# Patient Record
Sex: Female | Born: 1990
Health system: Southern US, Community
[De-identification: ages and names within clinical notes are randomized; demographics above are authoritative.]

## PROBLEM LIST (undated history)

## (undated) DIAGNOSIS — G8929 Other chronic pain: Secondary | ICD-10-CM

## (undated) DIAGNOSIS — S8010XA Contusion of unspecified lower leg, initial encounter: Secondary | ICD-10-CM

## (undated) DIAGNOSIS — K219 Gastro-esophageal reflux disease without esophagitis: Secondary | ICD-10-CM

## (undated) DIAGNOSIS — N926 Irregular menstruation, unspecified: Secondary | ICD-10-CM

## (undated) DIAGNOSIS — F319 Bipolar disorder, unspecified: Secondary | ICD-10-CM

## (undated) DIAGNOSIS — J45909 Unspecified asthma, uncomplicated: Secondary | ICD-10-CM

## (undated) DIAGNOSIS — Z3169 Encounter for other general counseling and advice on procreation: Secondary | ICD-10-CM

## (undated) DIAGNOSIS — A749 Chlamydial infection, unspecified: Secondary | ICD-10-CM

## (undated) DIAGNOSIS — F411 Generalized anxiety disorder: Secondary | ICD-10-CM

## (undated) DIAGNOSIS — Z309 Encounter for contraceptive management, unspecified: Secondary | ICD-10-CM

## (undated) DIAGNOSIS — G43909 Migraine, unspecified, not intractable, without status migrainosus: Secondary | ICD-10-CM

## (undated) DIAGNOSIS — T7840XA Allergy, unspecified, initial encounter: Secondary | ICD-10-CM

## (undated) DIAGNOSIS — Z973 Presence of spectacles and contact lenses: Secondary | ICD-10-CM

## (undated) DIAGNOSIS — Z8742 Personal history of other diseases of the female genital tract: Secondary | ICD-10-CM

## (undated) DIAGNOSIS — N879 Dysplasia of cervix uteri, unspecified: Secondary | ICD-10-CM

## (undated) DIAGNOSIS — S93402A Sprain of unspecified ligament of left ankle, initial encounter: Secondary | ICD-10-CM

## (undated) DIAGNOSIS — L739 Follicular disorder, unspecified: Secondary | ICD-10-CM

## (undated) DIAGNOSIS — E559 Vitamin D deficiency, unspecified: Secondary | ICD-10-CM

## (undated) DIAGNOSIS — E282 Polycystic ovarian syndrome: Secondary | ICD-10-CM

## (undated) DIAGNOSIS — R109 Unspecified abdominal pain: Secondary | ICD-10-CM

## (undated) DIAGNOSIS — M542 Cervicalgia: Secondary | ICD-10-CM

## (undated) DIAGNOSIS — M545 Low back pain: Secondary | ICD-10-CM

## (undated) DIAGNOSIS — B019 Varicella without complication: Secondary | ICD-10-CM

## (undated) DIAGNOSIS — L309 Dermatitis, unspecified: Secondary | ICD-10-CM

## (undated) DIAGNOSIS — H01139 Eczematous dermatitis of unspecified eye, unspecified eyelid: Secondary | ICD-10-CM

## (undated) DIAGNOSIS — G44221 Chronic tension-type headache, intractable: Secondary | ICD-10-CM

## (undated) DIAGNOSIS — G43009 Migraine without aura, not intractable, without status migrainosus: Secondary | ICD-10-CM

## (undated) DIAGNOSIS — L709 Acne, unspecified: Secondary | ICD-10-CM

## (undated) DIAGNOSIS — O24419 Gestational diabetes mellitus in pregnancy, unspecified control: Secondary | ICD-10-CM

## (undated) DIAGNOSIS — E785 Hyperlipidemia, unspecified: Secondary | ICD-10-CM

## (undated) DIAGNOSIS — J4520 Mild intermittent asthma, uncomplicated: Secondary | ICD-10-CM

## (undated) DIAGNOSIS — N83201 Unspecified ovarian cyst, right side: Secondary | ICD-10-CM

## (undated) DIAGNOSIS — M549 Dorsalgia, unspecified: Secondary | ICD-10-CM

## (undated) DIAGNOSIS — M25561 Pain in right knee: Secondary | ICD-10-CM

## (undated) DIAGNOSIS — K581 Irritable bowel syndrome with constipation: Secondary | ICD-10-CM

## (undated) DIAGNOSIS — Z8619 Personal history of other infectious and parasitic diseases: Secondary | ICD-10-CM

## (undated) DIAGNOSIS — Z8632 Personal history of gestational diabetes: Secondary | ICD-10-CM

## (undated) DIAGNOSIS — G4733 Obstructive sleep apnea (adult) (pediatric): Secondary | ICD-10-CM

## (undated) HISTORY — DX: Cervicalgia: M54.2

## (undated) HISTORY — DX: Encounter for contraceptive management, unspecified: Z30.9

## (undated) HISTORY — DX: Low back pain: M54.5

## (undated) HISTORY — DX: Follicular disorder, unspecified: L73.9

## (undated) HISTORY — DX: Acne, unspecified: L70.9

## (undated) HISTORY — DX: Varicella without complication: B01.9

## (undated) HISTORY — DX: Unspecified abdominal pain: R10.9

## (undated) HISTORY — DX: Sprain of unspecified ligament of left ankle, initial encounter: S93.402A

## (undated) HISTORY — DX: Dysplasia of cervix uteri, unspecified: N87.9

## (undated) HISTORY — DX: Bipolar disorder, unspecified: F31.9

## (undated) HISTORY — DX: Dermatitis, unspecified: L30.9

## (undated) HISTORY — DX: Hyperlipidemia, unspecified: E78.5

## (undated) HISTORY — PX: NO PAST SURGERIES: SHX2092

## (undated) HISTORY — DX: Irregular menstruation, unspecified: N92.6

## (undated) HISTORY — DX: Dorsalgia, unspecified: M54.9

## (undated) HISTORY — DX: Encounter for other general counseling and advice on procreation: Z31.69

## (undated) HISTORY — DX: Eczematous dermatitis of unspecified eye, unspecified eyelid: H01.139

## (undated) HISTORY — DX: Allergy, unspecified, initial encounter: T78.40XA

## (undated) HISTORY — PX: KNEE ARTHROSCOPY: SUR90

## (undated) HISTORY — DX: Pain in right knee: M25.561

## (undated) HISTORY — DX: Contusion of unspecified lower leg, initial encounter: S80.10XA

## (undated) HISTORY — DX: Vitamin D deficiency, unspecified: E55.9

---
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
## 2009-03-19 LAB — HM PAP SMEAR

## 2009-12-20 ENCOUNTER — Ambulatory Visit: Payer: Self-pay | Admitting: Emergency Medicine

## 2009-12-20 LAB — CONVERTED CEMR LAB
Beta hcg, urine, semiquantitative: NEGATIVE
Glucose, Urine, Semiquant: NEGATIVE
Ketones, urine, test strip: NEGATIVE
Protein, U semiquant: NEGATIVE
Specific Gravity, Urine: 1.025
Urobilinogen, UA: 0.2

## 2009-12-21 ENCOUNTER — Encounter: Payer: Self-pay | Admitting: Emergency Medicine

## 2010-04-18 NOTE — Assessment & Plan Note (Signed)
Summary: POSSIBLE UTI?   Vital Signs:  Patient Profile:   20 Years Old Female CC:      abdominal cramps, dysuria x 2 days LMP:     11/16/2009 Height:     65 inches Weight:      154 pounds O2 Sat:      99 % O2 treatment:    Room Air Temp:     98.4 degrees F oral Pulse rate:   74 / minute Resp:     14 per minute BP sitting:   106 / 70  (left arm) Cuff size:   regular  Vitals Entered By: Lajean Saver RN (December 20, 2009 10:11 AM)  Menstrual History: LMP (date): 11/16/2009                  Updated Prior Medication List: LAMICTAL 150 MG TABS (LAMOTRIGINE) once daily DEPO-PROVERA 150 MG/ML SUSP (MEDROXYPROGESTERONE ACETATE)   Current Allergies: No known allergies History of Present Illness History from: patient Chief Complaint: abdominal cramps, dysuria x 2 days History of Present Illness: Patient complains of UTI symptoms for 2 days.  She describes the pain as burning during urination.  She has not used any OTC meds. + history of kidney stones. + dysuria + frequency No urgency No hematuria No vaginal discharge No fever/chills + lower abdomenal pain No back pain No fatigue   REVIEW OF SYSTEMS Constitutional Symptoms      Denies fever, chills, night sweats, weight loss, weight gain, and fatigue.  Eyes       Denies change in vision, eye pain, eye discharge, glasses, contact lenses, and eye surgery. Ear/Nose/Throat/Mouth       Denies hearing loss/aids, change in hearing, ear pain, ear discharge, dizziness, frequent runny nose, frequent nose bleeds, sinus problems, sore throat, hoarseness, and tooth pain or bleeding.  Respiratory       Denies dry cough, productive cough, wheezing, shortness of breath, asthma, bronchitis, and emphysema/COPD.  Cardiovascular       Denies murmurs, chest pain, and tires easily with exhertion.    Gastrointestinal       Complains of stomach pain.      Denies nausea/vomiting, diarrhea, constipation, blood in bowel movements, and  indigestion.      Comments: cramping Genitourniary       Complains of painful urination.      Denies blood or discharge from vagina, kidney stones, and loss of urinary control. Neurological       Denies paralysis, seizures, and fainting/blackouts. Musculoskeletal       Denies muscle pain, joint pain, joint stiffness, decreased range of motion, redness, swelling, muscle weakness, and gout.  Skin       Denies bruising, unusual mles/lumps or sores, and hair/skin or nail changes.  Psych       Denies mood changes, temper/anger issues, anxiety/stress, speech problems, depression, and sleep problems.  Past History:  Past Medical History: Unremarkable  Past Surgical History: Denies surgical history  Family History: None  Social History: Current Smoker 1/2 PPD Alcohol use-no Drug use-no Smoking Status:  current Drug Use:  no Physical Exam General appearance: well developed, well nourished, no acute distress Heart: regular rate and  rhythm, no murmur Back: no CVAT Skin: no obvious rashes or lesions MSE: oriented to time, place, and person Assessment New Problems: URINARY TRACT INFECTION (ICD-599.0)   Patient Education: Patient and/or caregiver instructed in the following: rest, fluids.  Plan New Medications/Changes: PHENAZOPYRIDINE HCL 200 MG TABS (PHENAZOPYRIDINE HCL) 1 tab three  times a day for 2 days as needed for burning  #6 x 0, 12/20/2009, Hoyt Koch MD BACTRIM DS 800-160 MG TABS (SULFAMETHOXAZOLE-TRIMETHOPRIM) 1 tab by mouth two times a day for 5 days  #10 x 0, 12/20/2009, Hoyt Koch MD  New Orders: New Patient Level III [phone] UA Dipstick w/o Micro (automated)  [81003] T-Culture, Urine [[id number]] Urine Pregnancy Test  [81025] Planning Comments:   Wipe front to back Follow-up with your primary care physician if not improving or if getting worse   The patient and/or caregiver has been counseled thoroughly with regard to medications prescribed  including dosage, schedule, interactions, rationale for use, and possible side effects and they verbalize understanding.  Diagnoses and expected course of recovery discussed and will return if not improved as expected or if the condition worsens. Patient and/or caregiver verbalized understanding.  Prescriptions: PHENAZOPYRIDINE HCL 200 MG TABS (PHENAZOPYRIDINE HCL) 1 tab three times a day for 2 days as needed for burning  #6 x 0   Entered and Authorized by:   Hoyt Koch MD   Signed by:   Hoyt Koch MD on 12/20/2009   Method used:   Print then Give to Patient   RxID:   [id number] BACTRIM DS 800-160 MG TABS (SULFAMETHOXAZOLE-TRIMETHOPRIM) 1 tab by mouth two times a day for 5 days  #10 x 0   Entered and Authorized by:   Hoyt Koch MD   Signed by:   Hoyt Koch MD on 12/20/2009   Method used:   Print then Give to Patient   RxID:   [id number]   Orders Added: 1)  New Patient Level III [42595] 2)  UA Dipstick w/o Micro (automated)  [81003] 3)  T-Culture, Urine [[id number]] 4)  Urine Pregnancy Test  [81025]  Laboratory Results   Urine Tests  Date/Time Received: December 20, 2009 10:25 AM  Date/Time Reported: December 20, 2009 10:25 AM   Routine Urinalysis   Color: yellow Appearance: Cloudy Glucose: negative   (Normal Range: Negative) Bilirubin: negative   (Normal Range: Negative) Ketone: negative   (Normal Range: Negative) Spec. Gravity: 1.025   (Normal Range: 1.003-1.035) Blood: negative   (Normal Range: Negative) pH: 6.5   (Normal Range: 5.0-8.0) Protein: negative   (Normal Range: Negative) Urobilinogen: 0.2   (Normal Range: 0-1) Nitrite: negative   (Normal Range: Negative) Leukocyte Esterace: trace   (Normal Range: Negative)    Urine HCG: negative

## 2010-06-30 ENCOUNTER — Encounter: Payer: Self-pay | Admitting: Family Medicine

## 2010-07-05 ENCOUNTER — Ambulatory Visit (INDEPENDENT_AMBULATORY_CARE_PROVIDER_SITE_OTHER): Payer: BC Managed Care – PPO | Admitting: Family Medicine

## 2010-07-05 ENCOUNTER — Encounter: Payer: Self-pay | Admitting: Family Medicine

## 2010-07-05 DIAGNOSIS — Z23 Encounter for immunization: Secondary | ICD-10-CM

## 2010-07-05 DIAGNOSIS — F319 Bipolar disorder, unspecified: Secondary | ICD-10-CM

## 2010-07-05 DIAGNOSIS — F17200 Nicotine dependence, unspecified, uncomplicated: Secondary | ICD-10-CM

## 2010-07-05 DIAGNOSIS — Z720 Tobacco use: Secondary | ICD-10-CM

## 2010-07-05 MED ORDER — BUPROPION HCL ER (SR) 150 MG PO TB12: 150.0000 mg | ORAL_TABLET | Freq: Two times a day (BID) | ORAL | Status: DC

## 2010-07-05 MED ORDER — LAMOTRIGINE 150 MG PO TABS: 150.0000 mg | ORAL_TABLET | Freq: Every day | ORAL | Status: DC

## 2010-07-05 NOTE — Assessment & Plan Note (Signed)
Discussed will fill her meds until her appt in August with Dr. Christell Constant.

## 2010-07-05 NOTE — Assessment & Plan Note (Addendum)
Discussed options of Chantix and wellbutrin and nicotine replacment. I would like to start with welbutrin as the Chantix could affect her mood since has dx of Bipolar.  Also discusserd weaning down on her cisgs the first 2 weeks and then setting her quit date.  F/u in 2 months.

## 2010-07-05 NOTE — Progress Notes (Signed)
  Subjective:    Patient ID: Jillian Reyes, female    DOB: Jan 08, 1991, 19 y.o.   MRN: [id number]  HPI Here to estab care. Dx of Bipolar since age 49. Well controlled on Lamictal.  Doing well here. Moved here in August from [place].  She has an apt sched with psych in August but needs her med until then. Her dose was recently inc from 100 to 150.  She would like to quit smoking and discussed potential mes to help her. She is not really interested in ntcotine replacment.      Review of Systems     Objective:   Physical Exam  Constitutional: She is oriented to person, place, and time. She appears well-developed and well-nourished.  HENT:  Head: Normocephalic and atraumatic.  Eyes: Conjunctivae and EOM are normal. Pupils are equal, round, and reactive to light.  Cardiovascular: Normal rate, regular rhythm and normal heart sounds.   Pulmonary/Chest: Effort normal and breath sounds normal.  Neurological: She is alert and oriented to person, place, and time.          Assessment & Plan:

## 2010-09-05 ENCOUNTER — Ambulatory Visit (HOSPITAL_COMMUNITY): Payer: Self-pay | Admitting: Physician Assistant

## 2010-09-06 ENCOUNTER — Ambulatory Visit: Payer: BC Managed Care – PPO | Admitting: Family Medicine

## 2010-09-12 ENCOUNTER — Ambulatory Visit: Payer: BC Managed Care – PPO

## 2010-09-18 ENCOUNTER — Ambulatory Visit: Payer: BC Managed Care – PPO

## 2010-09-18 ENCOUNTER — Ambulatory Visit (INDEPENDENT_AMBULATORY_CARE_PROVIDER_SITE_OTHER): Payer: BC Managed Care – PPO | Admitting: Family Medicine

## 2010-09-18 DIAGNOSIS — Z309 Encounter for contraceptive management, unspecified: Secondary | ICD-10-CM

## 2010-09-18 MED ORDER — MEDROXYPROGESTERONE ACETATE 150 MG/ML IM SUSP
150.0000 mg | INTRAMUSCULAR | Status: DC
  Administered 2010-09-18: 150 mg via INTRAMUSCULAR

## 2010-09-18 NOTE — Progress Notes (Signed)
  Subjective:    Patient ID: Jillian Reyes, female    DOB: [birth year], 20 y.o.   MRN: [id number]  HPI  Here for Depo -Provera.   Review of Systems     Objective:   Physical Exam        Assessment & Plan:

## 2010-09-19 ENCOUNTER — Ambulatory Visit (INDEPENDENT_AMBULATORY_CARE_PROVIDER_SITE_OTHER): Payer: BC Managed Care – PPO | Admitting: Physician Assistant

## 2010-09-19 DIAGNOSIS — F1994 Other psychoactive substance use, unspecified with psychoactive substance-induced mood disorder: Secondary | ICD-10-CM

## 2010-09-27 ENCOUNTER — Ambulatory Visit (HOSPITAL_COMMUNITY): Payer: BC Managed Care – PPO | Admitting: Psychology

## 2010-10-10 ENCOUNTER — Encounter (INDEPENDENT_AMBULATORY_CARE_PROVIDER_SITE_OTHER): Payer: BC Managed Care – PPO | Admitting: Physician Assistant

## 2010-10-10 ENCOUNTER — Encounter (HOSPITAL_COMMUNITY): Payer: BC Managed Care – PPO | Admitting: Physician Assistant

## 2010-10-10 DIAGNOSIS — F339 Major depressive disorder, recurrent, unspecified: Secondary | ICD-10-CM

## 2010-11-14 ENCOUNTER — Ambulatory Visit (HOSPITAL_COMMUNITY): Payer: Self-pay | Admitting: Psychiatry

## 2010-11-15 ENCOUNTER — Encounter (HOSPITAL_COMMUNITY): Payer: BC Managed Care – PPO | Admitting: Physician Assistant

## 2010-12-08 ENCOUNTER — Ambulatory Visit: Payer: BC Managed Care – PPO

## 2010-12-22 ENCOUNTER — Ambulatory Visit (INDEPENDENT_AMBULATORY_CARE_PROVIDER_SITE_OTHER): Payer: BC Managed Care – PPO | Admitting: Family Medicine

## 2010-12-22 ENCOUNTER — Encounter: Payer: Self-pay | Admitting: Family Medicine

## 2010-12-22 ENCOUNTER — Telehealth: Payer: Self-pay | Admitting: Family Medicine

## 2010-12-22 VITALS — BP 110/74 | HR 91 | Wt 150.0 lb

## 2010-12-22 DIAGNOSIS — Z3009 Encounter for other general counseling and advice on contraception: Secondary | ICD-10-CM

## 2010-12-22 DIAGNOSIS — Z23 Encounter for immunization: Secondary | ICD-10-CM

## 2010-12-22 DIAGNOSIS — F319 Bipolar disorder, unspecified: Secondary | ICD-10-CM

## 2010-12-22 MED ORDER — ALPRAZOLAM 2 MG PO TABS: 2.0000 mg | ORAL_TABLET | Freq: Every evening | ORAL | Status: DC | PRN

## 2010-12-22 MED ORDER — DESOGESTREL-ETHINYL ESTRADIOL 0.15-0.02/0.01 MG (21/5) PO TABS: 1.0000 | ORAL_TABLET | Freq: Every day | ORAL | Status: DC

## 2010-12-22 MED ORDER — LAMOTRIGINE 150 MG PO TABS: 150.0000 mg | ORAL_TABLET | Freq: Every day | ORAL | Status: DC

## 2010-12-22 NOTE — Progress Notes (Signed)
  Subjective:    Patient ID: Jillian Reyes, female    DOB: 08-11-1990, 20 y.o.   MRN: [id number]  HPI  Her to get refill on her meds. Ran out 4 days go. Went to see [employer] but says got billed fo now show when she actually went and then when had to cancel her appt they wouldn't refill her meds  She has been on the same meds for a period of time and feels they are working well.Seh want RF on meds.   She has been off depo for the last month and notices her energy is a lot better. She will liek to switch to birth control pills and see if feels better on these.    Review of Systems     Objective:   Physical Exam  Constitutional: She is oriented to person, place, and time. She appears well-developed and well-nourished.  HENT:  Head: Normocephalic and atraumatic.  Cardiovascular: Normal rate, regular rhythm and normal heart sounds.   Pulmonary/Chest: Effort normal and breath sounds normal.  Neurological: She is alert and oriented to person, place, and time.  Skin: Skin is warm and dry.  Psychiatric: She has a normal mood and affect. Her behavior is normal.          Assessment & Plan:  Bipolar D/O - will make new pysch referral. Refilled her meds.   Birth control - will change to a pill. Will start with Kariva.  Call if having any problems.     Flu vaccine given.

## 2010-12-22 NOTE — Telephone Encounter (Signed)
Pharmacist called and said they were having problems receiving the scripts that were sent over electronically this am by the provider.   Plan:  Called verbal order for the kariva, xanex, and the lamictal. Jillian Newcomer, LPN [facility]

## 2011-04-05 ENCOUNTER — Encounter: Payer: Self-pay | Admitting: Family Medicine

## 2011-04-05 ENCOUNTER — Ambulatory Visit (INDEPENDENT_AMBULATORY_CARE_PROVIDER_SITE_OTHER): Payer: BC Managed Care – PPO | Admitting: Family Medicine

## 2011-04-05 ENCOUNTER — Encounter: Payer: Self-pay | Admitting: *Deleted

## 2011-04-05 VITALS — BP 109/65 | HR 98 | Temp 98.8°F | Wt 149.0 lb

## 2011-04-05 DIAGNOSIS — G4700 Insomnia, unspecified: Secondary | ICD-10-CM

## 2011-04-05 DIAGNOSIS — F411 Generalized anxiety disorder: Secondary | ICD-10-CM

## 2011-04-05 DIAGNOSIS — F419 Anxiety disorder, unspecified: Secondary | ICD-10-CM

## 2011-04-05 MED ORDER — LAMOTRIGINE 200 MG PO TABS: 200.0000 mg | ORAL_TABLET | Freq: Every day | ORAL | Status: DC

## 2011-04-05 MED ORDER — HYDROXYZINE PAMOATE 100 MG PO CAPS: 100.0000 mg | ORAL_CAPSULE | Freq: Every day | ORAL | Status: AC

## 2011-04-05 NOTE — Telephone Encounter (Deleted)
Called pt and pentaz/nalox 50.0.5 mg in no longer on her formulary so called pt per Dr. Linford Arnold to see if pt wanted to try Hydrocodone/ ibuprofen and pt is ok with that. Karen Kays

## 2011-04-05 NOTE — Telephone Encounter (Signed)
In error (wrong chart)

## 2011-04-05 NOTE — Progress Notes (Signed)
  Subjective:    Patient ID: Jillian Reyes, female    DOB: Sep 05, 1990, 21 y.o.   MRN: [id number]  HPI Cant eat or sleep. Hasn't sleep well at night. On lamictal for mood. Using the xanax at bedtime and still not helping. REcently lost her job. She has been really stress and has been having panic attacks. She has been taking her lamictal regularly. She is getting fearful to leave her house.     Review of Systems     Objective:   Physical Exam  Constitutional: She is oriented to person, place, and time. She appears well-developed and well-nourished.  HENT:  Head: Normocephalic and atraumatic.  Cardiovascular: Normal rate, regular rhythm and normal heart sounds.   Pulmonary/Chest: Effort normal and breath sounds normal.  Neurological: She is alert and oriented to person, place, and time.  Skin: Skin is warm and dry.  Psychiatric: She has a normal mood and affect. Her behavior is normal.          Assessment & Plan:  Anxiety w/ Dx of Bipolar D/O - her GAD-7 scores 27. She rates it is very difficult. discussed working on getting counseling which I think would be really helpful for her. Patient agreed. Will put in referral. Also increase her Lamictal to 200 mg a day. Followup in 2 weeks. I also added Vistaril at that time that she can take with her Xanax to see who helps with her sleep.  Insomnia -  Followup insomnia as well in 2 weeks.  I also added Vistaril at that time that she can take with her Xanax to see who helps with her sleep.

## 2011-04-17 ENCOUNTER — Telehealth: Payer: Self-pay | Admitting: *Deleted

## 2011-04-17 NOTE — Telephone Encounter (Signed)
You referred pt downstairs for counseling but she has been there before and was not happy with the counseling she received. Pt request a different counseling facility for the referral. Pt was wanting one ASAP so told pt in the meantime while waiting for the new referral if she wanted she could call [facility] in [facility]- gave pt the number.

## 2011-04-19 ENCOUNTER — Ambulatory Visit: Payer: BC Managed Care – PPO | Admitting: Family Medicine

## 2011-04-20 ENCOUNTER — Encounter: Payer: Self-pay | Admitting: Family Medicine

## 2011-04-20 ENCOUNTER — Ambulatory Visit (INDEPENDENT_AMBULATORY_CARE_PROVIDER_SITE_OTHER): Payer: BC Managed Care – PPO | Admitting: Family Medicine

## 2011-04-20 VITALS — BP 102/71 | HR 82 | Temp 98.6°F | Wt 150.0 lb

## 2011-04-20 DIAGNOSIS — F319 Bipolar disorder, unspecified: Secondary | ICD-10-CM

## 2011-04-20 MED ORDER — ALPRAZOLAM 2 MG PO TABS: 2.0000 mg | ORAL_TABLET | Freq: Every evening | ORAL | Status: DC | PRN

## 2011-04-20 NOTE — Progress Notes (Signed)
  Subjective:    Patient ID: Jillian Reyes, female    DOB: 03-17-1991, 21 y.o.   MRN: [id number]  HPI Bipolar disorder-On the lamictal 200mg  and feels it is helping.  Tried to take half a xanax in AM and felt worse so just taking it at night. Vistaril at night has helped. Found a counselor and went once so far on Monday.   She said she was wrongfully fired from her job and has filed a [employer]. She has a meeting with the lawyers in about 2 weeks. She does have some paperwork that she brought in for me to complete for disability, through work.  She says she is also getting over a UTI.  Review of Systems     Objective:   Physical Exam  Constitutional: She is oriented to person, place, and time. She appears well-developed and well-nourished.  HENT:  Head: Normocephalic and atraumatic.  Cardiovascular: Normal rate, regular rhythm and normal heart sounds.   Pulmonary/Chest: Effort normal and breath sounds normal.  Neurological: She is alert and oriented to person, place, and time.  Skin: Skin is warm and dry.  Psychiatric: She has a normal mood and affect. Her behavior is normal.          Assessment & Plan:  GAD- 7 score of form 27 down to 16.  Saw a counselor on Monday.  That is great. I encouraged her to keep going counseling.  Discussed options including increasing her Lamictal again and that she would like to keep it where it is. She just fell she has a lot of stressful things going on right now but she feels like she is actually handling them a little bit better. She can also continue the Xanax at bedtime since it does help her rest and sleep. Followup in one month.   I will be happy to complete the forms but will likely be next week.

## 2011-04-23 ENCOUNTER — Ambulatory Visit: Payer: BC Managed Care – PPO | Admitting: Family Medicine

## 2011-05-01 ENCOUNTER — Ambulatory Visit (INDEPENDENT_AMBULATORY_CARE_PROVIDER_SITE_OTHER): Payer: BC Managed Care – PPO | Admitting: Family Medicine

## 2011-05-01 ENCOUNTER — Encounter: Payer: Self-pay | Admitting: Family Medicine

## 2011-05-01 DIAGNOSIS — F411 Generalized anxiety disorder: Secondary | ICD-10-CM

## 2011-05-01 DIAGNOSIS — F430 Acute stress reaction: Secondary | ICD-10-CM

## 2011-05-01 NOTE — Progress Notes (Signed)
  Subjective:    Patient ID: Jillian Reyes, female    DOB: [birth year], 21 y.o.   MRN: [id number]  HPI Anxiety - Has been really stressful.  Still having problems sleeping. She has started counseling and it has helped. Has a mediation scheduled for later this month.   Feels her meds are working well. Still needs a supporting letter for her documentation. She has good family support.   She is still very fearful of the angry coworker. She is actually dating that coworkers son.  This has made it very complicated. She was supposedly fired for sharing a starting date for another employee. She did not share any additional confidential information.   Review of Systems     Objective:   Physical Exam  Constitutional: She appears well-developed and well-nourished.  Psychiatric: She has a normal mood and affect. Her behavior is normal. Judgment and thought content normal.          Assessment & Plan:  Anxiety - GAD- 7 score of 19.  She is not well controlled but feels this is completely from outside stressors. I think getting counseling is very helpful for her. I am glad she is going regularly. Also hopefully after his mediation she will have some closure and this will help. as well. For now we will continue her current medication regimen. She feels like her baseline bipolar is very well controlled. Will PROVIDE a supporting letter for her. F/U in 6 weeks.

## 2011-05-09 ENCOUNTER — Encounter: Payer: Self-pay | Admitting: Family Medicine

## 2011-05-18 ENCOUNTER — Ambulatory Visit: Payer: BC Managed Care – PPO | Admitting: Family Medicine

## 2011-07-06 ENCOUNTER — Ambulatory Visit (INDEPENDENT_AMBULATORY_CARE_PROVIDER_SITE_OTHER): Payer: Federal, State, Local not specified - PPO | Admitting: Family Medicine

## 2011-07-06 ENCOUNTER — Ambulatory Visit: Payer: BC Managed Care – PPO | Admitting: Family Medicine

## 2011-07-06 ENCOUNTER — Encounter: Payer: Self-pay | Admitting: Family Medicine

## 2011-07-06 VITALS — BP 110/74 | HR 88 | Temp 98.1°F | Ht 64.5 in | Wt 158.0 lb

## 2011-07-06 DIAGNOSIS — M545 Low back pain, unspecified: Secondary | ICD-10-CM

## 2011-07-06 DIAGNOSIS — F430 Acute stress reaction: Secondary | ICD-10-CM

## 2011-07-06 DIAGNOSIS — T7840XA Allergy, unspecified, initial encounter: Secondary | ICD-10-CM

## 2011-07-06 DIAGNOSIS — F411 Generalized anxiety disorder: Secondary | ICD-10-CM

## 2011-07-06 DIAGNOSIS — F319 Bipolar disorder, unspecified: Secondary | ICD-10-CM

## 2011-07-06 DIAGNOSIS — N879 Dysplasia of cervix uteri, unspecified: Secondary | ICD-10-CM

## 2011-07-06 DIAGNOSIS — Z309 Encounter for contraceptive management, unspecified: Secondary | ICD-10-CM

## 2011-07-06 DIAGNOSIS — M549 Dorsalgia, unspecified: Secondary | ICD-10-CM

## 2011-07-06 DIAGNOSIS — R87619 Unspecified abnormal cytological findings in specimens from cervix uteri: Secondary | ICD-10-CM | POA: Insufficient documentation

## 2011-07-06 DIAGNOSIS — F419 Anxiety disorder, unspecified: Secondary | ICD-10-CM

## 2011-07-06 DIAGNOSIS — G4700 Insomnia, unspecified: Secondary | ICD-10-CM

## 2011-07-06 DIAGNOSIS — Z720 Tobacco use: Secondary | ICD-10-CM

## 2011-07-06 DIAGNOSIS — F17200 Nicotine dependence, unspecified, uncomplicated: Secondary | ICD-10-CM

## 2011-07-06 DIAGNOSIS — Z8632 Personal history of gestational diabetes: Secondary | ICD-10-CM | POA: Insufficient documentation

## 2011-07-06 DIAGNOSIS — R4589 Other symptoms and signs involving emotional state: Secondary | ICD-10-CM

## 2011-07-06 DIAGNOSIS — R300 Dysuria: Secondary | ICD-10-CM

## 2011-07-06 LAB — POCT URINALYSIS DIPSTICK
Ketones, UA: NEGATIVE
Leukocytes, UA: NEGATIVE
Protein, UA: NEGATIVE
Spec Grav, UA: >=1.03
pH, UA: 5.5

## 2011-07-06 MED ORDER — DESOGESTREL-ETHINYL ESTRADIOL 0.15-0.02/0.01 MG (21/5) PO TABS: 1.0000 | ORAL_TABLET | Freq: Every day | ORAL | Status: DC

## 2011-07-06 MED ORDER — CYCLOBENZAPRINE HCL 10 MG PO TABS: 10.0000 mg | ORAL_TABLET | Freq: Three times a day (TID) | ORAL | Status: AC | PRN

## 2011-07-06 MED ORDER — NAPROXEN 375 MG PO TABS: 375.0000 mg | ORAL_TABLET | Freq: Two times a day (BID) | ORAL | Status: DC

## 2011-07-06 MED ORDER — CETIRIZINE HCL 10 MG PO TABS: 10.0000 mg | ORAL_TABLET | Freq: Two times a day (BID) | ORAL | Status: DC | PRN

## 2011-07-06 MED ORDER — LAMOTRIGINE 150 MG PO TABS: 150.0000 mg | ORAL_TABLET | Freq: Every day | ORAL | Status: DC

## 2011-07-06 MED ORDER — ALPRAZOLAM 2 MG PO TABS: 2.0000 mg | ORAL_TABLET | Freq: Every evening | ORAL | Status: DC | PRN

## 2011-07-06 NOTE — Patient Instructions (Signed)
Preventive Care for Adults, Female A healthy lifestyle and preventive care can promote health and wellness. Preventive health guidelines for women include the following key practices.  A routine yearly physical is a good way to check with your caregiver about your health and preventive screening. It is a chance to share any concerns and updates on your health, and to receive a thorough exam.   Visit your dentist for a routine exam and preventive care every 6 months. Brush your teeth twice a day and floss once a day. Good oral hygiene prevents tooth decay and gum disease.   The frequency of eye exams is based on your age, health, family medical history, use of contact lenses, and other factors. Follow your caregiver's recommendations for frequency of eye exams.   Eat a healthy diet. Foods like vegetables, fruits, whole grains, low-fat dairy products, and lean protein foods contain the nutrients you need without too many calories. Decrease your intake of foods high in solid fats, added sugars, and salt. Eat the right amount of calories for you.Get information about a proper diet from your caregiver, if necessary.   Regular physical exercise is one of the most important things you can do for your health. Most adults should get at least 150 minutes of moderate-intensity exercise (any activity that increases your heart rate and causes you to sweat) each week. In addition, most adults need muscle-strengthening exercises on 2 or more days a week.   Maintain a healthy weight. The body mass index (BMI) is a screening tool to identify possible weight problems. It provides an estimate of body fat based on height and weight. Your caregiver can help determine your BMI, and can help you achieve or maintain a healthy weight.For adults 20 years and older:   A BMI below 18.5 is considered underweight.   A BMI of 18.5 to 24.9 is normal.   A BMI of 25 to 29.9 is considered overweight.   A BMI of 30 and above is  considered obese.   Maintain normal blood lipids and cholesterol levels by exercising and minimizing your intake of saturated fat. Eat a balanced diet with plenty of fruit and vegetables. Blood tests for lipids and cholesterol should begin at age 20 and be repeated every 5 years. If your lipid or cholesterol levels are high, you are over 50, or you are at high risk for heart disease, you may need your cholesterol levels checked more frequently.Ongoing high lipid and cholesterol levels should be treated with medicines if diet and exercise are not effective.   If you smoke, find out from your caregiver how to quit. If you do not use tobacco, do not start.   If you are pregnant, do not drink alcohol. If you are breastfeeding, be very cautious about drinking alcohol. If you are not pregnant and choose to drink alcohol, do not exceed 1 drink per day. One drink is considered to be 12 ounces (355 mL) of beer, 5 ounces (148 mL) of wine, or 1.5 ounces (44 mL) of liquor.   Avoid use of street drugs. Do not share needles with anyone. Ask for help if you need support or instructions about stopping the use of drugs.   High blood pressure causes heart disease and increases the risk of stroke. Your blood pressure should be checked at least every 1 to 2 years. Ongoing high blood pressure should be treated with medicines if weight loss and exercise are not effective.   If you are 55 to 22   years old, ask your caregiver if you should take aspirin to prevent strokes.   Diabetes screening involves taking a blood sample to check your fasting blood sugar level. This should be done once every 3 years, after age 45, if you are within normal weight and without risk factors for diabetes. Testing should be considered at a younger age or be carried out more frequently if you are overweight and have at least 1 risk factor for diabetes.   Breast cancer screening is essential preventive care for women. You should practice "breast  self-awareness." This means understanding the normal appearance and feel of your breasts and may include breast self-examination. Any changes detected, no matter how small, should be reported to a caregiver. Women in their 20s and 30s should have a clinical breast exam (CBE) by a caregiver as part of a regular health exam every 1 to 3 years. After age 40, women should have a CBE every year. Starting at age 40, women should consider having a mammography (breast X-ray test) every year. Women who have a family history of breast cancer should talk to their caregiver about genetic screening. Women at a high risk of breast cancer should talk to their caregivers about having magnetic resonance imaging (MRI) and a mammography every year.   The Pap test is a screening test for cervical cancer. A Pap test can show cell changes on the cervix that might become cervical cancer if left untreated. A Pap test is a procedure in which cells are obtained and examined from the lower end of the uterus (cervix).   Women should have a Pap test starting at age 21.   Between ages 21 and 29, Pap tests should be repeated every 2 years.   Beginning at age 30, you should have a Pap test every 3 years as long as the past 3 Pap tests have been normal.   Some women have medical problems that increase the chance of getting cervical cancer. Talk to your caregiver about these problems. It is especially important to talk to your caregiver if a new problem develops soon after your last Pap test. In these cases, your caregiver may recommend more frequent screening and Pap tests.   The above recommendations are the same for women who have or have not gotten the vaccine for human papillomavirus (HPV).   If you had a hysterectomy for a problem that was not cancer or a condition that could lead to cancer, then you no longer need Pap tests. Even if you no longer need a Pap test, a regular exam is a good idea to make sure no other problems are  starting.   If you are between ages 65 and 70, and you have had normal Pap tests going back 10 years, you no longer need Pap tests. Even if you no longer need a Pap test, a regular exam is a good idea to make sure no other problems are starting.   If you have had past treatment for cervical cancer or a condition that could lead to cancer, you need Pap tests and screening for cancer for at least 20 years after your treatment.   If Pap tests have been discontinued, risk factors (such as a new sexual partner) need to be reassessed to determine if screening should be resumed.   The HPV test is an additional test that may be used for cervical cancer screening. The HPV test looks for the virus that can cause the cell changes on the cervix.   The cells collected during the Pap test can be tested for HPV. The HPV test could be used to screen women aged 30 years and older, and should be used in women of any age who have unclear Pap test results. After the age of 30, women should have HPV testing at the same frequency as a Pap test.   Colorectal cancer can be detected and often prevented. Most routine colorectal cancer screening begins at the age of 50 and continues through age 75. However, your caregiver may recommend screening at an earlier age if you have risk factors for colon cancer. On a yearly basis, your caregiver may provide home test kits to check for hidden blood in the stool. Use of a small camera at the end of a tube, to directly examine the colon (sigmoidoscopy or colonoscopy), can detect the earliest forms of colorectal cancer. Talk to your caregiver about this at age 50, when routine screening begins. Direct examination of the colon should be repeated every 5 to 10 years through age 75, unless early forms of pre-cancerous polyps or small growths are found.   Hepatitis C blood testing is recommended for all people born from 1945 through 1965 and any individual with known risks for hepatitis C.    Practice safe sex. Use condoms and avoid high-risk sexual practices to reduce the spread of sexually transmitted infections (STIs). STIs include gonorrhea, chlamydia, syphilis, trichomonas, herpes, HPV, and human immunodeficiency virus (HIV). Herpes, HIV, and HPV are viral illnesses that have no cure. They can result in disability, cancer, and death. Sexually active women aged 25 and younger should be checked for chlamydia. Older women with new or multiple partners should also be tested for chlamydia. Testing for other STIs is recommended if you are sexually active and at increased risk.   Osteoporosis is a disease in which the bones lose minerals and strength with aging. This can result in serious bone fractures. The risk of osteoporosis can be identified using a bone density scan. Women ages 65 and over and women at risk for fractures or osteoporosis should discuss screening with their caregivers. Ask your caregiver whether you should take a calcium supplement or vitamin D to reduce the rate of osteoporosis.   Menopause can be associated with physical symptoms and risks. Hormone replacement therapy is available to decrease symptoms and risks. You should talk to your caregiver about whether hormone replacement therapy is right for you.   Use sunscreen with sun protection factor (SPF) of 30 or more. Apply sunscreen liberally and repeatedly throughout the day. You should seek shade when your shadow is shorter than you. Protect yourself by wearing long sleeves, pants, a wide-brimmed hat, and sunglasses year round, whenever you are outdoors.   Once a month, do a whole body skin exam, using a mirror to look at the skin on your back. Notify your caregiver of new moles, moles that have irregular borders, moles that are larger than a pencil eraser, or moles that have changed in shape or color.   Stay current with required immunizations.   Influenza. You need a dose every fall (or winter). The composition of  the flu vaccine changes each year, so being vaccinated once is not enough.   Pneumococcal polysaccharide. You need 1 to 2 doses if you smoke cigarettes or if you have certain chronic medical conditions. You need 1 dose at age 65 (or older) if you have never been vaccinated.   Tetanus, diphtheria, pertussis (Tdap, Td). Get 1 dose of   Tdap vaccine if you are younger than age 65, are over 65 and have contact with an infant, are a healthcare worker, are pregnant, or simply want to be protected from whooping cough. After that, you need a Td booster dose every 10 years. Consult your caregiver if you have not had at least 3 tetanus and diphtheria-containing shots sometime in your life or have a deep or dirty wound.   HPV. You need this vaccine if you are a woman age 26 or younger. The vaccine is given in 3 doses over 6 months.   Measles, mumps, rubella (MMR). You need at least 1 dose of MMR if you were born in 1957 or later. You may also need a second dose.   Meningococcal. If you are age 19 to 21 and a first-year college student living in a residence hall, or have one of several medical conditions, you need to get vaccinated against meningococcal disease. You may also need additional booster doses.   Zoster (shingles). If you are age 60 or older, you should get this vaccine.   Varicella (chickenpox). If you have never had chickenpox or you were vaccinated but received only 1 dose, talk to your caregiver to find out if you need this vaccine.   Hepatitis A. You need this vaccine if you have a specific risk factor for hepatitis A virus infection or you simply wish to be protected from this disease. The vaccine is usually given as 2 doses, 6 to 18 months apart.   Hepatitis B. You need this vaccine if you have a specific risk factor for hepatitis B virus infection or you simply wish to be protected from this disease. The vaccine is given in 3 doses, usually over 6 months.  Preventive Services /  Frequency Ages 19 to 39  Blood pressure check.** / Every 1 to 2 years.   Lipid and cholesterol check.** / Every 5 years beginning at age 20.   Clinical breast exam.** / Every 3 years for women in their 20s and 30s.   Pap test.** / Every 2 years from ages 21 through 29. Every 3 years starting at age 30 through age 65 or 70 with a history of 3 consecutive normal Pap tests.   HPV screening.** / Every 3 years from ages 30 through ages 65 to 70 with a history of 3 consecutive normal Pap tests.   Hepatitis C blood test.** / For any individual with known risks for hepatitis C.   Skin self-exam. / Monthly.   Influenza immunization.** / Every year.   Pneumococcal polysaccharide immunization.** / 1 to 2 doses if you smoke cigarettes or if you have certain chronic medical conditions.   Tetanus, diphtheria, pertussis (Tdap, Td) immunization. / A one-time dose of Tdap vaccine. After that, you need a Td booster dose every 10 years.   HPV immunization. / 3 doses over 6 months, if you are 26 and younger.   Measles, mumps, rubella (MMR) immunization. / You need at least 1 dose of MMR if you were born in 1957 or later. You may also need a second dose.   Meningococcal immunization. / 1 dose if you are age 19 to 21 and a first-year college student living in a residence hall, or have one of several medical conditions, you need to get vaccinated against meningococcal disease. You may also need additional booster doses.   Varicella immunization.** / Consult your caregiver.   Hepatitis A immunization.** / Consult your caregiver. 2 doses, 6 to 18 months   apart.   Hepatitis B immunization.** / Consult your caregiver. 3 doses usually over 6 months.  Ages 40 to 64  Blood pressure check.** / Every 1 to 2 years.   Lipid and cholesterol check.** / Every 5 years beginning at age 20.   Clinical breast exam.** / Every year after age 40.   Mammogram.** / Every year beginning at age 40 and continuing for as  long as you are in good health. Consult with your caregiver.   Pap test.** / Every 3 years starting at age 30 through age 65 or 70 with a history of 3 consecutive normal Pap tests.   HPV screening.** / Every 3 years from ages 30 through ages 65 to 70 with a history of 3 consecutive normal Pap tests.   Fecal occult blood test (FOBT) of stool. / Every year beginning at age 50 and continuing until age 75. You may not need to do this test if you get a colonoscopy every 10 years.   Flexible sigmoidoscopy or colonoscopy.** / Every 5 years for a flexible sigmoidoscopy or every 10 years for a colonoscopy beginning at age 50 and continuing until age 75.   Hepatitis C blood test.** / For all people born from 1945 through 1965 and any individual with known risks for hepatitis C.   Skin self-exam. / Monthly.   Influenza immunization.** / Every year.   Pneumococcal polysaccharide immunization.** / 1 to 2 doses if you smoke cigarettes or if you have certain chronic medical conditions.   Tetanus, diphtheria, pertussis (Tdap, Td) immunization.** / A one-time dose of Tdap vaccine. After that, you need a Td booster dose every 10 years.   Measles, mumps, rubella (MMR) immunization. / You need at least 1 dose of MMR if you were born in 1957 or later. You may also need a second dose.   Varicella immunization.** / Consult your caregiver.   Meningococcal immunization.** / Consult your caregiver.   Hepatitis A immunization.** / Consult your caregiver. 2 doses, 6 to 18 months apart.   Hepatitis B immunization.** / Consult your caregiver. 3 doses, usually over 6 months.  Ages 65 and over  Blood pressure check.** / Every 1 to 2 years.   Lipid and cholesterol check.** / Every 5 years beginning at age 20.   Clinical breast exam.** / Every year after age 40.   Mammogram.** / Every year beginning at age 40 and continuing for as long as you are in good health. Consult with your caregiver.   Pap test.** /  Every 3 years starting at age 30 through age 65 or 70 with a 3 consecutive normal Pap tests. Testing can be stopped between 65 and 70 with 3 consecutive normal Pap tests and no abnormal Pap or HPV tests in the past 10 years.   HPV screening.** / Every 3 years from ages 30 through ages 65 or 70 with a history of 3 consecutive normal Pap tests. Testing can be stopped between 65 and 70 with 3 consecutive normal Pap tests and no abnormal Pap or HPV tests in the past 10 years.   Fecal occult blood test (FOBT) of stool. / Every year beginning at age 50 and continuing until age 75. You may not need to do this test if you get a colonoscopy every 10 years.   Flexible sigmoidoscopy or colonoscopy.** / Every 5 years for a flexible sigmoidoscopy or every 10 years for a colonoscopy beginning at age 50 and continuing until age 75.   Hepatitis   C blood test.** / For all people born from 1945 through 1965 and any individual with known risks for hepatitis C.   Osteoporosis screening.** / A one-time screening for women ages 65 and over and women at risk for fractures or osteoporosis.   Skin self-exam. / Monthly.   Influenza immunization.** / Every year.   Pneumococcal polysaccharide immunization.** / 1 dose at age 65 (or older) if you have never been vaccinated.   Tetanus, diphtheria, pertussis (Tdap, Td) immunization. / A one-time dose of Tdap vaccine if you are over 65 and have contact with an infant, are a healthcare worker, or simply want to be protected from whooping cough. After that, you need a Td booster dose every 10 years.   Varicella immunization.** / Consult your caregiver.   Meningococcal immunization.** / Consult your caregiver.   Hepatitis A immunization.** / Consult your caregiver. 2 doses, 6 to 18 months apart.   Hepatitis B immunization.** / Check with your caregiver. 3 doses, usually over 6 months.  ** Family history and personal history of risk and conditions may change your caregiver's  recommendations. Document Released: 05/01/2001 Document Revised: 02/22/2011 Document Reviewed: 07/31/2010 [employer] Patient Information 2012 [employer]. 

## 2011-07-06 NOTE — Assessment & Plan Note (Signed)
No trouble since pregnancy, will obtain a fasting set of labs to further evaluate prior to next appt

## 2011-07-06 NOTE — Assessment & Plan Note (Addendum)
Reports last pap normal but a number of years ago she had an abnormal pap for which she was sent for colpo but no procedure was ever performed, paps normalized but unfortunately she has not had a pap in nearly 2 years now, agrees to return for pap in 1 month, will request old records. Given info on Gardisil encouraged to start series at next visit

## 2011-07-06 NOTE — Assessment & Plan Note (Signed)
Bad for past 2 days, has had trouble intermittently for past 5 years. Denies any recent trauma, is given Naproxen 375 mg po bid prn and Flexeril prn. Stay active and apply moist heat report if no relief for consideration of steroids

## 2011-07-06 NOTE — Assessment & Plan Note (Signed)
Flared recently encouraged Zyrtec once to twice daily

## 2011-07-06 NOTE — Assessment & Plan Note (Signed)
Encouraged complete cessation. 

## 2011-07-06 NOTE — Assessment & Plan Note (Signed)
Reports this has improved over the last month now that she is no longer at the job that was causing her to feel stressed. Was using Hydroxyzine for sleep and not longer needs it.

## 2011-07-06 NOTE — Assessment & Plan Note (Signed)
Had her Lamictal increased to 200mg  during a stressful period but would like to have it dropped back to 150 mg

## 2011-07-06 NOTE — Progress Notes (Signed)
Patient ID: Jillian Reyes, female   DOB: 12-27-1990, 21 y.o.   MRN: [id number] Jillian Reyes [id number] May 31, 1990 07/06/2011      Progress Note New Patient  Subjective  Chief Complaint  Chief Complaint  Patient presents with  . Establish Care    new patient    HPI  Patient is a 21 year old Caucasian female who is in today for new patient appointment. Her complaint is low-back pain. She's been having trouble off and on with her low back for years. Reports having had an epidural 5 years ago and has had intermittent low back since. Has not had any trouble recently until this week. She is in mild low-back pain earlier in the week and then yesterday began to have difficulty standing up. The pain is primarily in the center of the back but does radiate to the left posterior hip slightly more than the right posterior hip. No radicular symptoms. No numbness tingling weakness of the legs. No bladder or bowel incontinence. No other recent illness, fevers, chills, chest pain, palpitations, GI or GU complaints. She does have seasonal allergies and uses Zyrtec as needed. She previously has been on Depo-Provera like to switch to an oral contraceptive at this time. Was receiving her Pap smears from [facility] I believe she has lost about 2 years ago. Hasn't history of some abnormal Paps for which they did a colostomy but never had to do any leak or cry out. She denies any discharge or concerns at this time. Aleve she has not had cortisone shots thus far  Past Medical History  Diagnosis Date  . Bipolar 1 disorder   . Chicken pox as a child  . Diabetes mellitus     gestational   . Back pain, lumbosacral   . Allergic state   . Abnormal cells of cervix     No past surgical history on file.  Family History  Problem Relation Age of Onset  . Heart disease    . Diabetes Father 57    type 2  . Thyroid disease Father   . Hyperlipidemia      great grandparents  . Migraines Mother   . Kidney disease  Mother     History   Social History  . Marital Status: Single    Spouse Name: N/A    Number of Children: 1  . Years of Education: N/A   Occupational History  . HR specialist     [employer]   Social History Main Topics  . Smoking status: Current Everyday Smoker -- 1.0 packs/day    Types: Cigarettes  . Smokeless tobacco: Never Used   Comment: wants chantix  . Alcohol Use: No  . Drug Use: No  . Sexually Active: Yes -- Female partner(s)    Birth Control/ Protection: Injection     depo shot   Other Topics Concern  . Not on file   Social History Narrative   Has a son. Living with her mother, stepfather, and brother and sister.  2 caffeinated drinks daily.On Depo for birth control.     Current Outpatient Prescriptions on File Prior to Visit  Medication Sig Dispense Refill  . cetirizine (ZYRTEC) 10 MG tablet Take 1 tablet (10 mg total) by mouth 2 (two) times daily as needed for allergies or rhinitis.      Marland Kitchen DISCONTD: medroxyPROGESTERone (DEPO-PROVERA) 150 MG/ML injection Inject 150 mg into the muscle every 3 (three) months.         Current Facility-Administered Medications on  File Prior to Visit  Medication Dose Route Frequency Provider Last Rate Last Dose  . DISCONTD: medroxyPROGESTERone (DEPO-PROVERA) injection 150 mg  150 mg Intramuscular Q90 days Agapito Games, MD   150 mg at 09/18/10 1640    No Known Allergies  Review of Systems  Review of Systems  Constitutional: Negative for fever, chills and malaise/fatigue.  HENT: Negative for hearing loss, nosebleeds, congestion and neck pain.   Eyes: Negative for discharge.  Respiratory: Negative for cough, sputum production, shortness of breath and wheezing.   Cardiovascular: Negative for chest pain, palpitations and leg swelling.  Gastrointestinal: Negative for heartburn, nausea, vomiting, abdominal pain, diarrhea, constipation and blood in stool.  Genitourinary: Positive for dysuria and urgency. Negative for frequency and  hematuria.  Musculoskeletal: Positive for back pain. Negative for myalgias, joint pain and falls.  Skin: Negative for rash.  Neurological: Negative for dizziness, tremors, sensory change, focal weakness, loss of consciousness, weakness and headaches.  Endo/Heme/Allergies: Negative for polydipsia. Does not bruise/bleed easily.  Psychiatric/Behavioral: Negative for depression and suicidal ideas. The patient is not nervous/anxious and does not have insomnia.     Objective  BP 110/74  Pulse 113  Temp(Src) 98.1 F (36.7 C) (Temporal)  Ht 5' 4.5" (1.638 m)  Wt 158 lb (71.668 kg)  BMI 26.70 kg/m2  SpO2 97%  Physical Exam  Physical Exam  Constitutional: She is oriented to person, place, and time and well-developed, well-nourished, and in no distress. No distress.  HENT:  Head: Normocephalic and atraumatic.  Right Ear: External ear normal.  Left Ear: External ear normal.  Nose: Nose normal.  Mouth/Throat: Oropharynx is clear and moist. No oropharyngeal exudate.  Eyes: Conjunctivae are normal. Pupils are equal, round, and reactive to light. Right eye exhibits no discharge. Left eye exhibits no discharge. No scleral icterus.  Neck: Normal range of motion. Neck supple. No thyromegaly present.  Cardiovascular: Normal rate, regular rhythm, normal heart sounds and intact distal pulses.   No murmur heard. Pulmonary/Chest: Effort normal and breath sounds normal. No respiratory distress. She has no wheezes. She has no rales.  Abdominal: Soft. Bowel sounds are normal. She exhibits no distension and no mass. There is no tenderness.  Musculoskeletal: Normal range of motion. She exhibits no edema and no tenderness.       Right shoulder: She exhibits tenderness.       Mild pain with palp  Lymphadenopathy:    She has no cervical adenopathy.  Neurological: She is alert and oriented to person, place, and time. She has normal reflexes. No cranial nerve deficit. Coordination normal.  Skin: Skin is warm  and dry. No rash noted. She is not diaphoretic.  Psychiatric: Mood, memory and affect normal.       Assessment & Plan  Anxiety as acute reaction to exceptional stress Reports this has improved over the last month now that she is no longer at the job that was causing her to feel stressed. Was using Hydroxyzine for sleep and not longer needs it.  Bipolar 1 disorder Had her Lamictal increased to 200mg  during a stressful period but would like to have it dropped back to 150 mg   Abnormal cells of cervix Reports last pap normal but a number of years ago she had an abnormal pap for which she was sent for colpo but no procedure was ever performed, paps normalized but unfortunately she has not had a pap in nearly 2 years now, agrees to return for pap in 1 month, will request old  records. Given info on Gardisil encouraged to start series at next visit  Allergic state Flared recently encouraged Zyrtec once to twice daily  Back pain, lumbosacral Bad for past 2 days, has had trouble intermittently for past 5 years. Denies any recent trauma, is given Naproxen 375 mg po bid prn and Flexeril prn. Stay active and apply moist heat report if no relief for consideration of steroids  Diabetes mellitus No trouble since pregnancy, will obtain a fasting set of labs to further evaluate prior to next appt  Tobacco abuse Encouraged complete cessation

## 2011-08-01 ENCOUNTER — Other Ambulatory Visit: Payer: Self-pay

## 2011-08-01 DIAGNOSIS — F319 Bipolar disorder, unspecified: Secondary | ICD-10-CM

## 2011-08-01 MED ORDER — LAMOTRIGINE 150 MG PO TABS: 150.0000 mg | ORAL_TABLET | Freq: Every day | ORAL | Status: DC

## 2011-08-01 NOTE — Telephone Encounter (Signed)
Patient requesting 90 day supply.

## 2011-08-02 ENCOUNTER — Other Ambulatory Visit: Payer: Self-pay

## 2011-08-02 DIAGNOSIS — M549 Dorsalgia, unspecified: Secondary | ICD-10-CM

## 2011-08-02 MED ORDER — DESOGESTREL-ETHINYL ESTRADIOL 0.15-0.02/0.01 MG (21/5) PO TABS: 1.0000 | ORAL_TABLET | Freq: Every day | ORAL | Status: DC

## 2011-08-02 MED ORDER — NAPROXEN 375 MG PO TABS: 375.0000 mg | ORAL_TABLET | Freq: Two times a day (BID) | ORAL | Status: DC

## 2011-08-03 ENCOUNTER — Ambulatory Visit: Payer: Federal, State, Local not specified - PPO | Admitting: Family Medicine

## 2011-10-10 ENCOUNTER — Encounter (HOSPITAL_COMMUNITY): Payer: Self-pay | Admitting: Psychiatry

## 2012-01-03 ENCOUNTER — Ambulatory Visit: Payer: Federal, State, Local not specified - PPO | Admitting: Family Medicine

## 2012-01-04 ENCOUNTER — Ambulatory Visit (INDEPENDENT_AMBULATORY_CARE_PROVIDER_SITE_OTHER): Payer: Federal, State, Local not specified - PPO | Admitting: Family Medicine

## 2012-01-04 ENCOUNTER — Encounter: Payer: Self-pay | Admitting: Family Medicine

## 2012-01-04 ENCOUNTER — Telehealth: Payer: Self-pay | Admitting: Family Medicine

## 2012-01-04 VITALS — BP 99/68 | HR 85 | Temp 98.1°F | Ht 64.5 in | Wt 164.8 lb

## 2012-01-04 DIAGNOSIS — F17200 Nicotine dependence, unspecified, uncomplicated: Secondary | ICD-10-CM

## 2012-01-04 DIAGNOSIS — L709 Acne, unspecified: Secondary | ICD-10-CM

## 2012-01-04 DIAGNOSIS — R300 Dysuria: Secondary | ICD-10-CM

## 2012-01-04 DIAGNOSIS — F419 Anxiety disorder, unspecified: Secondary | ICD-10-CM

## 2012-01-04 DIAGNOSIS — L259 Unspecified contact dermatitis, unspecified cause: Secondary | ICD-10-CM

## 2012-01-04 DIAGNOSIS — F411 Generalized anxiety disorder: Secondary | ICD-10-CM

## 2012-01-04 DIAGNOSIS — Z309 Encounter for contraceptive management, unspecified: Secondary | ICD-10-CM

## 2012-01-04 DIAGNOSIS — L708 Other acne: Secondary | ICD-10-CM

## 2012-01-04 DIAGNOSIS — Z23 Encounter for immunization: Secondary | ICD-10-CM

## 2012-01-04 DIAGNOSIS — L309 Dermatitis, unspecified: Secondary | ICD-10-CM

## 2012-01-04 DIAGNOSIS — T7840XA Allergy, unspecified, initial encounter: Secondary | ICD-10-CM

## 2012-01-04 DIAGNOSIS — Z720 Tobacco use: Secondary | ICD-10-CM

## 2012-01-04 MED ORDER — ALPRAZOLAM 2 MG PO TABS: 2.0000 mg | ORAL_TABLET | Freq: Every evening | ORAL | Status: DC | PRN

## 2012-01-04 MED ORDER — ONDANSETRON HCL 4 MG PO TABS: 4.0000 mg | ORAL_TABLET | Freq: Three times a day (TID) | ORAL | Status: DC | PRN

## 2012-01-04 MED ORDER — HPV QUADRIVALENT VACCINE IM SUSP: 0.5000 mL | Freq: Once | INTRAMUSCULAR | Status: DC

## 2012-01-04 MED ORDER — MEDROXYPROGESTERONE ACETATE 150 MG/ML IM SUSP
150.0000 mg | Freq: Once | INTRAMUSCULAR | Status: AC
  Administered 2012-01-04: 150 mg via INTRAMUSCULAR

## 2012-01-04 MED ORDER — CLOBETASOL PROPIONATE 0.05 % EX CREA: TOPICAL_CREAM | Freq: Every day | CUTANEOUS | Status: DC | PRN

## 2012-01-04 MED ORDER — CETIRIZINE HCL 10 MG PO TABS: 10.0000 mg | ORAL_TABLET | Freq: Every day | ORAL | Status: DC | PRN

## 2012-01-04 MED ORDER — CLINDAMYCIN PHOS-BENZOYL PEROX 1-5 % EX GEL: Freq: Two times a day (BID) | CUTANEOUS | Status: DC

## 2012-01-04 MED ORDER — MINOCYCLINE HCL 100 MG PO CAPS: 100.0000 mg | ORAL_CAPSULE | Freq: Two times a day (BID) | ORAL | Status: DC

## 2012-01-04 MED ORDER — PROBIOTIC PRODUCT PO CHEW: CHEWABLE_TABLET | ORAL | Status: DC

## 2012-01-04 NOTE — Telephone Encounter (Signed)
Patient has a "terrible headache" and is nauseous. She wants to know if this is a normal reaction to the injections she got today.

## 2012-01-04 NOTE — Telephone Encounter (Signed)
RX sent

## 2012-01-04 NOTE — Telephone Encounter (Signed)
Unlikely due to the shots, but if she gets worse she can certainly go get looked at. Try taking some Tylenol for the headache and we can call in some Ondansetron 4 mg tab po prn nausea, q 8 hr , disp #20

## 2012-01-04 NOTE — Telephone Encounter (Signed)
Left a detailed message and I will call in the Ondansetron incase pt wants to pick this up

## 2012-01-04 NOTE — Patient Instructions (Addendum)
Acne  Acne is a skin problem that causes pimples. Acne occurs when the pores in your skin get blocked. Your pores may become red, sore, and swollen (inflamed), or infected with a common skin bacterium (Propionibacterium acnes). Acne is a common skin problem. Up to 80% of people get acne at some time. Acne is especially common from the ages of 12 to 24. Acne usually goes away over time with proper treatment.  CAUSES   Your pores each contain an oil gland. The oil glands make an oily substance called sebum. Acne happens when these glands get plugged with sebum, dead skin cells, and dirt. The P. acnes bacteria that are normally found in the oil glands then multiply, causing inflammation. Acne is commonly triggered by changes in your hormones. These hormonal changes can cause the oil glands to get bigger and to make more sebum. Factors that can make acne worse include:  · Hormone changes during adolescence.  · Hormone changes during women's menstrual cycles.  · Hormone changes during pregnancy.  · Oil-based cosmetics and hair products.  · Harshly scrubbing the skin.  · Strong soaps.  · Stress.  · Hormone problems due to certain diseases.  · Long or oily hair rubbing against the skin.  · Certain medicines.  · Pressure from headbands, backpacks, or shoulder pads.  · Exposure to certain oils and chemicals.  SYMPTOMS   Acne often occurs on the face, neck, chest, and upper back. Symptoms include:  · Small, red bumps (pimples or papules).  · Whiteheads (closed comedones).  · Blackheads (open comedones).  · Small, pus-filled pimples (pustules).  · Big, red pimples or pustules that feel tender.  More severe acne can cause:  · An infected area that contains a collection of pus (abscess).  · Hard, painful, fluid-filled sacs (cysts).  · Scars.  DIAGNOSIS   Your caregiver can usually tell what the problem is by doing a physical exam.  TREATMENT   There are many good treatments for acne. Some are available over-the-counter and some  are available with a prescription. The treatment that is best for you depends on the type of acne you have and how severe it is. It may take 2 months of treatment before your acne gets better. Common treatments include:  · Creams and lotions that prevent oil glands from clogging.  · Creams and lotions that treat or prevent infections and inflammation.  · Antibiotics applied to the skin or taken as a pill.  · Pills that decrease sebum production.  · Birth control pills.  · Light or laser treatments.  · Minor surgery.  · Injections of medicine into the affected areas.  · Chemicals that cause peeling of the skin.  HOME CARE INSTRUCTIONS   Good skin care is the most important part of treatment.  · Wash your skin gently at least twice a day and after exercise. Always wash your skin before bed.  · Use mild soap.  · After each wash, apply a water-based skin moisturizer.  · Keep your hair clean and off of your face. Shampoo your hair daily.  · Only take medicines as directed by your caregiver.  · Use a sunscreen or sunblock with SPF 30 or greater. This is especially important when you are using acne medicines.  · Choose cosmetics that are noncomedogenic. This means they do not plug the oil glands.  · Avoid leaning your chin or forehead on your hands.  · Avoid wearing tight headbands or hats.  ·   Avoid picking or squeezing your pimples. This can make your acne worse and cause scarring.  SEEK MEDICAL CARE IF:   · Your acne is not better after 8 weeks.  · Your acne gets worse.  · You have a large area of skin that is red or tender.  Document Released: 03/02/2000 Document Revised: 05/28/2011 Document Reviewed: 12/22/2010  Exit[employer]® Patient Information ©2013 [employer].

## 2012-01-06 ENCOUNTER — Encounter: Payer: Self-pay | Admitting: Family Medicine

## 2012-01-06 DIAGNOSIS — L709 Acne, unspecified: Secondary | ICD-10-CM

## 2012-01-06 DIAGNOSIS — Z309 Encounter for contraceptive management, unspecified: Secondary | ICD-10-CM | POA: Insufficient documentation

## 2012-01-06 DIAGNOSIS — L309 Dermatitis, unspecified: Secondary | ICD-10-CM

## 2012-01-06 HISTORY — DX: Dermatitis, unspecified: L30.9

## 2012-01-06 HISTORY — DX: Acne, unspecified: L70.9

## 2012-01-06 NOTE — Assessment & Plan Note (Signed)
Pregnancy test negative today. Patient agrees to retry DepoProvera today. Has been having trouble with irregular menses.

## 2012-01-06 NOTE — Progress Notes (Signed)
Patient ID: Jillian Reyes, female   DOB: November 18, 1990, 21 y.o.   MRN: [id number] Jillian Reyes [id number] [birth year] 01/06/2012      Progress Note-Follow Up  Subjective  Chief Complaint  Chief Complaint  Patient presents with  . Eczema  . Vaginal Bleeding    spotting between menses    HPI  Patient is a 21 year old Caucasian female who is in today for multiple concerns. One is her eczema has flared up in several spots. She has used clobetasol in the past with good results. She also was last 2 months maculopapular. She is frustrated regarding irregular menses and heavy painful periods. No significant discharge, back pain, belly pain, fevers or chills noted. No other recent illness or GI complaints noted. No chest pain, palpitations, shortness of breath.  Past Medical History  Diagnosis Date  . Bipolar 1 disorder   . Chicken pox as a child  . Diabetes mellitus     gestational   . Back pain, lumbosacral   . Allergic state   . Abnormal cells of cervix   . Contraceptive management 01/06/2012  . Eczema 01/06/2012  . Acne 01/06/2012    History reviewed. No pertinent past surgical history.  Family History  Problem Relation Age of Onset  . Heart disease    . Diabetes Father 73    type 2  . Thyroid disease Father   . Hyperlipidemia      great grandparents  . Migraines Mother   . Kidney disease Mother     History   Social History  . Marital Status: Single    Spouse Name: N/A    Number of Children: 1  . Years of Education: N/A   Occupational History  . HR specialist     [employer]   Social History Main Topics  . Smoking status: Current Every Day Smoker -- 1.0 packs/day    Types: Cigarettes  . Smokeless tobacco: Never Used   Comment: wants chantix  . Alcohol Use: No  . Drug Use: No  . Sexually Active: Yes -- Female partner(s)    Birth Control/ Protection: Injection     depo shot   Other Topics Concern  . Not on file   Social History Narrative   Has a son. Living with  her mother, stepfather, and brother and sister.  2 caffeinated drinks daily.On Depo for birth control.     Current Outpatient Prescriptions on File Prior to Visit  Medication Sig Dispense Refill  . lamoTRIgine (LAMICTAL) 150 MG tablet Take 1 tablet (150 mg total) by mouth daily.  180 tablet  1  . cetirizine (ZYRTEC) 10 MG tablet Take 1 tablet (10 mg total) by mouth daily as needed for allergies or rhinitis.  30 tablet  2  . DISCONTD: medroxyPROGESTERone (DEPO-PROVERA) 150 MG/ML injection Inject 150 mg into the muscle every 3 (three) months.         No current facility-administered medications on file prior to visit.    No Known Allergies  Review of Systems  Review of Systems  Constitutional: Negative for fever and malaise/fatigue.  HENT: Negative for congestion.   Eyes: Negative for discharge.  Respiratory: Negative for shortness of breath.   Cardiovascular: Negative for chest pain, palpitations and leg swelling.  Gastrointestinal: Negative for nausea, abdominal pain and diarrhea.  Genitourinary: Negative for dysuria.  Musculoskeletal: Negative for falls.  Skin: Negative for rash.       Facial acne  Neurological: Negative for loss of consciousness and headaches.  Endo/Heme/Allergies:  Negative for polydipsia.  Psychiatric/Behavioral: Negative for depression and suicidal ideas. The patient is not nervous/anxious and does not have insomnia.     Objective  BP 99/68  Pulse 85  Temp 98.1 F (36.7 C) (Temporal)  Ht 5' 4.5" (1.638 m)  Wt 164 lb 12.8 oz (74.753 kg)  BMI 27.85 kg/m2  SpO2 96%  LMP 01/04/2012  Physical Exam  Physical Exam  Constitutional: She is oriented to person, place, and time and well-developed, well-nourished, and in no distress. No distress.  HENT:  Head: Normocephalic and atraumatic.  Eyes: Conjunctivae normal are normal.  Neck: Neck supple. No thyromegaly present.  Cardiovascular: Normal rate, regular rhythm and normal heart sounds.   No murmur  heard. Pulmonary/Chest: Effort normal and breath sounds normal. No respiratory distress. She has no wheezes.  Abdominal: She exhibits no distension and no mass.  Musculoskeletal: She exhibits no edema.  Lymphadenopathy:    She has no cervical adenopathy.  Neurological: She is alert and oriented to person, place, and time.  Skin: Skin is warm and dry. No rash noted. She is not diaphoretic.  Psychiatric: Memory, affect and judgment normal.     Assessment & Plan  Contraceptive management Pregnancy test negative today. Patient agrees to retry DepoProvera today. Has been having trouble with irregular menses.  Tobacco abuse Unfortunately continues to smoke, is contemplating trying to quit, is offered encouragement, consider a nicotine replacement product.  Allergic state Given Flu shot today, encouraged to take Zyrtec daily  Eczema Clobetasol cream to use sparingly prn  Acne Benzaclin bid, Minocycline bid   Gardisil #1 given today

## 2012-01-06 NOTE — Assessment & Plan Note (Signed)
Unfortunately continues to smoke, is contemplating trying to quit, is offered encouragement, consider a nicotine replacement product.

## 2012-01-06 NOTE — Assessment & Plan Note (Addendum)
Benzaclin bid, Minocycline bid

## 2012-01-06 NOTE — Assessment & Plan Note (Signed)
Clobetasol cream to use sparingly prn

## 2012-01-06 NOTE — Assessment & Plan Note (Signed)
Given Flu shot today, encouraged to take Zyrtec daily

## 2012-01-08 ENCOUNTER — Ambulatory Visit (INDEPENDENT_AMBULATORY_CARE_PROVIDER_SITE_OTHER): Payer: Federal, State, Local not specified - PPO | Admitting: Family Medicine

## 2012-01-08 ENCOUNTER — Encounter: Payer: Self-pay | Admitting: Family Medicine

## 2012-01-08 VITALS — BP 116/75 | HR 93 | Temp 97.5°F | Ht 64.5 in | Wt 166.0 lb

## 2012-01-08 DIAGNOSIS — M549 Dorsalgia, unspecified: Secondary | ICD-10-CM

## 2012-01-08 LAB — POCT URINALYSIS DIPSTICK
Blood, UA: NEGATIVE
Glucose, UA: NEGATIVE
Protein, UA: NEGATIVE
Spec Grav, UA: >=1.03
pH, UA: 5.5

## 2012-01-08 MED ORDER — CYCLOBENZAPRINE HCL 10 MG PO TABS: 10.0000 mg | ORAL_TABLET | Freq: Three times a day (TID) | ORAL | Status: DC | PRN

## 2012-01-08 MED ORDER — NAPROXEN SODIUM 220 MG PO CAPS: 440.0000 mg | ORAL_CAPSULE | Freq: Two times a day (BID) | ORAL | Status: DC | PRN

## 2012-01-08 NOTE — Assessment & Plan Note (Signed)
Intermittent for roughly 5 years. Worse in past 24 hours. Patient feels the pain first started after an epidural for her C section. Given samples of Naproxen 220 2 tabs po bid prn with food and Cyclobenzaprine 10 mg prn. Referred to ortho for further evaluation

## 2012-01-08 NOTE — Patient Instructions (Addendum)
Back Pain, Adult Low back pain is very common. About 1 in 5 people have back pain.The cause of low back pain is rarely dangerous. The pain often gets better over time.About half of people with a sudden onset of back pain feel better in just 2 weeks. About 8 in 10 people feel better by 6 weeks.  CAUSES Some common causes of back pain include:  Strain of the muscles or ligaments supporting the spine.  Wear and tear (degeneration) of the spinal discs.  Arthritis.  Direct injury to the back. DIAGNOSIS Most of the time, the direct cause of low back pain is not known.However, back pain can be treated effectively even when the exact cause of the pain is unknown.Answering your caregiver's questions about your overall health and symptoms is one of the most accurate ways to make sure the cause of your pain is not dangerous. If your caregiver needs more information, he or she may order lab work or imaging tests (X-rays or MRIs).However, even if imaging tests show changes in your back, this usually does not require surgery. HOME CARE INSTRUCTIONS For many people, back pain returns.Since low back pain is rarely dangerous, it is often a condition that people can learn to manageon their own.   Remain active. It is stressful on the back to sit or stand in one place. Do not sit, drive, or stand in one place for more than 30 minutes at a time. Take short walks on level surfaces as soon as pain allows.Try to increase the length of time you walk each day.  Do not stay in bed.Resting more than 1 or 2 days can delay your recovery.  Do not avoid exercise or work.Your body is made to move.It is not dangerous to be active, even though your back may hurt.Your back will likely heal faster if you return to being active before your pain is gone.  Pay attention to your body when you bend and lift. Many people have less discomfortwhen lifting if they bend their knees, keep the load close to their bodies,and  avoid twisting. Often, the most comfortable positions are those that put less stress on your recovering back.  Find a comfortable position to sleep. Use a firm mattress and lie on your side with your knees slightly bent. If you lie on your back, put a pillow under your knees.  Only take over-the-counter or prescription medicines as directed by your caregiver. Over-the-counter medicines to reduce pain and inflammation are often the most helpful.Your caregiver may prescribe muscle relaxant drugs.These medicines help dull your pain so you can more quickly return to your normal activities and healthy exercise.  Put ice on the injured area.  Put ice in a plastic bag.  Place a towel between your skin and the bag.  Leave the ice on for 15 to 20 minutes, 3 to 4 times a day for the first 2 to 3 days. After that, ice and heat may be alternated to reduce pain and spasms.  Ask your caregiver about trying back exercises and gentle massage. This may be of some benefit.  Avoid feeling anxious or stressed.Stress increases muscle tension and can worsen back pain.It is important to recognize when you are anxious or stressed and learn ways to manage it.Exercise is a great option. SEEK MEDICAL CARE IF:  You have pain that is not relieved with rest or medicine.  You have pain that does not improve in 1 week.  You have new symptoms.  You are generally   not feeling well. SEEK IMMEDIATE MEDICAL CARE IF:   You have pain that radiates from your back into your legs.  You develop new bowel or bladder control problems.  You have unusual weakness or numbness in your arms or legs.  You develop nausea or vomiting.  You develop abdominal pain.  You feel faint. Document Released: 03/05/2005 Document Revised: 09/04/2011 Document Reviewed: 07/24/2010 Exit[employer] Patient Information 2013 [employer].  

## 2012-01-08 NOTE — Progress Notes (Signed)
Patient ID: Jillian Reyes, female   DOB: [birth year], 21 y.o.   MRN: [id number] Jillian Reyes [id number] [birth year] 01/08/2012      Progress Note-Follow Up  Subjective  Chief Complaint  Chief Complaint  Patient presents with  . Back Pain    began last night    HPI  Patient is a 21 year old Caucasian female who is in today for back pain. He says this episode started last night. No acute injury. No radicular symptoms. She's had trouble intermittently with back pain roughly 5 years relates it back to her C-section years ago. It is sharp and she has trouble finding a comfortable position. No chest pain, shortness of breath, palpitations, GI or GU complaints.  Past Medical History  Diagnosis Date  . Bipolar 1 disorder   . Chicken pox as a child  . Diabetes mellitus     gestational   . Back pain, lumbosacral   . Allergic state   . Abnormal cells of cervix   . Contraceptive management 01/06/2012  . Eczema 01/06/2012  . Acne 01/06/2012  . Mid back pain     History reviewed. No pertinent past surgical history.  Family History  Problem Relation Age of Onset  . Heart disease    . Diabetes Father 54    type 2  . Thyroid disease Father   . Hyperlipidemia      great grandparents  . Migraines Mother   . Kidney disease Mother     History   Social History  . Marital Status: Single    Spouse Name: N/A    Number of Children: 1  . Years of Education: N/A   Occupational History  . HR specialist     [employer]   Social History Main Topics  . Smoking status: Current Every Day Smoker -- 1.0 packs/day    Types: Cigarettes  . Smokeless tobacco: Never Used   Comment: wants chantix  . Alcohol Use: No  . Drug Use: No  . Sexually Active: Yes -- Female partner(s)    Birth Control/ Protection: Injection     depo shot   Other Topics Concern  . Not on file   Social History Narrative   Has a son. Living with her mother, stepfather, and brother and sister.  2 caffeinated drinks daily.On  Depo for birth control.     Current Outpatient Prescriptions on File Prior to Visit  Medication Sig Dispense Refill  . alprazolam (XANAX) 2 MG tablet Take 1 tablet (2 mg total) by mouth at bedtime as needed for sleep or anxiety.  30 tablet  3  . cetirizine (ZYRTEC) 10 MG tablet Take 1 tablet (10 mg total) by mouth daily as needed for allergies or rhinitis.  30 tablet  2  . clindamycin-benzoyl peroxide (BENZACLIN) gel Apply topically 2 (two) times daily.  25 g  0  . clobetasol cream (TEMOVATE) 0.05 % Apply topically daily as needed. eczema  30 g  1  . lamoTRIgine (LAMICTAL) 150 MG tablet Take 1 tablet (150 mg total) by mouth daily.  180 tablet  1  . minocycline (MINOCIN,DYNACIN) 100 MG capsule Take 1 capsule (100 mg total) by mouth 2 (two) times daily.  40 capsule  0  . ondansetron (ZOFRAN) 4 MG tablet Take 1 tablet (4 mg total) by mouth every 8 (eight) hours as needed.  20 tablet  0  . Probiotic Product (MISC INTESTINAL FLORA REGULAT) CHEW Digestive Health probiotic gummies by Schiff    0  . DISCONTD:  naproxen (NAPROSYN) 375 MG tablet Take 1 tablet (375 mg total) by mouth 2 (two) times daily with a meal.  360 tablet  0  . DISCONTD: medroxyPROGESTERone (DEPO-PROVERA) 150 MG/ML injection Inject 150 mg into the muscle every 3 (three) months.          No Known Allergies  Review of Systems  Review of Systems  Constitutional: Negative for fever and malaise/fatigue.  HENT: Negative for congestion.   Eyes: Negative for discharge.  Respiratory: Negative for shortness of breath.   Cardiovascular: Negative for chest pain, palpitations and leg swelling.  Gastrointestinal: Negative for nausea, abdominal pain and diarrhea.  Genitourinary: Negative for dysuria.  Musculoskeletal: Positive for back pain. Negative for falls.  Skin: Negative for rash.  Neurological: Negative for loss of consciousness and headaches.  Endo/Heme/Allergies: Negative for polydipsia.  Psychiatric/Behavioral: Negative for  depression and suicidal ideas. The patient is not nervous/anxious and does not have insomnia.     Objective  BP 116/75  Pulse 93  Temp 97.5 F (36.4 C) (Temporal)  Ht 5' 4.5" (1.638 m)  Wt 166 lb (75.297 kg)  BMI 28.05 kg/m2  SpO2 97%  LMP 01/04/2012  Physical Exam  Physical Exam  Constitutional: She is oriented to person, place, and time and well-developed, well-nourished, and in no distress. No distress.  HENT:  Head: Normocephalic and atraumatic.  Eyes: Conjunctivae normal are normal.  Neck: Neck supple. No thyromegaly present.  Cardiovascular: Normal rate, regular rhythm and normal heart sounds.   No murmur heard. Pulmonary/Chest: Effort normal and breath sounds normal. She has no wheezes.  Abdominal: She exhibits no distension and no mass.  Musculoskeletal: She exhibits no edema.  Lymphadenopathy:    She has no cervical adenopathy.  Neurological: She is alert and oriented to person, place, and time.  Skin: Skin is warm and dry. No rash noted. She is not diaphoretic.  Psychiatric: Memory, affect and judgment normal.      Assessment & Plan  Mid back pain Intermittent for roughly 5 years. Worse in past 24 hours. Patient feels the pain first started after an epidural for her C section. Given samples of Naproxen 220 2 tabs po bid prn with food and Cyclobenzaprine 10 mg prn. Referred to ortho for further evaluation

## 2012-01-31 ENCOUNTER — Encounter: Payer: Self-pay | Admitting: Family Medicine

## 2012-01-31 ENCOUNTER — Ambulatory Visit: Payer: Federal, State, Local not specified - PPO

## 2012-02-04 ENCOUNTER — Ambulatory Visit: Payer: Federal, State, Local not specified - PPO

## 2012-02-04 DIAGNOSIS — Z23 Encounter for immunization: Secondary | ICD-10-CM

## 2012-02-04 NOTE — Progress Notes (Signed)
  Subjective:    Patient ID: Jillian Reyes, female    DOB: [birth year], 21 y.o.   MRN: [id number]  HPI    Review of Systems     Objective:   Physical Exam        Assessment & Plan:  Patient came in today for 2nd HPV injection. Pt tolerated injection well

## 2012-02-13 ENCOUNTER — Telehealth: Payer: Self-pay | Admitting: Family Medicine

## 2012-02-13 NOTE — Telephone Encounter (Signed)
In reference to Orthopaedic referral entered on 01/08/12, after multiple attempts and message left for patient by [facility], patient has not responded.  Also, I have left messages for patient, and mailed her a letter, but as of today, no response and no appointment scheduled.

## 2012-03-22 ENCOUNTER — Other Ambulatory Visit: Payer: Self-pay | Admitting: Family Medicine

## 2012-04-07 ENCOUNTER — Ambulatory Visit: Payer: Federal, State, Local not specified - PPO | Admitting: Family Medicine

## 2012-04-12 ENCOUNTER — Other Ambulatory Visit: Payer: Self-pay | Admitting: Family Medicine

## 2012-05-08 ENCOUNTER — Other Ambulatory Visit: Payer: Self-pay | Admitting: Family Medicine

## 2012-05-08 ENCOUNTER — Telehealth: Payer: Self-pay | Admitting: *Deleted

## 2012-05-08 NOTE — Telephone Encounter (Signed)
Patient called in regarding this refill, stating that she is out of med and will be leaving this evening for two weeks and is hoping that this refill will be sent sometime this afternoon[facility] [place]

## 2012-05-08 NOTE — Telephone Encounter (Signed)
Rx faxed to [facility] [phone].

## 2012-05-08 NOTE — Telephone Encounter (Signed)
Please fax to [facility] and let patient know

## 2012-05-15 ENCOUNTER — Encounter: Payer: Self-pay | Admitting: Family Medicine

## 2012-05-15 ENCOUNTER — Ambulatory Visit (INDEPENDENT_AMBULATORY_CARE_PROVIDER_SITE_OTHER): Payer: Federal, State, Local not specified - PPO | Admitting: Family Medicine

## 2012-05-15 VITALS — BP 110/76 | HR 76 | Temp 98.4°F | Ht 64.5 in | Wt 172.0 lb

## 2012-05-15 DIAGNOSIS — Z3049 Encounter for surveillance of other contraceptives: Secondary | ICD-10-CM

## 2012-05-15 DIAGNOSIS — R4589 Other symptoms and signs involving emotional state: Secondary | ICD-10-CM

## 2012-05-15 DIAGNOSIS — Z309 Encounter for contraceptive management, unspecified: Secondary | ICD-10-CM

## 2012-05-15 DIAGNOSIS — F411 Generalized anxiety disorder: Secondary | ICD-10-CM

## 2012-05-15 LAB — POCT URINE PREGNANCY: Preg Test, Ur: NEGATIVE

## 2012-05-15 MED ORDER — FLUOXETINE HCL 20 MG PO TABS: 20.0000 mg | ORAL_TABLET | Freq: Every day | ORAL | Status: DC

## 2012-05-15 MED ORDER — ALPRAZOLAM 2 MG PO TABS: ORAL_TABLET | ORAL | Status: DC

## 2012-05-15 NOTE — Progress Notes (Signed)
OFFICE NOTE  05/15/2012  CC:  Chief Complaint  Patient presents with  . discuss meds    increase xanax quantity, discuss birth control     HPI: Patient is a 22 y.o. Caucasian female who is here for discussion of possible medication change. She initially complains of some wt fluctuations over the last couple of months, wonders if it is from the depo-provera. She has been on this med for years.  No menses in years.  She does smoke cigarettes.   She lives with her husband, is monogamous.  She then begins to explain that maybe the wt fluctuation has more to do with stress. Patient reports significantly worsened acute stress over the last month to 6 wks.  Says it stems from poor relationships with some of her husband's family members, particularly his mom.  She says the tension is such that she wakes up in the morning with her stomach in knots, takes and hour to get up the courage to get out of bed and begin her day.   Sometimes has panic attacks.  Has increased her cigarette smoking to 2.5 packs per day.  Mood is slightly depressed.  Appetite up and down, energy level down.  Sleep is poor.  No HI or SI. She feels like lamictal has helped her bipolar disorder but thinks maybe she is a bit tolerant to this med and wonders if the dose needs to be increased.  ROS: no tremor, no heat intolerance, no n/v/d, no rash, no HA's or dizziness  Pertinent PMH:  Past Medical History  Diagnosis Date  . Bipolar 1 disorder   . Chicken pox as a child  . Diabetes mellitus     gestational   . Back pain, lumbosacral   . Allergic state   . Abnormal cells of cervix   . Contraceptive management 01/06/2012  . Eczema 01/06/2012  . Acne 01/06/2012  . Mid back pain     MEDS:  Outpatient Prescriptions Prior to Visit  Medication Sig Dispense Refill  . cetirizine (ZYRTEC) 10 MG tablet Take 1 tablet (10 mg total) by mouth daily as needed for allergies or rhinitis.  30 tablet  2  . clindamycin-benzoyl peroxide  (BENZACLIN) gel APPLY TOPICALLY 2 (TWO) TIMES DAILY.  50 g  0  . clobetasol cream (TEMOVATE) 0.05 % Apply topically daily as needed. eczema  30 g  1  . cyclobenzaprine (FLEXERIL) 10 MG tablet Take 1 tablet (10 mg total) by mouth 3 (three) times daily as needed for muscle spasms.  30 tablet  0  . lamoTRIgine (LAMICTAL) 150 MG tablet TAKE 1 TABLET (150 MG TOTAL) BY MOUTH DAILY.  180 tablet  1  . minocycline (MINOCIN,DYNACIN) 100 MG capsule Take 1 capsule (100 mg total) by mouth 2 (two) times daily.  40 capsule  0  . Naproxen Sodium 220 MG CAPS Take 2 capsules (440 mg total) by mouth 2 (two) times daily as needed. X 3 days then as needed with food  30 each  0  . Probiotic Product (MISC INTESTINAL FLORA REGULAT) CHEW Digestive Health probiotic gummies by Schiff    0  . alprazolam (XANAX) 2 MG tablet TAKE 1 TABLET BY MOUTH AT BEDTIME  30 tablet  3  . ondansetron (ZOFRAN) 4 MG tablet Take 1 tablet (4 mg total) by mouth every 8 (eight) hours as needed.  20 tablet  0   No facility-administered medications prior to visit.    PE: Blood pressure 110/76, pulse 76, temperature 98.4 F (  36.9 C), temperature source Temporal, height 5' 4.5" (1.638 m), weight 172 lb (78.019 kg). Gen: Alert, well appearing.  Patient is oriented to person, place, time, and situation. AFFECT: pleasant, lucid thought and speech. No further exam today.  LAB: UPT neg today  IMPRESSION AND PLAN:  Anxiety as acute reaction to exceptional stress Increase xanax 2mg  to bid dosing instead of qd. Start fluoxetine 20mg  qd.  Therapeutic expectations and side effect profile of medication discussed today.  Patient's questions answered.   Contraceptive management Advised against combined OCPs or progestin only pill for her. I recommended she stick with depo-provera.  She is overdue for her injection, so she has to start over.  UPT today was neg.  She'll return in 1 wk for UPT and if neg then she'll get her depo-provera at that  time.    An After Visit Summary was printed and given to the patient.   FOLLOW UP: 24mo

## 2012-05-15 NOTE — Assessment & Plan Note (Signed)
Increase xanax 2mg  to bid dosing instead of qd. Start fluoxetine 20mg  qd.  Therapeutic expectations and side effect profile of medication discussed today.  Patient's questions answered.

## 2012-05-15 NOTE — Assessment & Plan Note (Signed)
Advised against combined OCPs or progestin only pill for her. I recommended she stick with depo-provera.  She is overdue for her injection, so she has to start over.  UPT today was neg.  She'll return in 1 wk for UPT and if neg then she'll get her depo-provera at that time.

## 2012-07-02 ENCOUNTER — Ambulatory Visit (INDEPENDENT_AMBULATORY_CARE_PROVIDER_SITE_OTHER): Payer: Federal, State, Local not specified - PPO | Admitting: Family Medicine

## 2012-07-02 ENCOUNTER — Encounter: Payer: Self-pay | Admitting: Family Medicine

## 2012-07-02 VITALS — BP 111/77 | HR 85 | Temp 97.0°F | Ht 64.5 in | Wt 176.4 lb

## 2012-07-02 DIAGNOSIS — F411 Generalized anxiety disorder: Secondary | ICD-10-CM

## 2012-07-02 DIAGNOSIS — R4589 Other symptoms and signs involving emotional state: Secondary | ICD-10-CM

## 2012-07-02 DIAGNOSIS — M549 Dorsalgia, unspecified: Secondary | ICD-10-CM

## 2012-07-02 MED ORDER — ALPRAZOLAM 2 MG PO TABS: ORAL_TABLET | ORAL | Status: DC

## 2012-07-02 NOTE — Patient Instructions (Addendum)

## 2012-07-06 ENCOUNTER — Encounter: Payer: Self-pay | Admitting: Family Medicine

## 2012-07-06 NOTE — Assessment & Plan Note (Signed)
Frequent back pain, unable to work at this time due to her pain

## 2012-07-06 NOTE — Progress Notes (Signed)
Patient ID: Janae Sauce, female   DOB: 08-Apr-1990, 22 y.o.   MRN: [id number] SERRIA SLOMA [id number] 04-21-1990 07/06/2012      Progress Note-Follow Up  Subjective  Chief Complaint  Chief Complaint  Patient presents with  . Follow-up    notes for legal purpose    HPI  Patient is a 22 yo [place] female in today for follow fu on her anxiety and stress. She continues to struggle with irritabilty, difficulty concentrating and anxiety. She was fired from her job at the [employer] secondary to the demands of her boyfriend's mother. She continues to struggle with chronic back pain as well. No recent illness, fevers, chills, cp, palp, sob, gi or gu c/o.  Past Medical History  Diagnosis Date  . Bipolar 1 disorder   . Chicken pox as a child  . Diabetes mellitus     gestational   . Back pain, lumbosacral   . Allergic state   . Abnormal cells of cervix   . Contraceptive management 01/06/2012  . Eczema 01/06/2012  . Acne 01/06/2012  . Mid back pain     History reviewed. No pertinent past surgical history.  Family History  Problem Relation Age of Onset  . Heart disease    . Diabetes Father 34    type 2  . Thyroid disease Father   . Hyperlipidemia      great grandparents  . Migraines Mother   . Kidney disease Mother     History   Social History  . Marital Status: Single    Spouse Name: N/A    Number of Children: 1  . Years of Education: N/A   Occupational History  . HR specialist     [employer]   Social History Main Topics  . Smoking status: Current Every Day Smoker -- 1.00 packs/day    Types: Cigarettes  . Smokeless tobacco: Never Used     Comment: wants chantix  . Alcohol Use: No  . Drug Use: No  . Sexually Active: Yes -- Female partner(s)    Birth Control/ Protection: Injection     Comment: depo shot   Other Topics Concern  . Not on file   Social History Narrative   Has a son. Living with her mother, stepfather, and brother and sister.     2 caffeinated drinks  daily.   On Depo for birth control.     Current Outpatient Prescriptions on File Prior to Visit  Medication Sig Dispense Refill  . cetirizine (ZYRTEC) 10 MG tablet Take 1 tablet (10 mg total) by mouth daily as needed for allergies or rhinitis.  30 tablet  2  . clindamycin-benzoyl peroxide (BENZACLIN) gel APPLY TOPICALLY 2 (TWO) TIMES DAILY.  50 g  0  . clobetasol cream (TEMOVATE) 0.05 % Apply topically daily as needed. eczema  30 g  1  . cyclobenzaprine (FLEXERIL) 10 MG tablet Take 1 tablet (10 mg total) by mouth 3 (three) times daily as needed for muscle spasms.  30 tablet  0  . FLUoxetine (PROZAC) 20 MG tablet Take 1 tablet (20 mg total) by mouth daily.  30 tablet  1  . lamoTRIgine (LAMICTAL) 150 MG tablet TAKE 1 TABLET (150 MG TOTAL) BY MOUTH DAILY.  180 tablet  1  . minocycline (MINOCIN,DYNACIN) 100 MG capsule Take 1 capsule (100 mg total) by mouth 2 (two) times daily.  40 capsule  0  . Naproxen Sodium 220 MG CAPS Take 2 capsules (440 mg total) by mouth 2 (  two) times daily as needed. X 3 days then as needed with food  30 each  0  . Probiotic Product (MISC INTESTINAL FLORA REGULAT) CHEW Digestive Health probiotic gummies by Schiff    0  . [DISCONTINUED] medroxyPROGESTERone (DEPO-PROVERA) 150 MG/ML injection Inject 150 mg into the muscle every 3 (three) months.        . [DISCONTINUED] naproxen (NAPROSYN) 375 MG tablet Take 1 tablet (375 mg total) by mouth 2 (two) times daily with a meal.  360 tablet  0   No current facility-administered medications on file prior to visit.    No Known Allergies  Review of Systems  Review of Systems  Constitutional: Negative for fever and malaise/fatigue.  HENT: Negative for congestion.   Eyes: Negative for discharge.  Respiratory: Negative for shortness of breath.   Cardiovascular: Negative for chest pain, palpitations and leg swelling.  Gastrointestinal: Negative for nausea, abdominal pain and diarrhea.  Genitourinary: Negative for dysuria.   Musculoskeletal: Negative for falls.  Skin: Negative for rash.  Neurological: Negative for loss of consciousness and headaches.  Endo/Heme/Allergies: Negative for polydipsia.  Psychiatric/Behavioral: Positive for depression. Negative for suicidal ideas. The patient is nervous/anxious. The patient does not have insomnia.     Objective  BP 111/77  Pulse 85  Temp(Src) 97 F (36.1 C) (Temporal)  Ht 5' 4.5" (1.638 m)  Wt 176 lb 6.4 oz (80.015 kg)  BMI 29.82 kg/m2  SpO2 95%  Physical Exam  Physical Exam  Constitutional: She is oriented to person, place, and time and well-developed, well-nourished, and in no distress. No distress.  HENT:  Head: Normocephalic and atraumatic.  Eyes: Conjunctivae are normal.  Neck: Neck supple. No thyromegaly present.  Cardiovascular: Normal rate, regular rhythm and normal heart sounds.   No murmur heard. Pulmonary/Chest: Effort normal and breath sounds normal. She has no wheezes.  Abdominal: She exhibits no distension and no mass.  Musculoskeletal: She exhibits no edema.  Lymphadenopathy:    She has no cervical adenopathy.  Neurological: She is alert and oriented to person, place, and time.  Skin: Skin is warm and dry. No rash noted. She is not diaphoretic.  Psychiatric: Memory, affect and judgment normal.      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

## 2012-07-06 NOTE — Assessment & Plan Note (Addendum)
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 Fired on 04/03/11

## 2012-07-07 ENCOUNTER — Telehealth: Payer: Self-pay

## 2012-07-07 NOTE — Telephone Encounter (Signed)
Left a message for patient to return my call. We need to know if pt needs a note wrote and office visit notes or just office visit notes?

## 2012-07-08 ENCOUNTER — Encounter: Payer: Self-pay | Admitting: Family Medicine

## 2012-07-08 NOTE — Telephone Encounter (Signed)
Patient informed that the paperwork is ready to be picked up in [place].

## 2012-07-08 NOTE — Telephone Encounter (Signed)
Letter done, I am not sure the best way for her to get her notes but certainly she can have whatever she would like

## 2012-07-08 NOTE — Telephone Encounter (Signed)
Patient returned call stating all she needs is Dr. Abner Greenspan to type a letter explaining the extent of the anxiety and stress she has endured over the last year and a half caused by her losing her job, mother-in-law, etc.  She states she needs office notes and this letter.

## 2012-07-08 NOTE — Telephone Encounter (Signed)
Please advise 

## 2012-07-10 ENCOUNTER — Telehealth: Payer: Self-pay | Admitting: Family Medicine

## 2012-07-10 NOTE — Telephone Encounter (Signed)
Please check with patient about what her concerns

## 2012-07-10 NOTE — Telephone Encounter (Signed)
Patient left message on nurse voicemail stating that some parts of the paperwork that Dr. Abner Greenspan filled out is inaccurate. She would like to if she needs to make an appointment to discuss this?

## 2012-07-10 NOTE — Telephone Encounter (Signed)
Please advise 

## 2012-07-11 ENCOUNTER — Ambulatory Visit: Payer: Federal, State, Local not specified - PPO

## 2012-07-11 NOTE — Telephone Encounter (Signed)
I called pt and she states she doesn't have the papers in front of her and that I woke her up so she can't think. Pt stated she would call the office later.

## 2012-07-14 NOTE — Telephone Encounter (Signed)
Pt returned my call on vm stating she needs a couple of sentences taken out.  I tried to call patient back but unable to reach pt. I left a message to return my call

## 2012-07-16 NOTE — Telephone Encounter (Signed)
Letter retyped with changes and pt informed.

## 2012-07-16 NOTE — Telephone Encounter (Signed)
Agree with changes to letter

## 2012-07-16 NOTE — Telephone Encounter (Signed)
Pt would like the sentence out of her letter stating she lost her job after a disagreement with a coworker and family acquaintance. Please advise?

## 2012-08-07 ENCOUNTER — Other Ambulatory Visit: Payer: Self-pay | Admitting: Family Medicine

## 2012-08-07 ENCOUNTER — Telehealth: Payer: Self-pay | Admitting: Family Medicine

## 2012-08-07 DIAGNOSIS — F411 Generalized anxiety disorder: Secondary | ICD-10-CM

## 2012-08-07 MED ORDER — ALPRAZOLAM 2 MG PO TABS: ORAL_TABLET | ORAL | Status: DC

## 2012-08-07 NOTE — Telephone Encounter (Signed)
Refill request for alprazolam Last filled by MD on -05/09/12 by Dr. Milinda Cave #[id number] [phone] Last seen-07/02/12 F/U-08/28/12 Please advise refill?

## 2012-08-07 NOTE — Telephone Encounter (Signed)
Patient called in stating that she is out alprazolam and would like a refill sometime today. She says that she is leaving tomorrow morning for vacation. [facility]

## 2012-08-07 NOTE — Telephone Encounter (Signed)
Refill done.  

## 2012-08-28 ENCOUNTER — Ambulatory Visit (INDEPENDENT_AMBULATORY_CARE_PROVIDER_SITE_OTHER): Payer: Federal, State, Local not specified - PPO | Admitting: Family Medicine

## 2012-08-28 ENCOUNTER — Encounter: Payer: Self-pay | Admitting: Family Medicine

## 2012-08-28 ENCOUNTER — Ambulatory Visit: Payer: Federal, State, Local not specified - PPO | Admitting: Family Medicine

## 2012-08-28 VITALS — BP 112/70 | HR 97 | Temp 98.3°F | Ht 64.5 in | Wt 176.1 lb

## 2012-08-28 DIAGNOSIS — F341 Dysthymic disorder: Secondary | ICD-10-CM

## 2012-08-28 DIAGNOSIS — F419 Anxiety disorder, unspecified: Secondary | ICD-10-CM

## 2012-08-28 DIAGNOSIS — F32A Depression, unspecified: Secondary | ICD-10-CM

## 2012-08-28 DIAGNOSIS — Z720 Tobacco use: Secondary | ICD-10-CM

## 2012-08-28 DIAGNOSIS — R4589 Other symptoms and signs involving emotional state: Secondary | ICD-10-CM

## 2012-08-28 DIAGNOSIS — Z309 Encounter for contraceptive management, unspecified: Secondary | ICD-10-CM

## 2012-08-28 DIAGNOSIS — F411 Generalized anxiety disorder: Secondary | ICD-10-CM

## 2012-08-28 DIAGNOSIS — F17200 Nicotine dependence, unspecified, uncomplicated: Secondary | ICD-10-CM

## 2012-08-28 MED ORDER — MEDROXYPROGESTERONE ACETATE 150 MG/ML IM SUSP: 150.0000 mg | INTRAMUSCULAR | Status: DC

## 2012-08-28 MED ORDER — MEDROXYPROGESTERONE ACETATE 150 MG/ML IM SUSP: 150.0000 mg | Freq: Once | INTRAMUSCULAR | Status: DC

## 2012-08-28 MED ORDER — FLUOXETINE HCL 40 MG PO CAPS: 40.0000 mg | ORAL_CAPSULE | Freq: Every day | ORAL | Status: DC

## 2012-08-28 MED ORDER — HYDROXYZINE PAMOATE 25 MG PO CAPS: 25.0000 mg | ORAL_CAPSULE | Freq: Three times a day (TID) | ORAL | Status: DC | PRN

## 2012-08-28 NOTE — Assessment & Plan Note (Signed)
Depo provera

## 2012-08-28 NOTE — Patient Instructions (Addendum)

## 2012-08-30 NOTE — Assessment & Plan Note (Signed)
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 Increase Prozac for now and continue Alprazolam prn.

## 2012-08-30 NOTE — Progress Notes (Signed)
Patient ID: Jillian Reyes, female   DOB: Apr 09, 1990, 22 y.o.   MRN: [id number] BONNETTA ALLBEE [id number] Dec 22, 1990 08/30/2012      Progress Note-Follow Up  Subjective  Chief Complaint  Chief Complaint  Patient presents with  . Follow-up    2 month    HPI  Patient is a 22 year old Caucasian female in today for followup. She continues to struggle with 5 of stress secondary to her struggles trying to keep her job at the [employer]. She feels anxious and irritable frequently. Has trouble concentrating. She has episodes of having trouble sleeping, palpitations and irritability. No recent illness, chest pain or shortness of breath. No GI or GU complaints  Past Medical History  Diagnosis Date  . Bipolar 1 disorder   . Chicken pox as a child  . Diabetes mellitus     gestational   . Back pain, lumbosacral   . Allergic state   . Abnormal cells of cervix   . Contraceptive management 01/06/2012  . Eczema 01/06/2012  . Acne 01/06/2012  . Mid back pain     History reviewed. No pertinent past surgical history.  Family History  Problem Relation Age of Onset  . Heart disease    . Diabetes Father 67    type 2  . Thyroid disease Father   . Hyperlipidemia      great grandparents  . Migraines Mother   . Kidney disease Mother     History   Social History  . Marital Status: Single    Spouse Name: N/A    Number of Children: 1  . Years of Education: N/A   Occupational History  . HR specialist     [employer]   Social History Main Topics  . Smoking status: Current Every Day Smoker -- 1.00 packs/day    Types: Cigarettes  . Smokeless tobacco: Never Used     Comment: wants chantix  . Alcohol Use: No  . Drug Use: No  . Sexually Active: Yes -- Female partner(s)    Birth Control/ Protection: Injection     Comment: depo shot   Other Topics Concern  . Not on file   Social History Narrative   Has a son. Living with her mother, stepfather, and brother and sister.     2 caffeinated drinks  daily.   On Depo for birth control.     Current Outpatient Prescriptions on File Prior to Visit  Medication Sig Dispense Refill  . alprazolam (XANAX) 2 MG tablet 1 tab po bid prn anxiety  40 tablet  1  . cetirizine (ZYRTEC) 10 MG tablet Take 1 tablet (10 mg total) by mouth daily as needed for allergies or rhinitis.  30 tablet  2  . clindamycin-benzoyl peroxide (BENZACLIN) gel APPLY TOPICALLY 2 (TWO) TIMES DAILY.  50 g  0  . clobetasol cream (TEMOVATE) 0.05 % Apply topically daily as needed. eczema  30 g  1  . cyclobenzaprine (FLEXERIL) 10 MG tablet Take 1 tablet (10 mg total) by mouth 3 (three) times daily as needed for muscle spasms.  30 tablet  0  . lamoTRIgine (LAMICTAL) 150 MG tablet TAKE 1 TABLET (150 MG TOTAL) BY MOUTH DAILY.  180 tablet  1  . minocycline (MINOCIN,DYNACIN) 100 MG capsule Take 1 capsule (100 mg total) by mouth 2 (two) times daily.  40 capsule  0  . Naproxen Sodium 220 MG CAPS Take 2 capsules (440 mg total) by mouth 2 (two) times daily as needed. X 3 days  then as needed with food  30 each  0  . Probiotic Product (MISC INTESTINAL FLORA REGULAT) CHEW Digestive Health probiotic gummies by Schiff    0  . [DISCONTINUED] naproxen (NAPROSYN) 375 MG tablet Take 1 tablet (375 mg total) by mouth 2 (two) times daily with a meal.  360 tablet  0   No current facility-administered medications on file prior to visit.    No Known Allergies  Review of Systems  Review of Systems  Constitutional: Negative for fever and malaise/fatigue.  HENT: Negative for congestion.   Eyes: Negative for discharge.  Respiratory: Negative for shortness of breath.   Cardiovascular: Negative for chest pain, palpitations and leg swelling.  Gastrointestinal: Negative for nausea, abdominal pain and diarrhea.  Genitourinary: Negative for dysuria.  Musculoskeletal: Negative for falls.  Skin: Negative for rash.  Neurological: Negative for loss of consciousness and headaches.  Endo/Heme/Allergies: Negative  for polydipsia.  Psychiatric/Behavioral: Positive for depression. Negative for suicidal ideas. The patient is nervous/anxious. The patient does not have insomnia.     Objective  BP 112/70  Pulse 97  Temp(Src) 98.3 F (36.8 C) (Oral)  Ht 5' 4.5" (1.638 m)  Wt 176 lb 1.3 oz (79.869 kg)  BMI 29.77 kg/m2  SpO2 96%  Physical Exam  Physical Exam  Constitutional: She is oriented to person, place, and time and well-developed, well-nourished, and in no distress. No distress.  HENT:  Head: Normocephalic and atraumatic.  Eyes: Conjunctivae are normal.  Neck: Neck supple. No thyromegaly present.  Cardiovascular: Normal rate, regular rhythm and normal heart sounds.   No murmur heard. Pulmonary/Chest: Effort normal and breath sounds normal. She has no wheezes.  Abdominal: She exhibits no distension and no mass.  Musculoskeletal: She exhibits no edema.  Lymphadenopathy:    She has no cervical adenopathy.  Neurological: She is alert and oriented to person, place, and time.  Skin: Skin is warm and dry. No rash noted. She is not diaphoretic.  Psychiatric: Memory, affect and judgment normal.      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 Increase Prozac for now and continue Alprazolam prn.   Tobacco abuse Continues to smoke small amount

## 2012-08-30 NOTE — Assessment & Plan Note (Signed)
Continues to smoke small amount 

## 2012-10-07 ENCOUNTER — Other Ambulatory Visit: Payer: Self-pay | Admitting: Family Medicine

## 2012-10-07 NOTE — Telephone Encounter (Signed)
RX faxed

## 2012-10-07 NOTE — Telephone Encounter (Signed)
Please advise med refill? Last RX was done on 08-07-12 quantity 40 with 1 refill  If ok fax to [phone]

## 2012-10-08 ENCOUNTER — Ambulatory Visit: Payer: Federal, State, Local not specified - PPO | Admitting: Family Medicine

## 2012-10-15 ENCOUNTER — Ambulatory Visit (INDEPENDENT_AMBULATORY_CARE_PROVIDER_SITE_OTHER): Payer: Federal, State, Local not specified - PPO | Admitting: Family Medicine

## 2012-10-15 ENCOUNTER — Encounter: Payer: Self-pay | Admitting: Family Medicine

## 2012-10-15 VITALS — BP 104/62 | HR 82 | Temp 98.2°F | Ht 64.5 in | Wt 171.0 lb

## 2012-10-15 DIAGNOSIS — S8011XA Contusion of right lower leg, initial encounter: Secondary | ICD-10-CM

## 2012-10-15 DIAGNOSIS — F319 Bipolar disorder, unspecified: Secondary | ICD-10-CM

## 2012-10-15 DIAGNOSIS — Z720 Tobacco use: Secondary | ICD-10-CM

## 2012-10-15 DIAGNOSIS — S8010XA Contusion of unspecified lower leg, initial encounter: Secondary | ICD-10-CM

## 2012-10-15 DIAGNOSIS — F17200 Nicotine dependence, unspecified, uncomplicated: Secondary | ICD-10-CM

## 2012-10-15 NOTE — Assessment & Plan Note (Signed)
Doing better on increased Fluoxetine, taking Alprazolam mostly for sleep. Uses it infrequently during the day.

## 2012-10-15 NOTE — Patient Instructions (Addendum)
Allergic Rhinitis Allergic rhinitis is when the mucous membranes in the nose respond to allergens. Allergens are particles in the air that cause your body to have an allergic reaction. This causes you to release allergic antibodies. Through a chain of events, these eventually cause you to release histamine into the blood stream (hence the use of antihistamines). Although meant to be protective to the body, it is this release that causes your discomfort, such as frequent sneezing, congestion and an itchy runny nose.  CAUSES  The pollen allergens may come from grasses, trees, and weeds. This is seasonal allergic rhinitis, or "hay fever." Other allergens cause year-round allergic rhinitis (perennial allergic rhinitis) such as house dust mite allergen, pet dander and mold spores.  SYMPTOMS   Nasal stuffiness (congestion).  Runny, itchy nose with sneezing and tearing of the eyes.  There is often an itching of the mouth, eyes and ears. It cannot be cured, but it can be controlled with medications. DIAGNOSIS  If you are unable to determine the offending allergen, skin or blood testing may find it. TREATMENT   Avoid the allergen.  Medications and allergy shots (immunotherapy) can help.  Hay fever may often be treated with antihistamines in pill or nasal spray forms. Antihistamines block the effects of histamine. There are over-the-counter medicines that may help with nasal congestion and swelling around the eyes. Check with your caregiver before taking or giving this medicine. If the treatment above does not work, there are many new medications your caregiver can prescribe. Stronger medications may be used if initial measures are ineffective. Desensitizing injections can be used if medications and avoidance fails. Desensitization is when a patient is given ongoing shots until the body becomes less sensitive to the allergen. Make sure you follow up with your caregiver if problems continue. SEEK MEDICAL  CARE IF:   You develop fever (more than 100.5 F (38.1 C).  You develop a cough that does not stop easily (persistent).  You have shortness of breath.  You start wheezing.  Symptoms interfere with normal daily activities. Document Released: 11/28/2000 Document Revised: 05/28/2011 Document Reviewed: 06/09/2008 [employer] Patient Information 2014 [employer].  

## 2012-10-15 NOTE — Assessment & Plan Note (Signed)
Fell at work in mid July and continues to have bruising and swelling at ankle and anterior tibial plateau, slowly improving, encouraged

## 2012-10-16 NOTE — Assessment & Plan Note (Signed)
Encouraged smoking cessation once again, patient reports she has made some attempts to cut down

## 2012-10-16 NOTE — Progress Notes (Signed)
Patient ID: Jillian Reyes, female   DOB: May 15, 1990, 22 y.o.   MRN: [id number] Jillian Reyes [id number] [birth year] 10/16/2012      Progress Note-Follow Up  Subjective  Chief Complaint  Chief Complaint  Patient presents with  . Follow-up    6 week    HPI  The patient is a 22 year old Caucasian female who is in today for followup she feels better since increasing the Prozac. More calm and less anxious. No suicidal ideation. Otherwise she reports she's generally doing well. No recent illness. No shortness of breath, palpitations, GI or GU concerns noted today.  Past Medical History  Diagnosis Date  . Bipolar 1 disorder   . Chicken pox as a child  . Diabetes mellitus     gestational   . Back pain, lumbosacral   . Allergic state   . Abnormal cells of cervix   . Contraceptive management 01/06/2012  . Eczema 01/06/2012  . Acne 01/06/2012  . Mid back pain   . Contusion of leg 10/15/2012    right    History reviewed. No pertinent past surgical history.  Family History  Problem Relation Age of Onset  . Heart disease    . Diabetes Father 76    type 2  . Thyroid disease Father   . Hyperlipidemia      great grandparents  . Migraines Mother   . Kidney disease Mother     History   Social History  . Marital Status: Single    Spouse Name: N/A    Number of Children: 1  . Years of Education: N/A   Occupational History  . HR specialist     [employer]   Social History Main Topics  . Smoking status: Current Every Day Smoker -- 1.00 packs/day    Types: Cigarettes  . Smokeless tobacco: Never Used     Comment: wants chantix  . Alcohol Use: No  . Drug Use: No  . Sexually Active: Yes -- Female partner(s)    Birth Control/ Protection: Injection     Comment: depo shot   Other Topics Concern  . Not on file   Social History Narrative   Has a son. Living with her mother, stepfather, and brother and sister.     2 caffeinated drinks daily.   On Depo for birth control.     Current  Outpatient Prescriptions on File Prior to Visit  Medication Sig Dispense Refill  . alprazolam (XANAX) 2 MG tablet TAKE 1 TABLET BY MOUTH TWICE A DAY AS NEEDED FOR ANXIETY  40 tablet  1  . cetirizine (ZYRTEC) 10 MG tablet Take 1 tablet (10 mg total) by mouth daily as needed for allergies or rhinitis.  30 tablet  2  . cyclobenzaprine (FLEXERIL) 10 MG tablet Take 1 tablet (10 mg total) by mouth 3 (three) times daily as needed for muscle spasms.  30 tablet  0  . FLUoxetine (PROZAC) 40 MG capsule Take 1 capsule (40 mg total) by mouth daily.  90 capsule  3  . hydrOXYzine (VISTARIL) 25 MG capsule Take 1 capsule (25 mg total) by mouth 3 (three) times daily as needed for anxiety.  60 capsule  1  . lamoTRIgine (LAMICTAL) 150 MG tablet TAKE 1 TABLET (150 MG TOTAL) BY MOUTH DAILY.  180 tablet  1  . medroxyPROGESTERone (DEPO-PROVERA) 150 MG/ML injection Inject 1 mL (150 mg total) into the muscle every 3 (three) months.  1 mL  3  . Naproxen Sodium 220 MG CAPS Take  2 capsules (440 mg total) by mouth 2 (two) times daily as needed. X 3 days then as needed with food  30 each  0  . Probiotic Product (MISC INTESTINAL FLORA REGULAT) CHEW Digestive Health probiotic gummies by Schiff    0  . clindamycin-benzoyl peroxide (BENZACLIN) gel APPLY TOPICALLY 2 (TWO) TIMES DAILY.  50 g  0  . clobetasol cream (TEMOVATE) 0.05 % Apply topically daily as needed. eczema  30 g  1  . minocycline (MINOCIN,DYNACIN) 100 MG capsule Take 1 capsule (100 mg total) by mouth 2 (two) times daily.  40 capsule  0  . [DISCONTINUED] naproxen (NAPROSYN) 375 MG tablet Take 1 tablet (375 mg total) by mouth 2 (two) times daily with a meal.  360 tablet  0   No current facility-administered medications on file prior to visit.    No Known Allergies  Review of Systems  Review of Systems  Constitutional: Negative for fever and malaise/fatigue.  HENT: Negative for congestion.   Eyes: Negative for discharge.  Respiratory: Negative for shortness of  breath.   Cardiovascular: Negative for chest pain, palpitations and leg swelling.  Gastrointestinal: Negative for nausea, abdominal pain and diarrhea.  Genitourinary: Negative for dysuria.  Musculoskeletal: Negative for falls.  Skin: Negative for rash.  Neurological: Negative for loss of consciousness and headaches.  Endo/Heme/Allergies: Negative for polydipsia.  Psychiatric/Behavioral: Negative for depression and suicidal ideas. The patient is not nervous/anxious and does not have insomnia.     Objective  BP 104/62  Pulse 82  Temp(Src) 98.2 F (36.8 C) (Oral)  Ht 5' 4.5" (1.638 m)  Wt 171 lb 0.6 oz (77.583 kg)  BMI 28.92 kg/m2  SpO2 97%  Physical Exam  Physical Exam  Constitutional: She is oriented to person, place, and time and well-developed, well-nourished, and in no distress. No distress.  HENT:  Head: Normocephalic and atraumatic.  Eyes: Conjunctivae are normal.  Neck: Neck supple. No thyromegaly present.  Cardiovascular: Normal rate, regular rhythm and normal heart sounds.   No murmur heard. Pulmonary/Chest: Effort normal and breath sounds normal. She has no wheezes.  Abdominal: She exhibits no distension and no mass.  Musculoskeletal: She exhibits no edema.  Lymphadenopathy:    She has no cervical adenopathy.  Neurological: She is alert and oriented to person, place, and time.  Skin: Skin is warm and dry. No rash noted. She is not diaphoretic.  Psychiatric: Memory, affect and judgment normal.      Assessment & Plan  Bipolar 1 disorder Doing better on increased Fluoxetine, taking Alprazolam mostly for sleep. Uses it infrequently during the day.  Contusion of leg Fell at work in mid July and continues to have bruising and swelling at ankle and anterior tibial plateau, slowly improving, encouraged   Tobacco abuse Encouraged smoking cessation once again, patient reports she has made some attempts to cut down

## 2012-11-10 ENCOUNTER — Encounter: Payer: Self-pay | Admitting: Family

## 2012-11-10 ENCOUNTER — Ambulatory Visit (INDEPENDENT_AMBULATORY_CARE_PROVIDER_SITE_OTHER): Payer: Federal, State, Local not specified - PPO | Admitting: Family

## 2012-11-10 ENCOUNTER — Ambulatory Visit: Payer: Federal, State, Local not specified - PPO | Admitting: Family

## 2012-11-10 VITALS — BP 110/70 | HR 88 | Temp 98.6°F | Resp 16 | Wt 170.0 lb

## 2012-11-10 DIAGNOSIS — J029 Acute pharyngitis, unspecified: Secondary | ICD-10-CM

## 2012-11-10 LAB — POCT RAPID STREP A (OFFICE): Rapid Strep A Screen: NEGATIVE

## 2012-11-10 MED ORDER — AMOXICILLIN 500 MG PO CAPS: 1000.0000 mg | ORAL_CAPSULE | Freq: Three times a day (TID) | ORAL | Status: DC

## 2012-11-10 NOTE — Progress Notes (Signed)
Subjective:    Patient ID: Jillian Reyes, female    DOB: October 26, 1990, 22 y.o.   MRN: [id number]  HPI  Jillian Reyes is a 22 yr old female who presents today with chief complaint of sore throat.  Reports that symptoms started on Saturday.  Worsened this AM. + chills no fever.  Family members diagnosed with strep.    Review of Systems See HPI  Past Medical History  Diagnosis Date  . Bipolar 1 disorder   . Chicken pox as a child  . Diabetes mellitus     gestational   . Back pain, lumbosacral   . Allergic state   . Abnormal cells of cervix   . Contraceptive management 01/06/2012  . Eczema 01/06/2012  . Acne 01/06/2012  . Mid back pain   . Contusion of leg 10/15/2012    right    History   Social History  . Marital Status: Single    Spouse Name: N/A    Number of Children: 1  . Years of Education: N/A   Occupational History  . HR specialist     [employer]   Social History Main Topics  . Smoking status: Current Every Day Smoker -- 1.00 packs/day    Types: Cigarettes  . Smokeless tobacco: Never Used     Comment: wants chantix  . Alcohol Use: No  . Drug Use: No  . Sexual Activity: Yes    Partners: Male    Birth Control/ Protection: Injection     Comment: depo shot   Other Topics Concern  . Not on file   Social History Narrative   Has a son. Living with her mother, stepfather, and brother and sister.     2 caffeinated drinks daily.   On Depo for birth control.     No past surgical history on file.  Family History  Problem Relation Age of Onset  . Heart disease    . Diabetes Father 44    type 2  . Thyroid disease Father   . Hyperlipidemia      great grandparents  . Migraines Mother   . Kidney disease Mother     No Known Allergies  Current Outpatient Prescriptions on File Prior to Visit  Medication Sig Dispense Refill  . alprazolam (XANAX) 2 MG tablet TAKE 1 TABLET BY MOUTH TWICE A DAY AS NEEDED FOR ANXIETY  40 tablet  1  . cetirizine (ZYRTEC) 10 MG tablet Take  1 tablet (10 mg total) by mouth daily as needed for allergies or rhinitis.  30 tablet  2  . clindamycin-benzoyl peroxide (BENZACLIN) gel APPLY TOPICALLY 2 (TWO) TIMES DAILY.  50 g  0  . clobetasol cream (TEMOVATE) 0.05 % Apply topically daily as needed. eczema  30 g  1  . cyclobenzaprine (FLEXERIL) 10 MG tablet Take 1 tablet (10 mg total) by mouth 3 (three) times daily as needed for muscle spasms.  30 tablet  0  . FLUoxetine (PROZAC) 40 MG capsule Take 1 capsule (40 mg total) by mouth daily.  90 capsule  3  . hydrOXYzine (VISTARIL) 25 MG capsule Take 1 capsule (25 mg total) by mouth 3 (three) times daily as needed for anxiety.  60 capsule  1  . lamoTRIgine (LAMICTAL) 150 MG tablet TAKE 1 TABLET (150 MG TOTAL) BY MOUTH DAILY.  180 tablet  1  . medroxyPROGESTERone (DEPO-PROVERA) 150 MG/ML injection Inject 1 mL (150 mg total) into the muscle every 3 (three) months.  1 mL  3  .  Probiotic Product (MISC INTESTINAL FLORA REGULAT) CHEW Digestive Health probiotic gummies by [employer]iff    0  . [DISCONTINUED] naproxen (NAPROSYN) 375 MG tablet Take 1 tablet (375 mg total) by mouth 2 (two) times daily with a meal.  360 tablet  0   No current facility-administered medications on file prior to visit.    BP 110/70  Pulse 88  Temp(Src) 98.6 F (37 C) (Oral)  Resp 16  Wt 170 lb (77.111 kg)  BMI 28.74 kg/m2  SpO2 97%       Objective:   Physical Exam  Constitutional: She is oriented to person, place, and time. She appears well-developed and well-nourished. No distress.  HENT:  Head: Normocephalic and atraumatic.  Right Ear: Tympanic membrane and ear canal normal.  Left Ear: Tympanic membrane and ear canal normal.  Mouth/Throat: Posterior oropharyngeal erythema present. No oropharyngeal exudate or posterior oropharyngeal edema.  Voice was hoarse  Cardiovascular: Normal rate and regular rhythm.   No murmur heard. Pulmonary/Chest: Effort normal and breath sounds normal. No respiratory distress. She has no  wheezes. She has no rales. She exhibits no tenderness.  Lymphadenopathy:    She has cervical adenopathy.  Neurological: She is alert and oriented to person, place, and time.  Psychiatric: She has a normal mood and affect. Her behavior is normal. Judgment and thought content normal.          Assessment & Plan:

## 2012-11-10 NOTE — Assessment & Plan Note (Addendum)
Rapid strep negative.  Will rx with amoxicillin empirically while we wait on strep probe. Recommended supportive measures as outlined in AVS.

## 2012-11-10 NOTE — Patient Instructions (Addendum)
Start amoxicillin. We will contact you with your results. You may use ibuprofen 400mg  every 6 hours as needed for pain/comfort.  You may also use cepacol lozenges as needed.   Call if symptoms worsen, or if not improved in 2-3 days.

## 2012-12-22 ENCOUNTER — Other Ambulatory Visit: Payer: Self-pay | Admitting: Family Medicine

## 2012-12-22 MED ORDER — ALPRAZOLAM 2 MG PO TABS: 2.0000 mg | ORAL_TABLET | Freq: Two times a day (BID) | ORAL | Status: DC | PRN

## 2012-12-22 NOTE — Telephone Encounter (Signed)
Will fax RX in the morning

## 2012-12-22 NOTE — Telephone Encounter (Signed)
Pt left a message stating that she needed a medication refilled (couldn't catch the name).  Left a detailed message for patient to return my call

## 2012-12-22 NOTE — Addendum Note (Signed)
Addended by: Court Joy on: 12/22/2012 05:06 PM   Modules accepted: Orders

## 2012-12-22 NOTE — Telephone Encounter (Signed)
Please advise refill? Last RX was done on 10-07-12 quantity 40 with 1 refill  If ok fax to [phone]

## 2013-02-17 ENCOUNTER — Ambulatory Visit (INDEPENDENT_AMBULATORY_CARE_PROVIDER_SITE_OTHER): Payer: Federal, State, Local not specified - PPO | Admitting: Family Medicine

## 2013-02-17 ENCOUNTER — Encounter: Payer: Self-pay | Admitting: Family Medicine

## 2013-02-17 VITALS — BP 100/70 | HR 98 | Temp 98.0°F | Ht 64.5 in | Wt 183.0 lb

## 2013-02-17 DIAGNOSIS — L708 Other acne: Secondary | ICD-10-CM

## 2013-02-17 DIAGNOSIS — Z309 Encounter for contraceptive management, unspecified: Secondary | ICD-10-CM

## 2013-02-17 DIAGNOSIS — Z0000 Encounter for general adult medical examination without abnormal findings: Secondary | ICD-10-CM

## 2013-02-17 DIAGNOSIS — L259 Unspecified contact dermatitis, unspecified cause: Secondary | ICD-10-CM

## 2013-02-17 DIAGNOSIS — Z720 Tobacco use: Secondary | ICD-10-CM

## 2013-02-17 DIAGNOSIS — Z5189 Encounter for other specified aftercare: Secondary | ICD-10-CM

## 2013-02-17 DIAGNOSIS — R05 Cough: Secondary | ICD-10-CM

## 2013-02-17 DIAGNOSIS — T7840XD Allergy, unspecified, subsequent encounter: Secondary | ICD-10-CM

## 2013-02-17 DIAGNOSIS — F319 Bipolar disorder, unspecified: Secondary | ICD-10-CM

## 2013-02-17 DIAGNOSIS — L709 Acne, unspecified: Secondary | ICD-10-CM

## 2013-02-17 DIAGNOSIS — E785 Hyperlipidemia, unspecified: Secondary | ICD-10-CM

## 2013-02-17 DIAGNOSIS — F17200 Nicotine dependence, unspecified, uncomplicated: Secondary | ICD-10-CM

## 2013-02-17 DIAGNOSIS — R059 Cough, unspecified: Secondary | ICD-10-CM

## 2013-02-17 DIAGNOSIS — L309 Dermatitis, unspecified: Secondary | ICD-10-CM

## 2013-02-17 DIAGNOSIS — G4700 Insomnia, unspecified: Secondary | ICD-10-CM

## 2013-02-17 LAB — TSH: TSH: 0.975 u[IU]/mL (ref 0.350–4.500)

## 2013-02-17 LAB — CBC
Platelets: 247 10*3/uL (ref 150–400)
RBC: 4.99 MIL/uL (ref 3.87–5.11)
WBC: 7.6 10*3/uL (ref 4.0–10.5)

## 2013-02-17 LAB — RENAL FUNCTION PANEL
CO2: 26 mEq/L (ref 19–32)
Chloride: 107 mEq/L (ref 96–112)
Potassium: 4.2 mEq/L (ref 3.5–5.3)
Sodium: 140 mEq/L (ref 135–145)

## 2013-02-17 LAB — LIPID PANEL
LDL Cholesterol: 68 mg/dL (ref 0–99)
VLDL: 30 mg/dL (ref 0–40)

## 2013-02-17 LAB — HEPATIC FUNCTION PANEL
AST: 17 U/L (ref 0–37)
Albumin: 4.3 g/dL (ref 3.5–5.2)
Alkaline Phosphatase: 76 U/L (ref 39–117)
Indirect Bilirubin: 0.4 mg/dL (ref 0.0–0.9)
Total Protein: 6.8 g/dL (ref 6.0–8.3)

## 2013-02-17 MED ORDER — AZITHROMYCIN 250 MG PO TABS: ORAL_TABLET | ORAL | Status: DC

## 2013-02-17 MED ORDER — DESOGESTREL-ETHINYL ESTRADIOL 0.15-0.02/0.01 MG (21/5) PO TABS: 1.0000 | ORAL_TABLET | Freq: Every day | ORAL | Status: DC

## 2013-02-17 MED ORDER — CLINDAMYCIN PHOS-BENZOYL PEROX 1-5 % EX GEL: Freq: Two times a day (BID) | CUTANEOUS | Status: DC

## 2013-02-17 MED ORDER — ALPRAZOLAM 2 MG PO TABS: 2.0000 mg | ORAL_TABLET | Freq: Two times a day (BID) | ORAL | Status: DC | PRN

## 2013-02-17 MED ORDER — CLOBETASOL PROPIONATE 0.05 % EX CREA: TOPICAL_CREAM | Freq: Every day | CUTANEOUS | Status: DC | PRN

## 2013-02-17 NOTE — Progress Notes (Signed)
Pre visit review using our clinic review tool, if applicable. No additional management support is needed unless otherwise documented below in the visit note. 

## 2013-02-17 NOTE — Progress Notes (Signed)
Patient ID: Jillian Reyes, female   DOB: 1990-04-24, 22 y.o.   MRN: [id number] Jillian Reyes [id number] 20-Aug-1990 02/17/2013      Progress Note-Follow Up  Subjective  Chief Complaint  Chief Complaint  Patient presents with  . Follow-up    HPI  Patient is a 22 year old female in today for followup. Generally doing well. Unfortunately continues to smoke a half pack per day. Has a cough with morning generally dry avoid using productive of colored sputum in the morning. No fevers or chills. No significant congestion or allergies. Bipolar is well controlled and she is tolerating current meds. No chest pain, palpitations or shortness of breath. No GI or GU concerns.  Past Medical History  Diagnosis Date  . Bipolar 1 disorder   . Chicken pox as a child  . Diabetes mellitus     gestational   . Back pain, lumbosacral   . Allergic state   . Abnormal cells of cervix   . Contraceptive management 01/06/2012  . Eczema 01/06/2012  . Acne 01/06/2012  . Mid back pain   . Contusion of leg 10/15/2012    right    History reviewed. No pertinent past surgical history.  Family History  Problem Relation Age of Onset  . Heart disease    . Diabetes Father 71    type 2  . Thyroid disease Father   . Hyperlipidemia      great grandparents  . Migraines Mother   . Kidney disease Mother     History   Social History  . Marital Status: Single    Spouse Name: N/A    Number of Children: 1  . Years of Education: N/A   Occupational History  . HR specialist     [employer]   Social History Main Topics  . Smoking status: Current Every Day Smoker -- 1.00 packs/day    Types: Cigarettes  . Smokeless tobacco: Never Used     Comment: wants chantix  . Alcohol Use: No  . Drug Use: No  . Sexual Activity: Yes    Partners: Male    Birth Control/ Protection: Injection     Comment: depo shot   Other Topics Concern  . Not on file   Social History Narrative   Has a son. Living with her mother, stepfather,  and brother and sister.     2 caffeinated drinks daily.   On Depo for birth control.     Current Outpatient Prescriptions on File Prior to Visit  Medication Sig Dispense Refill  . hydrOXYzine (VISTARIL) 25 MG capsule Take 1 capsule (25 mg total) by mouth 3 (three) times daily as needed for anxiety.  60 capsule  1  . lamoTRIgine (LAMICTAL) 150 MG tablet TAKE 1 TABLET (150 MG TOTAL) BY MOUTH DAILY.  180 tablet  1  . Probiotic Product (MISC INTESTINAL FLORA REGULAT) CHEW Digestive Health probiotic gummies by Schiff    0  . [DISCONTINUED] naproxen (NAPROSYN) 375 MG tablet Take 1 tablet (375 mg total) by mouth 2 (two) times daily with a meal.  360 tablet  0   No current facility-administered medications on file prior to visit.    No Known Allergies  Review of Systems  Review of Systems  Constitutional: Positive for malaise/fatigue. Negative for fever.  HENT: Positive for congestion.   Eyes: Negative for discharge.  Respiratory: Positive for cough. Negative for shortness of breath.   Cardiovascular: Negative for chest pain, palpitations and leg swelling.  Gastrointestinal: Negative for nausea,  abdominal pain and diarrhea.  Genitourinary: Negative for dysuria.  Musculoskeletal: Negative for falls.  Skin: Negative for rash.  Neurological: Negative for loss of consciousness and headaches.  Endo/Heme/Allergies: Negative for polydipsia.  Psychiatric/Behavioral: Negative for depression and suicidal ideas. The patient has insomnia. The patient is not nervous/anxious.     Objective  BP 100/70  Pulse 98  Temp(Src) 98 F (36.7 C) (Oral)  Ht 5' 4.5" (1.638 m)  Wt 183 lb (83.008 kg)  BMI 30.94 kg/m2  SpO2 95%  Physical Exam  Physical Exam  Constitutional: She is oriented to person, place, and time and well-developed, well-nourished, and in no distress. No distress.  HENT:  Head: Normocephalic and atraumatic.  Eyes: Conjunctivae are normal.  Neck: Neck supple. No thyromegaly present.   Cardiovascular: Normal rate, regular rhythm and normal heart sounds.   No murmur heard. Pulmonary/Chest: Effort normal and breath sounds normal. She has no wheezes.  Abdominal: She exhibits no distension and no mass.  Musculoskeletal: She exhibits no edema.  Lymphadenopathy:    She has no cervical adenopathy.  Neurological: She is alert and oriented to person, place, and time.  Skin: Skin is warm and dry. No rash noted. She is not diaphoretic.  Psychiatric: Memory, affect and judgment normal.      Assessment & Plan  Tobacco abuse Still smoking 1/2 ppd, encouraged complete cessation, discussed strategies for greater than 3 minutes.   Dyslipidemia Mildly depressed hdl, needs increased exercise and avoid trans fats  Allergic state No recent flares.  Bipolar 1 disorder Working full time and doing fairly well on current meds.

## 2013-02-17 NOTE — Patient Instructions (Signed)
Probiotic daily and Mucinex prn  Nicotine Addiction Nicotine can act as both a stimulant (excites/activates) and a sedative (calms/quiets). Immediately after exposure to nicotine, there is a "kick" caused in part by the drug's stimulation of the adrenal glands and resulting discharge of adrenaline (epinephrine). The rush of adrenaline stimulates the body and causes a sudden release of sugar. This means that smokers are always slightly hyperglycemic. Hyperglycemic means that the blood sugar is high, just like in diabetics. Nicotine also decreases the amount of insulin which helps control sugar levels in the body. There is an increase in blood pressure, breathing, and the rate of heart beats.  In addition, nicotine indirectly causes a release of dopamine in the brain that controls pleasure and motivation. A similar reaction is seen with other drugs of abuse, such as cocaine and heroin. This dopamine release is thought to cause the pleasurable sensations when smoking. In some different cases, nicotine can also create a calming effect, depending on sensitivity of the smoker's nervous system and the dose of nicotine taken. WHAT HAPPENS WHEN NICOTINE IS TAKEN FOR LONG PERIODS OF TIME?  Long-term use of nicotine results in addiction. It is difficult to stop.  Repeated use of nicotine creates tolerance. Higher doses of nicotine are needed to get the "kick." When nicotine use is stopped, withdrawal may last a month or more. Withdrawal may begin within a few hours after the last cigarette. Symptoms peak within the first few days and may lessen within a few weeks. For some people, however, symptoms may last for months or longer. Withdrawal symptoms include:   Irritability.  Craving.  Learning and attention deficits.  Sleep disturbances.  Increased appetite. Craving for tobacco may last for 6 months or longer. Many behaviors done while using nicotine can also play a part in the severity of withdrawal  symptoms. For some people, the feel, smell, and sight of a cigarette and the ritual of obtaining, handling, lighting, and smoking the cigarette are closely linked with the pleasure of smoking. When stopped, they also miss the related behaviors which make the withdrawal or craving worse. While nicotine gum and patches may lessen the drug aspects of withdrawal, cravings often persist. WHAT ARE THE MEDICAL CONSEQUENCES OF NICOTINE USE?  Nicotine addiction accounts for one-third of all cancers. The top cancer caused by tobacco is lung cancer. Lung cancer is the number one cancer killer of both men and women.  Smoking is also associated with cancers of the:  Mouth.  Pharynx.  Larynx.  Esophagus.  Stomach.  Pancreas.  Cervix.  Kidney.  Ureter.  Bladder.  Smoking also causes lung diseases such as lasting (chronic) bronchitis and emphysema.  It worsens asthma in adults and children.  Smoking increases the risk of heart disease, including:  Stroke.  Heart attack.  Vascular disease.  Aneurysm.  Passive or secondary smoke can also increase medical risks including:  Asthma in children.  Sudden Infant Death Syndrome (SIDS).  Additionally, dropped cigarettes are the leading cause of residential fire fatalities.  Nicotine poisoning has been reported from accidental ingestion of tobacco products by children and pets. Death usually results in a few minutes from respiratory failure (when a person stops breathing) caused by paralysis. TREATMENT   Medication. Nicotine replacement medicines such as nicotine gum and the patch are used to stop smoking. These medicines gradually lower the dosage of nicotine in the body. These medicines do not contain the carbon monoxide and other toxins found in tobacco smoke.  Hypnotherapy.  Relaxation therapy.  Nicotine Anonymous (a 12-step support program). Find times and locations in your local yellow pages. Document Released: 11/09/2003  Document Revised: 05/28/2011 Document Reviewed: 04/02/2007 [facility] Patient Information 2014 [place], [place].

## 2013-02-18 ENCOUNTER — Encounter: Payer: Self-pay | Admitting: Family Medicine

## 2013-02-18 DIAGNOSIS — E785 Hyperlipidemia, unspecified: Secondary | ICD-10-CM | POA: Insufficient documentation

## 2013-02-18 HISTORY — DX: Hyperlipidemia, unspecified: E78.5

## 2013-02-18 NOTE — Assessment & Plan Note (Signed)
Working full time and doing fairly well on current meds.

## 2013-02-18 NOTE — Assessment & Plan Note (Signed)
Mildly depressed hdl, needs increased exercise and avoid trans fats

## 2013-02-18 NOTE — Assessment & Plan Note (Signed)
Still smoking 1/2 ppd, encouraged complete cessation, discussed strategies for greater than 3 minutes.

## 2013-02-18 NOTE — Assessment & Plan Note (Signed)
No recent flares 

## 2013-03-06 ENCOUNTER — Ambulatory Visit (INDEPENDENT_AMBULATORY_CARE_PROVIDER_SITE_OTHER): Payer: Federal, State, Local not specified - PPO | Admitting: Physician Assistant

## 2013-03-06 ENCOUNTER — Encounter: Payer: Self-pay | Admitting: Physician Assistant

## 2013-03-06 VITALS — BP 110/74 | HR 82 | Temp 98.2°F | Resp 16 | Ht 64.5 in | Wt 184.5 lb

## 2013-03-06 DIAGNOSIS — M67911 Unspecified disorder of synovium and tendon, right shoulder: Secondary | ICD-10-CM

## 2013-03-06 DIAGNOSIS — M6790 Unspecified disorder of synovium and tendon, unspecified site: Secondary | ICD-10-CM

## 2013-03-06 MED ORDER — METHOCARBAMOL 500 MG PO TABS: 500.0000 mg | ORAL_TABLET | Freq: Three times a day (TID) | ORAL | Status: DC

## 2013-03-06 MED ORDER — TRAMADOL HCL 50 MG PO TABS: 50.0000 mg | ORAL_TABLET | Freq: Three times a day (TID) | ORAL | Status: DC | PRN

## 2013-03-06 NOTE — Patient Instructions (Signed)
Please take medications as prescribed.  Avoid heavy lifting or overhead motion of right arm.  Topical Aspercreme. If symptoms not improving, will need to obtain imaging and possible referral to orthopedics.

## 2013-03-06 NOTE — Progress Notes (Signed)
Pre visit review using our clinic review tool, if applicable. No additional management support is needed unless otherwise documented below in the visit note/SLS  

## 2013-03-06 NOTE — Progress Notes (Signed)
Patient ID: Jillian Reyes, female   DOB: 02-25-1991, 22 y.o.   MRN: [id number]  Patient presents to clinic today complaining of right shoulder pain. Patient states she has had intermittent and infrequent right shoulder pain over the past 3 months. Has had more frequent pain over the past 2 weeks, that is more severe in nature. 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 Patient is right handed. Patient denies numbness or tingling of right upper extremity. Denies neck pain. Denies trauma. Does endorse heavy lifting. Patient has tried over-the-counter medications with minimal relief of symptoms.   Past Medical History  Diagnosis Date  . Bipolar 1 disorder   . Chicken pox as a child  . Diabetes mellitus     gestational   . Back pain, lumbosacral   . Allergic state   . Abnormal cells of cervix   . Contraceptive management 01/06/2012  . Eczema 01/06/2012  . Acne 01/06/2012  . Mid back pain   . Contusion of leg 10/15/2012    right  . Dyslipidemia 02/18/2013    Current Outpatient Prescriptions on File Prior to Visit  Medication Sig Dispense Refill  . alprazolam (XANAX) 2 MG tablet Take 1 tablet (2 mg total) by mouth 2 (two) times daily as needed for sleep or anxiety.  40 tablet  3  . clindamycin-benzoyl peroxide (BENZACLIN) gel Apply topically 2 (two) times daily.  50 g  5  . clobetasol cream (TEMOVATE) 0.05 % Apply topically daily as needed. eczema  60 g  1  . desogestrel-ethinyl estradiol (KARIVA) 0.15-0.02/0.01 MG (21/5) tablet Take 1 tablet by mouth daily.  3 Package  1  . hydrOXYzine (VISTARIL) 25 MG capsule Take 1 capsule (25 mg total) by mouth 3 (three) times daily as needed for anxiety.  60 capsule  1  . lamoTRIgine (LAMICTAL) 150 MG tablet TAKE 1 TABLET (150 MG TOTAL) BY MOUTH DAILY.  180 tablet  1  . Probiotic Product (MISC INTESTINAL FLORA REGULAT) CHEW Digestive Health probiotic gummies by Schiff    0  . azithromycin (ZITHROMAX) 250  MG tablet 2 TABS PO once then 1 tab po daily x 4 days  6 tablet  0  . [DISCONTINUED] naproxen (NAPROSYN) 375 MG tablet Take 1 tablet (375 mg total) by mouth 2 (two) times daily with a meal.  360 tablet  0   No current facility-administered medications on file prior to visit.    No Known Allergies  Family History  Problem Relation Age of Onset  . Heart disease    . Diabetes Father 28    type 2  . Thyroid disease Father   . Hyperlipidemia      great grandparents  . Migraines Mother   . Kidney disease Mother     History   Social History  . Marital Status: Single    Spouse Name: N/A    Number of Children: 1  . Years of Education: N/A   Occupational History  . HR specialist     [employer]   Social History Main Topics  . Smoking status: Current Every Day Smoker -- 1.00 packs/day    Types: Cigarettes  . Smokeless tobacco: Never Used     Comment: wants chantix  . Alcohol Use: No  . Drug Use: No  . Sexual Activity: Yes    Partners: Male    Birth Control/ Protection: Injection     Comment: depo shot   Other Topics Concern  .  None   Social History Narrative   Has a son. Living with her mother, stepfather, and brother and sister.     2 caffeinated drinks daily.   On Depo for birth control.     Review of Systems - see history of present illness. All other review of systems are negative.   Filed Vitals:   03/06/13 1310  BP: 110/74  Pulse: 82  Temp: 98.2 F (36.8 C)  Resp: 16    Physical Exam  Vitals reviewed. Constitutional: She is oriented to person, place, and time and well-developed, well-nourished, and in no distress.  HENT:  Head: Normocephalic and atraumatic.  Eyes: Conjunctivae are normal.  Neck: Neck supple.  Cardiovascular: Normal rate, regular rhythm, normal heart sounds and intact distal pulses.   Pulmonary/Chest: Effort normal and breath sounds normal. No respiratory distress. She has no wheezes. She has no rales. She exhibits no tenderness.   Musculoskeletal:       Right shoulder: She exhibits tenderness, pain and spasm. She exhibits no bony tenderness, no swelling, normal pulse and normal strength.       Right elbow: Normal.      Right wrist: Normal.       Cervical back: Normal.       Thoracic back: Normal.       Lumbar back: Normal.  Pain with resisted external and internal rotation of right shoulder. Range of motion elicits pain. No decreased strength noted on exam. Pain with extension of right shoulder. No pain on palpation of bony structures or bicipital groove. Pain and tenderness with muscle spasm noted on palpation of right trapezius muscle.  Neurological: She is alert and oriented to person, place, and time.  Skin: Skin is warm and dry. No rash noted.  Psychiatric: Affect normal.     Recent Results (from the past 2160 hour(s))  CBC     Status: Abnormal   Collection Time    02/17/13  9:30 AM      Result Value Range   WBC 7.6  4.0 - 10.5 K/uL   RBC 4.99  3.87 - 5.11 MIL/uL   Hemoglobin 15.4 (*) 12.0 - 15.0 g/dL   HCT 16.1  09.6 - 04.5 %   MCV 87.2  78.0 - 100.0 fL   MCH 30.9  26.0 - 34.0 pg   MCHC 35.4  30.0 - 36.0 g/dL   RDW 40.9  81.1 - 91.4 %   Platelets 247  150 - 400 K/uL  RENAL FUNCTION PANEL     Status: None   Collection Time    02/17/13  9:30 AM      Result Value Range   Sodium 140  135 - 145 mEq/L   Potassium 4.2  3.5 - 5.3 mEq/L   Chloride 107  96 - 112 mEq/L   CO2 26  19 - 32 mEq/L   Glucose, Bld 86  70 - 99 mg/dL   BUN 11  6 - 23 mg/dL   Creat 7.82  9.56 - 2.13 mg/dL   Albumin 4.3  3.5 - 5.2 g/dL   Calcium 9.5  8.4 - 08.6 mg/dL   Phosphorus 3.7  2.3 - 4.6 mg/dL  TSH     Status: None   Collection Time    02/17/13  9:30 AM      Result Value Range   TSH 0.975  0.350 - 4.500 uIU/mL  HEPATIC FUNCTION PANEL     Status: None   Collection Time    02/17/13  9:30  AM      Result Value Range   Total Bilirubin 0.5  0.3 - 1.2 mg/dL   Bilirubin, Direct 0.1  0.0 - 0.3 mg/dL   Indirect Bilirubin  0.4  0.0 - 0.9 mg/dL   Alkaline Phosphatase 76  39 - 117 U/L   AST 17  0 - 37 U/L   ALT 20  0 - 35 U/L   Total Protein 6.8  6.0 - 8.3 g/dL   Albumin 4.3  3.5 - 5.2 g/dL  LIPID PANEL     Status: Abnormal   Collection Time    02/17/13  9:30 AM      Result Value Range   Cholesterol 134  0 - 200 mg/dL   Comment: ATP III Classification:           < 200        mg/dL        Desirable          200 - 239     mg/dL        Borderline High          >= 240        mg/dL        High         Triglycerides 149  <150 mg/dL   HDL 36 (*) >40 mg/dL   Total CHOL/HDL Ratio 3.7     VLDL 30  0 - 40 mg/dL   LDL Cholesterol 68  0 - 99 mg/dL   Comment:       Total Cholesterol/HDL Ratio:CHD Risk                            Coronary Heart Disease Risk Table                                            Men       Women              1/2 Average Risk              3.4        3.3                  Average Risk              5.0        4.4               2X Average Risk              9.6        7.1               3X Average Risk             23.4       11.0     Use the calculated Patient Ratio above and the CHD Risk table      to determine the patient's CHD Risk.     ATP III Classification (LDL):           < 100        mg/dL         Optimal          100 - 129     mg/dL         Near or Above  Optimal          130 - 159     mg/dL         Borderline High          160 - 189     mg/dL         High           > 190        mg/dL         Very High          Assessment/Plan: Tendinopathy of rotator cuff Discussed need for imaging. Patient declines at present. Wishes to try prescription medication first. Rx Levoxine. Rx tramadol. Rest. Avoid overhead use of right shoulder and heavy lifting. Topical Aspercreme. Avoid lying on the affected side. Followup in one week. If symptoms not improving, will need to obtain imaging.

## 2013-03-08 DIAGNOSIS — M67919 Unspecified disorder of synovium and tendon, unspecified shoulder: Secondary | ICD-10-CM | POA: Insufficient documentation

## 2013-03-08 NOTE — Assessment & Plan Note (Signed)
Discussed need for imaging. Patient declines at present. Wishes to try prescription medication first. Rx Levoxine. Rx tramadol. Rest. Avoid overhead use of right shoulder and heavy lifting. Topical Aspercreme. Avoid lying on the affected side. Followup in one week. If symptoms not improving, will need to obtain imaging.

## 2013-03-19 DIAGNOSIS — Z8619 Personal history of other infectious and parasitic diseases: Secondary | ICD-10-CM

## 2013-03-19 HISTORY — DX: Personal history of other infectious and parasitic diseases: Z86.19

## 2013-06-02 ENCOUNTER — Encounter (HOSPITAL_BASED_OUTPATIENT_CLINIC_OR_DEPARTMENT_OTHER): Payer: Self-pay | Admitting: Emergency Medicine

## 2013-06-02 ENCOUNTER — Emergency Department (HOSPITAL_BASED_OUTPATIENT_CLINIC_OR_DEPARTMENT_OTHER): Payer: Federal, State, Local not specified - PPO

## 2013-06-02 ENCOUNTER — Telehealth: Payer: Self-pay | Admitting: Family Medicine

## 2013-06-02 ENCOUNTER — Emergency Department (HOSPITAL_BASED_OUTPATIENT_CLINIC_OR_DEPARTMENT_OTHER)
Admission: EM | Admit: 2013-06-02 | Discharge: 2013-06-02 | Disposition: A | Discharge status: Home / Self Care | Payer: Federal, State, Local not specified - PPO | Attending: Emergency Medicine | Admitting: Emergency Medicine

## 2013-06-02 DIAGNOSIS — F17200 Nicotine dependence, unspecified, uncomplicated: Secondary | ICD-10-CM | POA: Insufficient documentation

## 2013-06-02 DIAGNOSIS — Z79899 Other long term (current) drug therapy: Secondary | ICD-10-CM | POA: Insufficient documentation

## 2013-06-02 DIAGNOSIS — Z8639 Personal history of other endocrine, nutritional and metabolic disease: Secondary | ICD-10-CM | POA: Insufficient documentation

## 2013-06-02 DIAGNOSIS — Z8632 Personal history of gestational diabetes: Secondary | ICD-10-CM | POA: Insufficient documentation

## 2013-06-02 DIAGNOSIS — W108XXA Fall (on) (from) other stairs and steps, initial encounter: Secondary | ICD-10-CM | POA: Insufficient documentation

## 2013-06-02 DIAGNOSIS — S52133A Displaced fracture of neck of unspecified radius, initial encounter for closed fracture: Secondary | ICD-10-CM | POA: Insufficient documentation

## 2013-06-02 DIAGNOSIS — S52122A Displaced fracture of head of left radius, initial encounter for closed fracture: Secondary | ICD-10-CM

## 2013-06-02 DIAGNOSIS — Z87828 Personal history of other (healed) physical injury and trauma: Secondary | ICD-10-CM | POA: Insufficient documentation

## 2013-06-02 DIAGNOSIS — F319 Bipolar disorder, unspecified: Secondary | ICD-10-CM | POA: Insufficient documentation

## 2013-06-02 DIAGNOSIS — Z792 Long term (current) use of antibiotics: Secondary | ICD-10-CM | POA: Insufficient documentation

## 2013-06-02 DIAGNOSIS — Z862 Personal history of diseases of the blood and blood-forming organs and certain disorders involving the immune mechanism: Secondary | ICD-10-CM | POA: Insufficient documentation

## 2013-06-02 DIAGNOSIS — Y92009 Unspecified place in unspecified non-institutional (private) residence as the place of occurrence of the external cause: Secondary | ICD-10-CM | POA: Insufficient documentation

## 2013-06-02 DIAGNOSIS — Y939 Activity, unspecified: Secondary | ICD-10-CM | POA: Insufficient documentation

## 2013-06-02 DIAGNOSIS — Z8619 Personal history of other infectious and parasitic diseases: Secondary | ICD-10-CM | POA: Insufficient documentation

## 2013-06-02 MED ORDER — IBUPROFEN 800 MG PO TABS: 800.0000 mg | ORAL_TABLET | Freq: Three times a day (TID) | ORAL | Status: DC | PRN

## 2013-06-02 MED ORDER — IBUPROFEN 800 MG PO TABS
800.0000 mg | ORAL_TABLET | Freq: Once | ORAL | Status: AC
  Administered 2013-06-02: 800 mg via ORAL
  Filled 2013-06-02: qty 1

## 2013-06-02 NOTE — Telephone Encounter (Signed)
Relevant patient education mailed to patient.  

## 2013-06-02 NOTE — Telephone Encounter (Signed)
Larey SeatFell and went to the emergency room.  They said she has a broken radial head in her elbow.  They gave her a list of Orthopedic surgeons.  She wants to know who Dr b would recommend

## 2013-06-02 NOTE — Telephone Encounter (Signed)
Please advise 

## 2013-06-02 NOTE — ED Notes (Signed)
Fell down steps at home   Pain left elbow

## 2013-06-02 NOTE — ED Provider Notes (Signed)
CSN: [id number]     Arrival date & time 06/02/13  0029 History   First MD Initiated Contact with Patient 06/02/13 0155     Chief Complaint  Patient presents with  . Elbow Injury     (Consider location/radiation/quality/duration/timing/severity/associated sxs/prior Treatment) HPI This is a 23 year old female who slipped and fell down 4 stairs at home yesterday evening. She injured her left elbow. She is having pain in her left elbow radiating to her left forearm. The pain is moderate to severe and worse with supination or pronation. There is some paresthesias in the ulnar nerve distribution distally there is no functional defect. She denies other injury.  Past Medical History  Diagnosis Date  . Bipolar 1 disorder   . Chicken pox as a child  . Diabetes mellitus     gestational   . Back pain, lumbosacral   . Allergic state   . Abnormal cells of cervix   . Contraceptive management 01/06/2012  . Eczema 01/06/2012  . Acne 01/06/2012  . Mid back pain   . Contusion of leg 10/15/2012    right  . Dyslipidemia 02/18/2013   History reviewed. No pertinent past surgical history. Family History  Problem Relation Age of Onset  . Heart disease    . Diabetes Father 4    type 2  . Thyroid disease Father   . Hyperlipidemia      great grandparents  . Migraines Mother   . Kidney disease Mother    History  Substance Use Topics  . Smoking status: Current Every Day Smoker -- 1.00 packs/day    Types: Cigarettes  . Smokeless tobacco: Never Used     Comment: wants chantix  . Alcohol Use: No   OB History   Grav Para Term Preterm Abortions TAB SAB Ect Mult Living                 Review of Systems  All other systems reviewed and are negative.      Allergies  Review of patient's allergies indicates no known allergies.  Home Medications   Current Outpatient Rx  Name  Route  Sig  Dispense  Refill  . alprazolam (XANAX) 2 MG tablet   Oral   Take 1 tablet (2 mg total) by mouth 2  (two) times daily as needed for sleep or anxiety.   40 tablet   3   . clindamycin-benzoyl peroxide (BENZACLIN) gel   Topical   Apply topically 2 (two) times daily.   50 g   5   . clobetasol cream (TEMOVATE) 0.05 %   Topical   Apply topically daily as needed. eczema   60 g   1   . desogestrel-ethinyl estradiol (KARIVA) 0.15-0.02/0.01 MG (21/5) tablet   Oral   Take 1 tablet by mouth daily.   3 Package   1   . hydrOXYzine (VISTARIL) 25 MG capsule   Oral   Take 1 capsule (25 mg total) by mouth 3 (three) times daily as needed for anxiety.   60 capsule   1   . lamoTRIgine (LAMICTAL) 150 MG tablet      TAKE 1 TABLET (150 MG TOTAL) BY MOUTH DAILY.   180 tablet   1    BP 113/69  Pulse 92  Temp(Src) 98.5 F (36.9 C) (Oral)  Resp 18  Ht 5\' 5"  (1.651 m)  Wt 169 lb (76.658 kg)  BMI 28.12 kg/m2  SpO2 99%  LMP 05/19/2013  Physical Exam General: Well-developed, well-nourished female in  no acute distress; appearance consistent with age of record HENT: normocephalic; atraumatic Eyes: pupils equal, round and reactive to light; extraocular muscles intact Neck: supple; nontender Heart: regular rate and rhythm Lungs: clear to auscultation bilaterally Chest: Nontender Abdomen: soft; nondistended; nontender Back: No spinal tenderness Extremities: No deformity; pulses normal; tenderness, swelling and decreased range of motion of left elbow with focal tenderness over the radial head; left forearm distally neurovascularly intact except for altered sensation of the left fourth and fifth fingers in the ulnar distribution; intact function of the intrinsic muscles of the left hand Neurologic: Awake, alert and oriented; motor function intact in all extremities and symmetric; no facial droop Skin: Warm and dry Psychiatric: Normal mood and affect     ED Course  Procedures (including critical care time)   MDM   Nursing notes and vitals signs, including pulse oximetry,  reviewed.  Summary of this visit's results, reviewed by myself:  Labs:  No results found for this or any previous visit (from the past 24 hour(s)).  Imaging Studies: Dg Elbow Complete Left  06/02/2013   CLINICAL DATA:  Status post fall.  Left elbow pain.  EXAM: LEFT ELBOW - COMPLETE 3+ VIEW  COMPARISON:  None.  FINDINGS: There is a minimally impacted fracture of the radial neck eccentric on the lateral side. No other acute bony or joint abnormality is identified.  IMPRESSION: Minimally impacted radial neck fracture.   Electronically Signed   By: Drusilla Kannerhomas  Dalessio M.D.   On: 06/02/2013 01:35        Hanley SeamenJohn L Terrye Dombrosky, MD 06/02/13 [phone]

## 2013-06-02 NOTE — Telephone Encounter (Signed)
So there are lots of good orthos in area [facility]. If she has specific names she wants me to speak to I can.

## 2013-06-02 NOTE — ED Notes (Signed)
Fell down steps  C/o pain to left elbow  Denies loc or any other inj

## 2013-06-03 NOTE — Telephone Encounter (Signed)
Left detailed message on voicemail and to call and let us know how she would like to proceed.

## 2013-06-09 NOTE — Telephone Encounter (Signed)
Closing note until pt calls back. I would assume that pt has seen ortho by now

## 2013-07-02 ENCOUNTER — Ambulatory Visit (INDEPENDENT_AMBULATORY_CARE_PROVIDER_SITE_OTHER): Payer: Federal, State, Local not specified - PPO | Admitting: Physician Assistant

## 2013-07-02 ENCOUNTER — Encounter: Payer: Self-pay | Admitting: Physician Assistant

## 2013-07-02 VITALS — BP 104/68 | HR 96 | Temp 98.4°F | Resp 16 | Ht 65.0 in | Wt 168.0 lb

## 2013-07-02 DIAGNOSIS — G4700 Insomnia, unspecified: Secondary | ICD-10-CM | POA: Insufficient documentation

## 2013-07-02 DIAGNOSIS — F319 Bipolar disorder, unspecified: Secondary | ICD-10-CM

## 2013-07-02 DIAGNOSIS — S5290XA Unspecified fracture of unspecified forearm, initial encounter for closed fracture: Secondary | ICD-10-CM

## 2013-07-02 MED ORDER — LAMOTRIGINE 150 MG PO TABS: ORAL_TABLET | ORAL | Status: DC

## 2013-07-02 MED ORDER — OXYCODONE-ACETAMINOPHEN 5-325 MG PO TABS: 1.0000 | ORAL_TABLET | Freq: Three times a day (TID) | ORAL | Status: DC | PRN

## 2013-07-02 MED ORDER — ALPRAZOLAM 2 MG PO TABS: 2.0000 mg | ORAL_TABLET | Freq: Two times a day (BID) | ORAL | Status: DC | PRN

## 2013-07-02 NOTE — Progress Notes (Signed)
Patient presents to clinic today for medication refills.  Patient currently on Lamictal and Xanax for Bipolar I disorder.  Has been on medication regimen for a long time.  Denies mania.  Denies suicidal thought or ideation.  Patient also requesting refill of pain medication given by a specialist.  Patient recently broke her left radius.  Has follow-up with her orthopedist in the next few weeks.  Past Medical History  Diagnosis Date  . Bipolar 1 disorder   . Chicken pox as a child  . Diabetes mellitus     gestational   . Back pain, lumbosacral   . Allergic state   . Abnormal cells of cervix   . Contraceptive management 01/06/2012  . Eczema 01/06/2012  . Acne 01/06/2012  . Mid back pain   . Contusion of leg 10/15/2012    right  . Dyslipidemia 02/18/2013    Current Outpatient Prescriptions on File Prior to Visit  Medication Sig Dispense Refill  . clindamycin-benzoyl peroxide (BENZACLIN) gel Apply topically 2 (two) times daily.  50 g  5  . clobetasol cream (TEMOVATE) 0.05 % Apply topically daily as needed. eczema  60 g  1  . desogestrel-ethinyl estradiol (KARIVA) 0.15-0.02/0.01 MG (21/5) tablet Take 1 tablet by mouth daily.  3 Package  1  . hydrOXYzine (VISTARIL) 25 MG capsule Take 1 capsule (25 mg total) by mouth 3 (three) times daily as needed for anxiety.  60 capsule  1  . ibuprofen (ADVIL,MOTRIN) 800 MG tablet Take 1 tablet (800 mg total) by mouth every 8 (eight) hours as needed (for pain).  30 tablet  0  . [DISCONTINUED] naproxen (NAPROSYN) 375 MG tablet Take 1 tablet (375 mg total) by mouth 2 (two) times daily with a meal.  360 tablet  0   No current facility-administered medications on file prior to visit.    No Known Allergies  Family History  Problem Relation Age of Onset  . Heart disease    . Diabetes Father 6535    type 2  . Thyroid disease Father   . Hyperlipidemia      great grandparents  . Migraines Mother   . Kidney disease Mother     History   Social History   . Marital Status: Single    Spouse Name: N/A    Number of Children: 1  . Years of Education: N/A   Occupational History  . HR specialist     [employer]   Social History Main Topics  . Smoking status: Current Every Day Smoker -- 1.00 packs/day    Types: Cigarettes  . Smokeless tobacco: Never Used     Comment: wants chantix  . Alcohol Use: No  . Drug Use: No  . Sexual Activity: Yes    Partners: Male    Birth Control/ Protection: Injection     Comment: depo shot   Other Topics Concern  . None   Social History Narrative   Has a son. Living with her mother, stepfather, and brother and sister.     2 caffeinated drinks daily.   On Depo for birth control.    Review of Systems - See HPI.  All other ROS are negative.  BP 104/68  Pulse 96  Temp(Src) 98.4 F (36.9 C) (Oral)  Resp 16  Ht 5\' 5"  (1.651 m)  Wt 168 lb (76.204 kg)  BMI 27.96 kg/m2  SpO2 98%  LMP 06/28/2013  Physical Exam  Vitals reviewed. Constitutional: She is well-developed, well-nourished, and in no distress.  HENT:  Head: Normocephalic and atraumatic.  Right Ear: External ear normal.  Left Ear: External ear normal.  Nose: Nose normal.  Mouth/Throat: Oropharynx is clear and moist. No oropharyngeal exudate.  Eyes: Conjunctivae are normal. Pupils are equal, round, and reactive to light.  Cardiovascular: Normal rate, regular rhythm, normal heart sounds and intact distal pulses.   Pulmonary/Chest: Effort normal and breath sounds normal. No respiratory distress. She has no wheezes. She has no rales. She exhibits no tenderness.  Skin: Skin is warm and dry. No rash noted.  Psychiatric: Affect normal.   Assessment/Plan: Radial fracture Will refill pain medication until patient sees her specialist.  No further refills will be given.  Bipolar disorder, unspecified Refill Lamictal.  Further refills to come from PCP.  Insomnia Refill Xanax - 30-day supply.  Further refills to come from PCP.

## 2013-07-02 NOTE — Progress Notes (Signed)
Pre visit review using our clinic review tool, if applicable. No additional management support is needed unless otherwise documented below in the visit note/SLS  

## 2013-07-02 NOTE — Patient Instructions (Signed)
I have refilled medications.  Further refills will need to come from your PCP, Dr. Abner GreenspanBlyth.

## 2013-07-02 NOTE — Assessment & Plan Note (Signed)
Refill Lamictal.  Further refills to come from PCP.

## 2013-07-02 NOTE — Assessment & Plan Note (Addendum)
Will refill pain medication until patient sees her specialist.  No further refills will be given.

## 2013-07-02 NOTE — Assessment & Plan Note (Signed)
Refill Xanax - 30-day supply.  Further refills to come from PCP.

## 2013-07-03 ENCOUNTER — Telehealth: Payer: Self-pay | Admitting: Family Medicine

## 2013-07-03 NOTE — Telephone Encounter (Signed)
Relevant patient education assigned to patient using Emmi. ° °

## 2013-07-27 ENCOUNTER — Other Ambulatory Visit (HOSPITAL_COMMUNITY)
Admission: RE | Admit: 2013-07-27 | Discharge: 2013-07-27 | Disposition: A | Discharge status: Home / Self Care | Payer: Federal, State, Local not specified - PPO | Source: Ambulatory Visit | Attending: Family Medicine | Admitting: Family Medicine

## 2013-07-27 ENCOUNTER — Ambulatory Visit (INDEPENDENT_AMBULATORY_CARE_PROVIDER_SITE_OTHER): Payer: Federal, State, Local not specified - PPO | Admitting: Family Medicine

## 2013-07-27 ENCOUNTER — Encounter: Payer: Self-pay | Admitting: Family Medicine

## 2013-07-27 VITALS — BP 110/72 | HR 78 | Temp 98.1°F | Ht 65.0 in | Wt 169.0 lb

## 2013-07-27 DIAGNOSIS — F319 Bipolar disorder, unspecified: Secondary | ICD-10-CM

## 2013-07-27 DIAGNOSIS — N760 Acute vaginitis: Secondary | ICD-10-CM | POA: Insufficient documentation

## 2013-07-27 DIAGNOSIS — Z3169 Encounter for other general counseling and advice on procreation: Secondary | ICD-10-CM

## 2013-07-27 DIAGNOSIS — F17200 Nicotine dependence, unspecified, uncomplicated: Secondary | ICD-10-CM

## 2013-07-27 DIAGNOSIS — G4700 Insomnia, unspecified: Secondary | ICD-10-CM

## 2013-07-27 DIAGNOSIS — Z720 Tobacco use: Secondary | ICD-10-CM

## 2013-07-27 DIAGNOSIS — S5290XA Unspecified fracture of unspecified forearm, initial encounter for closed fracture: Secondary | ICD-10-CM

## 2013-07-27 DIAGNOSIS — Z309 Encounter for contraceptive management, unspecified: Secondary | ICD-10-CM

## 2013-07-27 MED ORDER — LAMOTRIGINE 150 MG PO TABS: ORAL_TABLET | ORAL | Status: DC

## 2013-07-27 MED ORDER — METRONIDAZOLE 500 MG PO TABS: 500.0000 mg | ORAL_TABLET | Freq: Two times a day (BID) | ORAL | Status: DC

## 2013-07-27 MED ORDER — ALPRAZOLAM 2 MG PO TABS: 2.0000 mg | ORAL_TABLET | Freq: Two times a day (BID) | ORAL | Status: DC | PRN

## 2013-07-27 NOTE — Progress Notes (Signed)
Pre visit review using our clinic review tool, if applicable. No additional management support is needed unless otherwise documented below in the visit note. 

## 2013-07-27 NOTE — Assessment & Plan Note (Signed)
Slowly improving, encouraged ice and topical treatments. F/u with ortho if pain persists

## 2013-07-27 NOTE — Progress Notes (Signed)
Patient ID: Jillian Reyes, female   DOB: 09/11/1990, 23 y.o.   MRN: [id number] Jillian SauceDebra A Reyes [id number] 09/05/1990 07/27/2013      Progress Note-Follow Up  Subjective  Chief Complaint  Chief Complaint  Patient presents with  . Follow-up    on medication    HPI  Patient is a 23 year old female in today for routine medical care. Patient is in today complaining of some increased vaginal discharge which is mildly fish smelling. No pain or sores noted. No back or abdominal pain. No new partners, no GI or GU c/o. No CP/palp/SOB. Left elbow continues to hurt daily s/p her fracture, slowly improving. Continues to smoke but is down to 4 cig daily  Past Medical History  Diagnosis Date  . Bipolar 1 disorder   . Chicken pox as a child  . Diabetes mellitus     gestational   . Back pain, lumbosacral   . Allergic state   . Abnormal cells of cervix   . Contraceptive management 01/06/2012  . Eczema 01/06/2012  . Acne 01/06/2012  . Mid back pain   . Contusion of leg 10/15/2012    right  . Dyslipidemia 02/18/2013  . Encounter for preconception consultation 07/27/2013    History reviewed. No pertinent past surgical history.  Family History  Problem Relation Age of Onset  . Heart disease    . Diabetes Father 6135    type 2  . Thyroid disease Father   . Hyperlipidemia      great grandparents  . Migraines Mother   . Kidney disease Mother     History   Social History  . Marital Status: Single    Spouse Name: N/A    Number of Children: 1  . Years of Education: N/A   Occupational History  . HR specialist     [employer]   Social History Main Topics  . Smoking status: Current Every Day Smoker -- 1.00 packs/day    Types: Cigarettes  . Smokeless tobacco: Never Used     Comment: wants chantix  . Alcohol Use: No  . Drug Use: No  . Sexual Activity: Yes    Partners: Male    Birth Control/ Protection: Injection     Comment: depo shot   Other Topics Concern  . Not on file   Social History  Narrative   Has a son. Living with her mother, stepfather, and brother and sister.     2 caffeinated drinks daily.   On Depo for birth control.     Current Outpatient Prescriptions on File Prior to Visit  Medication Sig Dispense Refill  . clindamycin-benzoyl peroxide (BENZACLIN) gel Apply topically 2 (two) times daily.  50 g  5  . clobetasol cream (TEMOVATE) 0.05 % Apply topically daily as needed. eczema  60 g  1  . [DISCONTINUED] naproxen (NAPROSYN) 375 MG tablet Take 1 tablet (375 mg total) by mouth 2 (two) times daily with a meal.  360 tablet  0   No current facility-administered medications on file prior to visit.    No Known Allergies  Review of Systems  Review of Systems  Constitutional: Negative for fever and malaise/fatigue.  HENT: Negative for congestion.   Eyes: Negative for discharge.  Respiratory: Negative for shortness of breath.   Cardiovascular: Negative for chest pain, palpitations and leg swelling.  Gastrointestinal: Negative for nausea, abdominal pain and diarrhea.  Genitourinary: Negative for dysuria.  Musculoskeletal: Negative for falls.  Skin: Negative for rash.  Neurological:  Negative for loss of consciousness and headaches.  Endo/Heme/Allergies: Negative for polydipsia.  Psychiatric/Behavioral: Negative for depression and suicidal ideas. The patient is not nervous/anxious and does not have insomnia.     Objective  BP 110/72  Pulse 78  Temp(Src) 98.1 F (36.7 C) (Oral)  Ht 5\' 5"  (1.651 m)  Wt 169 lb (76.658 kg)  BMI 28.12 kg/m2  SpO2 97%  LMP 06/28/2013  Physical Exam  Physical Exam  Constitutional: She is oriented to person, place, and time and well-developed, well-nourished, and in no distress. No distress.  HENT:  Head: Normocephalic and atraumatic.  Eyes: Conjunctivae are normal.  Neck: Neck supple. No thyromegaly present.  Cardiovascular: Normal rate, regular rhythm and normal heart sounds.   No murmur heard. Pulmonary/Chest: Effort  normal and breath sounds normal. She has no wheezes.  Abdominal: She exhibits no distension and no mass.  Musculoskeletal: She exhibits no edema.  Lymphadenopathy:    She has no cervical adenopathy.  Neurological: She is alert and oriented to person, place, and time.  Skin: Skin is warm and dry. No rash noted. She is not diaphoretic.  Psychiatric: Memory, affect and judgment normal.    Lab Results  Component Value Date   TSH 0.975 02/17/2013   Lab Results  Component Value Date   WBC 7.6 02/17/2013   HGB 15.4* 02/17/2013   HCT 43.5 02/17/2013   MCV 87.2 02/17/2013   PLT 247 02/17/2013   Lab Results  Component Value Date   CREATININE 0.82 02/17/2013   BUN 11 02/17/2013   NA 140 02/17/2013   K 4.2 02/17/2013   CL 107 02/17/2013   CO2 26 02/17/2013   Lab Results  Component Value Date   ALT 20 02/17/2013   AST 17 02/17/2013   ALKPHOS 76 02/17/2013   BILITOT 0.5 02/17/2013   Lab Results  Component Value Date   CHOL 134 02/17/2013   Lab Results  Component Value Date   HDL 36* 02/17/2013   Lab Results  Component Value Date   LDLCALC 68 02/17/2013   Lab Results  Component Value Date   TRIG 149 02/17/2013   Lab Results  Component Value Date   CHOLHDL 3.7 02/17/2013     Assessment & Plan  Vaginitis and vulvovaginitis Check cytology, given rx for Flagyl to take if worsens or testing is positiveand restart probiotics  Tobacco abuse Down to 4 cigs. Encouraged complete cessation. Discussed need to quit as relates to risk of numerous cancers, cardiac and pulmonary disease as well as neurologic complications. Counseled for greater than 3 minutes  Contraceptive management Has stopped all hormones and will use barrier methods for now.  Encounter for preconception consultation Is not attempting pregnancy yet but does agree to find a psychiatrist to help wean her off a little. Understands she cannot take I. With pregnancy. Does also encouraged to have a preconception consultation with  OB/GYN as well.  Radial fracture Slowly improving, encouraged ice and topical treatments. F/u with ortho if pain persists

## 2013-07-27 NOTE — Assessment & Plan Note (Signed)
Is not attempting pregnancy yet but does agree to find a psychiatrist to help wean her off a little. Understands she cannot take I. With pregnancy. Does also encouraged to have a preconception consultation with OB/GYN as well.

## 2013-07-27 NOTE — Patient Instructions (Signed)
Vaginitis Vaginitis is an inflammation of the vagina. It is most often caused by a change in the normal balance of the bacteria and yeast that live in the vagina. This change in balance causes an overgrowth of certain bacteria or yeast, which causes the inflammation. There are different types of vaginitis, but the most common types are:  Bacterial vaginosis.  Yeast infection (candidiasis).  Trichomoniasis vaginitis. This is a sexually transmitted infection (STI).  Viral vaginitis.  Atropic vaginitis.  Allergic vaginitis. CAUSES  The cause depends on the type of vaginitis. Vaginitis can be caused by:  Bacteria (bacterial vaginosis).  Yeast (yeast infection).  A parasite (trichomoniasis vaginitis)  A virus (viral vaginitis).  Low hormone levels (atrophic vaginitis). Low hormone levels can occur during pregnancy, breastfeeding, or after menopause.  Irritants, such as bubble baths, scented tampons, and feminine sprays (allergic vaginitis). Other factors can change the normal balance of the yeast and bacteria that live in the vagina. These include:  Antibiotic medicines.  Poor hygiene.  Diaphragms, vaginal sponges, spermicides, birth control pills, and intrauterine devices (IUD).  Sexual intercourse.  Infection.  Uncontrolled diabetes.  A weakened immune system. SYMPTOMS  Symptoms can vary depending on the cause of the vaginitis. Common symptoms include:  Abnormal vaginal discharge.  The discharge is white, gray, or yellow with bacterial vaginosis.  The discharge is thick, white, and cheesy with a yeast infection.  The discharge is frothy and yellow or greenish with trichomoniasis.  A bad vaginal odor.  The odor is fishy with bacterial vaginosis.  Vaginal itching, pain, or swelling.  Painful intercourse.  Pain or burning when urinating. Sometimes, there are no symptoms. TREATMENT  Treatment will vary depending on the type of infection.   Bacterial  vaginosis and trichomoniasis are often treated with antibiotic creams or pills.  Yeast infections are often treated with antifungal medicines, such as vaginal creams or suppositories.  Viral vaginitis has no cure, but symptoms can be treated with medicines that relieve discomfort. Your sexual partner should be treated as well.  Atrophic vaginitis may be treated with an estrogen cream, pill, suppository, or vaginal ring. If vaginal dryness occurs, lubricants and moisturizing creams may help. You may be told to avoid scented soaps, sprays, or douches.  Allergic vaginitis treatment involves quitting the use of the product that is causing the problem. Vaginal creams can be used to treat the symptoms. HOME CARE INSTRUCTIONS   Take all medicines as directed by your caregiver.  Keep your genital area clean and dry. Avoid soap and only rinse the area with water.  Avoid douching. It can remove the healthy bacteria in the vagina.  Do not use tampons or have sexual intercourse until your vaginitis has been treated. Use sanitary pads while you have vaginitis.  Wipe from front to back. This avoids the spread of bacteria from the rectum to the vagina.  Let air reach your genital area.  Wear cotton underwear to decrease moisture buildup.  Avoid wearing underwear while you sleep until your vaginitis is gone.  Avoid tight pants and underwear or nylons without a cotton panel.  Take off wet clothing (especially bathing suits) as soon as possible.  Use mild, non-scented products. Avoid using irritants, such as:  Scented feminine sprays.  Fabric softeners.  Scented detergents.  Scented tampons.  Scented soaps or bubble baths.  Practice safe sex and use condoms. Condoms may prevent the spread of trichomoniasis and viral vaginitis. SEEK MEDICAL CARE IF:   You have abdominal pain.  You   have a fever or persistent symptoms for more than 2 3 days.  You have a fever and your symptoms suddenly  get worse. Document Released: 12/31/2006 Document Revised: 11/28/2011 Document Reviewed: 08/16/2011 [employer] Patient Information 2014 [employer].  

## 2013-07-27 NOTE — Assessment & Plan Note (Signed)
Down to 4 cigs. Encouraged complete cessation. Discussed need to quit as relates to risk of numerous cancers, cardiac and pulmonary disease as well as neurologic complications. Counseled for greater than 3 minutes

## 2013-07-27 NOTE — Assessment & Plan Note (Signed)
Has stopped all hormones and will use barrier methods for now.

## 2013-07-27 NOTE — Assessment & Plan Note (Signed)
Check cytology, given rx for Flagyl to take if worsens or testing is positiveand restart probiotics

## 2013-08-22 ENCOUNTER — Other Ambulatory Visit: Payer: Self-pay | Admitting: Family Medicine

## 2013-08-24 NOTE — Telephone Encounter (Signed)
rx faxed

## 2013-08-24 NOTE — Telephone Encounter (Signed)
Please advise refill? Last RX was done on 07-27-13 quantity 40 with 0 refills  If ok fax to [phone]

## 2013-09-22 ENCOUNTER — Other Ambulatory Visit: Payer: Self-pay | Admitting: Family Medicine

## 2013-09-24 ENCOUNTER — Ambulatory Visit (INDEPENDENT_AMBULATORY_CARE_PROVIDER_SITE_OTHER): Payer: Federal, State, Local not specified - PPO | Admitting: Family Medicine

## 2013-09-24 ENCOUNTER — Other Ambulatory Visit (HOSPITAL_COMMUNITY)
Admission: RE | Admit: 2013-09-24 | Discharge: 2013-09-24 | Disposition: A | Discharge status: Home / Self Care | Payer: Federal, State, Local not specified - PPO | Source: Ambulatory Visit | Attending: Family Medicine | Admitting: Family Medicine

## 2013-09-24 ENCOUNTER — Encounter: Payer: Self-pay | Admitting: Family Medicine

## 2013-09-24 VITALS — BP 100/62 | HR 88 | Temp 98.2°F | Ht 64.5 in | Wt 161.0 lb

## 2013-09-24 DIAGNOSIS — Z113 Encounter for screening for infections with a predominantly sexual mode of transmission: Secondary | ICD-10-CM | POA: Insufficient documentation

## 2013-09-24 DIAGNOSIS — N760 Acute vaginitis: Secondary | ICD-10-CM

## 2013-09-24 DIAGNOSIS — L708 Other acne: Secondary | ICD-10-CM

## 2013-09-24 DIAGNOSIS — N879 Dysplasia of cervix uteri, unspecified: Secondary | ICD-10-CM

## 2013-09-24 DIAGNOSIS — N912 Amenorrhea, unspecified: Secondary | ICD-10-CM

## 2013-09-24 DIAGNOSIS — Z01419 Encounter for gynecological examination (general) (routine) without abnormal findings: Secondary | ICD-10-CM | POA: Insufficient documentation

## 2013-09-24 DIAGNOSIS — L700 Acne vulgaris: Secondary | ICD-10-CM

## 2013-09-24 DIAGNOSIS — F411 Generalized anxiety disorder: Secondary | ICD-10-CM

## 2013-09-24 DIAGNOSIS — R87619 Unspecified abnormal cytological findings in specimens from cervix uteri: Secondary | ICD-10-CM

## 2013-09-24 DIAGNOSIS — F430 Acute stress reaction: Secondary | ICD-10-CM

## 2013-09-24 DIAGNOSIS — R4589 Other symptoms and signs involving emotional state: Secondary | ICD-10-CM

## 2013-09-24 DIAGNOSIS — Z124 Encounter for screening for malignant neoplasm of cervix: Secondary | ICD-10-CM

## 2013-09-24 LAB — CBC
HCT: 44.3 % (ref 36.0–46.0)
Hemoglobin: 15.5 g/dL — ABNORMAL HIGH (ref 12.0–15.0)
MCH: 31.3 pg (ref 26.0–34.0)
MCHC: 35 g/dL (ref 30.0–36.0)
MCV: 89.5 fL (ref 78.0–100.0)
Platelets: 235 10*3/uL (ref 150–400)
RBC: 4.95 MIL/uL (ref 3.87–5.11)
RDW: 13.3 % (ref 11.5–15.5)
WBC: 8.7 10*3/uL (ref 4.0–10.5)

## 2013-09-24 NOTE — Progress Notes (Signed)
Pre visit review using our clinic review tool, if applicable. No additional management support is needed unless otherwise documented below in the visit note. 

## 2013-09-24 NOTE — Patient Instructions (Signed)

## 2013-09-25 ENCOUNTER — Encounter: Payer: Self-pay | Admitting: Family Medicine

## 2013-09-25 LAB — RPR

## 2013-09-25 LAB — CYTOLOGY - PAP

## 2013-09-25 LAB — HIV ANTIBODY (ROUTINE TESTING W REFLEX): HIV 1&2 Ab, 4th Generation: NONREACTIVE

## 2013-09-25 LAB — PREGNANCY, URINE: PREG TEST UR: NEGATIVE

## 2013-09-25 NOTE — Assessment & Plan Note (Signed)
Doing well with recent increased stressors

## 2013-09-25 NOTE — Assessment & Plan Note (Signed)
No recent exacerbations.  

## 2013-09-25 NOTE — Assessment & Plan Note (Signed)
Just discovered her partner 4 years was cheating on her without protection. She is having some increased vaginal discharge but she notes seems like her bacterial overgrowth from the past. It is quite and mildly malodorous. Will proceed with STD testing. Based on exam would wait to treat til labs available

## 2013-09-25 NOTE — Assessment & Plan Note (Signed)
H/o but no pap for several years. Will proceed with pap today. Vaginal discharge noted

## 2013-09-25 NOTE — Progress Notes (Signed)
Patient ID: Jillian Reyes, female   DOB: January 20, 1991, 23 y.o.   MRN: [id number] Jillian Reyes [id number] [birth year] 09/25/2013      Progress Note-Follow Up  Subjective  Chief Complaint  Chief Complaint  Patient presents with  . std testing    HPI  Patient is a 22ear old female in today for evaluation of vaginitis. She has recently become with her significant other for years has been cheating on her for protection. She has been sexually active with him since then. She's not had a cycle in about 6 weeks but that is not unusual for her and she has done a pregnancy test which was negative. She does have increased vaginal discharge but says it is similar to when she had bacterial vaginosis in the past. No fevers or chills. No abdominal pain or urinary symptoms. No back pain. Her acne is well controlled and she offers no acute new complaints. He is tolerating the increased stress of the situation adequately. Denies CP/palp/SOB/HA/congestion/fevers/GI or GU c/o. Taking meds as prescribed  Past Medical History  Diagnosis Date  . Bipolar 1 disorder   . Chicken pox as a child  . Diabetes mellitus     gestational   . Back pain, lumbosacral   . Allergic state   . Abnormal cells of cervix   . Contraceptive management 01/06/2012  . Eczema 01/06/2012  . Acne 01/06/2012  . Mid back pain   . Contusion of leg 10/15/2012    right  . Dyslipidemia 02/18/2013  . Encounter for preconception consultation 07/27/2013    History reviewed. No pertinent past surgical history.  Family History  Problem Relation Age of Onset  . Heart disease    . Diabetes Father 79    type 2  . Thyroid disease Father   . Hyperlipidemia      great grandparents  . Migraines Mother   . Kidney disease Mother     History   Social History  . Marital Status: Single    Spouse Name: N/A    Number of Children: 1  . Years of Education: N/A   Occupational History  . HR specialist     [employer]   Social History Main Topics  .  Smoking status: Current Every Day Smoker -- 1.00 packs/day    Types: Cigarettes  . Smokeless tobacco: Never Used     Comment: wants chantix  . Alcohol Use: No  . Drug Use: No  . Sexual Activity: Yes    Partners: Male    Birth Control/ Protection: Injection     Comment: depo shot   Other Topics Concern  . Not on file   Social History Narrative   Has a son. Living with her mother, stepfather, and brother and sister.     2 caffeinated drinks daily.   On Depo for birth control.     Current Outpatient Prescriptions on File Prior to Visit  Medication Sig Dispense Refill  . alprazolam (XANAX) 2 MG tablet TAKE 1 TABLET TWICE A DAY AS NEEDED FOR ANXIETY AND SLEEP  40 tablet  0  . clindamycin-benzoyl peroxide (BENZACLIN) gel Apply topically 2 (two) times daily.  50 g  5  . clobetasol cream (TEMOVATE) 0.05 % Apply topically daily as needed. eczema  60 g  1  . lamoTRIgine (LAMICTAL) 150 MG tablet TAKE 1 TABLET (150 MG TOTAL) BY MOUTH DAILY.  30 tablet  1  . [DISCONTINUED] naproxen (NAPROSYN) 375 MG tablet Take 1 tablet (375 mg total) by  mouth 2 (two) times daily with a meal.  360 tablet  0   No current facility-administered medications on file prior to visit.    No Known Allergies  Review of Systems  Review of Systems  Constitutional: Negative for fever and malaise/fatigue.  HENT: Negative for congestion.   Eyes: Negative for discharge.  Respiratory: Negative for shortness of breath.   Cardiovascular: Negative for chest pain, palpitations and leg swelling.  Gastrointestinal: Negative for nausea, abdominal pain and diarrhea.  Genitourinary: Negative for dysuria.  Musculoskeletal: Negative for falls and joint pain.  Skin: Negative for rash.  Neurological: Negative for loss of consciousness and headaches.  Endo/Heme/Allergies: Negative for polydipsia.  Psychiatric/Behavioral: Negative for depression and suicidal ideas. The patient is not nervous/anxious and does not have insomnia.      Objective  BP 100/62  Pulse 88  Temp(Src) 98.2 F (36.8 C) (Oral)  Ht 5' 4.5" (1.638 m)  Wt 161 lb (73.029 kg)  BMI 27.22 kg/m2  SpO2 96%  LMP 07/25/2013  Physical Exam  Physical Exam  Constitutional: She is oriented to person, place, and time and well-developed, well-nourished, and in no distress. No distress.  HENT:  Head: Normocephalic and atraumatic.  Eyes: Conjunctivae are normal.  Neck: Neck supple. No thyromegaly present.  Cardiovascular: Normal rate, regular rhythm and normal heart sounds.   No murmur heard. Pulmonary/Chest: Effort normal and breath sounds normal. She has no wheezes.  Abdominal: She exhibits no distension and no mass.  Genitourinary: Uterus normal, cervix normal, right adnexa normal and left adnexa normal. Vaginal discharge found.  Whitish gray vaginal discharge  Musculoskeletal: She exhibits no edema.  Lymphadenopathy:    She has no cervical adenopathy.  Neurological: She is alert and oriented to person, place, and time.  Skin: Skin is warm and dry. No rash noted. She is not diaphoretic.  Psychiatric: Memory, affect and judgment normal.    Lab Results  Component Value Date   TSH 0.975 02/17/2013   Lab Results  Component Value Date   WBC 8.7 09/24/2013   HGB 15.5* 09/24/2013   HCT 44.3 09/24/2013   MCV 89.5 09/24/2013   PLT 235 09/24/2013   Lab Results  Component Value Date   CREATININE 0.82 02/17/2013   BUN 11 02/17/2013   NA 140 02/17/2013   K 4.2 02/17/2013   CL 107 02/17/2013   CO2 26 02/17/2013   Lab Results  Component Value Date   ALT 20 02/17/2013   AST 17 02/17/2013   ALKPHOS 76 02/17/2013   BILITOT 0.5 02/17/2013   Lab Results  Component Value Date   CHOL 134 02/17/2013   Lab Results  Component Value Date   HDL 36* 02/17/2013   Lab Results  Component Value Date   LDLCALC 68 02/17/2013   Lab Results  Component Value Date   TRIG 149 02/17/2013   Lab Results  Component Value Date   CHOLHDL 3.7 02/17/2013     Assessment &  Plan  Vaginitis and vulvovaginitis Just discovered her partner 4 years was cheating on her without protection. She is having some increased vaginal discharge but she notes seems like her bacterial overgrowth from the past. It is quite and mildly malodorous. Will proceed with STD testing. Based on exam would wait to treat til labs available  Abnormal cells of cervix H/o but no pap for several years. Will proceed with pap today. Vaginal discharge noted  Anxiety as acute reaction to exceptional stress Doing well with recent increased stressors  Acne  No recent exacerbations

## 2013-09-28 ENCOUNTER — Telehealth: Payer: Self-pay | Admitting: Physician Assistant

## 2013-09-28 DIAGNOSIS — N760 Acute vaginitis: Principal | ICD-10-CM

## 2013-09-28 DIAGNOSIS — B9689 Other specified bacterial agents as the cause of diseases classified elsewhere: Secondary | ICD-10-CM

## 2013-09-28 MED ORDER — METRONIDAZOLE 500 MG PO TABS: 500.0000 mg | ORAL_TABLET | Freq: Two times a day (BID) | ORAL | Status: DC

## 2013-09-28 NOTE — Telephone Encounter (Signed)
Left a detailed message with all this information and that we would give pt a call when Dr Abner GreenspanBlyth returns

## 2013-09-28 NOTE — Telephone Encounter (Signed)
I have never sent in Cleocin for once monthly usage.  That would not help prevent her from getting BV. It can be used in the treatment for acute BV instead of the Metronidazole (Flagyl), but both together are usually not needed.  I am unsure if Dr. Abner GreenspanBlyth might have intended for her to use cream if symptoms recurred, but typically that is used daily for 7 days.  I will defer this prescription to Dr. Abner GreenspanBlyth.  I recommend to prevent this from happening again that she take a daily probiotic, avoid use of douches, and to use a soft cloth with some distilled white vinegar on it to wipe the external genital structures to keep the vaginal pH where it needs to be.

## 2013-09-28 NOTE — Telephone Encounter (Signed)
Rx sent in for Flagyl 500 mg BID x 7 days.  Patient is to take as directed.  No alcohol while on medication and for 24 hours after taking last pill.  Call or return to clinic if symptoms are not improving.

## 2013-09-28 NOTE — Telephone Encounter (Signed)
Patient informed and states that Dr Abner GreenspanBlyth told her that the Cleomycin gel would be called in to use once a month? Please advise? I informed pt we would only call her back if this wasn't sent to pharmacy

## 2013-09-28 NOTE — Telephone Encounter (Signed)
Message copied by Marcelline MatesMARTIN, Audreena Sachdeva on Mon Sep 28, 2013 11:59 AM ------      Message from: Court JoyFREEMAN, CHRISTY L      Created: Mon Sep 28, 2013 11:53 AM       I left a message for pt to call back about the BV. According to last lab results MD sent in Flagyl  And if not any better to send in Cleocin vaginal gel. Pt never returned call then nor did we send in Cleocin. So I would say to send in the Flagyl because that is what she took in the past.             Notes Recorded by Bradd CanaryStacey A Blyth, MD on 07/31/2013 at 12:59 PM      Patient with BV if she is responding to Flagyl that should be enough, if she is not then Send in Cleocin vaginal gel pv qhs x 7 days ------

## 2013-09-28 NOTE — Telephone Encounter (Signed)
Sorry wrong spelling below Cleocin vaginal gel

## 2013-09-30 ENCOUNTER — Encounter: Payer: Self-pay | Admitting: Physician Assistant

## 2013-09-30 ENCOUNTER — Telehealth: Payer: Self-pay

## 2013-09-30 NOTE — Telephone Encounter (Signed)
Patient has diagnosis of Diabetes in system with comment that it was "gestational".  I changed the diagnosis to Hx of Gestational Diabetes.  This should remove her from the bundle process.

## 2013-09-30 NOTE — Telephone Encounter (Signed)
FYI  Diabetic bundle states pt needs A1C checked this was just for gestational diabetes- will send to md for her to look at when she gets home (but I don't believe this is something that needs to be done)

## 2013-11-17 ENCOUNTER — Encounter: Payer: Self-pay | Admitting: Physician Assistant

## 2013-11-17 ENCOUNTER — Ambulatory Visit (INDEPENDENT_AMBULATORY_CARE_PROVIDER_SITE_OTHER): Payer: Federal, State, Local not specified - PPO | Admitting: Physician Assistant

## 2013-11-17 VITALS — BP 105/70 | HR 102 | Temp 98.2°F | Wt 160.0 lb

## 2013-11-17 DIAGNOSIS — G43009 Migraine without aura, not intractable, without status migrainosus: Secondary | ICD-10-CM

## 2013-11-17 MED ORDER — KETOROLAC TROMETHAMINE 60 MG/2ML IM SOLN
60.0000 mg | Freq: Once | INTRAMUSCULAR | Status: AC
  Administered 2013-11-17: 60 mg via INTRAMUSCULAR

## 2013-11-17 MED ORDER — SUMATRIPTAN SUCCINATE 50 MG PO TABS: 50.0000 mg | ORAL_TABLET | Freq: Once | ORAL | Status: DC

## 2013-11-17 NOTE — Progress Notes (Signed)
Pre visit review using our clinic review tool, if applicable. No additional management support is needed unless otherwise documented below in the visit note. 

## 2013-11-17 NOTE — Progress Notes (Signed)
Patient presents to clinic today c/o intermittent headaches over the past 3 days associated with nausea, photophobia and phonophobia.  Endorses increased stressors recently. Denies change to diet.  Most recent headache has been present since this AM.  Is moderate-severe in nature.  Patient denies vision changes or AMS.  Has not had anything for pain this morning.  Past Medical History  Diagnosis Date  . Bipolar 1 disorder   . Chicken pox as a child  . Diabetes mellitus     gestational   . Back pain, lumbosacral   . Allergic state   . Abnormal cells of cervix   . Contraceptive management 01/06/2012  . Eczema 01/06/2012  . Acne 01/06/2012  . Mid back pain   . Contusion of leg 10/15/2012    right  . Dyslipidemia 02/18/2013  . Encounter for preconception consultation 07/27/2013    Current Outpatient Prescriptions on File Prior to Visit  Medication Sig Dispense Refill  . alprazolam (XANAX) 2 MG tablet TAKE 1 TABLET TWICE A DAY AS NEEDED FOR ANXIETY AND SLEEP  40 tablet  0  . clindamycin-benzoyl peroxide (BENZACLIN) gel Apply topically 2 (two) times daily.  50 g  5  . clobetasol cream (TEMOVATE) 0.05 % Apply topically daily as needed. eczema  60 g  1  . lamoTRIgine (LAMICTAL) 150 MG tablet TAKE 1 TABLET (150 MG TOTAL) BY MOUTH DAILY.  30 tablet  1  . [DISCONTINUED] naproxen (NAPROSYN) 375 MG tablet Take 1 tablet (375 mg total) by mouth 2 (two) times daily with a meal.  360 tablet  0   No current facility-administered medications on file prior to visit.    No Known Allergies  Family History  Problem Relation Age of Onset  . Heart disease    . Diabetes Father 75    type 2  . Thyroid disease Father   . Hyperlipidemia      great grandparents  . Migraines Mother   . Kidney disease Mother     History   Social History  . Marital Status: Single    Spouse Name: N/A    Number of Children: 1  . Years of Education: N/A   Occupational History  . HR specialist     [employer]   Social  History Main Topics  . Smoking status: Current Every Day Smoker -- 1.00 packs/day    Types: Cigarettes  . Smokeless tobacco: Never Used     Comment: wants chantix  . Alcohol Use: No  . Drug Use: No  . Sexual Activity: Yes    Partners: Male    Birth Control/ Protection: Injection     Comment: depo shot   Other Topics Concern  . None   Social History Narrative   Has a son. Living with her mother, stepfather, and brother and sister.     2 caffeinated drinks daily.   On Depo for birth control.     Review of Systems - See HPI.  All other ROS are negative.  BP 105/70  Pulse 102  Temp(Src) 98.2 F (36.8 C)  Wt 160 lb (72.576 kg)  SpO2 96%  Physical Exam  Vitals reviewed. Constitutional: She is oriented to person, place, and time and well-developed, well-nourished, and in no distress.  HENT:  Head: Normocephalic and atraumatic.  Right Ear: External ear normal.  Left Ear: External ear normal.  Nose: Nose normal.  Mouth/Throat: Oropharynx is clear and moist. No oropharyngeal exudate.  TM within normal limits bilaterally.  No TTP of sinuses  noted on exam.  Eyes: Conjunctivae are normal. Pupils are equal, round, and reactive to light.  Neck: Neck supple. No thyromegaly present.  Cardiovascular: Normal rate, regular rhythm, normal heart sounds and intact distal pulses.   Pulmonary/Chest: Effort normal and breath sounds normal. No respiratory distress. She has no wheezes. She has no rales. She exhibits no tenderness.  Lymphadenopathy:    She has no cervical adenopathy.  Neurological: She is alert and oriented to person, place, and time.  Skin: Skin is warm and dry. No rash noted.  Psychiatric: Affect normal.    Recent Results (from the past 2160 hour(s))  CYTOLOGY - PAP     Status: None   Collection Time    09/24/13 12:00 AM      Result Value Ref Range   CYTOLOGY - PAP PAP RESULT    CBC     Status: Abnormal   Collection Time    09/24/13  9:00 AM      Result Value Ref  Range   WBC 8.7  4.0 - 10.5 K/uL   RBC 4.95  3.87 - 5.11 MIL/uL   Hemoglobin 15.5 (*) 12.0 - 15.0 g/dL   HCT 86.5  78.4 - 69.6 %   MCV 89.5  78.0 - 100.0 fL   MCH 31.3  26.0 - 34.0 pg   MCHC 35.0  30.0 - 36.0 g/dL   RDW 29.5  28.4 - 13.2 %   Platelets 235  150 - 400 K/uL  HIV ANTIBODY (ROUTINE TESTING)     Status: None   Collection Time    09/24/13  9:00 AM      Result Value Ref Range   HIV 1&2 Ab, 4th Generation NONREACTIVE  NONREACTIVE   Comment:       A NONREACTIVE HIV Ag/Ab result does not exclude HIV infection since     the time frame for seroconversion is variable. If acute HIV infection     is suspected, a HIV-1 RNA Qualitative TMA test is recommended.           HIV-1/2 Antibody Diff         Not indicated.     HIV-1 RNA, Qual TMA           Not indicated.           PLEASE NOTE: This information has been disclosed to you from records     whose confidentiality may be protected by state law. If your state     requires such protection, then the state law prohibits you from making     any further disclosure of the information without the specific written     consent of the person to whom it pertains, or as otherwise permitted     by law. A general authorization for the release of medical or other     information is NOT sufficient for this purpose.           The performance of this assay has not been clinically validated in     patients less than 51 years old.  RPR     Status: None   Collection Time    09/24/13  9:00 AM      Result Value Ref Range   RPR NON REAC  NON REAC  PREGNANCY, URINE     Status: None   Collection Time    09/24/13  9:00 AM      Result Value Ref Range   Preg Test, Ur NEG  Comment: The sensitivity for this methodology is >24 mIU/mL.          Assessment/Plan: Migraine without aura and without status migrainosus, not intractable 60 mg IM Toradol given in office with improvement in symptoms.  Rx Imitrex with dosing instructions given to patient.   Increase fluid intake.  Do not skip meals.  Add a little caffeine to diet over the next couple of days.  Return precautions discussed with patient.

## 2013-11-17 NOTE — Assessment & Plan Note (Signed)
60 mg IM Toradol given in office with improvement in symptoms.  Rx Imitrex with dosing instructions given to patient.  Increase fluid intake.  Do not skip meals.  Add a little caffeine to diet over the next couple of days.  Return precautions discussed with patient.

## 2013-11-17 NOTE — Patient Instructions (Signed)
Please try to rest.  Have some caffeine today as that will help symptoms.  Avoid skipping meals.  Stay well hydrated.  The Toradol given should help the migraine. I have given you a prescription for Imitrex to take if migraine recurs.  You can take 1 tablet by mouth.  May take 1 more tablet 2 hours later if needed.  No more than 2 tablets in a day.  Migraine Headache A migraine headache is an intense, throbbing pain on one or both sides of your head. A migraine can last for 30 minutes to several hours. CAUSES  The exact cause of a migraine headache is not always known. However, a migraine may be caused when nerves in the brain become irritated and release chemicals that cause inflammation. This causes pain. Certain things may also trigger migraines, such as:  Alcohol.  Smoking.  Stress.  Menstruation.  Aged cheeses.  Foods or drinks that contain nitrates, glutamate, aspartame, or tyramine.  Lack of sleep.  Chocolate.  Caffeine.  Hunger.  Physical exertion.  Fatigue.  Medicines used to treat chest pain (nitroglycerine), birth control pills, estrogen, and some blood pressure medicines. SIGNS AND SYMPTOMS  Pain on one or both sides of your head.  Pulsating or throbbing pain.  Severe pain that prevents daily activities.  Pain that is aggravated by any physical activity.  Nausea, vomiting, or both.  Dizziness.  Pain with exposure to bright lights, loud noises, or activity.  General sensitivity to bright lights, loud noises, or smells. Before you get a migraine, you may get warning signs that a migraine is coming (aura). An aura may include:  Seeing flashing lights.  Seeing bright spots, halos, or zigzag lines.  Having tunnel vision or blurred vision.  Having feelings of numbness or tingling.  Having trouble talking.  Having muscle weakness. DIAGNOSIS  A migraine headache is often diagnosed based on:  Symptoms.  Physical exam.  A CT scan or MRI of your  head. These imaging tests cannot diagnose migraines, but they can help rule out other causes of headaches. TREATMENT Medicines may be given for pain and nausea. Medicines can also be given to help prevent recurrent migraines.  HOME CARE INSTRUCTIONS  Only take over-the-counter or prescription medicines for pain or discomfort as directed by your health care provider. The use of long-term narcotics is not recommended.  Lie down in a dark, quiet room when you have a migraine.  Keep a journal to find out what may trigger your migraine headaches. For example, write down:  What you eat and drink.  How much sleep you get.  Any change to your diet or medicines.  Limit alcohol consumption.  Quit smoking if you smoke.  Get 7-9 hours of sleep, or as recommended by your health care provider.  Limit stress.  Keep lights dim if bright lights bother you and make your migraines worse. SEEK IMMEDIATE MEDICAL CARE IF:   Your migraine becomes severe.  You have a fever.  You have a stiff neck.  You have vision loss.  You have muscular weakness or loss of muscle control.  You start losing your balance or have trouble walking.  You feel faint or pass out.  You have severe symptoms that are different from your first symptoms. MAKE SURE YOU:   Understand these instructions.  Will watch your condition.  Will get help right away if you are not doing well or get worse. Document Released: 03/05/2005 Document Revised: 07/20/2013 Document Reviewed: 11/10/2012 ExitCare Patient Information  2015 [employer]. This information is not intended to replace advice given to you by your health care provider. Make sure you discuss any questions you have with your health care provider.  

## 2013-11-24 ENCOUNTER — Other Ambulatory Visit: Payer: Self-pay | Admitting: Physician Assistant

## 2013-11-24 ENCOUNTER — Telehealth: Payer: Self-pay | Admitting: Family Medicine

## 2013-11-24 ENCOUNTER — Other Ambulatory Visit: Payer: Self-pay | Admitting: Family Medicine

## 2013-11-24 MED ORDER — ALPRAZOLAM 2 MG PO TABS: 2.0000 mg | ORAL_TABLET | Freq: Two times a day (BID) | ORAL | Status: DC | PRN

## 2013-11-24 NOTE — Telephone Encounter (Signed)
Caller name: Tanisa Relation to pt: self Call back number: [phone] Pharmacy: [facility]  Reason for call:   Patient is requesting a refill of alprazolam. Also, she states that she has bacterial vaginal again. She would like another rx for that as well.

## 2013-11-24 NOTE — Telephone Encounter (Signed)
Left a detailed message for patient to return my call. Need to know what symptoms pt is having  rx sent

## 2013-11-24 NOTE — Telephone Encounter (Signed)
OK to refill once just clarify symptoms, if no improvement needs to come in for evaluation. Remind probiotics daily

## 2013-11-24 NOTE — Telephone Encounter (Signed)
Discharge and same odor.

## 2013-11-24 NOTE — Telephone Encounter (Signed)
Jillian Reyes at 11/24/2013 1:02 PM     Status: Signed        Discharge and same odor.

## 2013-11-24 NOTE — Telephone Encounter (Signed)
OK to refill flagyl just need to know symptoms

## 2013-11-24 NOTE — Telephone Encounter (Signed)
Please advise 

## 2013-11-24 NOTE — Telephone Encounter (Signed)
Please advise Flagyl being refilled?  Last Alprazolam RX done on 09-22-13 quantity 40 with 0 refills. This is printed for md to sign and fax

## 2013-11-24 NOTE — Telephone Encounter (Signed)
Look at previous note.

## 2013-11-24 NOTE — Telephone Encounter (Signed)
RX sent see please other note

## 2013-11-30 ENCOUNTER — Ambulatory Visit (INDEPENDENT_AMBULATORY_CARE_PROVIDER_SITE_OTHER): Payer: Federal, State, Local not specified - PPO | Admitting: Medical

## 2013-11-30 ENCOUNTER — Encounter: Payer: Self-pay | Admitting: Medical

## 2013-11-30 VITALS — BP 110/73 | HR 105 | Temp 98.8°F | Ht 65.5 in | Wt 161.0 lb

## 2013-11-30 DIAGNOSIS — J309 Allergic rhinitis, unspecified: Secondary | ICD-10-CM

## 2013-11-30 DIAGNOSIS — R062 Wheezing: Secondary | ICD-10-CM | POA: Insufficient documentation

## 2013-11-30 DIAGNOSIS — J0100 Acute maxillary sinusitis, unspecified: Secondary | ICD-10-CM

## 2013-11-30 MED ORDER — FLUTICASONE PROPIONATE 50 MCG/ACT NA SUSP: 2.0000 | Freq: Every day | NASAL | Status: DC

## 2013-11-30 MED ORDER — ALBUTEROL SULFATE HFA 108 (90 BASE) MCG/ACT IN AERS: 2.0000 | INHALATION_SPRAY | Freq: Four times a day (QID) | RESPIRATORY_TRACT | Status: DC | PRN

## 2013-11-30 MED ORDER — AZITHROMYCIN 250 MG PO TABS: ORAL_TABLET | ORAL | Status: DC

## 2013-11-30 MED ORDER — BENZONATATE 100 MG PO CAPS: 100.0000 mg | ORAL_CAPSULE | Freq: Three times a day (TID) | ORAL | Status: DC | PRN

## 2013-11-30 NOTE — Progress Notes (Signed)
Subjective:    Patient ID: Jillian Reyes, female    DOB: [birth year], 23 y.o.   MRN: [id number]  HPI  Pt in states since Friday felt sick. She states sneezing and runny. Then took zyrtec but her symptoms did not get better. In fact states sinus pressure and chest congestion. Pt is coughing up some mucous. Pt does smoke. On average about 1/2 pack a day. History of some allergic rhinitis. Some st this am. Pt some chills last night. LMP- August 27th. 2 family member recently with uri type symptoms. Off and on wheeze. Feels like at time can't take full deep breath but no chest pain. No leg pain.    Past Medical History  Diagnosis Date  . Bipolar 1 disorder   . Chicken pox as a child  . Diabetes mellitus     gestational   . Back pain, lumbosacral   . Allergic state   . Abnormal cells of cervix   . Contraceptive management 01/06/2012  . Eczema 01/06/2012  . Acne 01/06/2012  . Mid back pain   . Contusion of leg 10/15/2012    right  . Dyslipidemia 02/18/2013  . Encounter for preconception consultation 07/27/2013    History   Social History  . Marital Status: Single    Spouse Name: N/A    Number of Children: 1  . Years of Education: N/A   Occupational History  . HR specialist     [employer]   Social History Main Topics  . Smoking status: Current Every Day Smoker -- 1.00 packs/day    Types: Cigarettes  . Smokeless tobacco: Never Used     Comment: wants chantix  . Alcohol Use: No  . Drug Use: No  . Sexual Activity: Yes    Partners: Male    Birth Control/ Protection: Injection     Comment: depo shot   Other Topics Concern  . Not on file   Social History Narrative   Has a son. Living with her mother, stepfather, and brother and sister.     2 caffeinated drinks daily.   On Depo for birth control.     No past surgical history on file.  Family History  Problem Relation Age of Onset  . Heart disease    . Diabetes Father 29    type 2  . Thyroid disease Father   .  Hyperlipidemia      great grandparents  . Migraines Mother   . Kidney disease Mother     No Known Allergies  Current Outpatient Prescriptions on File Prior to Visit  Medication Sig Dispense Refill  . alprazolam (XANAX) 2 MG tablet Take 1 tablet (2 mg total) by mouth 2 (two) times daily as needed for sleep or anxiety.  40 tablet  0  . clindamycin-benzoyl peroxide (BENZACLIN) gel Apply topically 2 (two) times daily.  50 g  5  . clobetasol cream (TEMOVATE) 0.05 % Apply topically daily as needed. eczema  60 g  1  . lamoTRIgine (LAMICTAL) 150 MG tablet TAKE 1 TABLET (150 MG TOTAL) BY MOUTH DAILY.  30 tablet  1  . metroNIDAZOLE (FLAGYL) 500 MG tablet TAKE 1 TABLET (500 MG TOTAL) BY MOUTH 2 (TWO) TIMES DAILY.  14 tablet  0  . SUMAtriptan (IMITREX) 50 MG tablet Take 1 tablet (50 mg total) by mouth once. May repeat in 2 hours if headache persists or recurs.  30 tablet  0  . [DISCONTINUED] naproxen (NAPROSYN) 375 MG tablet Take 1 tablet (375  mg total) by mouth 2 (two) times daily with a meal.  360 tablet  0   No current facility-administered medications on file prior to visit.    BP 110/73  Pulse 105  Temp(Src) 98.8 F (37.1 C) (Oral)  Ht 5' 5.5" (1.664 m)  Wt 161 lb (73.029 kg)  BMI 26.37 kg/m2  SpO2 96%  LMP 11/12/2013   Review of Systems  Constitutional: Negative for fever, chills and fatigue.  HENT: Positive for congestion, postnasal drip, rhinorrhea, sinus pressure, sneezing and sore throat. Negative for ear pain, nosebleeds, trouble swallowing and voice change.   Respiratory: Positive for cough, chest tightness and wheezing.        At times feel like can't take full deep breath.  Cardiovascular: Negative for chest pain and palpitations.  Gastrointestinal: Negative.   Musculoskeletal: Negative.   Hematological: Negative for adenopathy. Does not bruise/bleed easily.  Psychiatric/Behavioral: Negative.        Objective:   Physical Exam  General  Mental Status - Alert.  General Appearance - Well groomed. Not in acute distress.  Skin Rashes- No Rashes.  HEENT Head- Normal. Ear Auditory Canal - Left- Normal. Right - Normal.Tympanic Membrane- Left- Normal. Right- Normal. Eye Sclera/Conjunctiva- Left- Normal. Right- Normal. Nose & Sinuses Nasal Mucosa- Left-  Boggy + Congested. Right-  Boggy + Congested. Mild maxillary sinus pressure to palpation Mouth & Throat Lips: Upper Lip- Normal: no dryness, cracking, pallor, cyanosis, or vesicular eruption. Lower Lip-Normal: no dryness, cracking, pallor, cyanosis or vesicular eruption. Buccal Mucosa- Bilateral- No Aphthous ulcers. Oropharynx- No Discharge or Erythema. Tonsils: Characteristics- Bilateral- faint  Erythema + Congestion. Size/Enlargement- Bilateral- No enlargement. Discharge- bilateral-None.  Neck Neck- Supple. No Masses. No tracheal deviation   Chest and Lung Exam Auscultation: Breath Sounds:-clear, even and unlabored.  Cardiovascular Auscultation:Rythm- Regular, rate and rhythm Murmurs & Other Heart Sounds:Ausculatation of the heart reveal- No Murmurs.  Lymphatic Head & Neck General Head & Neck Lymphatics: Bilateral: Description- No Localized lymphadenopathy.        Assessment & Plan:

## 2013-11-30 NOTE — Assessment & Plan Note (Signed)
Recent after allergic rhinitis symptoms. Pt is also a smoker. Rx of albuterol.

## 2013-11-30 NOTE — Assessment & Plan Note (Signed)
Recent started with. Pt on zyrtec. Rx of fluticasone.

## 2013-11-30 NOTE — Assessment & Plan Note (Signed)
Symptoms occurred quickly after onset of allergies. Pt is smoker with possible bronchitis as well. Did prescribe azithromycin. Sent to her pharmacy.

## 2013-11-30 NOTE — Patient Instructions (Signed)
You appear to have allergic rhinitis complicated  by sinus infection, possible bronchitis and some wheezing all somewhat associated/impacted  with your smoking. Would strongly recommend quitting smoking. I did prescribe a nasal steroid, cough tablet, antibiotic and inhaler today. We sent those to your pharmacy. If your symptoms worsen, change or persist despite treatment then follow up in 7 days or as needed.

## 2013-12-01 ENCOUNTER — Ambulatory Visit: Payer: Federal, State, Local not specified - PPO | Admitting: Family Medicine

## 2013-12-01 ENCOUNTER — Telehealth: Payer: Self-pay | Admitting: Family Medicine

## 2013-12-01 DIAGNOSIS — Z0289 Encounter for other administrative examinations: Secondary | ICD-10-CM

## 2013-12-01 NOTE — Telephone Encounter (Signed)
Pt was seen yesterday.

## 2013-12-01 NOTE — Telephone Encounter (Signed)
Patient had to cancel todays appt. She states that she will call back to reschedule

## 2013-12-20 ENCOUNTER — Other Ambulatory Visit: Payer: Self-pay | Admitting: Family Medicine

## 2013-12-21 ENCOUNTER — Telehealth: Payer: Self-pay | Admitting: Family Medicine

## 2013-12-21 ENCOUNTER — Other Ambulatory Visit: Payer: Self-pay | Admitting: Family Medicine

## 2013-12-21 NOTE — Telephone Encounter (Signed)
[facility] Last filled - 09.08.15, #[id number] Last AEX - 05.11.15 [Acutes only after date] Next AEX - 3 Mths. [No Show 09.15.15, has F/U appt scheduled for 10.13.15] Please Advise on refills/SLS

## 2013-12-21 NOTE — Telephone Encounter (Signed)
Rx request faxed to pharmacy/SLS  

## 2013-12-21 NOTE — Telephone Encounter (Signed)
Rx request to pharmacy/SLS Requested drug refills are authorized, however, the patient needs further evaluation and/or laboratory testing before further refills are given. Ask her to make an appointment for this.  

## 2013-12-21 NOTE — Telephone Encounter (Signed)
Rx already faxed to pharmacy 10.05.15/SLS

## 2013-12-21 NOTE — Telephone Encounter (Signed)
Caller name: Jillian Reyes  Relation to pt: self  Call back number: [phone] [facility] [phone]   Reason for call: pt states phamacy is requesting prio-auth for alprazolam [name](XANAX) 2 MG tablet. Pt is completely out of medication

## 2013-12-21 NOTE — Telephone Encounter (Signed)
Patient called back stating that Dr. Abner GreenspanBlyth only sent in 15 pills and will not last until her appt. Offered appointment for next Tuesday with Dr. Abner GreenspanBlyth, patient declined stating that she could not get out of work

## 2013-12-22 MED ORDER — ALPRAZOLAM 2 MG PO TABS: 2.0000 mg | ORAL_TABLET | Freq: Two times a day (BID) | ORAL | Status: DC | PRN

## 2013-12-22 NOTE — Telephone Encounter (Signed)
Informed patient of this.  °

## 2013-12-22 NOTE — Telephone Encounter (Signed)
OK to disp exactly how many she needs to take max of 2 a day til appt on 10/22

## 2013-12-22 NOTE — Telephone Encounter (Signed)
Inform pt that we sent 21 tablets in.

## 2013-12-29 ENCOUNTER — Ambulatory Visit: Payer: Federal, State, Local not specified - PPO | Admitting: Family Medicine

## 2014-01-07 ENCOUNTER — Ambulatory Visit (INDEPENDENT_AMBULATORY_CARE_PROVIDER_SITE_OTHER): Payer: Federal, State, Local not specified - PPO | Admitting: Family Medicine

## 2014-01-07 ENCOUNTER — Encounter: Payer: Self-pay | Admitting: Family Medicine

## 2014-01-07 VITALS — BP 116/65 | HR 95 | Temp 99.0°F | Ht 65.0 in | Wt 159.6 lb

## 2014-01-07 DIAGNOSIS — M549 Dorsalgia, unspecified: Secondary | ICD-10-CM

## 2014-01-07 DIAGNOSIS — R109 Unspecified abdominal pain: Secondary | ICD-10-CM

## 2014-01-07 DIAGNOSIS — Z309 Encounter for contraceptive management, unspecified: Secondary | ICD-10-CM

## 2014-01-07 DIAGNOSIS — R300 Dysuria: Secondary | ICD-10-CM

## 2014-01-07 DIAGNOSIS — N912 Amenorrhea, unspecified: Secondary | ICD-10-CM

## 2014-01-07 LAB — URINALYSIS
Bilirubin Urine: NEGATIVE
Hgb urine dipstick: NEGATIVE
KETONES UR: NEGATIVE
Leukocytes, UA: NEGATIVE
Nitrite: NEGATIVE
Specific Gravity, Urine: 1.02 (ref 1.000–1.030)
Total Protein, Urine: NEGATIVE
URINE GLUCOSE: NEGATIVE
UROBILINOGEN UA: 1 (ref 0.0–1.0)
pH: 7.5 (ref 5.0–8.0)

## 2014-01-07 MED ORDER — CEFDINIR 300 MG PO CAPS: 300.0000 mg | ORAL_CAPSULE | Freq: Two times a day (BID) | ORAL | Status: AC

## 2014-01-07 MED ORDER — CYCLOBENZAPRINE HCL 10 MG PO TABS: 10.0000 mg | ORAL_TABLET | Freq: Every evening | ORAL | Status: DC | PRN

## 2014-01-07 MED ORDER — LAMOTRIGINE 150 MG PO TABS: 150.0000 mg | ORAL_TABLET | Freq: Every day | ORAL | Status: DC

## 2014-01-07 MED ORDER — MEDROXYPROGESTERONE ACETATE 150 MG/ML IM SUSP: 150.0000 mg | INTRAMUSCULAR | Status: DC

## 2014-01-07 MED ORDER — ALPRAZOLAM 2 MG PO TABS: 2.0000 mg | ORAL_TABLET | Freq: Every evening | ORAL | Status: DC | PRN

## 2014-01-07 NOTE — Progress Notes (Signed)
Pre visit review using our clinic review tool, if applicable. No additional management support is needed unless otherwise documented below in the visit note. 

## 2014-01-07 NOTE — Patient Instructions (Signed)
Salon Pas patches a couple times a day for pain  Call for appt for Depo shot when next cycle starts and we will do a urine pregnancy test and give you your shot   Urinary Tract Infection Urinary tract infections (UTIs) can develop anywhere along your urinary tract. Your urinary tract is your body's drainage system for removing wastes and extra water. Your urinary tract includes two kidneys, two ureters, a bladder, and a urethra. Your kidneys are a pair of bean-shaped organs. Each kidney is about the size of your fist. They are located below your ribs, one on each side of your spine. CAUSES Infections are caused by microbes, which are microscopic organisms, including fungi, viruses, and bacteria. These organisms are so small that they can only be seen through a microscope. Bacteria are the microbes that most commonly cause UTIs. SYMPTOMS  Symptoms of UTIs may vary by age and gender of the patient and by the location of the infection. Symptoms in young women typically include a frequent and intense urge to urinate and a painful, burning feeling in the bladder or urethra during urination. Older women and men are more likely to be tired, shaky, and weak and have muscle aches and abdominal pain. A fever may mean the infection is in your kidneys. Other symptoms of a kidney infection include pain in your back or sides below the ribs, nausea, and vomiting. DIAGNOSIS To diagnose a UTI, your caregiver will ask you about your symptoms. Your caregiver also will ask to provide a urine sample. The urine sample will be tested for bacteria and white blood cells. White blood cells are made by your body to help fight infection. TREATMENT  Typically, UTIs can be treated with medication. Because most UTIs are caused by a bacterial infection, they usually can be treated with the use of antibiotics. The choice of antibiotic and length of treatment depend on your symptoms and the type of bacteria causing your  infection. HOME CARE INSTRUCTIONS  If you were prescribed antibiotics, take them exactly as your caregiver instructs you. Finish the medication even if you feel better after you have only taken some of the medication.  Drink enough water and fluids to keep your urine clear or pale yellow.  Avoid caffeine, tea, and carbonated beverages. They tend to irritate your bladder.  Empty your bladder often. Avoid holding urine for long periods of time.  Empty your bladder before and after sexual intercourse.  After a bowel movement, women should cleanse from front to back. Use each tissue only once. SEEK MEDICAL CARE IF:   You have back pain.  You develop a fever.  Your symptoms do not begin to resolve within 3 days. SEEK IMMEDIATE MEDICAL CARE IF:   You have severe back pain or lower abdominal pain.  You develop chills.  You have nausea or vomiting.  You have continued burning or discomfort with urination. MAKE SURE YOU:   Understand these instructions.  Will watch your condition.  Will get help right away if you are not doing well or get worse. Document Released: 12/13/2004 Document Revised: 09/04/2011 Document Reviewed: 04/13/2011 [facility] Patient Information 2015 [employer], [place]. This information is not intended to replace advice given to you by your health care provider. Make sure you discuss any questions you have with your health care provider.

## 2014-01-08 LAB — RENAL FUNCTION PANEL
Albumin: 3.8 g/dL (ref 3.5–5.2)
BUN: 6 mg/dL (ref 6–23)
CHLORIDE: 107 meq/L (ref 96–112)
CO2: 26 mEq/L (ref 19–32)
Calcium: 9.7 mg/dL (ref 8.4–10.5)
Creatinine, Ser: 1 mg/dL (ref 0.4–1.2)
GFR: 72.26 mL/min (ref >60.00)
Glucose, Bld: 74 mg/dL (ref 70–99)
PHOSPHORUS: 2.8 mg/dL (ref 2.3–4.6)
Potassium: 4.8 mEq/L (ref 3.5–5.1)
Sodium: 143 mEq/L (ref 135–145)

## 2014-01-08 LAB — CBC
HCT: 47.8 % — ABNORMAL HIGH (ref 36.0–46.0)
HEMOGLOBIN: 15.8 g/dL — AB (ref 12.0–15.0)
MCHC: 33 g/dL (ref 30.0–36.0)
MCV: 94.6 fl (ref 78.0–100.0)
PLATELETS: 228 10*3/uL (ref 150.0–400.0)
RBC: 5.05 Mil/uL (ref 3.87–5.11)
RDW: 14 % (ref 11.5–15.5)
WBC: 9 10*3/uL (ref 4.0–10.5)

## 2014-01-08 LAB — URINE CULTURE
Colony Count: NO GROWTH
Organism ID, Bacteria: NO GROWTH

## 2014-01-08 LAB — HCG, SERUM, QUALITATIVE: PREG SERUM: NEGATIVE

## 2014-01-10 ENCOUNTER — Encounter: Payer: Self-pay | Admitting: Family Medicine

## 2014-01-10 DIAGNOSIS — R300 Dysuria: Secondary | ICD-10-CM | POA: Insufficient documentation

## 2014-01-10 NOTE — Assessment & Plan Note (Signed)
Left side, Encouraged moist heat and gentle stretching as tolerated. May try NSAIDs and prescription meds as directed and report if symptoms worsen or seek immediate care, try Salon Pas patches

## 2014-01-10 NOTE — Assessment & Plan Note (Signed)
Cycle due soon, will call when cycle begins to have pregnancy test and restart Depo Provera

## 2014-01-10 NOTE — Assessment & Plan Note (Signed)
Started on Cefdinir and probiotics, increase fluid intake

## 2014-01-10 NOTE — Progress Notes (Signed)
Patient ID: Jillian Reyes, female   DOB: 08/21/1990, 23 y.o.   MRN: [id number] Jillian SauceDebra A Reyes [id number] 09/13/1990 01/10/2014      Progress Note-Follow Up  Subjective  Chief Complaint  Chief Complaint  Patient presents with  . Follow-up    medication check    HPI  Patient is a 23 year old female in today for routine medical care. In today c/o dysuria. But no hematuria. Notes some left flank pain although with description it hurts worse with certain movements. She denies f/c/myalgias otherwise. Is noting a sense of hot flashes for last day. Pain was 8 of 10 yesterday and is 4 of 10 today. Is also requesting restart Depo Provera.   Past Medical History  Diagnosis Date  . Bipolar 1 disorder   . Chicken pox as a child  . Diabetes mellitus     gestational   . Back pain, lumbosacral   . Allergic state   . Abnormal cells of cervix   . Contraceptive management 01/06/2012  . Eczema 01/06/2012  . Acne 01/06/2012  . Mid back pain   . Contusion of leg 10/15/2012    right  . Dyslipidemia 02/18/2013  . Encounter for preconception consultation 07/27/2013    History reviewed. No pertinent past surgical history.  Family History  Problem Relation Age of Onset  . Heart disease    . Diabetes Father 1935    type 2  . Thyroid disease Father   . Hyperlipidemia      great grandparents  . Migraines Mother   . Kidney disease Mother     History   Social History  . Marital Status: Single    Spouse Name: N/A    Number of Children: 1  . Years of Education: N/A   Occupational History  . HR specialist     [employer]   Social History Main Topics  . Smoking status: Current Every Day Smoker -- 1.00 packs/day    Types: Cigarettes  . Smokeless tobacco: Never Used     Comment: wants chantix  . Alcohol Use: No  . Drug Use: No  . Sexual Activity: Yes    Partners: Male    Birth Control/ Protection: Injection     Comment: depo shot   Other Topics Concern  . Not on file   Social History  Narrative   Has a son. Living with her mother, stepfather, and brother and sister.     2 caffeinated drinks daily.   On Depo for birth control.     Current Outpatient Prescriptions on File Prior to Visit  Medication Sig Dispense Refill  . albuterol (PROVENTIL HFA;VENTOLIN HFA) 108 (90 BASE) MCG/ACT inhaler Inhale 2 puffs into the lungs every 6 (six) hours as needed for wheezing or shortness of breath.  1 Inhaler  0  . clobetasol cream (TEMOVATE) 0.05 % Apply topically daily as needed. eczema  60 g  1  . fluticasone (FLONASE) 50 MCG/ACT nasal spray Place 2 sprays into both nostrils daily.  16 g  1  . SUMAtriptan (IMITREX) 50 MG tablet Take 1 tablet (50 mg total) by mouth once. May repeat in 2 hours if headache persists or recurs.  30 tablet  0  . [DISCONTINUED] naproxen (NAPROSYN) 375 MG tablet Take 1 tablet (375 mg total) by mouth 2 (two) times daily with a meal.  360 tablet  0   No current facility-administered medications on file prior to visit.    No Known Allergies  Review of Systems  Review of Systems  Constitutional: Negative for fever and malaise/fatigue.  HENT: Negative for congestion.   Eyes: Negative for discharge.  Respiratory: Negative for shortness of breath.   Cardiovascular: Negative for chest pain, palpitations and leg swelling.  Gastrointestinal: Positive for abdominal pain. Negative for nausea and diarrhea.  Genitourinary: Positive for dysuria and flank pain. Negative for urgency, frequency and hematuria.  Musculoskeletal: Positive for back pain. Negative for falls.  Skin: Negative for rash.  Neurological: Negative for loss of consciousness and headaches.  Endo/Heme/Allergies: Negative for polydipsia.  Psychiatric/Behavioral: Negative for depression and suicidal ideas. The patient is not nervous/anxious and does not have insomnia.     Objective  BP 116/65  Pulse 95  Temp(Src) 99 F (37.2 C) (Oral)  Ht 5\' 5"  (1.651 m)  Wt 159 lb 9.6 oz (72.394 kg)  BMI  26.56 kg/m2  SpO2 97%  LMP 12/13/2013  Physical Exam  Physical Exam  Constitutional: She is oriented to person, place, and time and well-developed, well-nourished, and in no distress. No distress.  HENT:  Head: Normocephalic and atraumatic.  Eyes: Conjunctivae are normal.  Neck: Neck supple. No thyromegaly present.  Cardiovascular: Normal rate, regular rhythm and normal heart sounds.   No murmur heard. Pulmonary/Chest: Effort normal and breath sounds normal. She has no wheezes.  Abdominal: She exhibits no distension and no mass.  Musculoskeletal: She exhibits no edema.  Lymphadenopathy:    She has no cervical adenopathy.  Neurological: She is alert and oriented to person, place, and time.  Skin: Skin is warm and dry. No rash noted. She is not diaphoretic.  Psychiatric: Memory, affect and judgment normal.    Lab Results  Component Value Date   TSH 0.975 02/17/2013   Lab Results  Component Value Date   WBC 9.0 01/07/2014   HGB 15.8* 01/07/2014   HCT 47.8* 01/07/2014   MCV 94.6 01/07/2014   PLT 228.0 01/07/2014   Lab Results  Component Value Date   CREATININE 1.0 01/07/2014   BUN 6 01/07/2014   NA 143 01/07/2014   K 4.8 01/07/2014   CL 107 01/07/2014   CO2 26 01/07/2014   Lab Results  Component Value Date   ALT 20 02/17/2013   AST 17 02/17/2013   ALKPHOS 76 02/17/2013   BILITOT 0.5 02/17/2013   Lab Results  Component Value Date   CHOL 134 02/17/2013   Lab Results  Component Value Date   HDL 36* 02/17/2013   Lab Results  Component Value Date   LDLCALC 68 02/17/2013   Lab Results  Component Value Date   TRIG 149 02/17/2013   Lab Results  Component Value Date   CHOLHDL 3.7 02/17/2013     Assessment & Plan  Mid back pain Left side, Encouraged moist heat and gentle stretching as tolerated. May try NSAIDs and prescription meds as directed and report if symptoms worsen or seek immediate care, try Salon Pas patches   Contraceptive management Cycle due soon,  will call when cycle begins to have pregnancy test and restart Depo Provera  Dysuria Started on Cefdinir and probiotics, increase fluid intake

## 2014-02-02 ENCOUNTER — Ambulatory Visit (INDEPENDENT_AMBULATORY_CARE_PROVIDER_SITE_OTHER): Payer: Federal, State, Local not specified - PPO | Admitting: General Practice

## 2014-02-02 DIAGNOSIS — Z3009 Encounter for other general counseling and advice on contraception: Secondary | ICD-10-CM

## 2014-02-02 LAB — POCT URINE PREGNANCY: Preg Test, Ur: NEGATIVE

## 2014-02-02 MED ORDER — MEDROXYPROGESTERONE ACETATE 150 MG/ML IM SUSP
150.0000 mg | Freq: Once | INTRAMUSCULAR | Status: AC
  Administered 2014-02-02: 150 mg via INTRAMUSCULAR

## 2014-02-04 ENCOUNTER — Telehealth: Payer: Self-pay | Admitting: Family Medicine

## 2014-02-04 ENCOUNTER — Other Ambulatory Visit: Payer: Self-pay | Admitting: Family Medicine

## 2014-02-04 DIAGNOSIS — N760 Acute vaginitis: Secondary | ICD-10-CM

## 2014-02-04 MED ORDER — METRONIDAZOLE 500 MG PO TABS: ORAL_TABLET | ORAL | Status: DC

## 2014-02-04 NOTE — Telephone Encounter (Signed)
Pt is off work please call mobile # [phone]

## 2014-02-04 NOTE — Telephone Encounter (Signed)
I have sent in prescription for Flagyl please let her know

## 2014-02-04 NOTE — Telephone Encounter (Signed)
Pt is requesting an rx for a bacterial infection, wasn't sure of the name, pt states that dr. Abner Greenspanblyth is aware of the recurrent infection that she gets after her monthly cycle. Pt states dr Abner Greenspanblyth has called in the rx before for her, without her having to do an office visit.

## 2014-02-05 NOTE — Telephone Encounter (Signed)
Informed patient of this.  °

## 2014-02-15 ENCOUNTER — Other Ambulatory Visit: Payer: Self-pay | Admitting: Family Medicine

## 2014-02-23 ENCOUNTER — Other Ambulatory Visit: Payer: Self-pay | Admitting: Family Medicine

## 2014-03-02 ENCOUNTER — Other Ambulatory Visit: Payer: Self-pay | Admitting: Family Medicine

## 2014-03-02 NOTE — Telephone Encounter (Signed)
Requesting Clindamycin-benzoyl peroxide (Benzaclin) gel-Apply topically bid. Last refill:02/17/13 Last OV:01/07/14 Please advise.//AB/CMA

## 2014-03-19 HISTORY — PX: WISDOM TOOTH EXTRACTION: SHX21

## 2014-03-23 ENCOUNTER — Telehealth: Payer: Self-pay | Admitting: Family Medicine

## 2014-03-24 ENCOUNTER — Encounter: Payer: Self-pay | Admitting: Internal Medicine

## 2014-03-24 ENCOUNTER — Ambulatory Visit (INDEPENDENT_AMBULATORY_CARE_PROVIDER_SITE_OTHER): Payer: Federal, State, Local not specified - PPO | Admitting: Internal Medicine

## 2014-03-24 ENCOUNTER — Other Ambulatory Visit: Payer: Self-pay

## 2014-03-24 VITALS — BP 105/70 | HR 79 | Temp 97.6°F | Wt 164.1 lb

## 2014-03-24 DIAGNOSIS — N760 Acute vaginitis: Secondary | ICD-10-CM

## 2014-03-24 DIAGNOSIS — B9689 Other specified bacterial agents as the cause of diseases classified elsewhere: Secondary | ICD-10-CM

## 2014-03-24 DIAGNOSIS — N758 Other diseases of Bartholin's gland: Secondary | ICD-10-CM

## 2014-03-24 MED ORDER — CLINDAMYCIN PHOS-BENZOYL PEROX 1-5 % EX GEL: CUTANEOUS | Status: DC

## 2014-03-24 MED ORDER — DOXYCYCLINE HYCLATE 100 MG PO TABS: 100.0000 mg | ORAL_TABLET | Freq: Two times a day (BID) | ORAL | Status: DC

## 2014-03-24 MED ORDER — CLINDAMYCIN PHOSPHATE 2 % VA CREA: 1.0000 | TOPICAL_CREAM | Freq: Every day | VAGINAL | Status: DC

## 2014-03-24 NOTE — Telephone Encounter (Signed)
Pt requesting clindamycin (CLEOCIN) 2 % vaginal cream & doxycycline (VIBRA-TABS) 100 MG tablet  Please send to [facility] [address] [phone]

## 2014-03-24 NOTE — Patient Instructions (Signed)
Take doxycycline as prescribed Warm compress We are  referring you to gynecology, in the meantime call if you get worse  Use the vaginal cream daily for one week

## 2014-03-24 NOTE — Progress Notes (Signed)
Subjective:    Patient ID: Jillian Reyes, female    DOB: [birth year], 24 y.o.   MRN: [id number]  DOS:  03/24/2014 Type of visit - description : acute Interval history: Pain and swelling at the genital area for the last 2 days, no discharge, no fever chills. Thinks is an ingrown hair. Also history of recurrent bacterial infections, at this time is having some vaginal discharge but denies pelvic pain, dysuria, gross hematuria or dyspareunia     Past Medical History  Diagnosis Date  . Bipolar 1 disorder   . Chicken pox as a child  . Diabetes mellitus     gestational   . Back pain, lumbosacral   . Allergic state   . Abnormal cells of cervix   . Contraceptive management 01/06/2012  . Eczema 01/06/2012  . Acne 01/06/2012  . Mid back pain   . Contusion of leg 10/15/2012    right  . Dyslipidemia 02/18/2013  . Encounter for preconception consultation 07/27/2013    History reviewed. No pertinent past surgical history.  History   Social History  . Marital Status: Single    Spouse Name: N/A    Number of Children: 1  . Years of Education: N/A   Occupational History  . HR specialist     [employer]   Social History Main Topics  . Smoking status: Current Every Day Smoker -- 1.00 packs/day    Types: Cigarettes  . Smokeless tobacco: Never Used     Comment: wants chantix  . Alcohol Use: No  . Drug Use: No  . Sexual Activity:    Partners: Male    Birth Control/ Protection: Injection     Comment: depo shot   Other Topics Concern  . Not on file   Social History Narrative   Has a son. Living with her mother, stepfather, and brother and sister.     2 caffeinated drinks daily.   On Depo for birth control.         Medication List       This list is accurate as of: 03/24/14  8:28 PM.  Always use your most recent med list.               albuterol 108 (90 BASE) MCG/ACT inhaler  Commonly known as:  PROVENTIL HFA;VENTOLIN HFA  Inhale 2 puffs into the lungs every 6 (six) hours as  needed for wheezing or shortness of breath.     alprazolam 2 MG tablet  Commonly known as:  XANAX  Take 1 tablet (2 mg total) by mouth at bedtime as needed for sleep or anxiety.     clindamycin 2 % vaginal cream  Commonly known as:  CLEOCIN  Place 1 Applicatorful vaginally at bedtime.     clindamycin-benzoyl peroxide gel  Commonly known as:  BENZACLIN  APPLY TOPICALLY 2 (TWO) TIMES DAILY.     clobetasol cream 0.05 %  Commonly known as:  TEMOVATE  APPLY TOPICALLY DAILY AS NEEDED. ECZEMA     cyclobenzaprine 10 MG tablet  Commonly known as:  FLEXERIL  TAKE 1 TABLET (10 MG TOTAL) BY MOUTH AT BEDTIME AS NEEDED FOR MUSCLE SPASMS.     doxycycline 100 MG tablet  Commonly known as:  VIBRA-TABS  Take 1 tablet (100 mg total) by mouth 2 (two) times daily.     fluticasone 50 MCG/ACT nasal spray  Commonly known as:  FLONASE  Place 2 sprays into both nostrils daily.     lamoTRIgine 150 MG tablet  Commonly known as:  LAMICTAL  Take 1 tablet (150 mg total) by mouth daily.     medroxyPROGESTERone 150 MG/ML injection  Commonly known as:  DEPO-PROVERA  Inject 1 mL (150 mg total) into the muscle every 3 (three) months.     SUMAtriptan 50 MG tablet  Commonly known as:  IMITREX  Take 1 tablet (50 mg total) by mouth once. May repeat in 2 hours if headache persists or recurs.           Objective:   Physical Exam  Genitourinary:      BP 105/70 mmHg  Pulse 79  Temp(Src) 97.6 F (36.4 C) (Oral)  Wt 164 lb 2 oz (74.447 kg)  SpO2 97%  LMP 03/21/2014  General -- alert, well-developed, NAD.   Abdomen-- Not distended, good bowel sounds,soft, non-tender. No rebound or rigidity.   Extremities-- no pretibial edema bilaterally  GU vagina with minimal white discharge, cervix without discharge or lesions, bimanual exam with no mass or CMT Neurologic--  alert & oriented X3. Speech normal, gait appropriate for age, strength symmetric and appropriate for age.  Psych-- Cognition and judgment  appear intact. Cooperative with normal attention span and concentration. No anxious or depressed appearing.       Assessment & Plan:   Bartholin's cyst, Doxycycline, warm compress, refer to gynecology  Frequent vaginitis,  rxclindamycin, send out wep prep, G&C

## 2014-03-24 NOTE — Progress Notes (Signed)
Pre visit review using our clinic review tool, if applicable. No additional management support is needed unless otherwise documented below in the visit note. 

## 2014-03-24 NOTE — Telephone Encounter (Signed)
Medications [facility] NovelPaz [facility], I have resent them [facility] on [address].

## 2014-03-25 ENCOUNTER — Other Ambulatory Visit: Payer: Self-pay | Admitting: Family Medicine

## 2014-03-25 ENCOUNTER — Telehealth: Payer: Self-pay

## 2014-03-25 DIAGNOSIS — N760 Acute vaginitis: Secondary | ICD-10-CM

## 2014-03-25 NOTE — Telephone Encounter (Signed)
Spoke with Dr. Abner GreenspanBlyth, can run urine ancillary testing through [facility] that checks all below if so desired.

## 2014-03-25 NOTE — Telephone Encounter (Signed)
Note printed and ready for pick up

## 2014-03-25 NOTE — Telephone Encounter (Signed)
Spoke with Corrie DandyMary at [facility], informed both GNC and chlamydia swabs were no good, Pt will have to return for recollect, or Pt can return to give urine sample for chlamydia and not GNC. Please advise if you would like Pt to return for urine or reswab altogether.

## 2014-03-25 NOTE — Telephone Encounter (Signed)
Okay to run the urine test

## 2014-03-25 NOTE — Telephone Encounter (Signed)
Called patient and let her know note was ready for pick up.

## 2014-03-25 NOTE — Telephone Encounter (Signed)
Channing MuttersDebra Otero [phone]  Stanton KidneyDebra needs a note for work for yesterday and today, she is in to much to wear pants and go into work

## 2014-03-25 NOTE — Telephone Encounter (Signed)
I have placed an order for the test please arrange for her to come in and have this done. Thanks for seeing her

## 2014-03-26 ENCOUNTER — Telehealth: Payer: Self-pay

## 2014-03-26 NOTE — Telephone Encounter (Signed)
Tried calling Pt to inform her that her appt to GYN has been scheduled. Pts referral appt to GYN is scheduled for Monday, 03/29/2014 at 10:00 with Dr. Jennette KettleNeal at [facility], [address], [place]. Their number is [phone] if she needs to reschedule.

## 2014-03-26 NOTE — Telephone Encounter (Signed)
Spoke with Pt, informed her GNC and wet prep were inconclusive informed her that I spoke with Dr. Drue NovelPaz and Dr. Abner GreenspanBlyth and she could return for a urine test only. Pt verbalized understanding and stated she would return to office sometime today to give sample.

## 2014-04-15 ENCOUNTER — Telehealth: Payer: Self-pay | Admitting: Family Medicine

## 2014-04-15 NOTE — Telephone Encounter (Signed)
Last refill:01/07/14;#30,3. Please advise./AB/CMA

## 2014-04-15 NOTE — Telephone Encounter (Signed)
I am afreai I just cannot replace it til it is due. She will just have to make due until it is due. sorry

## 2014-04-15 NOTE — Telephone Encounter (Signed)
Caller name: Stanton KidneyDebra Relation to pt: self Call back number: [phone] Pharmacy:  Reason for call:   Patient states that she filled the alprazolam rx last night and then was helping her friend pack. Patient states that she lost the pills and wants to know what she should do.

## 2014-04-16 NOTE — Telephone Encounter (Signed)
Patient called back stating that her friend found the medication.

## 2014-05-10 ENCOUNTER — Encounter: Payer: Self-pay | Admitting: Physician Assistant

## 2014-05-10 ENCOUNTER — Ambulatory Visit (INDEPENDENT_AMBULATORY_CARE_PROVIDER_SITE_OTHER): Payer: Federal, State, Local not specified - PPO | Admitting: Physician Assistant

## 2014-05-10 VITALS — BP 112/68 | HR 99 | Temp 98.7°F | Resp 16 | Ht 65.0 in | Wt 174.2 lb

## 2014-05-10 DIAGNOSIS — M25522 Pain in left elbow: Secondary | ICD-10-CM

## 2014-05-10 MED ORDER — METHYLPREDNISOLONE (PAK) 4 MG PO TABS: ORAL_TABLET | ORAL | Status: DC

## 2014-05-10 MED ORDER — TRAMADOL HCL 50 MG PO TABS: 50.0000 mg | ORAL_TABLET | Freq: Two times a day (BID) | ORAL | Status: DC | PRN

## 2014-05-10 NOTE — Patient Instructions (Signed)
Please take medications as directed.   Avoid heavy lifting or overexertion.  Wear ACE wrap during day.  Apply ice if swelling recurs. If symptoms are not improving, we will refer you to Sports Medicine.

## 2014-05-10 NOTE — Assessment & Plan Note (Signed)
Rx Tramadol. Medrol dose pack for nerve irritation.  RICE therapy. Follow-up if no improvement.

## 2014-05-10 NOTE — Progress Notes (Signed)
Pre visit review using our clinic review tool, if applicable. No additional management support is needed unless otherwise documented below in the visit note/SLS  

## 2014-05-10 NOTE — Progress Notes (Signed)
Patient presents to clinic today c/o 2 weeks of intermittent left elbow pain with mild swelling.  Does heavy lifting at work.  Denies trauma or injury.  Has been taking ibuprofen with little relief in symptoms. Is having some tingling in her 4th and 5th fingers.  Past Medical History  Diagnosis Date  . Bipolar 1 disorder   . Chicken pox as a child  . Diabetes mellitus     gestational   . Back pain, lumbosacral   . Allergic state   . Abnormal cells of cervix   . Contraceptive management 01/06/2012  . Eczema 01/06/2012  . Acne 01/06/2012  . Mid back pain   . Contusion of leg 10/15/2012    right  . Dyslipidemia 02/18/2013  . Encounter for preconception consultation 07/27/2013    Current Outpatient Prescriptions on File Prior to Visit  Medication Sig Dispense Refill  . albuterol (PROVENTIL HFA;VENTOLIN HFA) 108 (90 BASE) MCG/ACT inhaler Inhale 2 puffs into the lungs every 6 (six) hours as needed for wheezing or shortness of breath. 1 Inhaler 0  . alprazolam (XANAX) 2 MG tablet Take 1 tablet (2 mg total) by mouth at bedtime as needed for sleep or anxiety. 30 tablet 3  . clindamycin-benzoyl peroxide (BENZACLIN) gel APPLY TOPICALLY 2 (TWO) TIMES DAILY. 50 g 0  . clobetasol cream (TEMOVATE) 0.05 % APPLY TOPICALLY DAILY AS NEEDED. ECZEMA 60 g 1  . cyclobenzaprine (FLEXERIL) 10 MG tablet TAKE 1 TABLET (10 MG TOTAL) BY MOUTH AT BEDTIME AS NEEDED FOR MUSCLE SPASMS. 30 tablet 0  . fluticasone (FLONASE) 50 MCG/ACT nasal spray Place 2 sprays into both nostrils daily. 16 g 1  . lamoTRIgine (LAMICTAL) 150 MG tablet Take 1 tablet (150 mg total) by mouth daily. 90 tablet 1  . SUMAtriptan (IMITREX) 50 MG tablet Take 1 tablet (50 mg total) by mouth once. May repeat in 2 hours if headache persists or recurs. 30 tablet 0  . [DISCONTINUED] naproxen (NAPROSYN) 375 MG tablet Take 1 tablet (375 mg total) by mouth 2 (two) times daily with a meal. 360 tablet 0   No current facility-administered medications on  file prior to visit.    No Known Allergies  Family History  Problem Relation Age of Onset  . Heart disease    . Diabetes Father 2835    type 2  . Thyroid disease Father   . Hyperlipidemia      great grandparents  . Migraines Mother   . Kidney disease Mother     History   Social History  . Marital Status: Single    Spouse Name: N/A  . Number of Children: 1  . Years of Education: N/A   Occupational History  . HR specialist     [employer]   Social History Main Topics  . Smoking status: Current Every Day Smoker -- 1.00 packs/day    Types: Cigarettes  . Smokeless tobacco: Never Used     Comment: wants chantix  . Alcohol Use: No  . Drug Use: No  . Sexual Activity:    Partners: Male    Birth Control/ Protection: Injection     Comment: depo shot   Other Topics Concern  . None   Social History Narrative   Has a son. Living with her mother, stepfather, and brother and sister.     2 caffeinated drinks daily.   On Depo for birth control.    Review of Systems - See HPI.  All other ROS are negative.  BP 112/68  mmHg  Pulse 99  Temp(Src) 98.7 F (37.1 C) (Oral)  Resp 16  Ht  (1.651 m)  Wt 174 lb 4 oz (79.039 kg)  BMI 29.00 kg/m2  SpO2 98%  Physical Exam  Constitutional: She is oriented to person, place, and time and well-developed, well-nourished, and in no distress.  HENT:  Head: Normocephalic and atraumatic.  Cardiovascular: Normal rate, regular rhythm, normal heart sounds and intact distal pulses.   Pulmonary/Chest: Effort normal and breath sounds normal. No respiratory distress. She has no wheezes. She has no rales.  Musculoskeletal:       Left elbow: She exhibits normal range of motion and no swelling. Tenderness found. Lateral epicondyle tenderness noted.       Left forearm: Normal.  Neurological: She is alert and oriented to person, place, and time.  Skin: Skin is warm and dry. No rash noted.  Psychiatric: Affect normal.  Vitals  reviewed.  Assessment/Plan: Left elbow pain Rx Tramadol. Medrol dose pack for nerve irritation.  RICE therapy. Follow-up if no improvement.

## 2014-05-12 ENCOUNTER — Emergency Department (HOSPITAL_BASED_OUTPATIENT_CLINIC_OR_DEPARTMENT_OTHER): Payer: Federal, State, Local not specified - PPO

## 2014-05-12 ENCOUNTER — Emergency Department (HOSPITAL_BASED_OUTPATIENT_CLINIC_OR_DEPARTMENT_OTHER)
Admission: EM | Admit: 2014-05-12 | Discharge: 2014-05-12 | Disposition: A | Discharge status: Home / Self Care | Payer: Federal, State, Local not specified - PPO | Attending: Emergency Medicine | Admitting: Emergency Medicine

## 2014-05-12 ENCOUNTER — Telehealth: Payer: Self-pay | Admitting: Family Medicine

## 2014-05-12 ENCOUNTER — Encounter (HOSPITAL_BASED_OUTPATIENT_CLINIC_OR_DEPARTMENT_OTHER): Payer: Self-pay | Admitting: *Deleted

## 2014-05-12 DIAGNOSIS — F319 Bipolar disorder, unspecified: Secondary | ICD-10-CM | POA: Diagnosis not present

## 2014-05-12 DIAGNOSIS — Z8632 Personal history of gestational diabetes: Secondary | ICD-10-CM | POA: Diagnosis not present

## 2014-05-12 DIAGNOSIS — Z7951 Long term (current) use of inhaled steroids: Secondary | ICD-10-CM | POA: Diagnosis not present

## 2014-05-12 DIAGNOSIS — Z872 Personal history of diseases of the skin and subcutaneous tissue: Secondary | ICD-10-CM | POA: Diagnosis not present

## 2014-05-12 DIAGNOSIS — Z720 Tobacco use: Secondary | ICD-10-CM | POA: Diagnosis not present

## 2014-05-12 DIAGNOSIS — K2970 Gastritis, unspecified, without bleeding: Secondary | ICD-10-CM | POA: Diagnosis not present

## 2014-05-12 DIAGNOSIS — R1084 Generalized abdominal pain: Secondary | ICD-10-CM | POA: Diagnosis present

## 2014-05-12 DIAGNOSIS — Z3202 Encounter for pregnancy test, result negative: Secondary | ICD-10-CM | POA: Diagnosis not present

## 2014-05-12 DIAGNOSIS — Z87828 Personal history of other (healed) physical injury and trauma: Secondary | ICD-10-CM | POA: Diagnosis not present

## 2014-05-12 DIAGNOSIS — Z8619 Personal history of other infectious and parasitic diseases: Secondary | ICD-10-CM | POA: Insufficient documentation

## 2014-05-12 DIAGNOSIS — Z79899 Other long term (current) drug therapy: Secondary | ICD-10-CM | POA: Insufficient documentation

## 2014-05-12 DIAGNOSIS — Z87448 Personal history of other diseases of urinary system: Secondary | ICD-10-CM | POA: Insufficient documentation

## 2014-05-12 DIAGNOSIS — R112 Nausea with vomiting, unspecified: Secondary | ICD-10-CM

## 2014-05-12 DIAGNOSIS — Z8639 Personal history of other endocrine, nutritional and metabolic disease: Secondary | ICD-10-CM | POA: Diagnosis not present

## 2014-05-12 LAB — URINALYSIS, ROUTINE W REFLEX MICROSCOPIC
Bilirubin Urine: NEGATIVE
GLUCOSE, UA: NEGATIVE mg/dL
HGB URINE DIPSTICK: NEGATIVE
Ketones, ur: NEGATIVE mg/dL
Nitrite: NEGATIVE
PROTEIN: NEGATIVE mg/dL
Specific Gravity, Urine: 1.027 (ref 1.005–1.030)
Urobilinogen, UA: 0.2 mg/dL (ref 0.0–1.0)
pH: 5.5 (ref 5.0–8.0)

## 2014-05-12 LAB — CBC WITH DIFFERENTIAL/PLATELET
BASOS ABS: 0 10*3/uL (ref 0.0–0.1)
Basophils Relative: 0 % (ref 0–1)
EOS PCT: 2 % (ref 0–5)
Eosinophils Absolute: 0.1 10*3/uL (ref 0.0–0.7)
HEMATOCRIT: 45.4 % (ref 36.0–46.0)
Hemoglobin: 15.4 g/dL — ABNORMAL HIGH (ref 12.0–15.0)
LYMPHS ABS: 0.6 10*3/uL — AB (ref 0.7–4.0)
LYMPHS PCT: 8 % — AB (ref 12–46)
MCH: 31.4 pg (ref 26.0–34.0)
MCHC: 33.9 g/dL (ref 30.0–36.0)
MCV: 92.5 fL (ref 78.0–100.0)
Monocytes Absolute: 0.7 10*3/uL (ref 0.1–1.0)
Monocytes Relative: 8 % (ref 3–12)
NEUTROS ABS: 6.7 10*3/uL (ref 1.7–7.7)
Neutrophils Relative %: 82 % — ABNORMAL HIGH (ref 43–77)
PLATELETS: 186 10*3/uL (ref 150–400)
RBC: 4.91 MIL/uL (ref 3.87–5.11)
RDW: 13.5 % (ref 11.5–15.5)
WBC: 8.1 10*3/uL (ref 4.0–10.5)

## 2014-05-12 LAB — LIPASE, BLOOD: Lipase: 24 U/L (ref 11–59)

## 2014-05-12 LAB — COMPREHENSIVE METABOLIC PANEL
ALBUMIN: 3.8 g/dL (ref 3.5–5.2)
ALK PHOS: 52 U/L (ref 39–117)
ALT: 21 U/L (ref 0–35)
ANION GAP: 2 — AB (ref 5–15)
AST: 16 U/L (ref 0–37)
BILIRUBIN TOTAL: 0.9 mg/dL (ref 0.3–1.2)
BUN: 16 mg/dL (ref 6–23)
CHLORIDE: 111 mmol/L (ref 96–112)
CO2: 23 mmol/L (ref 19–32)
Calcium: 7.9 mg/dL — ABNORMAL LOW (ref 8.4–10.5)
Creatinine, Ser: 0.71 mg/dL (ref 0.50–1.10)
GFR calc Af Amer: >90 mL/min (ref >90)
GFR calc non Af Amer: >90 mL/min (ref >90)
GLUCOSE: 101 mg/dL — AB (ref 70–99)
Potassium: 3.5 mmol/L (ref 3.5–5.1)
Sodium: 136 mmol/L (ref 135–145)
TOTAL PROTEIN: 6.9 g/dL (ref 6.0–8.3)

## 2014-05-12 LAB — WET PREP, GENITAL
Trich, Wet Prep: NONE SEEN
WBC, Wet Prep HPF POC: NONE SEEN
YEAST WET PREP: NONE SEEN

## 2014-05-12 LAB — URINE MICROSCOPIC-ADD ON

## 2014-05-12 LAB — OCCULT BLOOD GASTRIC / DUODENUM (SPECIMEN CUP)
Occult Blood, Gastric: NEGATIVE
pH, Gastric: 4

## 2014-05-12 LAB — PREGNANCY, URINE: PREG TEST UR: NEGATIVE

## 2014-05-12 MED ORDER — ONDANSETRON HCL 4 MG/2ML IJ SOLN
4.0000 mg | Freq: Once | INTRAMUSCULAR | Status: AC
  Administered 2014-05-12: 4 mg via INTRAVENOUS
  Filled 2014-05-12: qty 2

## 2014-05-12 MED ORDER — MORPHINE SULFATE 4 MG/ML IJ SOLN
4.0000 mg | Freq: Once | INTRAMUSCULAR | Status: DC
  Filled 2014-05-12: qty 1

## 2014-05-12 MED ORDER — KETOROLAC TROMETHAMINE 30 MG/ML IJ SOLN
30.0000 mg | Freq: Once | INTRAMUSCULAR | Status: AC
  Administered 2014-05-12: 30 mg via INTRAVENOUS
  Filled 2014-05-12: qty 1

## 2014-05-12 MED ORDER — SODIUM CHLORIDE 0.9 % IV BOLUS (SEPSIS)
1000.0000 mL | Freq: Once | INTRAVENOUS | Status: AC
  Administered 2014-05-12: 1000 mL via INTRAVENOUS

## 2014-05-12 MED ORDER — OXYCODONE-ACETAMINOPHEN 5-325 MG PO TABS: 1.0000 | ORAL_TABLET | ORAL | Status: DC | PRN

## 2014-05-12 MED ORDER — ONDANSETRON 8 MG PO TBDP: ORAL_TABLET | ORAL | Status: DC

## 2014-05-12 NOTE — Discharge Instructions (Signed)
1. Medications: zofran, percocet, usual home medications 2. Treatment: rest, drink plenty of fluids, advance diet slowly 3. Follow Up: Please followup with your primary doctor in 2 days for discussion of your diagnoses and further evaluation after today's visit; if you do not have a primary care doctor use the resource guide provided to find one; Please return to the ER for persistent vomiting, high fevers or worsening symptoms    Gastritis, Adult Gastritis is soreness and swelling (inflammation) of the lining of the stomach. Gastritis can develop as a sudden onset (acute) or long-term (chronic) condition. If gastritis is not treated, it can lead to stomach bleeding and ulcers. CAUSES  Gastritis occurs when the stomach lining is weak or damaged. Digestive juices from the stomach then inflame the weakened stomach lining. The stomach lining may be weak or damaged due to viral or bacterial infections. One common bacterial infection is the Helicobacter pylori infection. Gastritis can also result from excessive alcohol consumption, taking certain medicines, or having too much acid in the stomach.  SYMPTOMS  In some cases, there are no symptoms. When symptoms are present, they may include:  Pain or a burning sensation in the upper abdomen.  Nausea.  Vomiting.  An uncomfortable feeling of fullness after eating. DIAGNOSIS  Your caregiver may suspect you have gastritis based on your symptoms and a physical exam. To determine the cause of your gastritis, your caregiver may perform the following:  Blood or stool tests to check for the H pylori bacterium.  Gastroscopy. A thin, flexible tube (endoscope) is passed down the esophagus and into the stomach. The endoscope has a light and camera on the end. Your caregiver uses the endoscope to view the inside of the stomach.  Taking a tissue sample (biopsy) from the stomach to examine under a microscope. TREATMENT  Depending on the cause of your gastritis,  medicines may be prescribed. If you have a bacterial infection, such as an H pylori infection, antibiotics may be given. If your gastritis is caused by too much acid in the stomach, H2 blockers or antacids may be given. Your caregiver may recommend that you stop taking aspirin, ibuprofen, or other nonsteroidal anti-inflammatory drugs (NSAIDs). HOME CARE INSTRUCTIONS  Only take over-the-counter or prescription medicines as directed by your caregiver.  If you were given antibiotic medicines, take them as directed. Finish them even if you start to feel better.  Drink enough fluids to keep your urine clear or pale yellow.  Avoid foods and drinks that make your symptoms worse, such as:  Caffeine or alcoholic drinks.  Chocolate.  Peppermint or mint flavorings.  Garlic and onions.  Spicy foods.  Citrus fruits, such as oranges, lemons, or limes.  Tomato-based foods such as sauce, chili, salsa, and pizza.  Fried and fatty foods.  Eat small, frequent meals instead of large meals. SEEK IMMEDIATE MEDICAL CARE IF:   You have black or dark red stools.  You vomit blood or material that looks like coffee grounds.  You are unable to keep fluids down.  Your abdominal pain gets worse.  You have a fever.  You do not feel better after 1 week.  You have any other questions or concerns. MAKE SURE YOU:  Understand these instructions.  Will watch your condition.  Will get help right away if you are not doing well or get worse. Document Released: 02/27/2001 Document Revised: 09/04/2011 Document Reviewed: 04/18/2011 [facility] Patient Information 2015 [employer] [place]. This information is not intended to replace advice given to you  by your health care provider. Make sure you discuss any questions you have with your health care provider. ° °

## 2014-05-12 NOTE — ED Notes (Signed)
PA aware of gastric occult blood results are neg.

## 2014-05-12 NOTE — ED Provider Notes (Signed)
CSN: [id number]     Arrival date & time 05/12/14  1202 History   First MD Initiated Contact with Patient 05/12/14 1240     Chief Complaint  Patient presents with  . Abdominal Pain     (Consider location/radiation/quality/duration/timing/severity/associated sxs/prior Treatment) Patient is a 24 y.o. female presenting with abdominal pain. The history is provided by the patient and medical records. No language interpreter was used.  Abdominal Pain Associated symptoms: nausea and vomiting   Associated symptoms: no chest pain, no constipation, no cough, no diarrhea, no dysuria, no fatigue, no fever, no hematuria and no shortness of breath     Jillian Reyes is a 24 y.o. female  with a hx of bipolar disorder presents to the Emergency Department complaining of gradual, persistent, progressively worsening epigastric abd pain onset this morning.  Pt reports emesis this morning was dark brown and thick.  She reports 3 episodes of emesis this morning.  Pt denies previous abd surgeries. Pain is generalized and described as sharp.  Pt reports associated nausea, but denies diarrhea.  Pt reports last BM was 3 days ago and was normal at that time.  No treatments PTA.   Nothing makes it better and nothing makes it worse.  Pt denies fever, chills, headache, neck pain, chest pain SOB, diarrhea, weakness, dizziness, syncope, dysuria, hematuria.  Pt takes depo-provera.  Pt denies Hx of GERD, EtOH consumption, anticoagulation.  Pt has been Aleve 2 tablets several times per day for the last several days.    Past Medical History  Diagnosis Date  . Bipolar 1 disorder   . Chicken pox as a child  . Diabetes mellitus     gestational   . Back pain, lumbosacral   . Allergic state   . Abnormal cells of cervix   . Contraceptive management 01/06/2012  . Eczema 01/06/2012  . Acne 01/06/2012  . Mid back pain   . Contusion of leg 10/15/2012    right  . Dyslipidemia 02/18/2013  . Encounter for preconception consultation  07/27/2013   History reviewed. No pertinent past surgical history. Family History  Problem Relation Age of Onset  . Heart disease    . Diabetes Father 25    type 2  . Thyroid disease Father   . Hyperlipidemia      great grandparents  . Migraines Mother   . Kidney disease Mother    History  Substance Use Topics  . Smoking status: Current Every Day Smoker -- 1.00 packs/day    Types: Cigarettes  . Smokeless tobacco: Never Used     Comment: wants chantix  . Alcohol Use: No   OB History    No data available     Review of Systems  Constitutional: Negative for fever, diaphoresis, appetite change, fatigue and unexpected weight change.  HENT: Negative for mouth sores and trouble swallowing.   Respiratory: Negative for cough, chest tightness, shortness of breath, wheezing and stridor.   Cardiovascular: Negative for chest pain and palpitations.  Gastrointestinal: Positive for nausea, vomiting and abdominal pain. Negative for diarrhea, constipation, blood in stool, abdominal distention and rectal pain.  Genitourinary: Negative for dysuria, urgency, frequency, hematuria, flank pain and difficulty urinating.  Musculoskeletal: Negative for back pain, neck pain and neck stiffness.  Skin: Negative for rash.  Neurological: Negative for weakness.  Hematological: Negative for adenopathy.  Psychiatric/Behavioral: Negative for confusion.  All other systems reviewed and are negative.     Allergies  Review of patient's allergies indicates no known allergies.  Home Medications   Prior to Admission medications   Medication Sig Start Date End Date Taking? Authorizing Provider  albuterol (PROVENTIL HFA;VENTOLIN HFA) 108 (90 BASE) MCG/ACT inhaler Inhale 2 puffs into the lungs every 6 (six) hours as needed for wheezing or shortness of breath. 11/30/13   Bayard Beaver Saguier, PA-C  alprazolam [name]) 2 MG tablet Take 1 tablet (2 mg total) by mouth at bedtime as needed for sleep or anxiety. 01/07/14    Bradd Canary, MD  clindamycin-benzoyl peroxide (BENZACLIN) gel APPLY TOPICALLY 2 (TWO) TIMES DAILY. 03/24/14   Wanda Plump, MD  clobetasol cream (TEMOVATE) 0.05 % APPLY TOPICALLY DAILY AS NEEDED. ECZEMA 02/23/14   Bradd Canary, MD  cyclobenzaprine (FLEXERIL) 10 MG tablet TAKE 1 TABLET (10 MG TOTAL) BY MOUTH AT BEDTIME AS NEEDED FOR MUSCLE SPASMS. 02/15/14   Bradd Canary, MD  fluticasone (FLONASE) 50 MCG/ACT nasal spray Place 2 sprays into both nostrils daily. 11/30/13   Bayard Beaver Saguier, PA-C  lamoTRIgine (LAMICTAL) 150 MG tablet Take 1 tablet (150 mg total) by mouth daily. 01/07/14   Bradd Canary, MD  methylPREDNIsolone (MEDROL DOSPACK) 4 MG tablet follow package directions 05/10/14   Waldon Merl, PA-C  ondansetron [facility] ODT) 8 MG disintegrating tablet  ODT q4 hours prn nausea 05/12/14   Zaidyn Claire, PA-C  oxyCODONE-acetaminophen (PERCOCET) 5-325 MG per tablet Take 1-2 tablets by mouth every 4 (four) hours as needed. 05/12/14   Godric Lavell, PA-C  SUMAtriptan (IMITREX) 50 MG tablet Take 1 tablet (50 mg total) by mouth once. May repeat in 2 hours if headache persists or recurs. 11/17/13   Waldon Merl, PA-C  traMADol (ULTRAM) 50 MG tablet Take 1 tablet (50 mg total) by mouth every 12 (twelve) hours as needed. 05/10/14   Waldon Merl, PA-C   BP 105/62 mmHg  Pulse 102  Temp(Src) 98.7 F (37.1 C) (Oral)  Resp 18  SpO2 98% Physical Exam  Constitutional: She appears well-developed and well-nourished. No distress.  HENT:  Head: Normocephalic and atraumatic.  Mouth/Throat: Oropharynx is clear and moist.  Eyes: Conjunctivae are normal. No scleral icterus.  Neck: Normal range of motion.  Cardiovascular: Normal rate, regular rhythm, normal heart sounds and intact distal pulses.   No murmur heard. Pulmonary/Chest: Effort normal and breath sounds normal. No respiratory distress. She has no wheezes.  Abdominal: Soft. Bowel sounds are normal. She exhibits no distension and  no mass. There is tenderness (generalized ). There is no rebound, no guarding and no CVA tenderness. Hernia confirmed negative in the right inguinal area and confirmed negative in the left inguinal area.  Genitourinary: Uterus normal. No labial fusion. There is no rash, tenderness or lesion on the right labia. There is no rash, tenderness or lesion on the left labia. Uterus is not deviated, not enlarged, not fixed and not tender. Cervix exhibits no motion tenderness, no discharge and no friability. Right adnexum displays no mass, no tenderness and no fullness. Left adnexum displays no mass, no tenderness and no fullness. No erythema, tenderness or bleeding in the vagina. No foreign body around the vagina. No signs of injury around the vagina. No vaginal discharge found.  Musculoskeletal: Normal range of motion. She exhibits no edema.  Lymphadenopathy:       Right: No inguinal adenopathy present.       Left: No inguinal adenopathy present.  Neurological: She is alert.  Skin: Skin is warm and dry. She is not diaphoretic. No erythema.  Psychiatric: She  has a normal mood and affect.  Nursing note and vitals reviewed.   ED Course  Procedures (including critical care time) Labs Review Labs Reviewed  WET PREP, GENITAL - Abnormal; Notable for the following:    Clue Cells Wet Prep HPF POC FEW (*)    All other components within normal limits  CBC WITH DIFFERENTIAL/PLATELET - Abnormal; Notable for the following:    Hemoglobin 15.4 (*)    Neutrophils Relative % 82 (*)    Lymphocytes Relative 8 (*)    Lymphs Abs 0.6 (*)    All other components within normal limits  COMPREHENSIVE METABOLIC PANEL - Abnormal; Notable for the following:    Glucose, Bld 101 (*)    Calcium 7.9 (*)    Anion gap 2 (*)    All other components within normal limits  URINALYSIS, ROUTINE W REFLEX MICROSCOPIC - Abnormal; Notable for the following:    APPearance CLOUDY (*)    Leukocytes, UA SMALL (*)    All other components  within normal limits  URINE MICROSCOPIC-ADD ON - Abnormal; Notable for the following:    Squamous Epithelial / LPF MANY (*)    Bacteria, UA FEW (*)    All other components within normal limits  OCCULT BLOOD GASTRIC / DUODENUM (SPECIMEN CUP)  LIPASE, BLOOD  PREGNANCY, URINE  GC/CHLAMYDIA PROBE AMP ([place])    Imaging Review Dg Abd Acute W/chest  05/12/2014   CLINICAL DATA:  Three-day history of nausea and vomiting  EXAM: ACUTE ABDOMEN SERIES (ABDOMEN 2 VIEW & CHEST 1 VIEW)  COMPARISON:  None.  FINDINGS: PA chest: No edema or consolidation. Heart size and pulmonary vascularity are normal. No adenopathy.  Supine and upright abdomen: There is diffuse stool throughout the colon. There is no bowel dilatation or air-fluid level suggesting obstruction. No free air. No abnormal calcifications. There is thoracolumbar dextroscoliosis.  IMPRESSION: Diffuse stool throughout colon. Bowel gas pattern unremarkable. Lungs clear.   Electronically Signed   By: Bretta BangWilliam  Woodruff III M.D.   On: 05/12/2014 14:57     EKG Interpretation None      MDM   Final diagnoses:  Gastritis  Non-intractable vomiting with nausea, vomiting of unspecified type   Janae Sauceebra A Otero presents with hx and physical consistent with gastritis.  Patient with symptoms consistent with gastritis.  Likely NSAID in nature.  Patient also concerns of hematemesis however emesis was tested and negative.  Vitals are stable, no fever or tachycardia.  Patient is nontoxic, nonseptic appearing, in no apparent distress.  Patient does not meet the SIRS or Sepsis criteria.  Pt's symptoms have been managed in the department; fluid bolus given.  No signs of dehydration, tolerating PO fluids > 6 oz.  Lungs are clear.  No focal abdominal pain, no peritoneal signs, no concern for appendicitis, cholecystitis, pancreatitis, ruptured viscus, UTI, kidney stone, PID, ectopic pregnancy.  Supportive therapy indicated.  Patient counseled, expresses understanding  and agrees with plan.  I have personally reviewed patient's vitals, nursing note and any pertinent labs or imaging.  I performed an undressed physical exam.    It has been determined that no acute conditions requiring further emergency intervention are present at this time. The patient/guardian have been advised of the diagnosis and plan. I reviewed all labs and imaging including any potential incidental findings. We have discussed signs and symptoms that warrant return to the ED and they are listed in the discharge instructions.    Vital signs are stable at discharge.   BP 105/62  mmHg  Pulse 102  Temp(Src) 98.7 F (37.1 C) (Oral)  Resp 18  SpO2 98%          Dierdre Forth, PA-C 05/12/14 1708  Tilden Fossa, MD 05/17/14 0900

## 2014-05-12 NOTE — Telephone Encounter (Signed)
Patient called in stating that she has had an upset stomach for the past few days and has not been able to eat anything and thinks that she is now throwing up her own fecal matter. Call transferred to [facility].

## 2014-05-12 NOTE — ED Notes (Signed)
PA at bedside.

## 2014-05-12 NOTE — ED Notes (Signed)
Patient states she is having abd pain since last night, states this morning she vomited and described thick brown emesis

## 2014-05-13 ENCOUNTER — Other Ambulatory Visit: Payer: Self-pay | Admitting: Family Medicine

## 2014-05-13 LAB — GC/CHLAMYDIA PROBE AMP (~~LOC~~) NOT AT ARMC
CHLAMYDIA, DNA PROBE: NEGATIVE
NEISSERIA GONORRHEA: NEGATIVE

## 2014-05-14 ENCOUNTER — Encounter: Payer: Self-pay | Admitting: Family Medicine

## 2014-05-14 NOTE — Telephone Encounter (Signed)
Patient informed to pickup hardcopy and to do contract.

## 2014-05-14 NOTE — Telephone Encounter (Signed)
Called the patient left a detailed message hardcopy needs to be picked up as controlled substance contact needs to be signed before getting prescription. Put both hardcopy and contract at the front desk.

## 2014-05-14 NOTE — Telephone Encounter (Signed)
I have signed rx but she needs to [facility]

## 2014-05-17 ENCOUNTER — Telehealth: Payer: Self-pay | Admitting: Family Medicine

## 2014-05-17 NOTE — Telephone Encounter (Signed)
Caller name: Stanton KidneyDebra Relation to pt: self Call back number:  [phone] Pharmacy:  Reason for call:   Patient states that she lost the alprazolam rx and wanted to know what she should do about this? She states that the pharmacy lost the rx.

## 2014-05-17 NOTE — Telephone Encounter (Signed)
Advise please no [employer] contract was signed 01/08/14 did give a urine sample. Please read note dated 04/15/14.

## 2014-05-17 NOTE — Telephone Encounter (Signed)
I do not replace lost prescriptions, please check with pharmacy and see what they say about this situation. She does need to sign drug contract

## 2014-05-18 NOTE — Telephone Encounter (Signed)
[facility] and last refill for alprazolam was 04/13/14.  They stated though that most of her prescriptions are filled [facility] [address] and I did call there, neither pharmacy has any records of her dropping off the alprazolam prescription she was to pickup at our office on 05/14/14 (see 05/13/14 telephone noted where documented I did tell her to pickup hardcopy and sign U[employer] contract).  Did call the patient left msg. To call back

## 2014-05-18 NOTE — Telephone Encounter (Signed)
Called left message to call back 

## 2014-05-18 NOTE — Telephone Encounter (Signed)
Sorry I cannot replace til it is time

## 2014-05-18 NOTE — Telephone Encounter (Signed)
Patient returned phone call. Best # [phone]

## 2014-05-18 NOTE — Telephone Encounter (Signed)
Patient informed of all information regarding refill request.  The patient stated she gave the hardcopy to her boy friend to drop off at the pharmacy and states the pharmacy lost it.  Both pharmacies never received this prescription.  I did inform her once again that PCP will not replace stolen prescriptions.

## 2014-05-20 NOTE — Telephone Encounter (Signed)
Patient informed of PCP instructions.  She did understand and did ask if there is anything that she can take in the mean time , she did say she would be willing to schedule appt. If necessary.

## 2014-05-20 NOTE — Telephone Encounter (Signed)
Tell her to try over the counter Valerian root caps. These are an herbal supplement that can be calming can take 1 3 x a day to help. See what that does.

## 2014-05-20 NOTE — Telephone Encounter (Signed)
Call left msg. To call back

## 2014-05-21 NOTE — Telephone Encounter (Signed)
Called left message to call back 

## 2014-05-21 NOTE — Telephone Encounter (Signed)
Patient informed of PCP instructions on OTC supplement.

## 2014-06-10 ENCOUNTER — Ambulatory Visit (INDEPENDENT_AMBULATORY_CARE_PROVIDER_SITE_OTHER): Payer: Federal, State, Local not specified - PPO | Admitting: Medical

## 2014-06-10 ENCOUNTER — Encounter: Payer: Self-pay | Admitting: Medical

## 2014-06-10 VITALS — BP 120/69 | HR 114 | Temp 98.7°F | Ht 65.0 in | Wt 174.0 lb

## 2014-06-10 DIAGNOSIS — N926 Irregular menstruation, unspecified: Secondary | ICD-10-CM | POA: Insufficient documentation

## 2014-06-10 DIAGNOSIS — R1032 Left lower quadrant pain: Secondary | ICD-10-CM | POA: Diagnosis not present

## 2014-06-10 DIAGNOSIS — L03314 Cellulitis of groin: Secondary | ICD-10-CM | POA: Diagnosis not present

## 2014-06-10 DIAGNOSIS — L0390 Cellulitis, unspecified: Secondary | ICD-10-CM | POA: Insufficient documentation

## 2014-06-10 DIAGNOSIS — N910 Primary amenorrhea: Secondary | ICD-10-CM

## 2014-06-10 LAB — POCT URINALYSIS DIPSTICK
Bilirubin, UA: NEGATIVE
Blood, UA: NEGATIVE
Glucose, UA: NEGATIVE
Leukocytes, UA: NEGATIVE
Nitrite, UA: NEGATIVE
PH UA: 6.5
SPEC GRAV UA: 1.015
Urobilinogen, UA: 0.2

## 2014-06-10 MED ORDER — DOXYCYCLINE HYCLATE 100 MG PO TABS: 100.0000 mg | ORAL_TABLET | Freq: Two times a day (BID) | ORAL | Status: DC

## 2014-06-10 NOTE — Patient Instructions (Signed)
Cellulitis Rx of doxycycline today. If area reoccurs would see gyn since in past sounds like she had more severe presentation. And Dr. Drue NovelPaz did refer in past.  If signs and symptoms persist let us know.  Follow up in 7 days or as needed.    Late menses After skipped depo likley cause. She is not pregnant.

## 2014-06-10 NOTE — Progress Notes (Signed)
Pre visit review using our clinic review tool, if applicable. No additional management support is needed unless otherwise documented below in the visit note. 

## 2014-06-10 NOTE — Assessment & Plan Note (Signed)
Rx of doxycycline today. If area reoccurs would see gyn since in past sounds like she had more severe presentation. And Dr. Drue NovelPaz did refer in past.  If signs and symptoms persist let us know.  Follow up in 7 days or as needed.

## 2014-06-10 NOTE — Progress Notes (Signed)
Subjective:    Patient ID: Jillian Reyes, female    DOB: 06/30/1990, 24 y.o.   MRN: [id number]  HPI   2 days of left suprapubic area swollen and red. No fever, no chills or sweats. Hx of this area being much more swollen and and tender in the January when she had infection. She was treated with antibiotic and area resolved quickly. So when she got recent small red area she came in quickly before got worse.     Lmp- October.  Pt missed February depoprovera.   Review of Systems  Constitutional: Negative for fever, chills and fatigue.  Respiratory: Negative for cough, choking, shortness of breath and wheezing.   Cardiovascular: Negative for chest pain and palpitations.  Gastrointestinal: Negative for abdominal pain.  Skin:       Lt groin region small red and tender area.  Hematological: Negative for adenopathy. Does not bruise/bleed easily.   Past Medical History  Diagnosis Date  . Bipolar 1 disorder   . Chicken pox as a child  . Diabetes mellitus     gestational   . Back pain, lumbosacral   . Allergic state   . Abnormal cells of cervix   . Contraceptive management 01/06/2012  . Eczema 01/06/2012  . Acne 01/06/2012  . Mid back pain   . Contusion of leg 10/15/2012    right  . Dyslipidemia 02/18/2013  . Encounter for preconception consultation 07/27/2013    History   Social History  . Marital Status: Single    Spouse Name: N/A  . Number of Children: 1  . Years of Education: N/A   Occupational History  . HR specialist     [employer]   Social History Main Topics  . Smoking status: Current Every Day Smoker -- 1.00 packs/day    Types: Cigarettes  . Smokeless tobacco: Never Used     Comment: wants chantix  . Alcohol Use: No  . Drug Use: No  . Sexual Activity:    Partners: Male    Birth Control/ Protection: Injection     Comment: depo shot   Other Topics Concern  . Not on file   Social History Narrative   Has a son. Living with her mother, stepfather, and brother  and sister.     2 caffeinated drinks daily.   On Depo for birth control.     No past surgical history on file.  Family History  Problem Relation Age of Onset  . Heart disease    . Diabetes Father 8135    type 2  . Thyroid disease Father   . Hyperlipidemia      great grandparents  . Migraines Mother   . Kidney disease Mother     No Known Allergies  Current Outpatient Prescriptions on File Prior to Visit  Medication Sig Dispense Refill  . albuterol (PROVENTIL HFA;VENTOLIN HFA) 108 (90 BASE) MCG/ACT inhaler Inhale 2 puffs into the lungs every 6 (six) hours as needed for wheezing or shortness of breath. 1 Inhaler 0  . alprazolam (XANAX) 2 MG tablet TAKE ONE TABLET BY MOUTH AT BEDTIME AS NEEDED FOR SLEEP OR ANXIETY 30 tablet 1  . clindamycin-benzoyl peroxide (BENZACLIN) gel APPLY TOPICALLY 2 (TWO) TIMES DAILY. 50 g 0  . clobetasol cream (TEMOVATE) 0.05 % APPLY TOPICALLY DAILY AS NEEDED. ECZEMA 60 g 1  . cyclobenzaprine (FLEXERIL) 10 MG tablet TAKE 1 TABLET (10 MG TOTAL) BY MOUTH AT BEDTIME AS NEEDED FOR MUSCLE SPASMS. 30 tablet 0  . fluticasone (  FLONASE) 50 MCG/ACT nasal spray Place 2 sprays into both nostrils daily. 16 g 1  . lamoTRIgine (LAMICTAL) 150 MG tablet Take 1 tablet (150 mg total) by mouth daily. 90 tablet 1  . SUMAtriptan (IMITREX) 50 MG tablet Take 1 tablet (50 mg total) by mouth once. May repeat in 2 hours if headache persists or recurs. 30 tablet 0  . [DISCONTINUED] naproxen (NAPROSYN) 375 MG tablet Take 1 tablet (375 mg total) by mouth 2 (two) times daily with a meal. 360 tablet 0   No current facility-administered medications on file prior to visit.    BP 120/69 mmHg  Pulse 114  Temp(Src) 98.7 F (37.1 C) (Oral)  Ht  (1.651 m)  Wt 174 lb (78.926 kg)  BMI 28.96 kg/m2  SpO2 96%      Objective:   Physical Exam  General- NAD Skin- left inguinal region. 8 mm small red area that is presently shaved pubic region. Small area of redness and tenderness.        Assessment & Plan:  Pt did not report any vaginal discharge to me.

## 2014-06-10 NOTE — Assessment & Plan Note (Signed)
After skipped depo likley cause. She is not pregnant.

## 2014-06-28 ENCOUNTER — Ambulatory Visit: Payer: Federal, State, Local not specified - PPO | Admitting: Physician Assistant

## 2014-08-10 ENCOUNTER — Telehealth: Payer: Self-pay | Admitting: Family Medicine

## 2014-08-10 NOTE — Telephone Encounter (Signed)
I refilled one month supply but she will need appt with me to get more since it is a controlled substance

## 2014-08-10 NOTE — Telephone Encounter (Signed)
Last refill 05/14/14  #30 with 1 refill Last Office visit 01/07/14 No scheduled upcoming appt.

## 2014-08-11 NOTE — Telephone Encounter (Signed)
Patient called in stating that she spoke to someone this morning telling her that rx was ready for pickup. I do not see anything in chart nor is the prescription upfront. Please assist.

## 2014-08-12 MED ORDER — ALPRAZOLAM 2 MG PO TABS: ORAL_TABLET | ORAL | Status: DC

## 2014-08-12 NOTE — Telephone Encounter (Signed)
[facility] and patient has been informed rx does not need to be picked up can be faxed in.  Also she the patient will call back this afternoon to schedule an appointment with Dr. Abner GreenspanBlyth.

## 2014-08-12 NOTE — Telephone Encounter (Signed)
Reprinted prescription as PCP instructions

## 2014-08-12 NOTE — Telephone Encounter (Signed)
Caller name: Channing MuttersDebra Otero Relationship to patient: self Can be reached: [phone]  Reason for call: Pt states she was called yesterday appox 9:30am that her RX was ready to pick up. She had her mother come to pick up and it was not here. She has called again this morning to see if the RX has been located. It is not in the files up front. Please notify pt when RX is ready.

## 2014-08-12 NOTE — Addendum Note (Signed)
Addended by: Scharlene GlossEWING, Nochum Fenter B on: 08/12/2014 09:58 AM   Modules accepted: Orders

## 2014-09-03 ENCOUNTER — Other Ambulatory Visit: Payer: Self-pay | Admitting: Family Medicine

## 2014-09-03 MED ORDER — LAMOTRIGINE 150 MG PO TABS: 150.0000 mg | ORAL_TABLET | Freq: Every day | ORAL | Status: DC

## 2014-09-03 NOTE — Telephone Encounter (Signed)
Patient calling for refill of lamotrigine 150mg  [facility], complete out and don't have appt until 10/12/14 [phone]

## 2014-09-03 NOTE — Telephone Encounter (Signed)
Patient informed script sent in. 

## 2014-09-09 ENCOUNTER — Other Ambulatory Visit (HOSPITAL_COMMUNITY)
Admission: RE | Admit: 2014-09-09 | Discharge: 2014-09-09 | Disposition: A | Discharge status: Home / Self Care | Payer: Federal, State, Local not specified - PPO | Source: Ambulatory Visit | Attending: Family Medicine | Admitting: Family Medicine

## 2014-09-09 ENCOUNTER — Encounter: Payer: Self-pay | Admitting: Family Medicine

## 2014-09-09 ENCOUNTER — Ambulatory Visit (INDEPENDENT_AMBULATORY_CARE_PROVIDER_SITE_OTHER): Payer: Federal, State, Local not specified - PPO | Admitting: Family Medicine

## 2014-09-09 VITALS — BP 108/60 | HR 82 | Temp 98.6°F | Ht 64.5 in | Wt 182.4 lb

## 2014-09-09 DIAGNOSIS — G43009 Migraine without aura, not intractable, without status migrainosus: Secondary | ICD-10-CM

## 2014-09-09 DIAGNOSIS — M549 Dorsalgia, unspecified: Secondary | ICD-10-CM

## 2014-09-09 DIAGNOSIS — L309 Dermatitis, unspecified: Secondary | ICD-10-CM | POA: Diagnosis not present

## 2014-09-09 DIAGNOSIS — F17210 Nicotine dependence, cigarettes, uncomplicated: Secondary | ICD-10-CM

## 2014-09-09 DIAGNOSIS — N760 Acute vaginitis: Secondary | ICD-10-CM | POA: Insufficient documentation

## 2014-09-09 DIAGNOSIS — F32A Depression, unspecified: Secondary | ICD-10-CM

## 2014-09-09 DIAGNOSIS — Z720 Tobacco use: Secondary | ICD-10-CM | POA: Diagnosis not present

## 2014-09-09 DIAGNOSIS — L509 Urticaria, unspecified: Secondary | ICD-10-CM | POA: Diagnosis not present

## 2014-09-09 DIAGNOSIS — F419 Anxiety disorder, unspecified: Secondary | ICD-10-CM

## 2014-09-09 DIAGNOSIS — F329 Major depressive disorder, single episode, unspecified: Secondary | ICD-10-CM

## 2014-09-09 DIAGNOSIS — L709 Acne, unspecified: Secondary | ICD-10-CM | POA: Diagnosis not present

## 2014-09-09 DIAGNOSIS — F418 Other specified anxiety disorders: Secondary | ICD-10-CM

## 2014-09-09 MED ORDER — ALPRAZOLAM 2 MG PO TABS: ORAL_TABLET | ORAL | Status: DC

## 2014-09-09 MED ORDER — CLOBETASOL PROPIONATE 0.05 % EX CREA: TOPICAL_CREAM | CUTANEOUS | Status: DC

## 2014-09-09 MED ORDER — CETIRIZINE HCL 10 MG PO TABS: 10.0000 mg | ORAL_TABLET | Freq: Two times a day (BID) | ORAL | Status: DC | PRN

## 2014-09-09 MED ORDER — CLINDAMYCIN PHOS-BENZOYL PEROX 1-5 % EX GEL: CUTANEOUS | Status: DC

## 2014-09-09 MED ORDER — RANITIDINE HCL 150 MG PO TABS: 150.0000 mg | ORAL_TABLET | Freq: Two times a day (BID) | ORAL | Status: DC | PRN

## 2014-09-09 NOTE — Progress Notes (Signed)
Pre visit review using our clinic review tool, if applicable. No additional management support is needed unless otherwise documented below in the visit note. 

## 2014-09-09 NOTE — Progress Notes (Signed)
Jillian Reyes  [id number] [birth year] 09/09/2014      Progress Note-Follow Up  Subjective  Chief Complaint  Chief Complaint  Patient presents with  . Medication Refill    HPI  Patient is a 24 y.o. female in today for routine medical care. Patient is in today for follow-up on numerous conditions. She is noting some increased vaginal discharge and irritation. No lesions, new partners, burning. No abdominal or back pain is new. She has some ongoing low back pain but it is tolerable most days. No recent febrile illness. Denies CP/palp/SOB/HA/congestion/fevers/GI or GU c/o. Taking meds as prescribed  Past Medical History  Diagnosis Date  . Bipolar 1 disorder   . Chicken pox as a child  . Diabetes mellitus     gestational   . Back pain, lumbosacral   . Allergic state   . Abnormal cells of cervix   . Contraceptive management 01/06/2012  . Eczema 01/06/2012  . Acne 01/06/2012  . Mid back pain   . Contusion of leg 10/15/2012    right  . Dyslipidemia 02/18/2013  . Encounter for preconception consultation 07/27/2013  . Eczematous dermatitis of eyelid 09/09/2014    History reviewed. No pertinent past surgical history.  Family History  Problem Relation Age of Onset  . Heart disease    . Diabetes Father 40    type 2  . Thyroid disease Father   . Hyperlipidemia      great grandparents  . Migraines Mother   . Kidney disease Mother     History   Social History  . Marital Status: Single    Spouse Name: N/A  . Number of Children: 1  . Years of Education: N/A   Occupational History  . HR specialist     [employer]   Social History Main Topics  . Smoking status: Current Every Day Smoker -- 1.00 packs/day    Types: Cigarettes  . Smokeless tobacco: Never Used     Comment: wants chantix  . Alcohol Use: No  . Drug Use: No  . Sexual Activity:    Partners: Male    Birth Control/ Protection: Injection     Comment: depo shot   Other Topics Concern  . Not on file   Social  History Narrative   Has a son. Living with her mother, stepfather, and brother and sister.     2 caffeinated drinks daily.   On Depo for birth control.     Current Outpatient Prescriptions on File Prior to Visit  Medication Sig Dispense Refill  . albuterol (PROVENTIL HFA;VENTOLIN HFA) 108 (90 BASE) MCG/ACT inhaler Inhale 2 puffs into the lungs every 6 (six) hours as needed for wheezing or shortness of breath. 1 Inhaler 0  . fluticasone (FLONASE) 50 MCG/ACT nasal spray Place 2 sprays into both nostrils daily. 16 g 1  . lamoTRIgine (LAMICTAL) 150 MG tablet Take 1 tablet (150 mg total) by mouth daily. 90 tablet 1  . cyclobenzaprine (FLEXERIL) 10 MG tablet TAKE 1 TABLET (10 MG TOTAL) BY MOUTH AT BEDTIME AS NEEDED FOR MUSCLE SPASMS. (Patient not taking: Reported on 09/09/2014) 30 tablet 0  . SUMAtriptan (IMITREX) 50 MG tablet Take 1 tablet (50 mg total) by mouth once. May repeat in 2 hours if headache persists or recurs. (Patient not taking: Reported on 09/09/2014) 30 tablet 0  . [DISCONTINUED] naproxen (NAPROSYN) 375 MG tablet Take 1 tablet (375 mg total) by mouth 2 (two) times daily with a meal. 360 tablet 0   No  current facility-administered medications on file prior to visit.    No Known Allergies  Review of Systems  Review of Systems  Constitutional: Positive for malaise/fatigue. Negative for fever.  HENT: Negative for congestion.   Eyes: Negative for discharge.  Respiratory: Negative for shortness of breath.   Cardiovascular: Negative for chest pain, palpitations and leg swelling.  Gastrointestinal: Negative for nausea, abdominal pain and diarrhea.  Genitourinary: Negative for dysuria, urgency, frequency and hematuria.  Musculoskeletal: Negative for falls.  Skin: Negative for rash.  Neurological: Negative for loss of consciousness and headaches.  Endo/Heme/Allergies: Negative for polydipsia.  Psychiatric/Behavioral: Negative for depression and suicidal ideas. The patient is  nervous/anxious. The patient does not have insomnia.     Objective  BP 108/60 mmHg  Pulse 82  Temp(Src) 98.6 F (37 C) (Oral)  Ht 5' 4.5" (1.638 m)  Wt 182 lb 6 oz (82.725 kg)  BMI 30.83 kg/m2  SpO2 97%  Physical Exam  Physical Exam  Constitutional: She is oriented to person, place, and time and well-developed, well-nourished, and in no distress. No distress.  HENT:  Head: Normocephalic and atraumatic.  Eyes: Conjunctivae are normal.  Neck: Neck supple. No thyromegaly present.  Cardiovascular: Normal rate, regular rhythm and normal heart sounds.   No murmur heard. Pulmonary/Chest: Effort normal and breath sounds normal. She has no wheezes.  Abdominal: She exhibits no distension and no mass.  Musculoskeletal: She exhibits no edema.  Lymphadenopathy:    She has no cervical adenopathy.  Neurological: She is alert and oriented to person, place, and time.  Skin: Skin is warm and dry. No rash noted. She is not diaphoretic.  Psychiatric: Memory, affect and judgment normal.    Lab Results  Component Value Date   TSH 0.975 02/17/2013   Lab Results  Component Value Date   WBC 8.1 05/12/2014   HGB 15.4* 05/12/2014   HCT 45.4 05/12/2014   MCV 92.5 05/12/2014   PLT 186 05/12/2014   Lab Results  Component Value Date   CREATININE 0.71 05/12/2014   BUN 16 05/12/2014   NA 136 05/12/2014   K 3.5 05/12/2014   CL 111 05/12/2014   CO2 23 05/12/2014   Lab Results  Component Value Date   ALT 21 05/12/2014   AST 16 05/12/2014   ALKPHOS 52 05/12/2014   BILITOT 0.9 05/12/2014   Lab Results  Component Value Date   CHOL 134 02/17/2013   Lab Results  Component Value Date   HDL 36* 02/17/2013   Lab Results  Component Value Date   LDLCALC 68 02/17/2013   Lab Results  Component Value Date   TRIG 149 02/17/2013   Lab Results  Component Value Date   CHOLHDL 3.7 02/17/2013     Assessment & Plan   Vaginitis and vulvovaginitis Testing reveals BV, started on  probiotics and flagyl  Tobacco abuse Encouraged complete cessation. Discussed need to quit as relates to risk of numerous cancers, cardiac and pulmonary disease as well as neurologic complications. Counseled for greater than 3 minutes  Migraine without aura and without status migrainosus, not intractable No recent flare. Encouraged increased hydration, 64 ounces of clear fluids daily. Minimize alcohol and caffeine. Eat small frequent meals with lean proteins and complex carbs. Avoid high and low blood sugars. Get adequate sleep, 7-8 hours a night. Needs exercise daily preferably in the morning.  Mid back pain Encouraged moist heat and gentle stretching as tolerated. May try NSAIDs and prescription meds as directed and report if symptoms worsen  or seek immediate care

## 2014-09-09 NOTE — Patient Instructions (Signed)
Nicotine Addiction Nicotine can act as both a stimulant (excites/activates) and a sedative (calms/quiets). Immediately after exposure to nicotine, there is a "kick" caused in part by the drug's stimulation of the adrenal glands and resulting discharge of adrenaline (epinephrine). The rush of adrenaline stimulates the body and causes a sudden release of sugar. This means that smokers are always slightly hyperglycemic. Hyperglycemic means that the blood sugar is high, just like in diabetics. Nicotine also decreases the amount of insulin which helps control sugar levels in the body. There is an increase in blood pressure, breathing, and the rate of heart beats.  In addition, nicotine indirectly causes a release of dopamine in the brain that controls pleasure and motivation. A similar reaction is seen with other drugs of abuse, such as cocaine and heroin. This dopamine release is thought to cause the pleasurable sensations when smoking. In some different cases, nicotine can also create a calming effect, depending on sensitivity of the smoker's nervous system and the dose of nicotine taken. WHAT HAPPENS WHEN NICOTINE IS TAKEN FOR LONG PERIODS OF TIME?  Long-term use of nicotine results in addiction. It is difficult to stop.  Repeated use of nicotine creates tolerance. Higher doses of nicotine are needed to get the "kick." When nicotine use is stopped, withdrawal may last a month or more. Withdrawal may begin within a few hours after the last cigarette. Symptoms peak within the first few days and may lessen within a few weeks. For some people, however, symptoms may last for months or longer. Withdrawal symptoms include:   Irritability.  Craving.  Learning and attention deficits.  Sleep disturbances.  Increased appetite. Craving for tobacco may last for 6 months or longer. Many behaviors done while using nicotine can also play a part in the severity of withdrawal symptoms. For some people, the feel,  smell, and sight of a cigarette and the ritual of obtaining, handling, lighting, and smoking the cigarette are closely linked with the pleasure of smoking. When stopped, they also miss the related behaviors which make the withdrawal or craving worse. While nicotine gum and patches may lessen the drug aspects of withdrawal, cravings often persist. WHAT ARE THE MEDICAL CONSEQUENCES OF NICOTINE USE?  Nicotine addiction accounts for one-third of all cancers. The top cancer caused by tobacco is lung cancer. Lung cancer is the number one cancer killer of both men and women.  Smoking is also associated with cancers of the:  Mouth.  Pharynx.  Larynx.  Esophagus.  Stomach.  Pancreas.  Cervix.  Kidney.  Ureter.  Bladder.  Smoking also causes lung diseases such as lasting (chronic) bronchitis and emphysema.  It worsens asthma in adults and children.  Smoking increases the risk of heart disease, including:  Stroke.  Heart attack.  Vascular disease.  Aneurysm.  Passive or secondary smoke can also increase medical risks including:  Asthma in children.  Sudden Infant Death Syndrome (SIDS).  Additionally, dropped cigarettes are the leading cause of residential fire fatalities.  Nicotine poisoning has been reported from accidental ingestion of tobacco products by children and pets. Death usually results in a few minutes from respiratory failure (when a person stops breathing) caused by paralysis. TREATMENT   Medication. Nicotine replacement medicines such as nicotine gum and the patch are used to stop smoking. These medicines gradually lower the dosage of nicotine in the body. These medicines do not contain the carbon monoxide and other toxins found in tobacco smoke.  Hypnotherapy.  Relaxation therapy.  Nicotine Anonymous (a 12-step support   program). Find times and locations in your local yellow pages. Document Released: 11/09/2003 Document Revised: 05/28/2011 Document  Reviewed: 05/01/2013 [employer] Patient Information 2015 [employer]. This information is not intended to replace advice given to you by your health care provider. Make sure you discuss any questions you have with your health care provider.  

## 2014-09-10 LAB — COMPREHENSIVE METABOLIC PANEL
ALBUMIN: 4.2 g/dL (ref 3.5–5.2)
ALT: 18 U/L (ref 0–35)
AST: 18 U/L (ref 0–37)
Alkaline Phosphatase: 61 U/L (ref 39–117)
BILIRUBIN TOTAL: 0.4 mg/dL (ref 0.2–1.2)
BUN: 11 mg/dL (ref 6–23)
CALCIUM: 9.8 mg/dL (ref 8.4–10.5)
CO2: 26 meq/L (ref 19–32)
CREATININE: 0.8 mg/dL (ref 0.40–1.20)
Chloride: 106 mEq/L (ref 96–112)
GFR: 92.97 mL/min (ref >60.00)
GLUCOSE: 78 mg/dL (ref 70–99)
Potassium: 4.1 mEq/L (ref 3.5–5.1)
Sodium: 138 mEq/L (ref 135–145)
Total Protein: 7.2 g/dL (ref 6.0–8.3)

## 2014-09-10 LAB — VITAMIN D 25 HYDROXY (VIT D DEFICIENCY, FRACTURES): VITD: 20.73 ng/mL — ABNORMAL LOW (ref 30.00–100.00)

## 2014-09-13 ENCOUNTER — Other Ambulatory Visit: Payer: Self-pay | Admitting: Family Medicine

## 2014-09-13 LAB — URINE CYTOLOGY ANCILLARY ONLY
Bacterial vaginitis: POSITIVE — AB
Candida vaginitis: NEGATIVE

## 2014-09-13 MED ORDER — ERGOCALCIFEROL 1.25 MG (50000 UT) PO CAPS: 50000.0000 [IU] | ORAL_CAPSULE | ORAL | Status: DC

## 2014-09-14 ENCOUNTER — Other Ambulatory Visit: Payer: Self-pay | Admitting: Family Medicine

## 2014-09-14 MED ORDER — METRONIDAZOLE 500 MG PO TABS: 500.0000 mg | ORAL_TABLET | Freq: Two times a day (BID) | ORAL | Status: DC

## 2014-09-22 NOTE — Assessment & Plan Note (Signed)
Encouraged complete cessation. Discussed need to quit as relates to risk of numerous cancers, cardiac and pulmonary disease as well as neurologic complications. Counseled for greater than 3 minutes 

## 2014-09-22 NOTE — Assessment & Plan Note (Signed)
Testing reveals BV, started on probiotics and flagyl

## 2014-09-22 NOTE — Assessment & Plan Note (Signed)
Encouraged moist heat and gentle stretching as tolerated. May try NSAIDs and prescription meds as directed and report if symptoms worsen or seek immediate care 

## 2014-09-22 NOTE — Assessment & Plan Note (Signed)
No recent flare. Encouraged increased hydration, 64 ounces of clear fluids daily. Minimize alcohol and caffeine. Eat small frequent meals with lean proteins and complex carbs. Avoid high and low blood sugars. Get adequate sleep, 7-8 hours a night. Needs exercise daily preferably in the morning. 

## 2014-10-11 ENCOUNTER — Other Ambulatory Visit: Payer: Self-pay | Admitting: Family Medicine

## 2014-10-11 NOTE — Telephone Encounter (Signed)
Patient requesting Alprazolam.   Last refill 09/09/14 LOV 09/09/14 UDS and CSC 01/07/14- moderate risk  Please advise.

## 2014-10-11 NOTE — Telephone Encounter (Signed)
Rx printed, MD signed, faxed to [facility] in [place] per patient preference.

## 2014-10-12 ENCOUNTER — Ambulatory Visit: Payer: Self-pay | Admitting: Family Medicine

## 2014-11-11 ENCOUNTER — Other Ambulatory Visit: Payer: Self-pay | Admitting: Family Medicine

## 2014-11-11 NOTE — Telephone Encounter (Signed)
Requesting:  Alprazolam Contract  Signed on 01/07/14 UDS  01/07/14 Moderate NEXT UDS was due on 04/09/14 Last OV   09/09/14 Last Refill   #30 with 0 refills on 10/11/14  Please Advise

## 2014-11-11 NOTE — Telephone Encounter (Signed)
PCP printed/approved refill of Alprazolam and is on counter for signature.  Once signed will fax hardcopy to [facility].

## 2014-11-15 ENCOUNTER — Ambulatory Visit (INDEPENDENT_AMBULATORY_CARE_PROVIDER_SITE_OTHER): Payer: Federal, State, Local not specified - PPO | Admitting: Medical

## 2014-11-15 ENCOUNTER — Encounter: Payer: Self-pay | Admitting: Medical

## 2014-11-15 ENCOUNTER — Ambulatory Visit (HOSPITAL_BASED_OUTPATIENT_CLINIC_OR_DEPARTMENT_OTHER)
Admission: RE | Admit: 2014-11-15 | Discharge: 2014-11-15 | Disposition: A | Discharge status: Home / Self Care | Payer: Federal, State, Local not specified - PPO | Source: Ambulatory Visit | Attending: Medical | Admitting: Medical

## 2014-11-15 VITALS — BP 110/70 | HR 86 | Temp 98.0°F | Resp 16 | Ht 64.5 in | Wt 186.8 lb

## 2014-11-15 DIAGNOSIS — M25532 Pain in left wrist: Secondary | ICD-10-CM | POA: Diagnosis not present

## 2014-11-15 DIAGNOSIS — M79645 Pain in left finger(s): Secondary | ICD-10-CM

## 2014-11-15 DIAGNOSIS — M25531 Pain in right wrist: Secondary | ICD-10-CM

## 2014-11-15 DIAGNOSIS — M542 Cervicalgia: Secondary | ICD-10-CM | POA: Diagnosis not present

## 2014-11-15 DIAGNOSIS — M546 Pain in thoracic spine: Secondary | ICD-10-CM

## 2014-11-15 LAB — POCT URINE PREGNANCY: PREG TEST UR: NEGATIVE

## 2014-11-15 MED ORDER — DICLOFENAC SODIUM 75 MG PO TBEC: 75.0000 mg | DELAYED_RELEASE_TABLET | Freq: Two times a day (BID) | ORAL | Status: DC

## 2014-11-15 MED ORDER — CYCLOBENZAPRINE HCL 10 MG PO TABS: 10.0000 mg | ORAL_TABLET | Freq: Every day | ORAL | Status: DC

## 2014-11-15 NOTE — Progress Notes (Signed)
Pre visit review using our clinic review tool, if applicable. No additional management support is needed unless otherwise documented below in the visit note. 

## 2014-11-15 NOTE — Patient Instructions (Signed)
Will get xray of the area that are painful.  Rx diclofenac for pain and inflammation. Rx flexeril muscle relaxant.  Follow up xray.   Follow up in 7-10 days any persisting pain or as needed.

## 2014-11-15 NOTE — Addendum Note (Signed)
Addended by: Neldon Labella on: 11/15/2014 04:36 PM   Modules accepted: Orders
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
## 2014-11-18 ENCOUNTER — Ambulatory Visit: Payer: Federal, State, Local not specified - PPO | Admitting: Family Medicine

## 2014-11-18 ENCOUNTER — Telehealth: Payer: Self-pay | Admitting: Family Medicine

## 2014-11-19 ENCOUNTER — Ambulatory Visit: Payer: Federal, State, Local not specified - PPO | Admitting: Physician Assistant

## 2014-11-19 NOTE — Telephone Encounter (Signed)
yes

## 2014-11-19 NOTE — Telephone Encounter (Signed)
Pt was no show 11/18/14, follow up appt for back pain. Pt seen by Ramon Dredge 11/15/14 and to f/u with you 11/24/14. Pt called 11/18/14 and rescheduled for same day and then did not show. Charge for no show?

## 2014-11-24 ENCOUNTER — Ambulatory Visit: Payer: Federal, State, Local not specified - PPO | Admitting: Medical

## 2014-11-25 ENCOUNTER — Ambulatory Visit (INDEPENDENT_AMBULATORY_CARE_PROVIDER_SITE_OTHER): Payer: Federal, State, Local not specified - PPO | Admitting: Internal Medicine

## 2014-11-25 ENCOUNTER — Encounter: Payer: Self-pay | Admitting: Internal Medicine

## 2014-11-25 DIAGNOSIS — M542 Cervicalgia: Secondary | ICD-10-CM | POA: Diagnosis not present

## 2014-11-25 DIAGNOSIS — M546 Pain in thoracic spine: Secondary | ICD-10-CM | POA: Diagnosis not present

## 2014-11-25 MED ORDER — PREDNISONE 10 MG PO TABS: ORAL_TABLET | ORAL | Status: DC
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
## 2014-11-25 NOTE — Progress Notes (Signed)
Pre visit review using our clinic review tool, if applicable. No additional management support is needed unless otherwise documented below in the visit note. 

## 2014-11-25 NOTE — Patient Instructions (Signed)
Take prednisone as prescribed  Continue with Flexeril and Voltaren as needed  Will refer you to the orthopedic doctor

## 2014-12-01 ENCOUNTER — Telehealth: Payer: Self-pay | Admitting: Family Medicine

## 2014-12-01 DIAGNOSIS — M542 Cervicalgia: Secondary | ICD-10-CM

## 2014-12-01 DIAGNOSIS — M546 Pain in thoracic spine: Secondary | ICD-10-CM

## 2014-12-01 NOTE — Telephone Encounter (Signed)
Referral replaced to Dr. Regino Schultze at [facility].

## 2014-12-01 NOTE — Telephone Encounter (Signed)
Pt states we referred her to [place] [facility] and she does not want to go there. She wants to go to the place she went before in 2013. [facility] REQUEST[facility], PH [phone], FAX [phone] Please f/u as needed.

## 2014-12-08 ENCOUNTER — Other Ambulatory Visit: Payer: Self-pay | Admitting: Family Medicine

## 2014-12-08 MED ORDER — ALPRAZOLAM 2 MG PO TABS: ORAL_TABLET | ORAL | Status: DC

## 2014-12-08 NOTE — Telephone Encounter (Signed)
Pt calling for refill on alprazolam. She has enough til 12/12/14.  [facility] with concerns [phone]

## 2014-12-08 NOTE — Telephone Encounter (Signed)
LOV 6/23 Contract signed 01/07/14  alprazolam (XANAX) 2 MG tablet  30 tablet 0 ordered  TAKE ONE TABLET BY MOUTH AT BEDTIME AS NEEDED FOR SLEEP OR ANXIETY  Please advise

## 2014-12-12 ENCOUNTER — Other Ambulatory Visit: Payer: Self-pay | Admitting: Family Medicine

## 2014-12-13 NOTE — Telephone Encounter (Signed)
Faxed hardcopy for [facility]am to [facility] [place] 

## 2015-01-11 ENCOUNTER — Other Ambulatory Visit: Payer: Self-pay | Admitting: Family Medicine

## 2015-01-11 MED ORDER — ALPRAZOLAM 2 MG PO TABS: ORAL_TABLET | ORAL | Status: DC

## 2015-01-11 NOTE — Telephone Encounter (Signed)
Faxed hardcopy for [facility]am to [facility] [place] 

## 2015-01-11 NOTE — Telephone Encounter (Signed)
[facility]  Reason for call: Pt needing refill on alprazolam . Has 1 left for tonight.

## 2015-01-11 NOTE — Telephone Encounter (Signed)
Patient informed [facility] (called left a detailed message)

## 2015-01-11 NOTE — Telephone Encounter (Signed)
Requesting: Alprazolam Contract  Signed 01/07/14 UDS  DUE NOW last moderate Last OV   09/09/14 Last Refill   #30 with 0 refills on 12/12/14   Advise Alprazolam refill

## 2015-01-12 ENCOUNTER — Encounter: Payer: Self-pay | Admitting: Family

## 2015-01-12 ENCOUNTER — Ambulatory Visit (INDEPENDENT_AMBULATORY_CARE_PROVIDER_SITE_OTHER): Payer: Federal, State, Local not specified - PPO | Admitting: Family

## 2015-01-12 ENCOUNTER — Encounter (INDEPENDENT_AMBULATORY_CARE_PROVIDER_SITE_OTHER): Payer: Self-pay

## 2015-01-12 VITALS — BP 105/67 | HR 80 | Temp 98.0°F | Resp 16 | Ht 65.0 in | Wt 190.0 lb

## 2015-01-12 DIAGNOSIS — R635 Abnormal weight gain: Secondary | ICD-10-CM

## 2015-01-12 DIAGNOSIS — S161XXA Strain of muscle, fascia and tendon at neck level, initial encounter: Secondary | ICD-10-CM

## 2015-01-12 DIAGNOSIS — N912 Amenorrhea, unspecified: Secondary | ICD-10-CM | POA: Diagnosis not present

## 2015-01-12 LAB — CBC WITH DIFFERENTIAL/PLATELET
Basophils Absolute: 0 10*3/uL (ref 0.0–0.1)
Basophils Relative: 0.3 % (ref 0.0–3.0)
EOS PCT: 1.2 % (ref 0.0–5.0)
Eosinophils Absolute: 0.1 10*3/uL (ref 0.0–0.7)
HCT: 45.5 % (ref 36.0–46.0)
Hemoglobin: 15.1 g/dL — ABNORMAL HIGH (ref 12.0–15.0)
LYMPHS ABS: 2.4 10*3/uL (ref 0.7–4.0)
Lymphocytes Relative: 25.6 % (ref 12.0–46.0)
MCHC: 33.2 g/dL (ref 30.0–36.0)
MCV: 91.4 fl (ref 78.0–100.0)
MONOS PCT: 6.7 % (ref 3.0–12.0)
Monocytes Absolute: 0.6 10*3/uL (ref 0.1–1.0)
NEUTROS ABS: 6.1 10*3/uL (ref 1.4–7.7)
NEUTROS PCT: 66.2 % (ref 43.0–77.0)
PLATELETS: 254 10*3/uL (ref 150.0–400.0)
RBC: 4.98 Mil/uL (ref 3.87–5.11)
RDW: 13.2 % (ref 11.5–15.5)
WBC: 9.2 10*3/uL (ref 4.0–10.5)

## 2015-01-12 LAB — BASIC METABOLIC PANEL
BUN: 10 mg/dL (ref 6–23)
CALCIUM: 9.4 mg/dL (ref 8.4–10.5)
CO2: 26 meq/L (ref 19–32)
CREATININE: 0.82 mg/dL (ref 0.40–1.20)
Chloride: 105 mEq/L (ref 96–112)
GFR: 90.11 mL/min (ref >60.00)
Glucose, Bld: 90 mg/dL (ref 70–99)
Potassium: 3.9 mEq/L (ref 3.5–5.1)
Sodium: 139 mEq/L (ref 135–145)

## 2015-01-12 LAB — T4, FREE: FREE T4: 0.93 ng/dL (ref 0.60–1.60)

## 2015-01-12 LAB — TSH: TSH: 1.01 u[IU]/mL (ref 0.35–4.50)

## 2015-01-12 LAB — T3, FREE: T3 FREE: 3.8 pg/mL (ref 2.3–4.2)

## 2015-01-12 MED ORDER — MELOXICAM 7.5 MG PO TABS: 7.5000 mg | ORAL_TABLET | Freq: Every day | ORAL | Status: DC

## 2015-01-12 MED ORDER — CYCLOBENZAPRINE HCL 5 MG PO TABS: 5.0000 mg | ORAL_TABLET | Freq: Every evening | ORAL | Status: DC | PRN

## 2015-01-12 NOTE — Patient Instructions (Signed)
Please complete lab work prior to leaving. Start meloxicam once daily (anti-inflammatory) and flexeril (muscle relaxer) at bedtime as needed for neck pain/muscle spasm. Call if neck pain worsens or does not improve in 2 weeks.

## 2015-01-12 NOTE — Progress Notes (Signed)
Pre visit review using our clinic review tool, if applicable. No additional management support is needed unless otherwise documented below in the visit note. 
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
## 2015-01-13 ENCOUNTER — Encounter: Payer: Self-pay | Admitting: Family

## 2015-01-13 LAB — PREGNANCY, URINE: PREG TEST UR: NEGATIVE

## 2015-01-14 ENCOUNTER — Telehealth: Payer: Self-pay | Admitting: Family Medicine

## 2015-01-14 NOTE — Telephone Encounter (Signed)
Notified pt per 01/13/15 mychart message and she voices understanding. States that she is stressed but feels it is everyday life, nothing out of the ordinary and doesn't feel depressed. She was advised to f/u with PCP if symptoms continue or worsen.

## 2015-01-14 NOTE — Telephone Encounter (Signed)
Relation to RU:EAVWpt:self Call back number: [phone]    Reason for call:  Patient inquiring about lab results and can not access MyChart due to phone complication. Please advise

## 2015-01-26 ENCOUNTER — Encounter: Payer: Self-pay | Admitting: Family Medicine

## 2015-01-26 ENCOUNTER — Ambulatory Visit (INDEPENDENT_AMBULATORY_CARE_PROVIDER_SITE_OTHER): Payer: Federal, State, Local not specified - PPO | Admitting: Family Medicine

## 2015-01-26 VITALS — BP 120/65 | HR 100 | Ht 65.0 in | Wt 182.0 lb

## 2015-01-26 DIAGNOSIS — N911 Secondary amenorrhea: Secondary | ICD-10-CM

## 2015-01-26 DIAGNOSIS — L0231 Cutaneous abscess of buttock: Secondary | ICD-10-CM

## 2015-01-26 NOTE — Progress Notes (Addendum)
Subjective:    Patient ID: Jillian Reyes, female    DOB: 01/20/1991, 24 y.o.   MRN: [id number]  HPI Patient seen for consult on secondary amenorrhea.  She is a 24yo G2P1011, who delivered a term baby via SVD without complications.   Following the delivery of her child, she was on depo for 7-8 years with her last depo shot in Nov 2015.  Saw OB/Gyn in [place] in February for a groin abscess that was I&D and was given a couple of months of OCPs.  She discontinued the OCPs in April, due to side effects and has been off hormonal contraception since that time.  She did have some spotting for a couple of day while on OCPs during placebo week. She saw her PCP for this at the end of October.  TSH and thyroid hormones were normal, as was her BMP and CBC.  She denies weight loss, dieting, extreme exercise, instrumentation of her uterus, galactorrhea, hirsutism, systemic illnesses, hot flashes.  She has had a weight gain of approximately 25 pounds over the past year.  Menstrual history: Had irregular periods prior to pregnancy.  Will have a "normal period" for a couple months, but then will either skip a menses or have light spotting.  Normal menses bleeding for 4-5 days, a couple of days.    Additionally, patient has "boil" on left buttock that started 2-3 days ago.  Initially, thought it was a bug bite, but became bigger yesterday morning and is hot to the touch.  No palliating or provoking factors.  Has not tried anything to improve  I have reviewed the patients past medical, family, and social history.  I have reviewed the patient's medication list and allergies.  Review of Systems  Constitutional: Negative for fever, chills and fatigue.  Eyes: Negative for visual disturbance.  Gastrointestinal: Negative for nausea, vomiting, abdominal pain, diarrhea and constipation.  Endocrine: Negative for cold intolerance and heat intolerance.  Genitourinary: Negative for dysuria, vaginal discharge, vaginal pain and  pelvic pain.  Neurological: Positive for headaches (has migraines, no change in frequency or nature). Negative for weakness.  All other systems reviewed and are negative.     Objective:   Physical Exam  Constitutional: She is oriented to person, place, and time. She appears well-developed and well-nourished.  HENT:  Head: Normocephalic and atraumatic.  Right Ear: External ear normal.  Left Ear: External ear normal.  Eyes: EOM are normal. Pupils are equal, round, and reactive to light. Right eye exhibits no discharge. Left eye exhibits no discharge. No scleral icterus.  Neck: Normal range of motion. Neck supple. No tracheal deviation present. No thyromegaly present.  Cardiovascular: Normal rate, regular rhythm and normal heart sounds.  Exam reveals no gallop and no friction rub.   No murmur heard. Pulmonary/Chest: Effort normal and breath sounds normal.  Abdominal: Soft. Bowel sounds are normal. She exhibits no distension and no mass. There is no tenderness. There is no rebound and no guarding.  Musculoskeletal: Normal range of motion. She exhibits no edema or tenderness.  Neurological: She is alert and oriented to person, place, and time. No cranial nerve deficit. Coordination normal.  Skin: Skin is warm and dry. No rash noted. No erythema. No pallor.  3cm firm, erythemic, indurated area about mid-gluteal cleft  Psychiatric: She has a normal mood and affect. Her behavior is normal. Judgment and thought content normal.      Assessment & Plan:  1. Secondary amenorrhea Patient about 7-8 months since last  exogenous hormones, which possibly could still be having effects on her hypothalamic-pituitary Axis.  Will check FSH, prolactin, estradiol.  As her TSH, thyroid hormone levels, and pregnancy test were normal a week ago, these do not need to be repeated.  - Follicle stimulating hormone - Prolactin - Estradiol  2. Abscess of buttock, left As there is no fluctuance, not able to I&D.   Recommended warm compresses.  If does not improve over the next 5 days, or if worsens, she should see her PCP or return here for further management.

## 2015-01-27 LAB — ESTRADIOL: Estradiol: 47.3 pg/mL

## 2015-01-27 LAB — FOLLICLE STIMULATING HORMONE: FSH: 6.2 m[IU]/mL

## 2015-01-27 LAB — PROLACTIN: PROLACTIN: 6.1 ng/mL

## 2015-01-31 ENCOUNTER — Encounter: Payer: Self-pay | Admitting: Family Medicine

## 2015-02-01 NOTE — Telephone Encounter (Signed)
Patient called and left message about wanting to know about her labs.  Returned call to patient and left message for her to return call to office. Armandina StammerJennifer Howard RN BSN

## 2015-02-03 NOTE — Telephone Encounter (Signed)
Left message for patient informing her she could get on my chart for results or call back to the office. Armandina StammerJennifer Marieli Rudy RN BSN

## 2015-02-04 NOTE — Telephone Encounter (Signed)
Called patient and she had been able to get on my chart and see the results of her lab work but now states that she thinks her boyfriend cheated on her. Patient states she had an odor after intercourse and would like to be checked. Patient given appointment. Armandina StammerJennifer Lanson Randle RN BSN

## 2015-02-07 ENCOUNTER — Telehealth: Payer: Self-pay | Admitting: Family Medicine

## 2015-02-07 NOTE — Telephone Encounter (Signed)
Relation to XB:MWUXpt:self Call back number:[phone]   Reason for call:  Patient requesting a refill alprazolam (XANAX) 2 MG tablet

## 2015-02-07 NOTE — Telephone Encounter (Signed)
Can have a refill on the Alprazolam same strength, same sig, #30 with 1 rf

## 2015-02-07 NOTE — Telephone Encounter (Signed)
Requesting: Alprazolam Contract  01/09/2015 UDS   Moderate Last OV  09/09/2014 Last Refill   #30 with 1 refill 01/11/2015  Please Advise

## 2015-02-08 MED ORDER — ALPRAZOLAM 2 MG PO TABS: ORAL_TABLET | ORAL | Status: DC

## 2015-02-08 NOTE — Telephone Encounter (Signed)
[facility]ed as instructed[facility]. Patient informed done.

## 2015-02-09 ENCOUNTER — Encounter: Payer: Self-pay | Admitting: Obstetrics & Gynecology

## 2015-02-09 ENCOUNTER — Ambulatory Visit (INDEPENDENT_AMBULATORY_CARE_PROVIDER_SITE_OTHER): Payer: Federal, State, Local not specified - PPO | Admitting: Obstetrics & Gynecology

## 2015-02-09 VITALS — BP 103/75 | HR 96 | Ht 65.0 in | Wt 182.0 lb

## 2015-02-09 DIAGNOSIS — N898 Other specified noninflammatory disorders of vagina: Secondary | ICD-10-CM | POA: Diagnosis not present

## 2015-02-09 DIAGNOSIS — Z113 Encounter for screening for infections with a predominantly sexual mode of transmission: Secondary | ICD-10-CM

## 2015-02-09 MED ORDER — METRONIDAZOLE 500 MG PO TABS: 500.0000 mg | ORAL_TABLET | Freq: Two times a day (BID) | ORAL | Status: DC

## 2015-02-09 NOTE — Progress Notes (Signed)
   Subjective:    Patient ID: Jillian Reyes, female    DOB: 02/19/1991, 24 y.o.   MRN: [id number]  HPI  24 yo SW 18P1 70(25 yo son) here because her boyfriend cheated on her for the last few months. He is now out of her life. She would like STI testing. She also has a vaginal discharge that she believes is a return of her BV.   Review of Systems She does not want a pregnancy now but is not using contraception. She does not want any "hormones" at this time.    Objective:   Physical Exam [id number] Breathing, conversing, and ambulating normally Abd- benign EG, vagina- normal Discharge frothy and white       Assessment & Plan:  STI check per patient request She declines a flu vaccine today Contaception- We discussed barrier methods including the sponge Vaginal discharge c/w BV- flagyl given with refills Wet prep sent

## 2015-02-10 LAB — HEPATITIS C ANTIBODY: HCV AB: NEGATIVE

## 2015-02-10 LAB — HIV ANTIBODY (ROUTINE TESTING W REFLEX): HIV: NONREACTIVE

## 2015-02-10 LAB — WET PREP BY MOLECULAR PROBE
Candida species: NEGATIVE
Gardnerella vaginalis: POSITIVE — AB
Trichomonas vaginosis: NEGATIVE

## 2015-02-10 LAB — HEPATITIS B SURFACE ANTIGEN: HEP B S AG: NEGATIVE

## 2015-02-10 LAB — RPR

## 2015-02-15 LAB — GC/CHLAMYDIA PROBE AMP (~~LOC~~) NOT AT ARMC
CHLAMYDIA, DNA PROBE: NEGATIVE
Neisseria Gonorrhea: NEGATIVE

## 2015-02-16 ENCOUNTER — Telehealth: Payer: Self-pay

## 2015-02-16 NOTE — Telephone Encounter (Signed)
Patient called and would like results from 02-09-15 StD screening. Patient made aware that results are negative with the exception of BV but she was already treated for this at last appointment.   Patient states she did have a small red bump come up on her labia. Patient states it could be ingrown hair. Patient advised to try warm compresses and if that doesn't help in next few days to call back for an appointment. Patient states understanding. Armandina StammerJennifer Howard RN BSN

## 2015-03-03 ENCOUNTER — Other Ambulatory Visit: Payer: Self-pay | Admitting: Family Medicine

## 2015-03-03 NOTE — Telephone Encounter (Signed)
Caller name: Jillian Reyes  Relationship to patient: Self   Can be reached: [phone]  Ph[facility]acy: [id number] [place], [place] - [address]  Reason for call: Pt says that she is going out of town until January and would like to have her Alprazolam Rx filled to take with her. Pt says that she dont want to run out while away. Pt says that pharmacy informed her that PCP has to approve.

## 2015-03-03 NOTE — Telephone Encounter (Signed)
Requesting: Alprazolam Contract  01/07/2014 UDS   moderate Last OV   09/09/2014 Last Refill   #30 with 1 refills on 02/08/2015  Please Advise

## 2015-03-04 MED ORDER — ALPRAZOLAM 2 MG PO TABS: ORAL_TABLET | ORAL | Status: DC

## 2015-03-04 NOTE — Telephone Encounter (Signed)
F[facility] and patient informed.

## 2015-03-07 ENCOUNTER — Telehealth: Payer: Self-pay | Admitting: Family Medicine

## 2015-03-07 NOTE — Telephone Encounter (Signed)
I feel like I already approved this but if rx was not completed yes she can have it early as requested

## 2015-03-07 NOTE — Telephone Encounter (Signed)
Caller name: Self   Can be reached: [phone]  Pharmacy:  [id number] [place], [place] - [address] [phone] (Phone) [phone])         Reason for call: Request that her alprazolam [name](XANAX) 2 MG tablet [[id number]][[id number]] be filled 3 days early. States that she made the request last week but the approval for it to be filled early was not sent. States she is leaving town tomorrow for 2 weeks.

## 2015-03-07 NOTE — Telephone Encounter (Signed)
Called pharmacist to ok early refill.  Patient informed.

## 2015-03-07 NOTE — Telephone Encounter (Signed)
Patient is going out of town for a couple weeks and needs refill done early.  [facility] and they will fill it.

## 2015-03-10 ENCOUNTER — Ambulatory Visit (INDEPENDENT_AMBULATORY_CARE_PROVIDER_SITE_OTHER): Payer: Self-pay

## 2015-03-10 ENCOUNTER — Ambulatory Visit (INDEPENDENT_AMBULATORY_CARE_PROVIDER_SITE_OTHER): Payer: Federal, State, Local not specified - PPO | Admitting: Sports Medicine

## 2015-03-10 VITALS — BP 113/71 | HR 91 | Temp 98.0°F | Resp 18 | Wt 185.3 lb

## 2015-03-10 DIAGNOSIS — M5412 Radiculopathy, cervical region: Secondary | ICD-10-CM

## 2015-03-10 DIAGNOSIS — M62838 Other muscle spasm: Secondary | ICD-10-CM | POA: Insufficient documentation

## 2015-03-10 MED ORDER — CYCLOBENZAPRINE HCL 10 MG PO TABS: ORAL_TABLET | ORAL | Status: DC

## 2015-03-10 MED ORDER — PREDNISONE 50 MG PO TABS: ORAL_TABLET | ORAL | Status: DC

## 2015-03-10 MED ORDER — DIAZEPAM 5 MG PO TABS: ORAL_TABLET | ORAL | Status: DC
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
## 2015-03-10 NOTE — Addendum Note (Signed)
Addended by: Monica BectonHEKKEKANDAM, THOMAS J on: 03/10/2015 05:14 PM   Modules accepted: Orders

## 2015-03-11 ENCOUNTER — Other Ambulatory Visit: Payer: Self-pay | Admitting: Physician Assistant

## 2015-03-11 ENCOUNTER — Telehealth: Payer: Self-pay

## 2015-03-11 DIAGNOSIS — R7989 Other specified abnormal findings of blood chemistry: Secondary | ICD-10-CM

## 2015-03-11 NOTE — Telephone Encounter (Signed)
Patient advised.

## 2015-03-11 NOTE — Telephone Encounter (Signed)
I have already written a prescription for Valium

## 2015-03-11 NOTE — Telephone Encounter (Signed)
Patient would like to have medication before the MRI. She is very claustrophobic. Please advise.

## 2015-03-22 ENCOUNTER — Ambulatory Visit (INDEPENDENT_AMBULATORY_CARE_PROVIDER_SITE_OTHER): Payer: Self-pay

## 2015-03-22 DIAGNOSIS — M5412 Radiculopathy, cervical region: Secondary | ICD-10-CM

## 2015-03-25 ENCOUNTER — Ambulatory Visit: Payer: Federal, State, Local not specified - PPO | Admitting: Sports Medicine

## 2015-03-25 ENCOUNTER — Ambulatory Visit (INDEPENDENT_AMBULATORY_CARE_PROVIDER_SITE_OTHER): Payer: Self-pay | Admitting: Sports Medicine

## 2015-03-25 ENCOUNTER — Encounter: Payer: Self-pay | Admitting: Sports Medicine

## 2015-03-25 VITALS — BP 120/75 | HR 114 | Temp 98.6°F | Resp 18 | Wt 186.0 lb

## 2015-03-25 DIAGNOSIS — F319 Bipolar disorder, unspecified: Secondary | ICD-10-CM

## 2015-03-25 DIAGNOSIS — M5412 Radiculopathy, cervical region: Secondary | ICD-10-CM

## 2015-03-25 MED ORDER — DULOXETINE HCL 30 MG PO CPEP: 30.0000 mg | ORAL_CAPSULE | Freq: Every day | ORAL | Status: DC

## 2015-03-25 NOTE — Assessment & Plan Note (Signed)
History of bipolar disorder on Lamictal, currently depressed. It will be impossible to fully control her neck pain until we can control her depression, I'm going to add low-dose Cymbalta, we do need serotonin and norepinephrine reuptake inhibition. We will keep a close eye out for emergence of mania.

## 2015-03-25 NOTE — Assessment & Plan Note (Signed)
Cervical spine MRI is negative and symptoms are likely related to depression and myofascial pain syndrome. Continue with aggressive formal physical therapy, or also going to add low-dose Cymbalta, she will come back to see me in 4 weeks to recheck PHQ9, GAD7, and neck pain.
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
## 2015-03-28 ENCOUNTER — Ambulatory Visit (INDEPENDENT_AMBULATORY_CARE_PROVIDER_SITE_OTHER): Payer: Federal, State, Local not specified - PPO | Admitting: Sports Medicine

## 2015-03-28 DIAGNOSIS — M6249 Contracture of muscle, multiple sites: Secondary | ICD-10-CM | POA: Diagnosis not present

## 2015-03-28 DIAGNOSIS — M62838 Other muscle spasm: Secondary | ICD-10-CM

## 2015-03-28 MED ORDER — TRAMADOL HCL 50 MG PO TABS: ORAL_TABLET | ORAL | Status: DC

## 2015-03-28 NOTE — Progress Notes (Signed)
  Subjective:    CC: follow-up  HPI: Stanton KidneyDebra returns, she not unexpectedly has persistent pain in the left trapezius, we started her on Cymbalta for current depressive phase bipolar 1 disorder, she continues her mood stabilizer, she is only been on this for 3 days now. She has noted some improvement with Flexeril, but still has some pain. Has not yet started physical therapy.  Past medical history, Surgical history, Family history not pertinant except as noted below, Social history, Allergies, and medications have been entered into the medical record, reviewed, and no changes needed.   Review of Systems: No fevers, chills, night sweats, weight loss, chest pain, or shortness of breath.   Objective:    General: Well Developed, well nourished, and in no acute distress.  Neuro: Alert and oriented x3, extra-ocular muscles intact, sensation grossly intact.  HEENT: Normocephalic, atraumatic, pupils equal round reactive to light, neck supple, no masses, no lymphadenopathy, thyroid nonpalpable.  Skin: Warm and dry, no rashes. Cardiac: Regular rate and rhythm, no murmurs rubs or gallops, no lower extremity edema.  Respiratory: Clear to auscultation bilaterally. Not using accessory muscles, speaking in full sentences. Neck: Negative spurling's Full neck range of motion Grip strength and sensation normal in bilateral hands Strength good C4 to T1 distribution No sensory change to C4 to T1 Reflexes normal Tender to palpation over the left trapezius.  Impression and Recommendations:   I spent 25 minutes with this patient, greater than 50% was face-to-face time counseling regarding the above diagnoses

## 2015-03-28 NOTE — Assessment & Plan Note (Signed)
Again, with a negative cervical spine MRI symptoms are likely related to myofascial pain syndrome and depression. She has only had a few days of Cymbalta, continue Flexeril which seems to help somewhat, and I'm going to add tramadol for breakthrough pain. She is also not yet started physical therapy, this will help her significantly.

## 2015-03-29 ENCOUNTER — Ambulatory Visit: Payer: Federal, State, Local not specified - PPO | Attending: Sports Medicine | Admitting: Physical Therapy

## 2015-03-29 DIAGNOSIS — M542 Cervicalgia: Secondary | ICD-10-CM | POA: Insufficient documentation

## 2015-03-29 DIAGNOSIS — M5010 Cervical disc disorder with radiculopathy, unspecified cervical region: Secondary | ICD-10-CM | POA: Insufficient documentation

## 2015-03-29 DIAGNOSIS — M6248 Contracture of muscle, other site: Secondary | ICD-10-CM | POA: Insufficient documentation

## 2015-03-29 DIAGNOSIS — G44221 Chronic tension-type headache, intractable: Secondary | ICD-10-CM | POA: Insufficient documentation

## 2015-03-30 ENCOUNTER — Telehealth: Payer: Self-pay | Admitting: Behavioral Health

## 2015-03-30 NOTE — Telephone Encounter (Signed)
Patient returning your call and would like you to call back after 4:3pm

## 2015-03-30 NOTE — Telephone Encounter (Signed)
Unable to reach patient at time of Pre-Visit Call.  Left message for patient to return call when available.    

## 2015-03-31 ENCOUNTER — Encounter: Payer: Self-pay | Admitting: Family Medicine

## 2015-03-31 ENCOUNTER — Encounter: Payer: Self-pay | Admitting: Behavioral Health

## 2015-03-31 ENCOUNTER — Other Ambulatory Visit: Payer: Self-pay | Admitting: Family Medicine

## 2015-03-31 ENCOUNTER — Ambulatory Visit (INDEPENDENT_AMBULATORY_CARE_PROVIDER_SITE_OTHER): Payer: Federal, State, Local not specified - PPO | Admitting: Family Medicine

## 2015-03-31 VITALS — BP 110/78 | HR 98 | Temp 98.2°F | Ht 65.0 in | Wt 186.4 lb

## 2015-03-31 DIAGNOSIS — F319 Bipolar disorder, unspecified: Secondary | ICD-10-CM

## 2015-03-31 DIAGNOSIS — M6249 Contracture of muscle, multiple sites: Secondary | ICD-10-CM | POA: Diagnosis not present

## 2015-03-31 DIAGNOSIS — E559 Vitamin D deficiency, unspecified: Secondary | ICD-10-CM

## 2015-03-31 DIAGNOSIS — Z8632 Personal history of gestational diabetes: Secondary | ICD-10-CM

## 2015-03-31 DIAGNOSIS — F32A Depression, unspecified: Secondary | ICD-10-CM

## 2015-03-31 DIAGNOSIS — Z0000 Encounter for general adult medical examination without abnormal findings: Secondary | ICD-10-CM | POA: Diagnosis not present

## 2015-03-31 DIAGNOSIS — L709 Acne, unspecified: Secondary | ICD-10-CM

## 2015-03-31 DIAGNOSIS — Z720 Tobacco use: Secondary | ICD-10-CM | POA: Diagnosis not present

## 2015-03-31 DIAGNOSIS — R000 Tachycardia, unspecified: Secondary | ICD-10-CM | POA: Diagnosis not present

## 2015-03-31 DIAGNOSIS — F419 Anxiety disorder, unspecified: Secondary | ICD-10-CM

## 2015-03-31 DIAGNOSIS — E785 Hyperlipidemia, unspecified: Secondary | ICD-10-CM

## 2015-03-31 DIAGNOSIS — L509 Urticaria, unspecified: Secondary | ICD-10-CM | POA: Diagnosis not present

## 2015-03-31 DIAGNOSIS — F418 Other specified anxiety disorders: Secondary | ICD-10-CM

## 2015-03-31 DIAGNOSIS — M62838 Other muscle spasm: Secondary | ICD-10-CM

## 2015-03-31 DIAGNOSIS — F329 Major depressive disorder, single episode, unspecified: Secondary | ICD-10-CM

## 2015-03-31 LAB — MICROALBUMIN / CREATININE URINE RATIO
Creatinine,U: 360.6 mg/dL
MICROALB UR: 0.8 mg/dL (ref 0.0–1.9)
MICROALB/CREAT RATIO: 0.2 mg/g (ref 0.0–30.0)

## 2015-03-31 LAB — COMPREHENSIVE METABOLIC PANEL
ALBUMIN: 4.3 g/dL (ref 3.5–5.2)
ALK PHOS: 60 U/L (ref 39–117)
ALT: 25 U/L (ref 0–35)
AST: 22 U/L (ref 0–37)
BUN: 10 mg/dL (ref 6–23)
CO2: 26 mEq/L (ref 19–32)
CREATININE: 0.84 mg/dL (ref 0.40–1.20)
Calcium: 9.3 mg/dL (ref 8.4–10.5)
Chloride: 103 mEq/L (ref 96–112)
GFR: 87.49 mL/min (ref >60.00)
GLUCOSE: 87 mg/dL (ref 70–99)
POTASSIUM: 3.7 meq/L (ref 3.5–5.1)
SODIUM: 137 meq/L (ref 135–145)
TOTAL PROTEIN: 6.8 g/dL (ref 6.0–8.3)
Total Bilirubin: 0.6 mg/dL (ref 0.2–1.2)

## 2015-03-31 LAB — CBC
HEMATOCRIT: 45 % (ref 36.0–46.0)
Hemoglobin: 15.1 g/dL — ABNORMAL HIGH (ref 12.0–15.0)
MCHC: 33.5 g/dL (ref 30.0–36.0)
MCV: 91.3 fl (ref 78.0–100.0)
PLATELETS: 220 10*3/uL (ref 150.0–400.0)
RBC: 4.93 Mil/uL (ref 3.87–5.11)
RDW: 12.8 % (ref 11.5–15.5)
WBC: 7.6 10*3/uL (ref 4.0–10.5)

## 2015-03-31 LAB — VITAMIN D 25 HYDROXY (VIT D DEFICIENCY, FRACTURES): VITD: 17.38 ng/mL — AB (ref 30.00–100.00)

## 2015-03-31 LAB — LIPID PANEL
CHOLESTEROL: 152 mg/dL (ref 0–200)
HDL: 40.8 mg/dL (ref >39.00)
LDL Cholesterol: 87 mg/dL (ref 0–99)
NONHDL: 111.15
Total CHOL/HDL Ratio: 4
Triglycerides: 120 mg/dL (ref 0.0–149.0)
VLDL: 24 mg/dL (ref 0.0–40.0)

## 2015-03-31 LAB — TSH: TSH: 0.79 u[IU]/mL (ref 0.35–4.50)

## 2015-03-31 MED ORDER — ALPRAZOLAM 2 MG PO TABS: ORAL_TABLET | ORAL | Status: DC

## 2015-03-31 MED ORDER — NORETHIN ACE-ETH ESTRAD-FE 1-20 MG-MCG(24) PO TABS: 1.0000 | ORAL_TABLET | Freq: Every day | ORAL | Status: DC

## 2015-03-31 MED ORDER — METHOCARBAMOL 500 MG PO TABS: 500.0000 mg | ORAL_TABLET | Freq: Two times a day (BID) | ORAL | Status: DC | PRN

## 2015-03-31 MED ORDER — CLOBETASOL PROPIONATE 0.05 % EX CREA: TOPICAL_CREAM | CUTANEOUS | Status: DC

## 2015-03-31 MED ORDER — VITAMIN D (ERGOCALCIFEROL) 1.25 MG (50000 UNIT) PO CAPS: 50000.0000 [IU] | ORAL_CAPSULE | ORAL | Status: DC

## 2015-03-31 NOTE — Telephone Encounter (Signed)
Pre-Visit Call completed with patient and chart updated.   Pre-Visit Info documented in Specialty Comments under [employer].    

## 2015-03-31 NOTE — Assessment & Plan Note (Addendum)
Has been struggling but follows with psychiatry, no changes today

## 2015-03-31 NOTE — Addendum Note (Signed)
Addended by: Melanee SpryBYRD, Allyiah Gartner E on: 03/31/2015 08:48 AM   Modules accepted: Medications

## 2015-03-31 NOTE — Telephone Encounter (Signed)
Prescription strength vitamin D sent in to the pharmacy.
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
## 2015-03-31 NOTE — Assessment & Plan Note (Signed)
Has cut down considerably but is encouraged to proceed with complete cessation. Encouraged complete cessation. Discussed need to quit as relates to risk of numerous cancers, cardiac and pulmonary disease as well as neurologic complications. Counseled for greater than 3 minutes

## 2015-03-31 NOTE — Progress Notes (Signed)
Pre visit review using our clinic review tool, if applicable. No additional management support is needed unless otherwise documented below in the visit note. 

## 2015-03-31 NOTE — Patient Instructions (Signed)
Preventive Care for Adults, Female A healthy lifestyle and preventive care can promote health and wellness. Preventive health guidelines for women include the following key practices.  A routine yearly physical is a good way to check with your health care provider about your health and preventive screening. It is a chance to share any concerns and updates on your health and to receive a thorough exam.  Visit your dentist for a routine exam and preventive care every 6 months. Brush your teeth twice a day and floss once a day. Good oral hygiene prevents tooth decay and gum disease.  The frequency of eye exams is based on your age, health, family medical history, use of contact lenses, and other factors. Follow your health care provider's recommendations for frequency of eye exams.  Eat a healthy diet. Foods like vegetables, fruits, whole grains, low-fat dairy products, and lean protein foods contain the nutrients you need without too many calories. Decrease your intake of foods high in solid fats, added sugars, and salt. Eat the right amount of calories for you.Get information about a proper diet from your health care provider, if necessary.  Regular physical exercise is one of the most important things you can do for your health. Most adults should get at least 150 minutes of moderate-intensity exercise (any activity that increases your heart rate and causes you to sweat) each week. In addition, most adults need muscle-strengthening exercises on 2 or more days a week.  Maintain a healthy weight. The body mass index (BMI) is a screening tool to identify possible weight problems. It provides an estimate of body fat based on height and weight. Your health care provider can find your BMI and can help you achieve or maintain a healthy weight.For adults 20 years and older:  A BMI below 18.5 is considered underweight.  A BMI of 18.5 to 24.9 is normal.  A BMI of 25 to 29.9 is considered overweight.  A  BMI of 30 and above is considered obese.  Maintain normal blood lipids and cholesterol levels by exercising and minimizing your intake of saturated fat. Eat a balanced diet with plenty of fruit and vegetables. Blood tests for lipids and cholesterol should begin at age 45 and be repeated every 5 years. If your lipid or cholesterol levels are high, you are over 50, or you are at high risk for heart disease, you may need your cholesterol levels checked more frequently.Ongoing high lipid and cholesterol levels should be treated with medicines if diet and exercise are not working.  If you smoke, find out from your health care provider how to quit. If you do not use tobacco, do not start.  Lung cancer screening is recommended for adults aged 45-80 years who are at high risk for developing lung cancer because of a history of smoking. A yearly low-dose CT scan of the lungs is recommended for people who have at least a 30-pack-year history of smoking and are a current smoker or have quit within the past 15 years. A pack year of smoking is smoking an average of 1 pack of cigarettes a day for 1 year (for example: 1 pack a day for 30 years or 2 packs a day for 15 years). Yearly screening should continue until the smoker has stopped smoking for at least 15 years. Yearly screening should be stopped for people who develop a health problem that would prevent them from having lung cancer treatment.  If you are pregnant, do not drink alcohol. If you are  breastfeeding, be very cautious about drinking alcohol. If you are not pregnant and choose to drink alcohol, do not have more than 1 drink per day. One drink is considered to be 12 ounces (355 mL) of beer, 5 ounces (148 mL) of wine, or 1.5 ounces (44 mL) of liquor.  Avoid use of street drugs. Do not share needles with anyone. Ask for help if you need support or instructions about stopping the use of drugs.  High blood pressure causes heart disease and increases the risk  of stroke. Your blood pressure should be checked at least every 1 to 2 years. Ongoing high blood pressure should be treated with medicines if weight loss and exercise do not work.  If you are 55-79 years old, ask your health care provider if you should take aspirin to prevent strokes.  Diabetes screening is done by taking a blood sample to check your blood glucose level after you have not eaten for a certain period of time (fasting). If you are not overweight and you do not have risk factors for diabetes, you should be screened once every 3 years starting at age 45. If you are overweight or obese and you are 40-70 years of age, you should be screened for diabetes every year as part of your cardiovascular risk assessment.  Breast cancer screening is essential preventive care for women. You should practice "breast self-awareness." This means understanding the normal appearance and feel of your breasts and may include breast self-examination. Any changes detected, no matter how small, should be reported to a health care provider. Women in their 20s and 30s should have a clinical breast exam (CBE) by a health care provider as part of a regular health exam every 1 to 3 years. After age 40, women should have a CBE every year. Starting at age 40, women should consider having a mammogram (breast X-ray test) every year. Women who have a family history of breast cancer should talk to their health care provider about genetic screening. Women at a high risk of breast cancer should talk to their health care providers about having an MRI and a mammogram every year.  Breast cancer gene (BRCA)-related cancer risk assessment is recommended for women who have family members with BRCA-related cancers. BRCA-related cancers include breast, ovarian, tubal, and peritoneal cancers. Having family members with these cancers may be associated with an increased risk for harmful changes (mutations) in the breast cancer genes BRCA1 and  BRCA2. Results of the assessment will determine the need for genetic counseling and BRCA1 and BRCA2 testing.  Your health care provider may recommend that you be screened regularly for cancer of the pelvic organs (ovaries, uterus, and vagina). This screening involves a pelvic examination, including checking for microscopic changes to the surface of your cervix (Pap test). You may be encouraged to have this screening done every 3 years, beginning at age 21.  For women ages 30-65, health care providers may recommend pelvic exams and Pap testing every 3 years, or they may recommend the Pap and pelvic exam, combined with testing for human papilloma virus (HPV), every 5 years. Some types of HPV increase your risk of cervical cancer. Testing for HPV may also be done on women of any age with unclear Pap test results.  Other health care providers may not recommend any screening for nonpregnant women who are considered low risk for pelvic cancer and who do not have symptoms. Ask your health care provider if a screening pelvic exam is right for   you.  If you have had past treatment for cervical cancer or a condition that could lead to cancer, you need Pap tests and screening for cancer for at least 20 years after your treatment. If Pap tests have been discontinued, your risk factors (such as having a new sexual partner) need to be reassessed to determine if screening should resume. Some women have medical problems that increase the chance of getting cervical cancer. In these cases, your health care provider may recommend more frequent screening and Pap tests.  Colorectal cancer can be detected and often prevented. Most routine colorectal cancer screening begins at the age of 50 years and continues through age 75 years. However, your health care provider may recommend screening at an earlier age if you have risk factors for colon cancer. On a yearly basis, your health care provider may provide home test kits to check  for hidden blood in the stool. Use of a small camera at the end of a tube, to directly examine the colon (sigmoidoscopy or colonoscopy), can detect the earliest forms of colorectal cancer. Talk to your health care provider about this at age 50, when routine screening begins. Direct exam of the colon should be repeated every 5-10 years through age 75 years, unless early forms of precancerous polyps or small growths are found.  People who are at an increased risk for hepatitis B should be screened for this virus. You are considered at high risk for hepatitis B if:  You were born in a country where hepatitis B occurs often. Talk with your health care provider about which countries are considered high risk.  Your parents were born in a high-risk country and you have not received a shot to protect against hepatitis B (hepatitis B vaccine).  You have HIV or AIDS.  You use needles to inject street drugs.  You live with, or have sex with, someone who has hepatitis B.  You get hemodialysis treatment.  You take certain medicines for conditions like cancer, organ transplantation, and autoimmune conditions.  Hepatitis C blood testing is recommended for all people born from 1945 through 1965 and any individual with known risks for hepatitis C.  Practice safe sex. Use condoms and avoid high-risk sexual practices to reduce the spread of sexually transmitted infections (STIs). STIs include gonorrhea, chlamydia, syphilis, trichomonas, herpes, HPV, and human immunodeficiency virus (HIV). Herpes, HIV, and HPV are viral illnesses that have no cure. They can result in disability, cancer, and death.  You should be screened for sexually transmitted illnesses (STIs) including gonorrhea and chlamydia if:  You are sexually active and are younger than 24 years.  You are older than 24 years and your health care provider tells you that you are at risk for this type of infection.  Your sexual activity has changed  since you were last screened and you are at an increased risk for chlamydia or gonorrhea. Ask your health care provider if you are at risk.  If you are at risk of being infected with HIV, it is recommended that you take a prescription medicine daily to prevent HIV infection. This is called preexposure prophylaxis (PrEP). You are considered at risk if:  You are sexually active and do not regularly use condoms or know the HIV status of your partner(s).  You take drugs by injection.  You are sexually active with a partner who has HIV.  Talk with your health care provider about whether you are at high risk of being infected with HIV. If   you choose to begin PrEP, you should first be tested for HIV. You should then be tested every 3 months for as long as you are taking PrEP.  Osteoporosis is a disease in which the bones lose minerals and strength with aging. This can result in serious bone fractures or breaks. The risk of osteoporosis can be identified using a bone density scan. Women ages 67 years and over and women at risk for fractures or osteoporosis should discuss screening with their health care providers. Ask your health care provider whether you should take a calcium supplement or vitamin D to reduce the rate of osteoporosis.  Menopause can be associated with physical symptoms and risks. Hormone replacement therapy is available to decrease symptoms and risks. You should talk to your health care provider about whether hormone replacement therapy is right for you.  Use sunscreen. Apply sunscreen liberally and repeatedly throughout the day. You should seek shade when your shadow is shorter than you. Protect yourself by wearing long sleeves, pants, a wide-brimmed hat, and sunglasses year round, whenever you are outdoors.  Once a month, do a whole body skin exam, using a mirror to look at the skin on your back. Tell your health care provider of new moles, moles that have irregular borders, moles that  are larger than a pencil eraser, or moles that have changed in shape or color.  Stay current with required vaccines (immunizations).  Influenza vaccine. All adults should be immunized every year.  Tetanus, diphtheria, and acellular pertussis (Td, Tdap) vaccine. Pregnant women should receive 1 dose of Tdap vaccine during each pregnancy. The dose should be obtained regardless of the length of time since the last dose. Immunization is preferred during the 27th-36th week of gestation. An adult who has not previously received Tdap or who does not know her vaccine status should receive 1 dose of Tdap. This initial dose should be followed by tetanus and diphtheria toxoids (Td) booster doses every 10 years. Adults with an unknown or incomplete history of completing a 3-dose immunization series with Td-containing vaccines should begin or complete a primary immunization series including a Tdap dose. Adults should receive a Td booster every 10 years.  Varicella vaccine. An adult without evidence of immunity to varicella should receive 2 doses or a second dose if she has previously received 1 dose. Pregnant females who do not have evidence of immunity should receive the first dose after pregnancy. This first dose should be obtained before leaving the health care facility. The second dose should be obtained 4-8 weeks after the first dose.  Human papillomavirus (HPV) vaccine. Females aged 13-26 years who have not received the vaccine previously should obtain the 3-dose series. The vaccine is not recommended for use in pregnant females. However, pregnancy testing is not needed before receiving a dose. If a female is found to be pregnant after receiving a dose, no treatment is needed. In that case, the remaining doses should be delayed until after the pregnancy. Immunization is recommended for any person with an immunocompromised condition through the age of 61 years if she did not get any or all doses earlier. During the  3-dose series, the second dose should be obtained 4-8 weeks after the first dose. The third dose should be obtained 24 weeks after the first dose and 16 weeks after the second dose.  Zoster vaccine. One dose is recommended for adults aged 30 years or older unless certain conditions are present.  Measles, mumps, and rubella (MMR) vaccine. Adults born  before 1957 generally are considered immune to measles and mumps. Adults born in 1957 or later should have 1 or more doses of MMR vaccine unless there is a contraindication to the vaccine or there is laboratory evidence of immunity to each of the three diseases. A routine second dose of MMR vaccine should be obtained at least 28 days after the first dose for students attending postsecondary schools, health care workers, or international travelers. People who received inactivated measles vaccine or an unknown type of measles vaccine during 1963-1967 should receive 2 doses of MMR vaccine. People who received inactivated mumps vaccine or an unknown type of mumps vaccine before 1979 and are at high risk for mumps infection should consider immunization with 2 doses of MMR vaccine. For females of childbearing age, rubella immunity should be determined. If there is no evidence of immunity, females who are not pregnant should be vaccinated. If there is no evidence of immunity, females who are pregnant should delay immunization until after pregnancy. Unvaccinated health care workers born before 1957 who lack laboratory evidence of measles, mumps, or rubella immunity or laboratory confirmation of disease should consider measles and mumps immunization with 2 doses of MMR vaccine or rubella immunization with 1 dose of MMR vaccine.  Pneumococcal 13-valent conjugate (PCV13) vaccine. When indicated, a person who is uncertain of his immunization history and has no record of immunization should receive the PCV13 vaccine. All adults 65 years of age and older should receive this  vaccine. An adult aged 19 years or older who has certain medical conditions and has not been previously immunized should receive 1 dose of PCV13 vaccine. This PCV13 should be followed with a dose of pneumococcal polysaccharide (PPSV23) vaccine. Adults who are at high risk for pneumococcal disease should obtain the PPSV23 vaccine at least 8 weeks after the dose of PCV13 vaccine. Adults older than 26 years of age who have normal immune system function should obtain the PPSV23 vaccine dose at least 1 year after the dose of PCV13 vaccine.  Pneumococcal polysaccharide (PPSV23) vaccine. When PCV13 is also indicated, PCV13 should be obtained first. All adults aged 65 years and older should be immunized. An adult younger than age 65 years who has certain medical conditions should be immunized. Any person who resides in a nursing home or long-term care facility should be immunized. An adult smoker should be immunized. People with an immunocompromised condition and certain other conditions should receive both PCV13 and PPSV23 vaccines. People with human immunodeficiency virus (HIV) infection should be immunized as soon as possible after diagnosis. Immunization during chemotherapy or radiation therapy should be avoided. Routine use of PPSV23 vaccine is not recommended for American Indians, [place] Natives, or people younger than 65 years unless there are medical conditions that require PPSV23 vaccine. When indicated, people who have unknown immunization and have no record of immunization should receive PPSV23 vaccine. One-time revaccination 5 years after the first dose of PPSV23 is recommended for people aged 19-64 years who have chronic kidney failure, nephrotic syndrome, asplenia, or immunocompromised conditions. People who received 1-2 doses of PPSV23 before age 65 years should receive another dose of PPSV23 vaccine at age 65 years or later if at least 5 years have passed since the previous dose. Doses of PPSV23 are not  needed for people immunized with PPSV23 at or after age 65 years.  Meningococcal vaccine. Adults with asplenia or persistent complement component deficiencies should receive 2 doses of quadrivalent meningococcal conjugate (MenACWY-D) vaccine. The doses should be obtained   at least 2 months apart. Microbiologists working with certain meningococcal bacteria, [place] recruits, people at risk during an outbreak, and people who travel to or live in countries with a high rate of meningitis should be immunized. A first-year college student up through age 34 years who is living in a residence hall should receive a dose if she did not receive a dose on or after her 16th birthday. Adults who have certain high-risk conditions should receive one or more doses of vaccine.  Hepatitis A vaccine. Adults who wish to be protected from this disease, have certain high-risk conditions, work with hepatitis A-infected animals, work in hepatitis A research labs, or travel to or work in countries with a high rate of hepatitis A should be immunized. Adults who were previously unvaccinated and who anticipate close contact with an international adoptee during the first 60 days after arrival in the [place] from a country with a high rate of hepatitis A should be immunized.  Hepatitis B vaccine. Adults who wish to be protected from this disease, have certain high-risk conditions, may be exposed to blood or other infectious body fluids, are household contacts or sex partners of hepatitis B positive people, are clients or workers in certain care facilities, or travel to or work in countries with a high rate of hepatitis B should be immunized.  Haemophilus influenzae type b (Hib) vaccine. A previously unvaccinated person with asplenia or sickle cell disease or having a scheduled splenectomy should receive 1 dose of Hib vaccine. Regardless of previous immunization, a recipient of a hematopoietic stem cell transplant should receive a  3-dose series 6-12 months after her successful transplant. Hib vaccine is not recommended for adults with HIV infection. Preventive Services / Frequency Ages 35 to 4 years  Blood pressure check.** / Every 3-5 years.  Lipid and cholesterol check.** / Every 5 years beginning at age 60.  Clinical breast exam.** / Every 3 years for women in their 71s and 10s.  BRCA-related cancer risk assessment.** / For women who have family members with a BRCA-related cancer (breast, ovarian, tubal, or peritoneal cancers).  Pap test.** / Every 2 years from ages 76 through 26. Every 3 years starting at age 61 through age 76 or 93 with a history of 3 consecutive normal Pap tests.  HPV screening.** / Every 3 years from ages 37 through ages 60 to 51 with a history of 3 consecutive normal Pap tests.  Hepatitis C blood test.** / For any individual with known risks for hepatitis C.  Skin self-exam. / Monthly.  Influenza vaccine. / Every year.  Tetanus, diphtheria, and acellular pertussis (Tdap, Td) vaccine.** / Consult your health care provider. Pregnant women should receive 1 dose of Tdap vaccine during each pregnancy. 1 dose of Td every 10 years.  Varicella vaccine.** / Consult your health care provider. Pregnant females who do not have evidence of immunity should receive the first dose after pregnancy.  HPV vaccine. / 3 doses over 6 months, if 93 and younger. The vaccine is not recommended for use in pregnant females. However, pregnancy testing is not needed before receiving a dose.  Measles, mumps, rubella (MMR) vaccine.** / You need at least 1 dose of MMR if you were born in 1957 or later. You may also need a 2nd dose. For females of childbearing age, rubella immunity should be determined. If there is no evidence of immunity, females who are not pregnant should be vaccinated. If there is no evidence of immunity, females who are  pregnant should delay immunization until after pregnancy.  Pneumococcal  13-valent conjugate (PCV13) vaccine.** / Consult your health care provider.  Pneumococcal polysaccharide (PPSV23) vaccine.** / 1 to 2 doses if you smoke cigarettes or if you have certain conditions.  Meningococcal vaccine.** / 1 dose if you are age 68 to 8 years and a Market researcher living in a residence hall, or have one of several medical conditions, you need to get vaccinated against meningococcal disease. You may also need additional booster doses.  Hepatitis A vaccine.** / Consult your health care provider.  Hepatitis B vaccine.** / Consult your health care provider.  Haemophilus influenzae type b (Hib) vaccine.** / Consult your health care provider. Ages 7 to 53 years  Blood pressure check.** / Every year.  Lipid and cholesterol check.** / Every 5 years beginning at age 25 years.  Lung cancer screening. / Every year if you are aged 11-80 years and have a 30-pack-year history of smoking and currently smoke or have quit within the past 15 years. Yearly screening is stopped once you have quit smoking for at least 15 years or develop a health problem that would prevent you from having lung cancer treatment.  Clinical breast exam.** / Every year after age 48 years.  BRCA-related cancer risk assessment.** / For women who have family members with a BRCA-related cancer (breast, ovarian, tubal, or peritoneal cancers).  Mammogram.** / Every year beginning at age 41 years and continuing for as long as you are in good health. Consult with your health care provider.  Pap test.** / Every 3 years starting at age 65 years through age 37 or 70 years with a history of 3 consecutive normal Pap tests.  HPV screening.** / Every 3 years from ages 72 years through ages 60 to 40 years with a history of 3 consecutive normal Pap tests.  Fecal occult blood test (FOBT) of stool. / Every year beginning at age 21 years and continuing until age 5 years. You may not need to do this test if you get  a colonoscopy every 10 years.  Flexible sigmoidoscopy or colonoscopy.** / Every 5 years for a flexible sigmoidoscopy or every 10 years for a colonoscopy beginning at age 35 years and continuing until age 48 years.  Hepatitis C blood test.** / For all people born from [birth year] through 1965 and any individual with known risks for hepatitis C.  Skin self-exam. / Monthly.  Influenza vaccine. / Every year.  Tetanus, diphtheria, and acellular pertussis (Tdap/Td) vaccine.** / Consult your health care provider. Pregnant women should receive 1 dose of Tdap vaccine during each pregnancy. 1 dose of Td every 10 years.  Varicella vaccine.** / Consult your health care provider. Pregnant females who do not have evidence of immunity should receive the first dose after pregnancy.  Zoster vaccine.** / 1 dose for adults aged 30 years or older.  Measles, mumps, rubella (MMR) vaccine.** / You need at least 1 dose of MMR if you were born in 1957 or later. You may also need a second dose. For females of childbearing age, rubella immunity should be determined. If there is no evidence of immunity, females who are not pregnant should be vaccinated. If there is no evidence of immunity, females who are pregnant should delay immunization until after pregnancy.  Pneumococcal 13-valent conjugate (PCV13) vaccine.** / Consult your health care provider.  Pneumococcal polysaccharide (PPSV23) vaccine.** / 1 to 2 doses if you smoke cigarettes or if you have certain conditions.  Meningococcal vaccine.** /  Consult your health care provider.  Hepatitis A vaccine.** / Consult your health care provider.  Hepatitis B vaccine.** / Consult your health care provider.  Haemophilus influenzae type b (Hib) vaccine.** / Consult your health care provider. Ages 64 years and over  Blood pressure check.** / Every year.  Lipid and cholesterol check.** / Every 5 years beginning at age 23 years.  Lung cancer screening. / Every year if you  are aged 16-80 years and have a 30-pack-year history of smoking and currently smoke or have quit within the past 15 years. Yearly screening is stopped once you have quit smoking for at least 15 years or develop a health problem that would prevent you from having lung cancer treatment.  Clinical breast exam.** / Every year after age 74 years.  BRCA-related cancer risk assessment.** / For women who have family members with a BRCA-related cancer (breast, ovarian, tubal, or peritoneal cancers).  Mammogram.** / Every year beginning at age 44 years and continuing for as long as you are in good health. Consult with your health care provider.  Pap test.** / Every 3 years starting at age 58 years through age 22 or 39 years with 3 consecutive normal Pap tests. Testing can be stopped between 65 and 70 years with 3 consecutive normal Pap tests and no abnormal Pap or HPV tests in the past 10 years.  HPV screening.** / Every 3 years from ages 64 years through ages 70 or 61 years with a history of 3 consecutive normal Pap tests. Testing can be stopped between 65 and 70 years with 3 consecutive normal Pap tests and no abnormal Pap or HPV tests in the past 10 years.  Fecal occult blood test (FOBT) of stool. / Every year beginning at age 40 years and continuing until age 27 years. You may not need to do this test if you get a colonoscopy every 10 years.  Flexible sigmoidoscopy or colonoscopy.** / Every 5 years for a flexible sigmoidoscopy or every 10 years for a colonoscopy beginning at age 7 years and continuing until age 32 years.  Hepatitis C blood test.** / For all people born from [birth year] through 1966 and any individual with known risks for hepatitis C.  Osteoporosis screening.** / A one-time screening for women ages 30 years and over and women at risk for fractures or osteoporosis.  Skin self-exam. / Monthly.  Influenza vaccine. / Every year.  Tetanus, diphtheria, and acellular pertussis (Tdap/Td)  vaccine.** / 1 dose of Td every 10 years.  Varicella vaccine.** / Consult your health care provider.  Zoster vaccine.** / 1 dose for adults aged 35 years or older.  Pneumococcal 13-valent conjugate (PCV13) vaccine.** / Consult your health care provider.  Pneumococcal polysaccharide (PPSV23) vaccine.** / 1 dose for all adults aged 46 years and older.  Meningococcal vaccine.** / Consult your health care provider.  Hepatitis A vaccine.** / Consult your health care provider.  Hepatitis B vaccine.** / Consult your health care provider.  Haemophilus influenzae type b (Hib) vaccine.** / Consult your health care provider. ** Family history and personal history of risk and conditions may change your health care provider's recommendations.   This information is not intended to replace advice given to you by your health care provider. Make sure you discuss any questions you have with your health care provider.   Document Released: 05/01/2001 Document Revised: 03/26/2014 Document Reviewed: 07/31/2010 Elsevier Interactive Patient Education [employer].

## 2015-04-06 ENCOUNTER — Ambulatory Visit: Payer: Federal, State, Local not specified - PPO

## 2015-04-06 DIAGNOSIS — M542 Cervicalgia: Secondary | ICD-10-CM | POA: Diagnosis not present

## 2015-04-06 DIAGNOSIS — G44221 Chronic tension-type headache, intractable: Secondary | ICD-10-CM | POA: Diagnosis present

## 2015-04-06 DIAGNOSIS — M5010 Cervical disc disorder with radiculopathy, unspecified cervical region: Secondary | ICD-10-CM

## 2015-04-06 DIAGNOSIS — M62838 Other muscle spasm: Secondary | ICD-10-CM

## 2015-04-06 DIAGNOSIS — M6248 Contracture of muscle, other site: Secondary | ICD-10-CM | POA: Diagnosis present

## 2015-04-06 NOTE — Patient Instructions (Signed)
PERFORM ALL EXERCISES GENTLY AND WITH GOOD POSTURE.    20 SECOND HOLD, 3 REPS TO EACH SIDE. 4-5 TIMES EACH DAY.   AROM: Neck Rotation- to the Right only   Turn head slowly to look over one shoulder, then the other.   AROM: Neck Flexion   Bend head forward.   AROM: Lateral Neck Flexion- to the Right only   Slowly tilt head toward one shoulder, then the other.    Posture - Standing   Good posture is important. Avoid slouching and forward head thrust. Maintain curve in low back and align ears over shoulders, hips over ankles.  Pull your belly button in toward your back bone. Posture Tips DO: - stand tall and erect - keep chin tucked in - keep head and shoulders in alignment - check posture regularly in mirror or large window - pull head back against headrest in car seat;  Change your position often.  Sit with lumbar support. DON'T: - slouch or slump while watching TV or reading - sit, stand or lie in one position  for too long;  Sitting is especially hard on the spine so if you sit at a desk/use the computer, then stand up often! Copyright  VHI. All rights reserved.  Posture - Sitting  Sit upright, head facing forward. Try using a roll to support lower back. Keep shoulders relaxed, and avoid rounded back. Keep hips level with knees. Avoid crossing legs for long periods. Copyright  VHI. All rights reserved.  Chronic neck strain can develop because of poor posture and faulty work habits  Postural strain related to slumped sitting and forward head posture is a leading cause of headaches, neck and upper back pain  General strengthening and flexibility exercises are helpful in the treatment of neck pain.  Most importantly, you should learn to correct the posture that may be contributing to chronic pain.   Change positions frequently  Change your work or home environment to improve posture and mechanics.   Suboccipital Stretch (Supine)    With small towel roll at base of  skull and upper neck. Gently tuck chin until stretch is felt at base of skull and upper neck. Hold _5___ seconds. Relax. Repeat __10__ times per set. Do __1__ sets per session.  Do as needed for headache.  http://orth.exer.us/985   Copyright  VHI. All rights reserved.   [facility] [address], [address] [place], [place] [address] Phone # [phone] Fax [phone]
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
## 2015-04-07 ENCOUNTER — Ambulatory Visit: Payer: Federal, State, Local not specified - PPO | Admitting: Sports Medicine

## 2015-04-10 ENCOUNTER — Encounter: Payer: Self-pay | Admitting: Family Medicine

## 2015-04-10 DIAGNOSIS — Z0000 Encounter for general adult medical examination without abnormal findings: Secondary | ICD-10-CM | POA: Insufficient documentation

## 2015-04-10 DIAGNOSIS — E559 Vitamin D deficiency, unspecified: Secondary | ICD-10-CM

## 2015-04-10 HISTORY — DX: Vitamin D deficiency, unspecified: E55.9

## 2015-04-10 NOTE — Assessment & Plan Note (Signed)
Encouraged heart healthy diet, increase exercise, avoid trans fats, consider a krill oil cap daily 

## 2015-04-10 NOTE — Assessment & Plan Note (Addendum)
Encouraged moist heat and gentle stretching as tolerated. May try NSAIDs and prescription meds as directed and report if symptoms worsen or seek immediate care. Encouraged topical treatments. Robaxin prn is prescribed

## 2015-04-10 NOTE — Assessment & Plan Note (Signed)
Labs reveal deficiency. Start on Vitamin D 50000 IU caps, 1 cap po weekly x 12 weeks. Disp #4 with 4 rf. Also take daily Vitamin D over the counter. If already taking a daily supplement increase by 1000 IU daily and if not start Vitamin D 2000 IU daily.  

## 2015-04-10 NOTE — Assessment & Plan Note (Signed)
Patient encouraged to maintain heart healthy diet, regular exercise, adequate sleep. Consider daily probiotics. Take medications as prescribed 

## 2015-04-11 ENCOUNTER — Ambulatory Visit: Payer: Federal, State, Local not specified - PPO | Admitting: Physical Therapy

## 2015-04-11 ENCOUNTER — Encounter: Payer: Self-pay | Admitting: Physical Therapy

## 2015-04-11 DIAGNOSIS — M62838 Other muscle spasm: Secondary | ICD-10-CM

## 2015-04-11 DIAGNOSIS — M542 Cervicalgia: Secondary | ICD-10-CM | POA: Diagnosis not present

## 2015-04-11 DIAGNOSIS — M5010 Cervical disc disorder with radiculopathy, unspecified cervical region: Secondary | ICD-10-CM

## 2015-04-11 DIAGNOSIS — G44221 Chronic tension-type headache, intractable: Secondary | ICD-10-CM
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
## 2015-04-15 ENCOUNTER — Ambulatory Visit: Payer: Federal, State, Local not specified - PPO | Admitting: Physical Therapy

## 2015-04-15 DIAGNOSIS — G44221 Chronic tension-type headache, intractable: Secondary | ICD-10-CM

## 2015-04-15 DIAGNOSIS — M5010 Cervical disc disorder with radiculopathy, unspecified cervical region: Secondary | ICD-10-CM

## 2015-04-15 DIAGNOSIS — M62838 Other muscle spasm: Secondary | ICD-10-CM

## 2015-04-15 DIAGNOSIS — M542 Cervicalgia: Secondary | ICD-10-CM | POA: Diagnosis not present

## 2015-04-15 NOTE — Therapy (Signed)
[facility] [address], [address] [place], [place], [address] Phone: [phone]   Fax:  [phone]  Physical Therapy Treatment  Patient Details  Name: Jillian Reyes MRN: [id number] Date of Birth: [birth year] Referring Provider: Rodney Langton, MD  Encounter Date: 04/15/2015      PT End of Session - 04/15/15 1154    Visit Number 3   Date for PT Re-Evaluation 06/01/15   PT Start Time 0800   PT Stop Time 0852   PT Time Calculation (min) 52 min   Activity Tolerance Patient tolerated treatment well      Past Medical History  Diagnosis Date  . Bipolar 1 disorder (HCC)   . Chicken pox as a child  . Diabetes mellitus     gestational   . Back pain, lumbosacral   . Allergic state   . Abnormal cells of cervix   . Contraceptive management 01/06/2012  . Eczema 01/06/2012  . Acne 01/06/2012  . Mid back pain   . Contusion of leg 10/15/2012    right  . Dyslipidemia 02/18/2013  . Encounter for preconception consultation 07/27/2013  . Eczematous dermatitis of eyelid 09/09/2014  . Preventative health care 04/10/2015  . Vitamin D deficiency 04/10/2015    No past surgical history on file.  There were no vitals filed for this visit.  Visit Diagnosis:  Neck pain, musculoskeletal  Cervical disc disorder with radiculopathy of cervical region  Muscle spasms of head or neck  Chronic tension-type headache, intractable      Subjective Assessment - 04/15/15 0804    Subjective worked 16 hours yesterday.  Sore left upper trap.  History of left UE finger numbness since fx few years ago.  Headaches every day or every other day.     Currently in Pain? Yes   Pain Score 3    Pain Orientation Left   Pain Descriptors / Indicators Sore                         [facility] Adult PT Treatment/Exercise - 04/15/15 0001    Neck Exercises: Seated   Neck Retraction 5 reps   Neck Exercises: Supine   Neck Retraction 10 reps    Capital Flexion 10 reps   Modalities   Modalities Moist Heat   Moist Heat Therapy   Number Minutes Moist Heat 10 Minutes   Moist Heat Location Cervical   Manual Therapy   Manual Therapy Myofascial release;Manual Traction;Muscle Energy Technique   Soft tissue mobilization left upper trap, levator, suboccipitals   Manual Traction 3x 30 sec   Muscle Energy Technique contract-relax left upper trap   Neck Exercises: Stretches   Upper Trapezius Stretch 3 reps;20 seconds          Trigger Point Dry Needling - 04/15/15 0844    Consent Given? Yes;  Handout on dry needling aftercare provided   Muscles Treated Upper Body Upper trapezius;Suboccipitals muscle group;Levator scapulae   Upper Trapezius Response Twitch reponse elicited;Palpable increased muscle length   SubOccipitals Response Palpable increased muscle length   Levator Scapulae Response Twitch response elicited;Palpable increased muscle length      Left only.          PT Short Term Goals - 04/15/15 1202    PT SHORT TERM GOAL #1   Title be independent in initial HEP   Status Achieved   PT SHORT TERM GOAL #2   Title report a 40% reduction in the frequency and  intensity of daily headaches   Time 4   Period Weeks   Status On-going   PT SHORT TERM GOAL #3   Title report < or = to 5/10 Lt neck pain with work activities   Time 4   Period Weeks   Status On-going           PT Long Term Goals - 04/15/15 1202    PT LONG TERM GOAL #1   Title be independent in advanced HEP   Time 8   Period Weeks   Status On-going   PT LONG TERM GOAL #2   Title reduce FOTO to < or = to 38% limitation   Time 8   Period Weeks   Status On-going   PT LONG TERM GOAL #3   Title report 60% reduction in frequency and intensity of daily headaches   Time 8   Period Weeks   Status On-going   PT LONG TERM GOAL #4   Title report < or = to 3/10 Lt neck and arm pain with work tasks   Time 8   Period Weeks   Status On-going                Plan - 04/15/15 1155    Clinical Impression Statement The patient has multiple tender points in upper trap, cervical paraspinals and suboccipitals.  Improved muscle length following treatment session.  Discussion on postural considerations to decrease forward head/protrusion which may be contributing to myofascial issues.  Therapist closely monitoring response to all.     PT Next Visit Plan Assess response to 1st DN, patient has appt conflict next visit and will resched;  cervical and thoracic mobs;  add supine scapular strengthening;  e-stim/heat as needed          Problem List Patient Active Problem List   Diagnosis Date Noted  . Preventative health care 04/10/2015  . Vitamin D deficiency 04/10/2015  . Cervical paraspinal muscle spasm 03/10/2015  . Dysuria 01/10/2014  . Allergic rhinitis 11/30/2013  . Migraine without aura and without status migrainosus, not intractable 11/17/2013  . Encounter for preconception consultation 07/27/2013  . Insomnia 07/02/2013  . Tendinopathy of rotator cuff 03/08/2013  . Dyslipidemia 02/18/2013  . Contraceptive management 01/06/2012  . Eczema 01/06/2012  . Acne 01/06/2012  . Hx of gestational diabetes mellitus, not currently pregnant   . Mid back pain   . Allergic state   . Abnormal cells of cervix   . Anxiety as acute reaction to exceptional stress 05/01/2011  . Bipolar 1 disorder (HCC) 07/05/2010  . Tobacco abuse 07/05/2010   Lavinia Sharps, PT 04/15/2015 12:05 PM Phone: [phone] Fax: [phone] Vivien Presto 04/15/2015, 12:04 PM  [facility] [address] [place], [place], [address] Phone: [phone]   Fax:  [phone]  Name: LAQUEENA HINCHEY MRN: [id number] Date of Birth: 1990-05-11

## 2015-04-15 NOTE — Patient Instructions (Signed)

## 2015-04-18 ENCOUNTER — Ambulatory Visit: Payer: Federal, State, Local not specified - PPO

## 2015-04-18 DIAGNOSIS — M62838 Other muscle spasm: Secondary | ICD-10-CM

## 2015-04-18 DIAGNOSIS — M5010 Cervical disc disorder with radiculopathy, unspecified cervical region: Secondary | ICD-10-CM

## 2015-04-18 DIAGNOSIS — M542 Cervicalgia: Secondary | ICD-10-CM

## 2015-04-18 DIAGNOSIS — G44221 Chronic tension-type headache, intractable: Secondary | ICD-10-CM

## 2015-04-18 NOTE — Therapy (Signed)
[facility] [address], [address] [place], [place], [address] Phone: [phone]   Fax:  [phone]  Physical Therapy Treatment  Patient Details  Name: Jillian Reyes MRN: [id number] Date of Birth: 10-12-1990 Referring Provider: Rodney Langton, MD  Encounter Date: 04/18/2015      PT End of Session - 04/18/15 0917    Visit Number 4   Date for PT Re-Evaluation 06/01/15   PT Start Time 0851   PT Stop Time 0934   PT Time Calculation (min) 43 min   Activity Tolerance Patient tolerated treatment well   Behavior During Therapy [facility] for tasks assessed/performed      Past Medical History  Diagnosis Date  . Bipolar 1 disorder (HCC)   . Chicken pox as a child  . Diabetes mellitus     gestational   . Back pain, lumbosacral   . Allergic state   . Abnormal cells of cervix   . Contraceptive management 01/06/2012  . Eczema 01/06/2012  . Acne 01/06/2012  . Mid back pain   . Contusion of leg 10/15/2012    right  . Dyslipidemia 02/18/2013  . Encounter for preconception consultation 07/27/2013  . Eczematous dermatitis of eyelid 09/09/2014  . Preventative health care 04/10/2015  . Vitamin D deficiency 04/10/2015    History reviewed. No pertinent past surgical history.  There were no vitals filed for this visit.  Visit Diagnosis:  Neck pain, musculoskeletal  Cervical disc disorder with radiculopathy of cervical region  Muscle spasms of head or neck  Chronic tension-type headache, intractable      Subjective Assessment - 04/18/15 0854    Subjective Dry needling helped to relieve tension in neck muscles over 2 days.  Had a headache yesterday.   Currently in Pain? Yes   Pain Score 3    Pain Orientation Left   Pain Type Chronic pain   Pain Onset More than a month ago   Pain Frequency Constant   Aggravating Factors  work activities, lifting    Pain Relieving Factors laying down                          [facility] Adult PT Treatment/Exercise - 04/18/15 0001    Traction   Type of Traction Cervical   Min (lbs) 16   Max (lbs) 5   Hold Time 60   Rest Time 20   Time 15   Manual Therapy   Manual Therapy Myofascial release;Manual Traction;Muscle Energy Technique   Soft tissue mobilization bil Ut and cervical parapinals   Manual Traction 5/20 seconds                  PT Short Term Goals - 04/18/15 0856    PT SHORT TERM GOAL #2   Title report a 40% reduction in the frequency and intensity of daily headaches   Time 4   Period Weeks   Status On-going  10% reduction   PT SHORT TERM GOAL #3   Title report < or = to 5/10 Lt neck pain with work activities   Time 4   Period Weeks   Status On-going  5-610           PT Long Term Goals - 04/15/15 1202    PT LONG TERM GOAL #1   Title be independent in advanced HEP   Time 8   Period Weeks   Status On-going   PT LONG TERM GOAL #2  Title reduce FOTO to < or = to 38% limitation   Time 8   Period Weeks   Status On-going   PT LONG TERM GOAL #3   Title report 60% reduction in frequency and intensity of daily headaches   Time 8   Period Weeks   Status On-going   PT LONG TERM GOAL #4   Title report < or = to 3/10 Lt neck and arm pain with work tasks   Time 8   Period Weeks   Status On-going               Plan - 04/18/15 0917    Clinical Impression Statement Pt with 10% reduction in headache frequency and intensity and neck pain has reduced to 5-6/10 with work tasks.  Pt is working to improve posture and is using stretch and suboccipital release to reduce neck tension and headache pain when it occurs.  Pt with continued multiple trigger points in upper trap, paraspinals and suboccipitals.  Pt with less tension today vs evaluation.  Pt will continue to benefit from skilled PT for manual, dry needling, posutral strength and traction.     Pt will benefit from skilled therapeutic intervention in order to improve on the  following deficits Pain;Postural dysfunction;Improper body mechanics;Impaired flexibility;Increased muscle spasms   Rehab Potential Good   PT Frequency 2x / week   PT Duration 8 weeks   PT Treatment/Interventions ADLs/Self Care Home Management;Cryotherapy;Electrical Stimulation;Moist Heat;Therapeutic exercise;Therapeutic activities;Neuromuscular re-education;Patient/family education;Manual techniques;Taping;Dry needling;Passive range of motion;Traction   PT Next Visit Plan manual, DN if treated by Stacy, manual for soft tissue and trigger point release.     Consulted and Agree with Plan of Care Patient        Problem List Patient Active Problem List   Diagnosis Date Noted  . Preventative health care 04/10/2015  . Vitamin D deficiency 04/10/2015  . Cervical paraspinal muscle spasm 03/10/2015  . Dysuria 01/10/2014  . Allergic rhinitis 11/30/2013  . Migraine without aura and without status migrainosus, not intractable 11/17/2013  . Encounter for preconception consultation 07/27/2013  . Insomnia 07/02/2013  . Tendinopathy of rotator cuff 03/08/2013  . Dyslipidemia 02/18/2013  . Contraceptive management 01/06/2012  . Eczema 01/06/2012  . Acne 01/06/2012  . Hx of gestational diabetes mellitus, not currently pregnant   . Mid back pain   . Allergic state   . Abnormal cells of cervix   . Anxiety as acute reaction to exceptional stress 05/01/2011  . Bipolar 1 disorder (HCC) 07/05/2010  . Tobacco abuse 07/05/2010    Tiasia Weberg, PT 04/18/2015, 9:21 AM  [facility] [address] [place], [place], [address] Phone: [phone]   Fax:  [phone]  Name: Jillian Reyes MRN: [id number] Date of Birth: [birth year]

## 2015-04-21 ENCOUNTER — Ambulatory Visit: Payer: Federal, State, Local not specified - PPO | Attending: Sports Medicine | Admitting: Physical Therapy

## 2015-04-21 DIAGNOSIS — M5010 Cervical disc disorder with radiculopathy, unspecified cervical region: Secondary | ICD-10-CM | POA: Insufficient documentation

## 2015-04-21 DIAGNOSIS — G44221 Chronic tension-type headache, intractable: Secondary | ICD-10-CM | POA: Insufficient documentation

## 2015-04-21 DIAGNOSIS — M542 Cervicalgia: Secondary | ICD-10-CM | POA: Insufficient documentation

## 2015-04-21 DIAGNOSIS — M6248 Contracture of muscle, other site: Secondary | ICD-10-CM | POA: Insufficient documentation

## 2015-04-22 ENCOUNTER — Ambulatory Visit: Payer: Federal, State, Local not specified - PPO | Admitting: Sports Medicine

## 2015-04-26 ENCOUNTER — Ambulatory Visit: Payer: Federal, State, Local not specified - PPO

## 2015-04-26 DIAGNOSIS — M5010 Cervical disc disorder with radiculopathy, unspecified cervical region: Secondary | ICD-10-CM

## 2015-04-26 DIAGNOSIS — M542 Cervicalgia: Secondary | ICD-10-CM | POA: Diagnosis not present

## 2015-04-26 DIAGNOSIS — G44221 Chronic tension-type headache, intractable: Secondary | ICD-10-CM

## 2015-04-26 DIAGNOSIS — M62838 Other muscle spasm: Secondary | ICD-10-CM

## 2015-04-26 DIAGNOSIS — M6248 Contracture of muscle, other site: Secondary | ICD-10-CM | POA: Diagnosis present

## 2015-04-26 NOTE — Therapy (Signed)
[facility] [address], [address] [place], [place], [address] Phone: [phone]   Fax:  [phone]  Physical Therapy Treatment  Patient Details  Name: Jillian Reyes MRN: [id number] Date of Birth: [birth year] Referring Provider: Rodney Langton, MD  Encounter Date: 04/26/2015      PT End of Session - 04/26/15 0839    Visit Number 4   Date for PT Re-Evaluation 06/01/15   PT Start Time 0802   PT Stop Time 0855   PT Time Calculation (min) 53 min   Activity Tolerance Patient tolerated treatment well   Behavior During Therapy [facility] for tasks assessed/performed      Past Medical History  Diagnosis Date  . Bipolar 1 disorder (HCC)   . Chicken pox as a child  . Diabetes mellitus     gestational   . Back pain, lumbosacral   . Allergic state   . Abnormal cells of cervix   . Contraceptive management 01/06/2012  . Eczema 01/06/2012  . Acne 01/06/2012  . Mid back pain   . Contusion of leg 10/15/2012    right  . Dyslipidemia 02/18/2013  . Encounter for preconception consultation 07/27/2013  . Eczematous dermatitis of eyelid 09/09/2014  . Preventative health care 04/10/2015  . Vitamin D deficiency 04/10/2015    History reviewed. No pertinent past surgical history.  There were no vitals filed for this visit.  Visit Diagnosis:  Neck pain, musculoskeletal  Cervical disc disorder with radiculopathy of cervical region  Muscle spasms of head or neck  Chronic tension-type headache, intractable      Subjective Assessment - 04/26/15 0804    Subjective Pt reports that she has had a lot of stress at home so that hasn't been helping her neck pain.     Diagnostic tests MRI: normal, x-rays: reduced cervical lordosis.   Currently in Pain? Yes   Pain Score 4    Pain Location Neck   Pain Orientation Left   Pain Descriptors / Indicators Sore   Pain Type Chronic pain   Pain Onset More than a month ago   Pain Frequency Constant   Aggravating Factors  work activities, lifting   Pain Relieving Factors laying down, rest                         [facility] Adult PT Treatment/Exercise - 04/26/15 0001    Exercises   Exercises Neck;Shoulder   Neck Exercises: Machines for Strengthening   UBE (Upper Arm Bike) Level 1x 6 minutes (3/3)    Neck Exercises: Seated   Other Seated Exercise Lt UT stretch 2x20 seconds   Neck Exercises: Supine   Other Supine Exercise horizontal abduction with yellow band in supine on roll 2x10   Shoulder Exercises: Supine   Other Supine Exercises supine on foam roll for decompression x 3 minutes   Modalities   Modalities Moist Heat;Electrical Stimulation   Moist Heat Therapy   Number Minutes Moist Heat 15 Minutes   Moist Heat Location Cervical   Electrical Stimulation   Electrical Stimulation Location bil cervical paraspinals and UT   Electrical Stimulation Action IFC   Electrical Stimulation Parameters 15 minutes   Electrical Stimulation Goals Pain   Manual Therapy   Manual Therapy Myofascial release;Manual Traction;Muscle Energy Technique   Soft tissue mobilization bil UT and cervical parapinals   Manual Traction 5/20 seconds  PT Short Term Goals - 04/26/15 0810    PT SHORT TERM GOAL #1   Title be independent in initial HEP   Status Achieved   PT SHORT TERM GOAL #2   Title report a 40% reduction in the frequency and intensity of daily headaches   Time 4   Period Weeks   Status On-going  20% better   PT SHORT TERM GOAL #3   Title report < or = to 5/10 Lt neck pain with work activities   Time 4   Period Weeks   Status On-going  6/10           PT Long Term Goals - 04/15/15 1202    PT LONG TERM GOAL #1   Title be independent in advanced HEP   Time 8   Period Weeks   Status On-going   PT LONG TERM GOAL #2   Title reduce FOTO to < or = to 38% limitation   Time 8   Period Weeks   Status On-going   PT LONG TERM GOAL #3   Title  report 60% reduction in frequency and intensity of daily headaches   Time 8   Period Weeks   Status On-going   PT LONG TERM GOAL #4   Title report < or = to 3/10 Lt neck and arm pain with work tasks   Time 8   Period Weeks   Status On-going               Plan - 04/26/15 1610    Clinical Impression Statement Pt with 20% reduction in neck pain and headache frequency since the start of care.  Pt reports that she has been under a lot of stress so this has not helped her pain.  Pt with continued 6/10 Lt sided neck pain at the end of the work day.  Pt with continued multiple trigger points in upper trap, paraspinals and suboccipitals.  Pt tolerated supine strength exercises well  on foam roll today.  Pt will continue to benefit from skilled PT for manual, modalities, traction, strength and dry needling.     Pt will benefit from skilled therapeutic intervention in order to improve on the following deficits Pain;Postural dysfunction;Improper body mechanics;Impaired flexibility;Increased muscle spasms   Rehab Potential Good   PT Frequency 2x / week   PT Duration 8 weeks   PT Treatment/Interventions ADLs/Self Care Home Management;Cryotherapy;Electrical Stimulation;Moist Heat;Therapeutic exercise;Therapeutic activities;Neuromuscular re-education;Patient/family education;Manual techniques;Taping;Dry needling;Passive range of motion;Traction   PT Next Visit Plan manual, DN if treated by Stacy, manual for soft tissue and trigger point release.  Postural strength, flexibility and endurance.   Consulted and Agree with Plan of Care Patient        Problem List Patient Active Problem List   Diagnosis Date Noted  . Preventative health care 04/10/2015  . Vitamin D deficiency 04/10/2015  . Cervical paraspinal muscle spasm 03/10/2015  . Dysuria 01/10/2014  . Allergic rhinitis 11/30/2013  . Migraine without aura and without status migrainosus, not intractable 11/17/2013  . Encounter for  preconception consultation 07/27/2013  . Insomnia 07/02/2013  . Tendinopathy of rotator cuff 03/08/2013  . Dyslipidemia 02/18/2013  . Contraceptive management 01/06/2012  . Eczema 01/06/2012  . Acne 01/06/2012  . Hx of gestational diabetes mellitus, not currently pregnant   . Mid back pain   . Allergic state   . Abnormal cells of cervix   . Anxiety as acute reaction to exceptional stress 05/01/2011  . Bipolar 1 disorder (HCC) 07/05/2010  .  Tobacco abuse 07/05/2010    TAKACS,KELLY, PT 04/26/2015, 8:44 AM  [facility] [address] [place], [place], [address] Phone: [phone]   Fax:  [phone]  Name: Jillian Reyes MRN: [id number] Date of Birth: [birth year]

## 2015-04-28 ENCOUNTER — Ambulatory Visit: Payer: Federal, State, Local not specified - PPO | Admitting: Physical Therapy

## 2015-04-28 DIAGNOSIS — M542 Cervicalgia: Secondary | ICD-10-CM | POA: Diagnosis not present

## 2015-04-28 DIAGNOSIS — G44221 Chronic tension-type headache, intractable: Secondary | ICD-10-CM

## 2015-04-28 DIAGNOSIS — M5010 Cervical disc disorder with radiculopathy, unspecified cervical region: Secondary | ICD-10-CM

## 2015-04-28 DIAGNOSIS — M62838 Other muscle spasm: Secondary | ICD-10-CM

## 2015-04-28 NOTE — Patient Instructions (Signed)
Over Head Pull: Narrow Grip       On back, knees bent, feet flat, band across thighs, elbows straight but relaxed. Pull hands apart (start). Keeping elbows straight, bring arms up and over head, hands toward floor. Keep pull steady on band. Hold momentarily. Return slowly, keeping pull steady, back to start. Repeat _8__ times. Band color ___red___   Side Pull: Double Arm   On back, knees bent, feet flat. Arms perpendicular to body, shoulder level, elbows straight but relaxed. Pull arms out to sides, elbows straight. Resistance band comes across collarbones, hands toward floor. Hold momentarily. Slowly return to starting position. Repeat _8__ times. Band color _red___   Sash   On back, knees bent, feet flat, left hand on left hip, right hand above left. Pull right arm DIAGONALLY (hip to shoulder) across chest. Bring right arm along head toward floor. Hold momentarily. Slowly return to starting position. Repeat __8_ times. Do with left arm. Band color __red____   Shoulder Rotation: Double Arm   On back, knees bent, feet flat, elbows tucked at sides, bent 90, hands palms up. Pull hands apart and down toward floor, keeping elbows near sides. Hold momentarily. Slowly return to starting position. Repeat __8_ times. Band color __red____   

## 2015-04-28 NOTE — Therapy (Signed)
[facility] [address] [place], [place], [address] Phone: [phone]   Fax:  [phone]  Physical Therapy Treatment  Patient Details  Name: Jillian Reyes MRN: [id number] Date of Birth: 11/18/1990 Referring Provider: Rodney Langton, MD  Encounter Date: 04/28/2015      PT End of Session - 04/28/15 0815    Visit Number 5   Date for PT Re-Evaluation 06/01/15   PT Start Time 0806   PT Stop Time 0900   PT Time Calculation (min) 54 min   Activity Tolerance Patient tolerated treatment well      Past Medical History  Diagnosis Date  . Bipolar 1 disorder (HCC)   . Chicken pox as a child  . Diabetes mellitus     gestational   . Back pain, lumbosacral   . Allergic state   . Abnormal cells of cervix   . Contraceptive management 01/06/2012  . Eczema 01/06/2012  . Acne 01/06/2012  . Mid back pain   . Contusion of leg 10/15/2012    right  . Dyslipidemia 02/18/2013  . Encounter for preconception consultation 07/27/2013  . Eczematous dermatitis of eyelid 09/09/2014  . Preventative health care 04/10/2015  . Vitamin D deficiency 04/10/2015    No past surgical history on file.  There were no vitals filed for this visit.  Visit Diagnosis:  Neck pain, musculoskeletal  Cervical disc disorder with radiculopathy of cervical region  Muscle spasms of head or neck  Chronic tension-type headache, intractable      Subjective Assessment - 04/28/15 0806    Subjective Didn't sleep well.  Thought the dry needling helped.  No headaches.  3-4/10 left > right neck.  Min relief with modalities.     Currently in Pain? Yes   Pain Score 4    Pain Location Neck   Pain Orientation Left   Pain Frequency Constant   Aggravating Factors  lifting                         [facility] Adult PT Treatment/Exercise - 04/28/15 0001    Neck Exercises: Supine   Neck Retraction 10 reps   Capital Flexion 10 reps   Shoulder  Exercises: Supine   Other Supine Exercises scapular series with yellow band 8x each: overhead narrow and wide, HABD, ER, sash   Manual Therapy   Soft tissue mobilization left upper trap, levator, suboccipitals   Manual Traction 3x 30 sec   Muscle Energy Technique contract-relax left upper trap          Trigger Point Dry Needling - 04/28/15 0814    Consent Given? Yes   Muscles Treated Upper Body Upper trapezius;Suboccipitals muscle group;Levator scapulae   Upper Trapezius Response Twitch reponse elicited;Palpable increased muscle length   SubOccipitals Response Palpable increased muscle length   Levator Scapulae Response Twitch response elicited;Palpable increased muscle length      Performed bilaterally        PT Education - 04/28/15 0905    Education provided Yes   Education Details red band scapular supine ex   Person(s) Educated Patient   Methods Explanation;Demonstration;Handout   Comprehension Verbalized understanding;Returned demonstration          PT Short Term Goals - 04/28/15 0921    PT SHORT TERM GOAL #1   Title be independent in initial HEP   Status Achieved   PT SHORT TERM GOAL #2   Title report a 40% reduction in the  frequency and intensity of daily headaches   Time 4   Period Weeks   Status On-going   PT SHORT TERM GOAL #3   Title report < or = to 5/10 Lt neck pain with work activities   Time 4   Period Weeks   Status On-going           PT Long Term Goals - 04/28/15 0921    PT LONG TERM GOAL #1   Title be independent in advanced HEP   Time 8   Period Weeks   Status On-going   PT LONG TERM GOAL #2   Title reduce FOTO to < or = to 38% limitation   Time 8   Period Weeks   Status On-going   PT LONG TERM GOAL #3   Title report 60% reduction in frequency and intensity of daily headaches   Time 8   Period Weeks   Status On-going   PT LONG TERM GOAL #4   Title report < or = to 3/10 Lt neck and arm pain with work tasks   Time 8   Period  Weeks   Status On-going               Plan - 04/28/15 1610    Clinical Impression Statement The patient repoorts her stress has contributed to left > right neck pain and suboccipital region headaches.  She has multiple tender points in upper traps and is receptive to dry needling, reporting 2 days relief previously.  She is able to do supine scapular exercises without pain exacerbation.  Improved soft tissue/muscle length post session.  Therapist closely monitoring response throughout session.    PT Next Visit Plan assess response to DN #2, add red band standing rows and extensions to HEP;  manual therapy; modalities as needed        Problem List Patient Active Problem List   Diagnosis Date Noted  . Preventative health care 04/10/2015  . Vitamin D deficiency 04/10/2015  . Cervical paraspinal muscle spasm 03/10/2015  . Dysuria 01/10/2014  . Allergic rhinitis 11/30/2013  . Migraine without aura and without status migrainosus, not intractable 11/17/2013  . Encounter for preconception consultation 07/27/2013  . Insomnia 07/02/2013  . Tendinopathy of rotator cuff 03/08/2013  . Dyslipidemia 02/18/2013  . Contraceptive management 01/06/2012  . Eczema 01/06/2012  . Acne 01/06/2012  . Hx of gestational diabetes mellitus, not currently pregnant   . Mid back pain   . Allergic state   . Abnormal cells of cervix   . Anxiety as acute reaction to exceptional stress 05/01/2011  . Bipolar 1 disorder (HCC) 07/05/2010  . Tobacco abuse 07/05/2010    Vivien Presto 04/28/2015, 9:27 AM  [facility] [address], [address] [place], [place], [address] Phone: [phone]   Fax:  [phone]  Name: Jillian Reyes MRN: [id number] Date of Birth: 06/06/1990  Lavinia Sharps, PT 04/28/2015 9:28 AM Phone: [phone] Fax: [phone]

## 2015-05-03 ENCOUNTER — Telehealth: Payer: Self-pay

## 2015-05-03 ENCOUNTER — Ambulatory Visit: Payer: Federal, State, Local not specified - PPO

## 2015-05-03 NOTE — Telephone Encounter (Signed)
PT tried to call patient as she no-showed for today's appointment.  Number is disconnected.

## 2015-05-05 ENCOUNTER — Ambulatory Visit: Payer: Federal, State, Local not specified - PPO | Admitting: Physical Therapy

## 2015-05-05 DIAGNOSIS — G44221 Chronic tension-type headache, intractable: Secondary | ICD-10-CM

## 2015-05-05 DIAGNOSIS — M62838 Other muscle spasm: Secondary | ICD-10-CM

## 2015-05-05 DIAGNOSIS — M5010 Cervical disc disorder with radiculopathy, unspecified cervical region: Secondary | ICD-10-CM

## 2015-05-05 DIAGNOSIS — M542 Cervicalgia: Secondary | ICD-10-CM

## 2015-05-05 NOTE — Therapy (Signed)
[facility] [address] [place], [place], [address] Phone: [phone]   Fax:  [phone]  Physical Therapy Treatment  Patient Details  Name: Jillian Reyes MRN: [id number] Date of Birth: Sep 02, 1990 Referring Provider: Aundria Mems, MD  Encounter Date: 05/05/2015      PT End of Session - 05/05/15 1138    Visit Number 6   Date for PT Re-Evaluation 06/01/15   PT Start Time 0801   PT Stop Time 8182   PT Time Calculation (min) 53 min   Activity Tolerance Patient tolerated treatment well      Past Medical History  Diagnosis Date  . Bipolar 1 disorder ([place])   . Chicken pox as a child  . Diabetes mellitus     gestational   . Back pain, lumbosacral   . Allergic state   . Abnormal cells of cervix   . Contraceptive management 01/06/2012  . Eczema 01/06/2012  . Acne 01/06/2012  . Mid back pain   . Contusion of leg 10/15/2012    right  . Dyslipidemia 02/18/2013  . Encounter for preconception consultation 07/27/2013  . Eczematous dermatitis of eyelid 09/09/2014  . Preventative health care 04/10/2015  . Vitamin D deficiency 04/10/2015    No past surgical history on file.  There were no vitals filed for this visit.  Visit Diagnosis:  Neck pain, musculoskeletal  Cervical disc disorder with radiculopathy of cervical region  Muscle spasms of head or neck  Chronic tension-type headache, intractable      Subjective Assessment - 05/05/15 0813    Subjective Patient states she has worked 17 days straight and doesn't get another day off until Sunday.  Her job entails repetitive moving of packages but she reports she continues to avoid heavier lifting.  Had a headache yesterday but overall decreased  frequency.     Currently in Pain? Yes   Pain Score 4    Pain Location Neck   Pain Orientation Left   Pain Type Chronic pain   Pain Frequency Constant   Aggravating Factors  lifting                          [facility] Adult PT Treatment/Exercise - 05/05/15 0001    Moist Heat Therapy   Number Minutes Moist Heat 15 Minutes   Moist Heat Location Cervical   Electrical Stimulation   Electrical Stimulation Location B cervicall   Electrical Stimulation Action IFC   Electrical Stimulation Parameters 15 min   Electrical Stimulation Goals Pain   Manual Therapy   Soft tissue mobilization left upper trap, levator, suboccipitals   Manual Traction 3x 30 sec   Muscle Energy Technique contract-relax left upper trap          Trigger Point Dry Needling - 05/05/15 1137    Consent Given? Yes   Muscles Treated Upper Body Upper trapezius;Levator scapulae  B cervical multifidi   Upper Trapezius Response Twitch reponse elicited;Palpable increased muscle length   Levator Scapulae Response Twitch response elicited;Palpable increased muscle length                PT Short Term Goals - 05/05/15 1142    PT SHORT TERM GOAL #1   Title be independent in initial HEP   Status Achieved   PT SHORT TERM GOAL #2   Title report a 40% reduction in the frequency and intensity of daily headaches   Status Achieved   PT SHORT TERM  GOAL #3   Title report < or = to 5/10 Lt neck pain with work activities   Status Achieved           PT [place] - 05/05/15 1142    PT LONG TERM GOAL #1   Title be independent in advanced HEP   Time 8   Status On-going   PT LONG TERM GOAL #2   Title reduce FOTO to < or = to 38% limitation   Time 8   Period Weeks   Status On-going   PT LONG TERM GOAL #3   Title report 60% reduction in frequency and intensity of daily headaches   Period Weeks   Status On-going   PT LONG TERM GOAL #4   Title report < or = to 3/10 Lt neck and arm pain with work tasks   Time 8   Period Weeks   Status On-going               Plan - 05/05/15 1139    Clinical Impression Statement The patient has met all short term goals.  Pain persists but  is decreased in intensity and headaches are less frequent.  Hold on ther ex today secondary to patient has been overusing UEs moving packages at work for 17 days straight without a day off.  She reports dry needling seems to be helping signficantly and improved soft tissue length noted with manual therapy.  Therapist closely monitoring response.     PT Next Visit Plan assess response to DN #3, add red band standing rows and extensions to HEP;  manual therapy; modalities as needed        Problem List Patient Active Problem List   Diagnosis Date Noted  . Preventative health care 04/10/2015  . Vitamin D deficiency 04/10/2015  . Cervical paraspinal muscle spasm 03/10/2015  . Dysuria 01/10/2014  . Allergic rhinitis 11/30/2013  . Migraine without aura and without status migrainosus, not intractable 11/17/2013  . Encounter for preconception consultation 07/27/2013  . Insomnia 07/02/2013  . Tendinopathy of rotator cuff 03/08/2013  . Dyslipidemia 02/18/2013  . Contraceptive management 01/06/2012  . Eczema 01/06/2012  . Acne 01/06/2012  . Hx of gestational diabetes mellitus, not currently pregnant   . Mid back pain   . Allergic state   . Abnormal cells of cervix   . Anxiety as acute reaction to exceptional stress 05/01/2011  . Bipolar 1 disorder ([place]) 07/05/2010  . Tobacco abuse 07/05/2010    Alvera Singh 05/05/2015, 11:43 AM  [facility] [address], [place], [place], [address] Phone: [phone]   Fax:  [phone]  Name: Jillian Reyes MRN: [id number] Date of Birth: 1990/07/01    Ruben Im, PT 05/05/2015 11:43 AM Phone: [phone] Fax: [phone]

## 2015-05-10 ENCOUNTER — Ambulatory Visit: Payer: Federal, State, Local not specified - PPO | Admitting: Physical Therapy

## 2015-05-11 ENCOUNTER — Ambulatory Visit: Payer: Federal, State, Local not specified - PPO

## 2015-05-11 DIAGNOSIS — M5010 Cervical disc disorder with radiculopathy, unspecified cervical region: Secondary | ICD-10-CM

## 2015-05-11 DIAGNOSIS — G44221 Chronic tension-type headache, intractable: Secondary | ICD-10-CM

## 2015-05-11 DIAGNOSIS — M542 Cervicalgia: Secondary | ICD-10-CM

## 2015-05-11 DIAGNOSIS — M62838 Other muscle spasm: Secondary | ICD-10-CM

## 2015-05-11 NOTE — Therapy (Signed)
[facility] [address] [place], [place], [address] Phone: [phone]   Fax:  [phone]  Physical Therapy Treatment  Patient Details  Name: Jillian Reyes MRN: [id number] Date of Birth: [birth year] Referring Provider: Rodney Langton, MD  Encounter Date: 05/11/2015      PT End of Session - 05/11/15 0834    Visit Number 7   Date for PT Re-Evaluation 06/01/15   PT Start Time 0758  Pt declined e-stim today   PT Stop Time 0848   PT Time Calculation (min) 50 min   Activity Tolerance Patient tolerated treatment well   Behavior During Therapy [facility] for tasks assessed/performed      Past Medical History  Diagnosis Date  . Bipolar 1 disorder (HCC)   . Chicken pox as a child  . Diabetes mellitus     gestational   . Back pain, lumbosacral   . Allergic state   . Abnormal cells of cervix   . Contraceptive management 01/06/2012  . Eczema 01/06/2012  . Acne 01/06/2012  . Mid back pain   . Contusion of leg 10/15/2012    right  . Dyslipidemia 02/18/2013  . Encounter for preconception consultation 07/27/2013  . Eczematous dermatitis of eyelid 09/09/2014  . Preventative health care 04/10/2015  . Vitamin D deficiency 04/10/2015    History reviewed. No pertinent past surgical history.  There were no vitals filed for this visit.  Visit Diagnosis:  Neck pain, musculoskeletal  Cervical disc disorder with radiculopathy of cervical region  Muscle spasms of head or neck  Chronic tension-type headache, intractable      Subjective Assessment - 05/11/15 0758    Subjective Pt reports that she is 30-40% overall improvement in symptoms since the start of care.     Currently in Pain? Yes   Pain Score 3    Pain Location Neck   Pain Orientation Left   Pain Descriptors / Indicators Sore   Pain Type Chronic pain   Pain Onset More than a month ago   Pain Frequency Constant   Aggravating Factors  lifting, work   Pain  Relieving Factors muscle relaxers, rest, heat                         [facility] Adult PT Treatment/Exercise - 05/11/15 0001    Neck Exercises: Machines for Strengthening   UBE (Upper Arm Bike) Level 1x 6 minutes (3/3)    Neck Exercises: Supine   Upper Extremity D2 20 reps;Theraband  on foam roll   Theraband Level (UE D2) Level 2 (Red)   Other Supine Exercise horizontal abduction with red band in supine on roll 2x10   Moist Heat Therapy   Number Minutes Moist Heat 15 Minutes   Moist Heat Location Cervical   Electrical Stimulation   Electrical Stimulation Location --   Electrical Stimulation Action --   Electrical Stimulation Parameters --   Electrical Stimulation Goals --   Manual Therapy   Manual Therapy Myofascial release;Manual Traction;Muscle Energy Technique   Soft tissue mobilization left upper trap, levator, suboccipitals   Manual Traction 3x 30 sec                  PT Short Term Goals - 05/05/15 1142    PT SHORT TERM GOAL #1   Title be independent in initial HEP   Status Achieved   PT SHORT TERM GOAL #2   Title report a 40% reduction in the  frequency and intensity of daily headaches   Status Achieved   PT SHORT TERM GOAL #3   Title report < or = to 5/10 Lt neck pain with work activities   Status Achieved           PT Long Term Goals - 05/11/15 0803    PT LONG TERM GOAL #1   Title be independent in advanced HEP   Time 8   Period Weeks   Status On-going   PT LONG TERM GOAL #3   Title report 60% reduction in frequency and intensity of daily headaches   Time 8   Period Weeks   Status On-going  30-40% improvement   PT LONG TERM GOAL #4   Title report < or = to 3/10 Lt neck and arm pain with work tasks   Time 8   Period Weeks   Status On-going  5/10               Plan - 05/11/15 0808    Clinical Impression Statement Pt reports 30-40% overall reduction in headache and neck/Lt UE symptoms.  Pt with up to 5/10 Lt UE pain with  lifting at work. Pt reports that headaches are 40% less frequent and intense. Pt is compliant with HEP and postural modifications. Pt with continued tension in bil neck and UT (Lt>Rt) and this is much improved since initial evaluation. Pt will continue to benefit from PT for flexibility, postural strength, manual, dry needling and modalities.     Pt will benefit from skilled therapeutic intervention in order to improve on the following deficits Pain;Postural dysfunction;Improper body mechanics;Impaired flexibility;Increased muscle spasms   Rehab Potential Good   PT Frequency 2x / week   PT Duration 8 weeks   PT Treatment/Interventions ADLs/Self Care Home Management;Cryotherapy;Electrical Stimulation;Moist Heat;Therapeutic exercise;Therapeutic activities;Neuromuscular re-education;Patient/family education;Manual techniques;Taping;Dry needling;Passive range of motion;Traction   PT Next Visit Plan Manual, Dry needling, postural strength/endurance, modalities as needed.     Consulted and Agree with Plan of Care Patient        Problem List Patient Active Problem List   Diagnosis Date Noted  . Preventative health care 04/10/2015  . Vitamin D deficiency 04/10/2015  . Cervical paraspinal muscle spasm 03/10/2015  . Dysuria 01/10/2014  . Allergic rhinitis 11/30/2013  . Migraine without aura and without status migrainosus, not intractable 11/17/2013  . Encounter for preconception consultation 07/27/2013  . Insomnia 07/02/2013  . Tendinopathy of rotator cuff 03/08/2013  . Dyslipidemia 02/18/2013  . Contraceptive management 01/06/2012  . Eczema 01/06/2012  . Acne 01/06/2012  . Hx of gestational diabetes mellitus, not currently pregnant   . Mid back pain   . Allergic state   . Abnormal cells of cervix   . Anxiety as acute reaction to exceptional stress 05/01/2011  . Bipolar 1 disorder (HCC) 07/05/2010  . Tobacco abuse 07/05/2010    TAKACS,KELLY, PT 05/11/2015, 8:39 AM  [facility] [address], [address] [place], [place], [address] Phone: [phone]   Fax:  [phone]  Name: Jillian Reyes MRN: [id number] Date of Birth: 1990/12/24

## 2015-05-12 ENCOUNTER — Ambulatory Visit: Payer: Federal, State, Local not specified - PPO | Admitting: Physical Therapy

## 2015-05-12 DIAGNOSIS — G44221 Chronic tension-type headache, intractable: Secondary | ICD-10-CM

## 2015-05-12 DIAGNOSIS — M542 Cervicalgia: Secondary | ICD-10-CM | POA: Diagnosis not present

## 2015-05-12 DIAGNOSIS — M5010 Cervical disc disorder with radiculopathy, unspecified cervical region: Secondary | ICD-10-CM

## 2015-05-12 DIAGNOSIS — M62838 Other muscle spasm: Secondary | ICD-10-CM

## 2015-05-12 NOTE — Patient Instructions (Signed)
   [facility] [address], [address] [place], [place] [address] Phone # [phone] Fax [phone]  

## 2015-05-12 NOTE — Therapy (Signed)
[facility] [address], [address] [place], [place], [address] Phone: [phone]   Fax:  [phone]  Physical Therapy Treatment  Patient Details  Name: Jillian Reyes MRN: [id number] Date of Birth: 12-Jun-1990 Referring Provider: Rodney Langton, MD  Encounter Date: 05/12/2015      PT End of Session - 05/12/15 1417    Visit Number 8   Date for PT Re-Evaluation 06/01/15   PT Start Time 0757   PT Stop Time 0845   PT Time Calculation (min) 48 min   Activity Tolerance Patient tolerated treatment well      Past Medical History  Diagnosis Date  . Bipolar 1 disorder (HCC)   . Chicken pox as a child  . Diabetes mellitus     gestational   . Back pain, lumbosacral   . Allergic state   . Abnormal cells of cervix   . Contraceptive management 01/06/2012  . Eczema 01/06/2012  . Acne 01/06/2012  . Mid back pain   . Contusion of leg 10/15/2012    right  . Dyslipidemia 02/18/2013  . Encounter for preconception consultation 07/27/2013  . Eczematous dermatitis of eyelid 09/09/2014  . Preventative health care 04/10/2015  . Vitamin D deficiency 04/10/2015    No past surgical history on file.  There were no vitals filed for this visit.  Visit Diagnosis:  Neck pain, musculoskeletal  Cervical disc disorder with radiculopathy of cervical region  Muscle spasms of head or neck  Chronic tension-type headache, intractable      Subjective Assessment - 05/12/15 0801    Subjective I'm good.  Just had PT yesterday.  Dry needling is helping less tight in left upper trap.  I can move my neck more now.    Currently in Pain? Yes   Pain Score 2    Pain Location Neck   Pain Orientation Left   Pain Type Chronic pain   Pain Onset More than a month ago   Pain Frequency Constant                         [facility] Adult PT Treatment/Exercise - 05/12/15 0001    Neck Exercises: Prone   Other Prone Exercise prone head lift with  B shoulder exten 8x; head lift with B HABD 8x; head lift with single arm UE lift 8x each   Moist Heat Therapy   Number Minutes Moist Heat 10 Minutes   Moist Heat Location Cervical   Manual Therapy   Manual Therapy Myofascial release   Soft tissue mobilization left upper trap, levator, suboccipitals   Myofascial Release instrument assisted Graston G4 to upper trap and cervical distraction    Manual Traction 3x 30 sec   Muscle Energy Technique contract-relax left upper trap          Trigger Point Dry Needling - 05/12/15 1416    Consent Given? Yes   Muscles Treated Upper Body Upper trapezius;Suboccipitals muscle group;Levator scapulae   Upper Trapezius Response Twitch reponse elicited;Palpable increased muscle length   SubOccipitals Response Palpable increased muscle length   Levator Scapulae Response Twitch response elicited;Palpable increased muscle length     Left only.         PT Education - 05/12/15 [phone]    Education provided Yes   Education Details prone head lift /arm lift   Person(s) Educated Patient   Methods Explanation;Demonstration;Handout   Comprehension Verbalized understanding;Returned demonstration  PT Short Term Goals - 05/12/15 1421    PT SHORT TERM GOAL #1   Title be independent in initial HEP   Status Achieved   PT SHORT TERM GOAL #2   Title report a 40% reduction in the frequency and intensity of daily headaches   Status Achieved   PT SHORT TERM GOAL #3   Title report < or = to 5/10 Lt neck pain with work activities   Status Achieved           PT Long Term Goals - 05/12/15 1422    PT LONG TERM GOAL #1   Title be independent in advanced HEP   Time 8   Period Weeks   Status On-going   PT LONG TERM GOAL #2   Title reduce FOTO to < or = to 38% limitation   Time 8   Period Weeks   Status On-going   PT LONG TERM GOAL #3   Title report 60% reduction in frequency and intensity of daily headaches   Time 8   Period Weeks   Status  On-going   PT LONG TERM GOAL #4   Title report < or = to 3/10 Lt neck and arm pain with work tasks   Time 8   Period Weeks   Status On-going               Plan - 05/12/15 1418    Clinical Impression Statement Patient reports improving pain intensity and headaches, improving ROM as well.  She continues to do repetitive reaching and lifting at work.  Initiated ther ex for extensor strengthening for postural support with work activities.  Decreased trigger point size and number.  Continue up to 2 more dry needling as adjunct to manual therapy and ex.  Therapist closely monitoring response throughout all.     PT Next Visit Plan Manual, Dry needling # 5, postural strength/endurance, modalities as needed.          Problem List Patient Active Problem List   Diagnosis Date Noted  . Preventative health care 04/10/2015  . Vitamin D deficiency 04/10/2015  . Cervical paraspinal muscle spasm 03/10/2015  . Dysuria 01/10/2014  . Allergic rhinitis 11/30/2013  . Migraine without aura and without status migrainosus, not intractable 11/17/2013  . Encounter for preconception consultation 07/27/2013  . Insomnia 07/02/2013  . Tendinopathy of rotator cuff 03/08/2013  . Dyslipidemia 02/18/2013  . Contraceptive management 01/06/2012  . Eczema 01/06/2012  . Acne 01/06/2012  . Hx of gestational diabetes mellitus, not currently pregnant   . Mid back pain   . Allergic state   . Abnormal cells of cervix   . Anxiety as acute reaction to exceptional stress 05/01/2011  . Bipolar 1 disorder (HCC) 07/05/2010  . Tobacco abuse 07/05/2010    Vivien Presto 05/12/2015, 2:23 PM  [facility] [address], [address] [place], [place], [address] Phone: [phone]   Fax:  [phone]  Name: Jillian Reyes MRN: [id number] Date of Birth: [birth year]   Lavinia Sharps, PT 05/12/2015 2:23 PM Phone: [phone] Fax: [phone]

## 2015-05-17 ENCOUNTER — Ambulatory Visit: Payer: Federal, State, Local not specified - PPO

## 2015-05-17 DIAGNOSIS — M542 Cervicalgia: Secondary | ICD-10-CM

## 2015-05-17 DIAGNOSIS — M62838 Other muscle spasm: Secondary | ICD-10-CM

## 2015-05-17 DIAGNOSIS — G44221 Chronic tension-type headache, intractable: Secondary | ICD-10-CM

## 2015-05-17 DIAGNOSIS — M5010 Cervical disc disorder with radiculopathy, unspecified cervical region: Secondary | ICD-10-CM

## 2015-05-17 NOTE — Therapy (Signed)
[facility] [address], [address] [place], [place], [address] Phone: [phone]   Fax:  [phone]  Physical Therapy Treatment  Patient Details  Name: Jillian Reyes MRN: [id number] Date of Birth: [birth year] Referring Provider: Rodney Langton, MD  Encounter Date: 05/17/2015      PT End of Session - 05/17/15 0838    Visit Number 9   Date for PT Re-Evaluation 06/01/15   PT Start Time 0802   PT Stop Time 0850   PT Time Calculation (min) 48 min   Activity Tolerance Patient tolerated treatment well   Behavior During Therapy [facility] for tasks assessed/performed      Past Medical History  Diagnosis Date  . Bipolar 1 disorder (HCC)   . Chicken pox as a child  . Diabetes mellitus     gestational   . Back pain, lumbosacral   . Allergic state   . Abnormal cells of cervix   . Contraceptive management 01/06/2012  . Eczema 01/06/2012  . Acne 01/06/2012  . Mid back pain   . Contusion of leg 10/15/2012    right  . Dyslipidemia 02/18/2013  . Encounter for preconception consultation 07/27/2013  . Eczematous dermatitis of eyelid 09/09/2014  . Preventative health care 04/10/2015  . Vitamin D deficiency 04/10/2015    History reviewed. No pertinent past surgical history.  There were no vitals filed for this visit.  Visit Diagnosis:  Neck pain, musculoskeletal  Cervical disc disorder with radiculopathy of cervical region  Muscle spasms of head or neck  Chronic tension-type headache, intractable      Subjective Assessment - 05/17/15 0804    Subjective Started working out at the gym last week so muscles are sore.  Pt didn't sleep a lot last night so feeling very tired today.  No headache this week.   Diagnostic tests MRI: normal, x-rays: reduced cervical lordosis.   Currently in Pain? Yes   Pain Score 2    Pain Location Neck   Pain Orientation Left   Pain Descriptors / Indicators Sore   Pain Type Chronic pain   Pain Onset  More than a month ago   Pain Frequency Constant   Aggravating Factors  lifting, work   Pain Relieving Factors muscle relaxers, rest, heat                         [facility] Adult PT Treatment/Exercise - 05/17/15 0001    Neck Exercises: Machines for Strengthening   UBE (Upper Arm Bike) Level 1x 6 minutes (3/3)   seated on green ball   Neck Exercises: Prone   Other Prone Exercise Prone I, T, shoulder extension 2x10 bil. each.    tactile cues for scapular activation   Moist Heat Therapy   Number Minutes Moist Heat 10 Minutes   Moist Heat Location Cervical   Manual Therapy   Manual Therapy Myofascial release   Soft tissue mobilization left upper trap, levator, suboccipitals   Manual Traction 3x 30 sec                  PT Short Term Goals - 05/12/15 1421    PT SHORT TERM GOAL #1   Title be independent in initial HEP   Status Achieved   PT SHORT TERM GOAL #2   Title report a 40% reduction in the frequency and intensity of daily headaches   Status Achieved   PT SHORT TERM GOAL #3   Title report <  or = to 5/10 Lt neck pain with work activities   Status Achieved           PT Long Term Goals - 05/17/15 0806    PT LONG TERM GOAL #1   Title be independent in advanced HEP   Time 8   Period Weeks   Status On-going   PT LONG TERM GOAL #2   Title reduce FOTO to < or = to 38% limitation   Time 8   Period Weeks   Status On-going   PT LONG TERM GOAL #3   Title report 60% reduction in frequency and intensity of daily headaches   Time 8   Period Weeks   Status On-going  50% reduction   PT LONG TERM GOAL #4   Title report < or = to 3/10 Lt neck and arm pain with work tasks   Time 8   Period Weeks   Status On-going  5/10 with work, 2-3/10 on weekends               Plan - 05/17/15 0807    Clinical Impression Statement Pt reports 50% reduction in headache frequency and intensity and reports no headaches this week.  Pt reports 40% reduction in neck  and shoulder pain with home and work tasks.  Pain is 5/10 with work tasks and 2-3/10 at home.  Pt able to tolerate strenth exercises today for neck and scapular musculature.  Decreased trigger point on the Lt with manual therapy today.  Pt will continue to benefit from skilled PT for manual therapy, strength and flexiblity.   Pt will benefit from skilled therapeutic intervention in order to improve on the following deficits Pain;Postural dysfunction;Improper body mechanics;Impaired flexibility;Increased muscle spasms   Rehab Potential Good   PT Frequency 2x / week   PT Duration 8 weeks   PT Treatment/Interventions ADLs/Self Care Home Management;Cryotherapy;Electrical Stimulation;Moist Heat;Therapeutic exercise;Therapeutic activities;Neuromuscular re-education;Patient/family education;Manual techniques;Taping;Dry needling;Passive range of motion;Traction   PT Next Visit Plan Manual, Dry needling # 5, postural strength/endurance, modalities as needed.     Consulted and Agree with Plan of Care Patient        Problem List Patient Active Problem List   Diagnosis Date Noted  . Preventative health care 04/10/2015  . Vitamin D deficiency 04/10/2015  . Cervical paraspinal muscle spasm 03/10/2015  . Dysuria 01/10/2014  . Allergic rhinitis 11/30/2013  . Migraine without aura and without status migrainosus, not intractable 11/17/2013  . Encounter for preconception consultation 07/27/2013  . Insomnia 07/02/2013  . Tendinopathy of rotator cuff 03/08/2013  . Dyslipidemia 02/18/2013  . Contraceptive management 01/06/2012  . Eczema 01/06/2012  . Acne 01/06/2012  . Hx of gestational diabetes mellitus, not currently pregnant   . Mid back pain   . Allergic state   . Abnormal cells of cervix   . Anxiety as acute reaction to exceptional stress 05/01/2011  . Bipolar 1 disorder (HCC) 07/05/2010  . Tobacco abuse 07/05/2010    Omere Marti, PT 05/17/2015, 8:41 AM  [facility] [address], [address] [place], [place], [address] Phone: [phone]   Fax:  [phone]  Name: Jillian Reyes MRN: [id number] Date of Birth: [birth year]

## 2015-05-19 ENCOUNTER — Ambulatory Visit: Payer: Federal, State, Local not specified - PPO | Attending: Sports Medicine | Admitting: Physical Therapy

## 2015-05-19 DIAGNOSIS — M62838 Other muscle spasm: Secondary | ICD-10-CM

## 2015-05-19 DIAGNOSIS — G44221 Chronic tension-type headache, intractable: Secondary | ICD-10-CM | POA: Diagnosis present

## 2015-05-19 DIAGNOSIS — M6248 Contracture of muscle, other site: Secondary | ICD-10-CM | POA: Insufficient documentation

## 2015-05-19 DIAGNOSIS — M5010 Cervical disc disorder with radiculopathy, unspecified cervical region: Secondary | ICD-10-CM | POA: Diagnosis present

## 2015-05-19 DIAGNOSIS — M542 Cervicalgia: Secondary | ICD-10-CM

## 2015-05-19 NOTE — Patient Instructions (Signed)
Deep cervical flexor activation 2-4x/day with functional activities and ex;  Keep doing Is,Ys,Ts

## 2015-05-19 NOTE — Therapy (Signed)
[facility] [address] [place], [place], [address] Phone: [phone]   Fax:  [phone]  Physical Therapy Treatment  Patient Details  Name: Jillian Reyes MRN: [id number] Date of Birth: 02-02-1991 Referring Provider: Rodney Langton, MD  Encounter Date: 05/19/2015      PT End of Session - 05/19/15 0837    Visit Number 10   Date for PT Re-Evaluation 06/01/15   PT Start Time 0755   PT Stop Time 0840   PT Time Calculation (min) 45 min   Activity Tolerance Patient tolerated treatment well      Past Medical History  Diagnosis Date  . Bipolar 1 disorder (HCC)   . Chicken pox as a child  . Diabetes mellitus     gestational   . Back pain, lumbosacral   . Allergic state   . Abnormal cells of cervix   . Contraceptive management 01/06/2012  . Eczema 01/06/2012  . Acne 01/06/2012  . Mid back pain   . Contusion of leg 10/15/2012    right  . Dyslipidemia 02/18/2013  . Encounter for preconception consultation 07/27/2013  . Eczematous dermatitis of eyelid 09/09/2014  . Preventative health care 04/10/2015  . Vitamin D deficiency 04/10/2015    No past surgical history on file.  There were no vitals filed for this visit.  Visit Diagnosis:  Neck pain, musculoskeletal  Cervical disc disorder with radiculopathy of cervical region  Muscle spasms of head or neck  Chronic tension-type headache, intractable      Subjective Assessment - 05/19/15 0756    Subjective Trying to get back into going to the gym.  Will go for follow up soon with Dr. Jerolyn Shin.  One spot in left upper trap.     Currently in Pain? Yes   Pain Score 1    Pain Location Neck   Pain Orientation Left                         [facility] Adult PT Treatment/Exercise - 05/19/15 0001    Neck Exercises: Supine   Capital Flexion 10 reps   Moist Heat Therapy   Number Minutes Moist Heat 10 Minutes   Moist Heat Location Cervical   Manual Therapy   Soft tissue mobilization left upper trap, levator, suboccipitals   Myofascial Release instrument assisted Graston G4 to upper trap and cervical distraction    Manual Traction 3x 30 sec   Muscle Energy Technique contract-relax left upper trap          Trigger Point Dry Needling - 05/19/15 0835    Muscles Treated Upper Body --  left cervical multifidi   Upper Trapezius Response Twitch reponse elicited;Palpable increased muscle length   Levator Scapulae Response Palpable increased muscle length     Left only         PT Education - 05/19/15 0836    Education provided Yes   Education Details deep cervical flexor activation;  Is,Ys, Ts   Person(s) Educated Patient   Methods Explanation;Demonstration   Comprehension Verbalized understanding          PT Short Term Goals - 05/19/15 0849    PT SHORT TERM GOAL #1   Title be independent in initial HEP   Status Achieved   PT SHORT TERM GOAL #2   Title report a 40% reduction in the frequency and intensity of daily headaches   Status Achieved   PT SHORT TERM GOAL #3  Title report < or = to 5/10 Lt neck pain with work activities   Status Achieved           PT Long Term Goals - 05/19/15 0849    PT LONG TERM GOAL #1   Title be independent in advanced HEP   Time 8   Period Weeks   Status On-going   PT LONG TERM GOAL #2   Title reduce FOTO to < or = to 38% limitation   Time 8   Period Weeks   Status On-going   PT LONG TERM GOAL #3   Title report 60% reduction in frequency and intensity of daily headaches   Time 8   Period Weeks   Status On-going   PT LONG TERM GOAL #4   Title report < or = to 3/10 Lt neck and arm pain with work tasks   Time 8   Period Weeks   Status On-going               Plan - 05/19/15 1610    Clinical Impression Statement The patient reports decreasing neck pain and headaches.  Decreased left upper trap trigger point size and number.  Improved soft tissue length in  suboccipitals, levator and upper trap.  Therapist closely monitoring response.     PT Next Visit Plan Recheck progress toward goals, ROM;  FOTO;  recert vs. discharge to independent HEP/gym program        Problem List Patient Active Problem List   Diagnosis Date Noted  . Preventative health care 04/10/2015  . Vitamin D deficiency 04/10/2015  . Cervical paraspinal muscle spasm 03/10/2015  . Dysuria 01/10/2014  . Allergic rhinitis 11/30/2013  . Migraine without aura and without status migrainosus, not intractable 11/17/2013  . Encounter for preconception consultation 07/27/2013  . Insomnia 07/02/2013  . Tendinopathy of rotator cuff 03/08/2013  . Dyslipidemia 02/18/2013  . Contraceptive management 01/06/2012  . Eczema 01/06/2012  . Acne 01/06/2012  . Hx of gestational diabetes mellitus, not currently pregnant   . Mid back pain   . Allergic state   . Abnormal cells of cervix   . Anxiety as acute reaction to exceptional stress 05/01/2011  . Bipolar 1 disorder (HCC) 07/05/2010  . Tobacco abuse 07/05/2010    Vivien Presto 05/19/2015, 8:51 AM  [facility] [address], [address] [place], [place], [address] Phone: [phone]   Fax:  [phone]  Name: Jillian Reyes MRN: [id number] Date of Birth: 1990-08-23   Lavinia Sharps, PT 05/19/2015 8:52 AM Phone: [phone] Fax: [phone]

## 2015-05-24 ENCOUNTER — Ambulatory Visit: Payer: Federal, State, Local not specified - PPO

## 2015-05-24 DIAGNOSIS — M542 Cervicalgia: Secondary | ICD-10-CM

## 2015-05-24 DIAGNOSIS — G44221 Chronic tension-type headache, intractable: Secondary | ICD-10-CM

## 2015-05-24 DIAGNOSIS — M62838 Other muscle spasm: Secondary | ICD-10-CM

## 2015-05-24 DIAGNOSIS — M5010 Cervical disc disorder with radiculopathy, unspecified cervical region: Secondary | ICD-10-CM

## 2015-05-24 NOTE — Therapy (Signed)
[facility] [address] [place], [place], [address] Phone: [phone]   Fax:  [phone]  Physical Therapy Treatment  Patient Details  Name: Jillian Reyes MRN: [id number] Date of Birth: 1990-12-03 Referring Provider: Rodney Langton, MD  Encounter Date: 05/24/2015      PT End of Session - 05/24/15 0839    Visit Number 11   Date for PT Re-Evaluation 06/01/15   PT Start Time 0800   PT Stop Time 0850   PT Time Calculation (min) 50 min   Activity Tolerance Patient tolerated treatment well   Behavior During Therapy [facility] for tasks assessed/performed      Past Medical History  Diagnosis Date  . Bipolar 1 disorder (HCC)   . Chicken pox as a child  . Diabetes mellitus     gestational   . Back pain, lumbosacral   . Allergic state   . Abnormal cells of cervix   . Contraceptive management 01/06/2012  . Eczema 01/06/2012  . Acne 01/06/2012  . Mid back pain   . Contusion of leg 10/15/2012    right  . Dyslipidemia 02/18/2013  . Encounter for preconception consultation 07/27/2013  . Eczematous dermatitis of eyelid 09/09/2014  . Preventative health care 04/10/2015  . Vitamin D deficiency 04/10/2015    History reviewed. No pertinent past surgical history.  There were no vitals filed for this visit.  Visit Diagnosis:  Neck pain, musculoskeletal  Cervical disc disorder with radiculopathy of cervical region  Muscle spasms of head or neck  Chronic tension-type headache, intractable      Subjective Assessment - 05/24/15 0801    Subjective Not able to do exercises over the weekend due to being so busy.  Pt reports soreness over the weekend.     Diagnostic tests MRI: normal, x-rays: reduced cervical lordosis.   Currently in Pain? Yes   Pain Score 4    Pain Location Neck   Pain Orientation Left   Pain Descriptors / Indicators Sore   Pain Type Chronic pain   Pain Onset More than a month ago   Pain Frequency  Constant   Aggravating Factors  lifting, work   Pain Relieving Factors muscle relaxers, heat            [facility] PT Assessment - 05/24/15 0001    Observation/Other Assessments   Focus on Therapeutic Outcomes (FOTO)  39% limited                     [facility] Adult PT Treatment/Exercise - 05/24/15 0001    Neck Exercises: Machines for Strengthening   UBE (Upper Arm Bike) Level 1x 6 minutes (3/3)   seated on green ball   Neck Exercises: Supine   Capital Flexion 10 reps   Neck Exercises: Prone   Other Prone Exercise Prone I, T, shoulder extension 2x10 bil. each.    tactile cues for scapular activation   Moist Heat Therapy   Number Minutes Moist Heat 10 Minutes   Moist Heat Location Cervical   Manual Therapy   Manual Therapy Myofascial release   Soft tissue mobilization left upper trap, levator, suboccipitals   Manual Traction 3x 30 sec   Muscle Energy Technique contract-relax left upper trap                  PT Short Term Goals - 05/19/15 0849    PT SHORT TERM GOAL #1   Title be independent in initial HEP   Status  Achieved   PT SHORT TERM GOAL #2   Title report a 40% reduction in the frequency and intensity of daily headaches   Status Achieved   PT SHORT TERM GOAL #3   Title report < or = to 5/10 Lt neck pain with work activities   Status Achieved           PT Long Term Goals - 05/24/15 0806    PT LONG TERM GOAL #1   Title be independent in advanced HEP   Time 8   Period Weeks   Status On-going   PT LONG TERM GOAL #2   Title reduce FOTO to < or = to 38% limitation   Time 8   Period Weeks   Status On-going  39% limitation   PT LONG TERM GOAL #3   Title report 60% reduction in frequency and intensity of daily headaches   Time 8   Period Weeks   Status On-going  45% reduction   PT LONG TERM GOAL #4   Title report < or = to 3/10 Lt neck and arm pain with work tasks   Period Weeks   Status On-going  up to 5/10               Plan  - 05/24/15 0808    Clinical Impression Statement Pt reports 45% reduction in headache frequency overall.  Pt reports 45% reduction in neck pain frequency and intensity. FOTO has improved to 39% limitation.  Pt with improved soft tissue length in the suboccipitals, levator and UT.  Pt is independent in HEP for postural strength and cervical flexibility.  Pt will attend 2 more PT sessions and will liekly be discharged to HEP.     Pt will benefit from skilled therapeutic intervention in order to improve on the following deficits Pain;Postural dysfunction;Improper body mechanics;Impaired flexibility;Increased muscle spasms   Rehab Potential Good   PT Frequency 2x / week   PT Duration 8 weeks   PT Treatment/Interventions ADLs/Self Care Home Management;Cryotherapy;Electrical Stimulation;Moist Heat;Therapeutic exercise;Therapeutic activities;Neuromuscular re-education;Patient/family education;Manual techniques;Taping;Dry needling;Passive range of motion;Traction   Consulted and Agree with Plan of Care Patient        Problem List Patient Active Problem List   Diagnosis Date Noted  . Preventative health care 04/10/2015  . Vitamin D deficiency 04/10/2015  . Cervical paraspinal muscle spasm 03/10/2015  . Dysuria 01/10/2014  . Allergic rhinitis 11/30/2013  . Migraine without aura and without status migrainosus, not intractable 11/17/2013  . Encounter for preconception consultation 07/27/2013  . Insomnia 07/02/2013  . Tendinopathy of rotator cuff 03/08/2013  . Dyslipidemia 02/18/2013  . Contraceptive management 01/06/2012  . Eczema 01/06/2012  . Acne 01/06/2012  . Hx of gestational diabetes mellitus, not currently pregnant   . Mid back pain   . Allergic state   . Abnormal cells of cervix   . Anxiety as acute reaction to exceptional stress 05/01/2011  . Bipolar 1 disorder (HCC) 07/05/2010  . Tobacco abuse 07/05/2010    TAKACS,KELLY, PT 05/24/2015, 8:40 AM  [facility] [address], [address] [place], [place], [address] Phone: [phone]   Fax:  [phone]  Name: Jillian Reyes MRN: [id number] Date of Birth: 12/03/1990

## 2015-05-25 ENCOUNTER — Ambulatory Visit (INDEPENDENT_AMBULATORY_CARE_PROVIDER_SITE_OTHER): Payer: Self-pay | Admitting: Sports Medicine

## 2015-05-25 VITALS — BP 111/74 | HR 91 | Resp 18 | Wt 183.9 lb

## 2015-05-25 DIAGNOSIS — M5412 Radiculopathy, cervical region: Secondary | ICD-10-CM

## 2015-05-25 DIAGNOSIS — M6249 Contracture of muscle, multiple sites: Secondary | ICD-10-CM

## 2015-05-25 DIAGNOSIS — F319 Bipolar disorder, unspecified: Secondary | ICD-10-CM

## 2015-05-25 DIAGNOSIS — M62838 Other muscle spasm: Secondary | ICD-10-CM

## 2015-05-25 MED ORDER — DULOXETINE HCL 60 MG PO CPEP: 60.0000 mg | ORAL_CAPSULE | Freq: Every day | ORAL | Status: DC

## 2015-05-25 NOTE — Assessment & Plan Note (Signed)
Currently depressive phase, still taking mood stabilizer, good improvement in symptoms with the addition of low-dose Cymbalta, no manic symptoms. Increasing Cymbalta to 60 mg daily, she does need a serotonin and norepinephrine reuptake inhibition for her pain symptoms. We'll keep a close eye for mania.

## 2015-05-25 NOTE — Assessment & Plan Note (Signed)
Fantastic improvements in symptoms. Continue with current treatment regimen. May continue dry needling as desired, principal improvement is from Cymbalta, her MRI was negative so this is related more to myofascial pain syndrome and depression. Increasing dose of Cymbalta.
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
## 2015-05-27 ENCOUNTER — Ambulatory Visit: Payer: Federal, State, Local not specified - PPO | Admitting: Physical Therapy

## 2015-05-27 DIAGNOSIS — M542 Cervicalgia: Secondary | ICD-10-CM | POA: Diagnosis not present

## 2015-05-27 DIAGNOSIS — M5010 Cervical disc disorder with radiculopathy, unspecified cervical region: Secondary | ICD-10-CM

## 2015-05-27 DIAGNOSIS — G44221 Chronic tension-type headache, intractable: Secondary | ICD-10-CM

## 2015-05-27 DIAGNOSIS — M62838 Other muscle spasm: Secondary | ICD-10-CM

## 2015-05-27 NOTE — Therapy (Signed)
[facility] [address] [place], [place], [address] Phone: [phone]   Fax:  [phone]  Physical Therapy Treatment  Patient Details  Name: Jillian Reyes MRN: [id number] Date of Birth: Mar 04, 1991 Referring Provider: Rodney Langton, MD  Encounter Date: 05/27/2015      PT End of Session - 05/27/15 0827    Visit Number 12   Date for PT Re-Evaluation 06/01/15   PT Start Time 0800   PT Stop Time 0848   PT Time Calculation (min) 48 min   Activity Tolerance Patient tolerated treatment well      Past Medical History  Diagnosis Date  . Bipolar 1 disorder (HCC)   . Chicken pox as a child  . Diabetes mellitus     gestational   . Back pain, lumbosacral   . Allergic state   . Abnormal cells of cervix   . Contraceptive management 01/06/2012  . Eczema 01/06/2012  . Acne 01/06/2012  . Mid back pain   . Contusion of leg 10/15/2012    right  . Dyslipidemia 02/18/2013  . Encounter for preconception consultation 07/27/2013  . Eczematous dermatitis of eyelid 09/09/2014  . Preventative health care 04/10/2015  . Vitamin D deficiency 04/10/2015    No past surgical history on file.  There were no vitals filed for this visit.  Visit Diagnosis:  Neck pain, musculoskeletal  Cervical disc disorder with radiculopathy of cervical region  Muscle spasms of head or neck  Chronic tension-type headache, intractable      Subjective Assessment - 05/27/15 0804    Subjective The last 2 days have been horrible for no apparent reason.  Not sleeping well.  Had to take migraine pill last night.  Groggy this morning.   Currently in Pain? Yes   Pain Score 5    Pain Location Neck   Pain Orientation Left   Pain Type Chronic pain   Pain Onset More than a month ago   Pain Frequency Constant                         [facility] Adult PT Treatment/Exercise - 05/27/15 0001    Moist Heat Therapy   Number Minutes Moist  Heat 10 Minutes   Moist Heat Location Cervical   Electrical Stimulation   Electrical Stimulation Location B cervical    Electrical Stimulation Action IFC attended with dry needles to B cervical multifidi   Electrical Stimulation Parameters Sensory level 1    Electrical Stimulation Goals Pain   Manual Therapy   Manual therapy comments suboccipital release; muscle energy left upper trap   Soft tissue mobilization left upper trap, levator, suboccipitals   Manual Traction 3x 30 sec          Trigger Point Dry Needling - 05/27/15 0825    Consent Given? Yes   Muscles Treated Upper Body --  B cervical multifidi   Upper Trapezius Response Twitch reponse elicited;Palpable increased muscle length   SubOccipitals Response Palpable increased muscle length   Levator Scapulae Response Twitch response elicited;Palpable increased muscle length                PT Short Term Goals - 05/27/15 0845    PT SHORT TERM GOAL #1   Title be independent in initial HEP   Status Achieved   PT SHORT TERM GOAL #2   Title report a 40% reduction in the frequency and intensity of daily headaches   Status Achieved  PT SHORT TERM GOAL #3   Title report < or = to 5/10 Lt neck pain with work activities   Status Achieved           PT Long Term Goals - 05/27/15 0845    PT LONG TERM GOAL #1   Title be independent in advanced HEP   Time 8   Period Weeks   Status On-going   PT LONG TERM GOAL #2   Title reduce FOTO to < or = to 38% limitation   Time 8   Period Weeks   Status On-going   PT LONG TERM GOAL #3   Title report 60% reduction in frequency and intensity of daily headaches   Time 8   Period Weeks   Status On-going   PT LONG TERM GOAL #4   Title report < or = to 3/10 Lt neck and arm pain with work tasks   Time 8   Period Weeks   Status On-going               Plan - 05/27/15 0841    Clinical Impression Statement The patient had an exacerbation of left neck pain and headache  for no apparent reason the last 2 days.  She feels better this morning after taking migraine medicine last night but is still quite tender in mid and upper neck musculature.  Much improved soft tissue length following treatment session.  Hold on ther ex secondary to recent exacerbation, patient will resume her HEP in 1-2 days.  Therapist closely monitoring response throughout session.     PT Next Visit Plan assess response to DN with e-stim;  recheck progress toward goals and ROM;  review HEP/gym program for possible discharge        Problem List Patient Active Problem List   Diagnosis Date Noted  . Preventative health care 04/10/2015  . Vitamin D deficiency 04/10/2015  . Cervical paraspinal muscle spasm 03/10/2015  . Dysuria 01/10/2014  . Allergic rhinitis 11/30/2013  . Migraine without aura and without status migrainosus, not intractable 11/17/2013  . Encounter for preconception consultation 07/27/2013  . Insomnia 07/02/2013  . Tendinopathy of rotator cuff 03/08/2013  . Dyslipidemia 02/18/2013  . Contraceptive management 01/06/2012  . Eczema 01/06/2012  . Acne 01/06/2012  . Hx of gestational diabetes mellitus, not currently pregnant   . Mid back pain   . Allergic state   . Abnormal cells of cervix   . Anxiety as acute reaction to exceptional stress 05/01/2011  . Bipolar 1 disorder (HCC) 07/05/2010  . Tobacco abuse 07/05/2010   Lavinia SharpsStacy Letrice Pollok, PT 05/27/2015 8:48 AM Phone: [phone] Fax: [phone] Vivien PrestoSimpson, Sabreena Vogan C 05/27/2015, 8:47 AM  [facility] [address] [place], [place], [address] Phone: [phone]   Fax:  [phone]  Name: Janae SauceDebra A Otero MRN: [id number] Date of Birth: 11/04/1990

## 2015-05-31 ENCOUNTER — Ambulatory Visit: Payer: Federal, State, Local not specified - PPO

## 2015-05-31 DIAGNOSIS — M5010 Cervical disc disorder with radiculopathy, unspecified cervical region: Secondary | ICD-10-CM

## 2015-05-31 DIAGNOSIS — G44221 Chronic tension-type headache, intractable: Secondary | ICD-10-CM

## 2015-05-31 DIAGNOSIS — M542 Cervicalgia: Secondary | ICD-10-CM | POA: Diagnosis not present

## 2015-05-31 DIAGNOSIS — M62838 Other muscle spasm: Secondary | ICD-10-CM

## 2015-05-31 NOTE — Therapy (Signed)
[facility] [address] [place], [place], [address] Phone: [phone]   Fax:  [phone]  Physical Therapy Treatment  Patient Details  Name: Jillian Reyes MRN: [id number] Date of Birth: 1990-12-18 Referring Provider: Aundria Mems, MD  Encounter Date: 05/31/2015      PT End of Session - 05/31/15 1010    Visit Number 13   PT Start Time 0930   PT Stop Time 1021   PT Time Calculation (min) 51 min   Activity Tolerance Patient tolerated treatment well   Behavior During Therapy [facility] for tasks assessed/performed      Past Medical History  Diagnosis Date  . Bipolar 1 disorder ([place])   . Chicken pox as a child  . Diabetes mellitus     gestational   . Back pain, lumbosacral   . Allergic state   . Abnormal cells of cervix   . Contraceptive management 01/06/2012  . Eczema 01/06/2012  . Acne 01/06/2012  . Mid back pain   . Contusion of leg 10/15/2012    right  . Dyslipidemia 02/18/2013  . Encounter for preconception consultation 07/27/2013  . Eczematous dermatitis of eyelid 09/09/2014  . Preventative health care 04/10/2015  . Vitamin D deficiency 04/10/2015    History reviewed. No pertinent past surgical history.  There were no vitals filed for this visit.  Visit Diagnosis:  Neck pain, musculoskeletal  Cervical disc disorder with radiculopathy of cervical region  Muscle spasms of head or neck  Chronic tension-type headache, intractable      Subjective Assessment - 05/31/15 0936    Subjective Pt is ready for D/C.  Pain is intermittent.  60% overall improvement since the start of care.     Diagnostic tests MRI: normal, x-rays: reduced cervical lordosis.   Currently in Pain? Yes   Pain Score 2    Pain Location Neck   Pain Orientation Left   Pain Descriptors / Indicators Sore   Pain Type Chronic pain   Pain Onset More than a month ago   Pain Frequency Constant   Aggravating Factors  lifting, work   Pain Relieving Factors muscle relaxers, heat            [facility] PT Assessment - 05/31/15 0001    Assessment   Medical Diagnosis Lt cervical radiculopathy   Onset Date/Surgical Date 11/12/14   Precautions   Precautions None   Home Environment   Living Environment Private residence   Living Arrangements Children   Prior Function   Level of Independence Independent   Vocation Full time employment   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  Green band horizonal abduction and D2 2x10   Moist Heat Therapy   Number Minutes Moist Heat 10 Minutes   Moist Heat Location Cervical   Manual Therapy   Manual Therapy Myofascial release   Manual therapy comments suboccipital, Lt UT  Soft tissue mobilization left upper trap, levator, suboccipitals   Myofascial Release soft tissue elongation with trigger point release                  PT Short Term Goals - 05/27/15 0845    PT SHORT TERM GOAL #1   Title be independent in initial HEP   Status Achieved   PT SHORT TERM GOAL #2   Title report a 40% reduction in the frequency and intensity of daily headaches   Status Achieved   PT SHORT TERM GOAL #3   Title report < or = to 5/10 Lt neck pain with work activities   Status Achieved           PT Long Term Goals - 05/31/15 0938    PT LONG TERM GOAL #1   Title be independent in advanced HEP   Status Achieved   PT LONG TERM GOAL #2   Title reduce FOTO to < or = to 38% limitation   Status  Partially Met   PT LONG TERM GOAL #3   Title report 60% reduction in frequency and intensity of daily headaches   Status Achieved   PT LONG TERM GOAL #4   Title report < or = to 3/10 Lt neck and arm pain with work tasks   Status Achieved               Plan - 05/31/15 0939    Clinical Impression Statement Pt is feeling better this week.  Ready for D/C to HEP.  Pt reports 60% overall pain reduction regarding headaches and neck/arm pain.  Pt with mild tension and active trigger points remaining in UT and cervical paraspinals/suboccipitals. Pt will continue with HEP and postural modifications for continued gains.  Pt is independent in final HEP.  Pt will continue with HEP.     PT Next Visit Plan D/C PT to HEP.    Consulted and Agree with Plan of Care Patient        Problem List Patient Active Problem List   Diagnosis Date Noted  . Preventative health care 04/10/2015  . Vitamin D deficiency 04/10/2015  . Cervical paraspinal muscle spasm 03/10/2015  . Dysuria 01/10/2014  . Allergic rhinitis 11/30/2013  . Migraine without aura and without status migrainosus, not intractable 11/17/2013  . Encounter for preconception consultation 07/27/2013  . Insomnia 07/02/2013  . Tendinopathy of rotator cuff 03/08/2013  . Dyslipidemia 02/18/2013  . Contraceptive management 01/06/2012  . Eczema 01/06/2012  . Acne 01/06/2012  . Hx of gestational diabetes mellitus, not currently pregnant   . Mid back pain   . Allergic state   . Abnormal cells of cervix   . Anxiety as acute reaction to exceptional stress 05/01/2011  . Bipolar 1 disorder ([place]) 07/05/2010  . Tobacco abuse 07/05/2010  PHYSICAL THERAPY DISCHARGE SUMMARY  Visits from Start of Care: 13  Current functional level related to goals / functional outcomes: See above for current status.   Remaining deficits: Pt with neck and UE pain with work tasks although this is reduced to 3/10 most days.  Pt with intermittent headaches that have  reduced by 60%.  Pt has HEP in place.   Education / Equipment: HEP, posture/body mechanics Plan: Patient agrees to discharge.  Patient goals were partially met. Patient is being discharged due to being pleased with the current functional level.  ?????     Jillian Reyes, PT 05/31/2015 10:11 AM   [facility] [address] [place], [place], [address] Phone: [phone]   Fax:  [phone]  Name: Jillian Reyes MRN: [id number] Date of Birth: February 05, 1991

## 2015-06-01 ENCOUNTER — Ambulatory Visit: Payer: Federal, State, Local not specified - PPO

## 2015-06-16 ENCOUNTER — Ambulatory Visit (INDEPENDENT_AMBULATORY_CARE_PROVIDER_SITE_OTHER): Payer: Federal, State, Local not specified - PPO | Admitting: Family Medicine

## 2015-06-16 ENCOUNTER — Encounter: Payer: Self-pay | Admitting: Family Medicine

## 2015-06-16 VITALS — BP 110/74 | HR 81 | Temp 98.3°F | Ht 65.0 in | Wt 180.2 lb

## 2015-06-16 DIAGNOSIS — N912 Amenorrhea, unspecified: Secondary | ICD-10-CM | POA: Diagnosis not present

## 2015-06-16 DIAGNOSIS — L0233 Carbuncle of buttock: Secondary | ICD-10-CM | POA: Diagnosis not present

## 2015-06-16 DIAGNOSIS — L0232 Furuncle of buttock: Secondary | ICD-10-CM

## 2015-06-16 LAB — POCT URINE PREGNANCY: PREG TEST UR: NEGATIVE

## 2015-06-16 MED ORDER — DOXYCYCLINE HYCLATE 100 MG PO CAPS: 100.0000 mg | ORAL_CAPSULE | Freq: Two times a day (BID) | ORAL | Status: DC

## 2015-06-16 MED ORDER — FLUCONAZOLE 150 MG PO TABS: 150.0000 mg | ORAL_TABLET | Freq: Once | ORAL | Status: DC

## 2015-06-16 NOTE — Patient Instructions (Signed)
We are going to treat you with doxycycline for your abscess Take it twice a day with food and water Use the diflucan if needed for yeast infection  Let me know if your area is not healing well or if it seems to be coming back

## 2015-06-16 NOTE — Progress Notes (Signed)
Pre visit review using our clinic review tool, if applicable. No additional management support is needed unless otherwise documented below in the visit note. 

## 2015-06-16 NOTE — Progress Notes (Signed)
[facility] [address], [address] [place], [place] [address] [phone] [phone]  Date:  06/16/2015   Name:  Jillian Reyes   DOB:  01/26/1991   MRN:  [id number]  PCP:  Danise EdgeBLYTH, STACEY, MD    Chief Complaint: Recurrent Skin Infections   History of Present Illness:  Jillian Reyes is a 25 y.o. very pleasant female patient who presents with the following:  She seems to have a recurrnet boil on her behind that has come and gone for 4-5 months.  She has been on oral antibiotics for this perhaps 3x, has used warm compressed.  Never had I and D.   She also has gotten a right groin abscess a few times recently- it was lanced and this did help it resolve for good  These abscesses are a new issue for her over the last few months.  She does not think she has had any culture done of the wound, no history of MRSA that she is aware of   No axillary abscesses  She feels well otherwise, no fever, no flu like sx LMP was 6 months ago- she had been on depo for some time and stopped getting the shots about a year ago.  Her menses have been slow to return.  However she has taken home HCG tests and does not suspect pregnancy      Patient Active Problem List   Diagnosis Date Noted  . Preventative health care 04/10/2015  . Vitamin D deficiency 04/10/2015  . Cervical paraspinal muscle spasm 03/10/2015  . Dysuria 01/10/2014  . Allergic rhinitis 11/30/2013  . Migraine without aura and without status migrainosus, not intractable 11/17/2013  . Encounter for preconception consultation 07/27/2013  . Insomnia 07/02/2013  . Tendinopathy of rotator cuff 03/08/2013  . Dyslipidemia 02/18/2013  . Contraceptive management 01/06/2012  . Eczema 01/06/2012  . Acne 01/06/2012  . Hx of gestational diabetes mellitus, not currently pregnant   . Mid back pain   . Allergic state   . Abnormal cells of cervix   . Anxiety as acute reaction to exceptional stress 05/01/2011  .  Bipolar 1 disorder (HCC) 07/05/2010  . Tobacco abuse 07/05/2010    Past Medical History  Diagnosis Date  . Bipolar 1 disorder (HCC)   . Chicken pox as a child  . Diabetes mellitus     gestational   . Back pain, lumbosacral   . Allergic state   . Abnormal cells of cervix   . Contraceptive management 01/06/2012  . Eczema 01/06/2012  . Acne 01/06/2012  . Mid back pain   . Contusion of leg 10/15/2012    right  . Dyslipidemia 02/18/2013  . Encounter for preconception consultation 07/27/2013  . Eczematous dermatitis of eyelid 09/09/2014  . Preventative health care 04/10/2015  . Vitamin D deficiency 04/10/2015    No past surgical history on file.  Social History  Substance Use Topics  . Smoking status: Current Every Day Smoker -- 1.00 packs/day    Types: Cigarettes  . Smokeless tobacco: Never Used     Comment: 1 cigarette twice a week  . Alcohol Use: No    Family History  Problem Relation Age of Onset  . Heart disease    . Diabetes Father 7435    type 2  . Thyroid disease Father   . Brain cancer Father   . Cancer Father     tumor of the brain cancer  . Hyperlipidemia  great grandparents  . Migraines Mother   . Kidney disease Mother   . Colon polyps Mother     No Known Allergies  Medication list has been reviewed and updated.  Current Outpatient Prescriptions on File Prior to Visit  Medication Sig Dispense Refill  . albuterol (PROVENTIL HFA;VENTOLIN HFA) 108 (90 BASE) MCG/ACT inhaler Inhale 2 puffs into the lungs every 6 (six) hours as needed for wheezing or shortness of breath. 1 Inhaler 0  . alprazolam (XANAX) 2 MG tablet TAKE 1 TABLET BY MOUTH AT BEDTIME FOR SLEEP/ANXIETY 30 tablet 3  . cetirizine (ZYRTEC) 10 MG tablet Take 1 tablet (10 mg total) by mouth 2 (two) times daily as needed for allergies. 30 tablet 11  . Cholecalciferol 2000 UNITS CAPS Take by mouth daily. Reported on 03/31/2015    . clobetasol cream (TEMOVATE) 0.05 % APPLY TOPICALLY DAILY AS NEEDED.  ECZEMA 60 g 1  . DULoxetine (CYMBALTA) 60 MG capsule Take 1 capsule (60 mg total) by mouth daily. 30 capsule 11  . lamoTRIgine (LAMICTAL) 150 MG tablet Take 1 tablet (150 mg total) by mouth daily. 90 tablet 1  . meloxicam (MOBIC) 7.5 MG tablet Take 1 tablet (7.5 mg total) by mouth daily. 14 tablet 0  . methocarbamol (ROBAXIN) 500 MG tablet Take 1 tablet (500 mg total) by mouth 2 (two) times daily as needed for muscle spasms. Not to be taking at the same time as flexeril. 60 tablet 1  . Norethindrone Acetate-Ethinyl Estrad-FE (LOESTRIN 24 FE) 1-20 MG-MCG(24) tablet Take 1 tablet by mouth daily. 1 Package 11  . ranitidine (ZANTAC) 150 MG tablet Take 1 tablet (150 mg total) by mouth 2 (two) times daily as needed for heartburn. 60 tablet 3  . SUMAtriptan (IMITREX) 50 MG tablet Take 1 tablet (50 mg total) by mouth once. May repeat in 2 hours if headache persists or recurs. 30 tablet 0  . traMADol (ULTRAM) 50 MG tablet 1-2 tabs by mouth Q8 hours, maximum 6 tabs per day. 40 tablet 0  . Vitamin D, Ergocalciferol, (DRISDOL) 50000 units CAPS capsule Take 1 capsule (50,000 Units total) by mouth every 7 (seven) days. 4 capsule 4  . [DISCONTINUED] naproxen (NAPROSYN) 375 MG tablet Take 1 tablet (375 mg total) by mouth 2 (two) times daily with a meal. 360 tablet 0   No current facility-administered medications on file prior to visit.    Review of Systems:  As per HPI- otherwise negative.   Physical Examination: Filed Vitals:   06/16/15 1700  BP: 110/74  Pulse: 81  Temp: 98.3 F (36.8 C)   Filed Vitals:   06/16/15 1700  Height:  (1.651 m)  Weight: 180 lb 3.2 oz (81.738 kg)   Body mass index is 29.99 kg/(m^2). Ideal Body Weight: Weight in (lb) to have BMI = 25: 149.9  GEN: WDWN, NAD, Non-toxic, A & O x 3, looks well HEENT: Atraumatic, Normocephalic. Neck supple. No masses, No LAD. Ears and Nose: No external deformity. CV: RRR, No M/G/R. No JVD. No thrill. No extra heart sounds. PULM: CTA  B, no wheezes, crackles, rhonchi. No retractions. No resp. distress. No accessory muscle use. EXTR: No c/c/e NEURO Normal gait.  PSYCH: Normally interactive. Conversant. Not depressed or anxious appearing.  Calm demeanor.  left buttock: there is a nickel sized area of tenderness and fluctuance c/w abscess on the buttock.    VC obtained.  Prepped area with betadine and applied anesthesia with approx 1ml of 2% lidocaine.  I and D  with 11 blade, pus very superficial and easily expressed.  Wound culture taken, no packing used, dressing applied   Results for orders placed or performed in visit on 06/16/15  POCT urine pregnancy  Result Value Ref Range   Preg Test, Ur Negative Negative    Assessment and Plan: Boil, buttock - Plan: Wound culture, doxycycline (VIBRAMYCIN) 100 MG capsule, fluconazole (DIFLUCAN) 150 MG tablet  Amenorrhea - Plan: POCT urine pregnancy  Here today with a recurrent boil on her buttock.  I and D today, tolerated well.  Suspect MRSA so will start doxycycline while culture is pending.  Ruled out pregnancy today as above Also discussed dilute bleach baths to help eradicate MRSA Suspect amenorrhea is due to prolonged effects of depo Discussed wound care, keeping wound covered until healed See patient instructions for more details.     Meds ordered this encounter  Medications  . doxycycline (VIBRAMYCIN) 100 MG capsule    Sig: Take 1 capsule (100 mg total) by mouth 2 (two) times daily.    Dispense:  20 capsule    Refill:  0  . fluconazole (DIFLUCAN) 150 MG tablet    Sig: Take 1 tablet (150 mg total) by mouth once. Repeat in a week if needed    Dispense:  2 tablet    Refill:  0      Signed Abbe Amsterdam, MD

## 2015-06-19 LAB — WOUND CULTURE
GRAM STAIN: NONE SEEN
GRAM STAIN: NONE SEEN
Organism ID, Bacteria: NO GROWTH

## 2015-06-21 ENCOUNTER — Ambulatory Visit: Payer: Federal, State, Local not specified - PPO | Admitting: Family Medicine

## 2015-06-22 ENCOUNTER — Encounter: Payer: Self-pay | Admitting: Sports Medicine

## 2015-06-22 ENCOUNTER — Ambulatory Visit (INDEPENDENT_AMBULATORY_CARE_PROVIDER_SITE_OTHER): Payer: Self-pay | Admitting: Sports Medicine

## 2015-06-22 VITALS — BP 110/65 | HR 76 | Resp 16 | Wt 178.8 lb

## 2015-06-22 DIAGNOSIS — M62838 Other muscle spasm: Secondary | ICD-10-CM

## 2015-06-22 DIAGNOSIS — F319 Bipolar disorder, unspecified: Secondary | ICD-10-CM

## 2015-06-22 DIAGNOSIS — M6249 Contracture of muscle, multiple sites: Secondary | ICD-10-CM

## 2015-06-22 MED ORDER — TRAMADOL HCL 50 MG PO TABS: ORAL_TABLET | ORAL | Status: DC

## 2015-06-22 NOTE — Progress Notes (Signed)
  Subjective:    CC:    Follow-up  HPI: Neck and back pain: Improved significantly with PT and medications, We also treated her depression which has resulted in good improvement in her musculoskeletal symptoms. She is having a bit of a flare in her pain, but has not yet tried her existing tramadol for this.  Past medical history, Surgical history, Family history not pertinant except as noted below, Social history, Allergies, and medications have been entered into the medical record, reviewed, and no changes needed.   Review of Systems: No fevers, chills, night sweats, weight loss, chest pain, or shortness of breath.   Objective:    General: Well Developed, well nourished, and in no acute distress.  Neuro: Alert and oriented x3, extra-ocular muscles intact, sensation grossly intact.  HEENT: Normocephalic, atraumatic, pupils equal round reactive to light, neck supple, no masses, no lymphadenopathy, thyroid nonpalpable.  Skin: Warm and dry, no rashes. Cardiac: Regular rate and rhythm, no murmurs rubs or gallops, no lower extremity edema.  Respiratory: Clear to auscultation bilaterally. Not using accessory muscles, speaking in full sentences.  Impression and Recommendations:    I spent 25 minutes with this patient, greater than 50% was face-to-face time counseling regarding the above diagnoses

## 2015-06-22 NOTE — Assessment & Plan Note (Addendum)
Mood continues to improve. No changes, continue Lamictal and Cymbalta 60.  further management per primary.

## 2015-06-22 NOTE — Assessment & Plan Note (Signed)
Overall doing well, 70% better since before we started, has occasional rough days, she will use tramadol as needed on the rough days.

## 2015-06-24 ENCOUNTER — Ambulatory Visit: Payer: Federal, State, Local not specified - PPO | Admitting: Family Medicine

## 2015-07-29 ENCOUNTER — Ambulatory Visit: Payer: Federal, State, Local not specified - PPO | Admitting: Family Medicine

## 2015-07-29 DIAGNOSIS — Z0289 Encounter for other administrative examinations: Secondary | ICD-10-CM

## 2015-08-09 ENCOUNTER — Telehealth: Payer: Self-pay | Admitting: Family Medicine

## 2015-08-09 ENCOUNTER — Encounter: Payer: Self-pay | Admitting: Family Medicine

## 2015-08-09 NOTE — Telephone Encounter (Signed)
charge 

## 2015-08-09 NOTE — Telephone Encounter (Signed)
Marked to charge and mailing no show letter °

## 2015-08-09 NOTE — Telephone Encounter (Signed)
Pt was no show 07/29/15 for follow up appt, pt has not rescheduled, 2nd no show + 4 cancellations w/in 12 months, charge or no charge?

## 2015-09-13 ENCOUNTER — Other Ambulatory Visit: Payer: Self-pay | Admitting: Family Medicine

## 2015-09-13 NOTE — Telephone Encounter (Signed)
Requesting: Alprazolam Contract   12/18/13 UDS  Moderate, due Last OV  03/31/15 Last Refill  #30 with 3 refills on 03/31/15  Please Advise

## 2015-09-13 NOTE — Telephone Encounter (Signed)
I have not seen her since January so she can have #30 of the Alprazolam but no further refills til seen

## 2015-09-13 NOTE — Telephone Encounter (Signed)
Faxed hardcopy for [facility] [place]

## 2015-09-14 ENCOUNTER — Other Ambulatory Visit: Payer: Self-pay | Admitting: Sports Medicine

## 2015-10-01 DIAGNOSIS — S70361A Insect bite (nonvenomous), right thigh, initial encounter: Secondary | ICD-10-CM | POA: Diagnosis not present

## 2015-10-13 ENCOUNTER — Other Ambulatory Visit: Payer: Self-pay | Admitting: Family Medicine

## 2015-10-13 NOTE — Telephone Encounter (Signed)
Will give pt 2 weeks of xanax. Then she can do further refills if needed. I tried to rx and print and it never did. Will you check on that. If I did not fill or did not go through will write rx for only 2 weeks. To use 2 mg po qhs. I will sign and you can fax in.

## 2015-10-13 NOTE — Telephone Encounter (Signed)
Requesting: alprazolam Contract   01/07/2014 UDS   Moderate, overdue last done January 2016 Last OV   03/31/2015 Last Refill   #30 with 0 refills on 09/13/2015  Please Advise

## 2015-10-14 MED ORDER — ALPRAZOLAM 2 MG PO TABS: 2.0000 mg | ORAL_TABLET | Freq: Every evening | ORAL | 0 refills | Status: DC | PRN

## 2015-10-14 NOTE — Telephone Encounter (Signed)
Faxed hardcopy to [facility] in [place]. 

## 2015-11-16 ENCOUNTER — Other Ambulatory Visit: Payer: Self-pay | Admitting: Medical

## 2015-11-22 ENCOUNTER — Other Ambulatory Visit: Payer: Self-pay | Admitting: Family Medicine

## 2015-11-22 NOTE — Telephone Encounter (Signed)
Relation to ZO:XWRUpt:self Call back number:[phone] Pharmacy: [id number] [place],  - [address] [phone] (Phone) [phone] (Fax)     Reason for call:  Patient requesting a refill alprazolam (XANAX) 2 MG tablet, Vitamin D, Ergocalciferol, (DRISDOL) 50000 units CAPS capsule. In addition patient states she has reoccuring vaginitis and PCO has prescribed medication and the past requesting Rx (rx name is unknown)

## 2015-11-22 NOTE — Telephone Encounter (Signed)
Last alprazolam refill was on 10/14/15  #14  0 refills Last office visit 03/31/15 Contract--01/07/14 UDS --Moderate  Last vitamin D lab done in January 2017.

## 2015-11-23 NOTE — Telephone Encounter (Signed)
Called the patient left a detailed message of provider instructions regarding refusal of medication.  Informer her in message she needs to schedule an appointment.

## 2015-11-24 NOTE — Telephone Encounter (Signed)
Patient has scheduled an appointment with PCP on 01/10/2016

## 2015-11-29 ENCOUNTER — Emergency Department (HOSPITAL_BASED_OUTPATIENT_CLINIC_OR_DEPARTMENT_OTHER)
Admission: EM | Admit: 2015-11-29 | Discharge: 2015-11-30 | Disposition: A | Discharge status: Home / Self Care | Payer: No Typology Code available for payment source | Attending: Emergency Medicine | Admitting: Emergency Medicine

## 2015-11-29 ENCOUNTER — Encounter (HOSPITAL_BASED_OUTPATIENT_CLINIC_OR_DEPARTMENT_OTHER): Payer: Self-pay | Admitting: Emergency Medicine

## 2015-11-29 DIAGNOSIS — Z79899 Other long term (current) drug therapy: Secondary | ICD-10-CM | POA: Insufficient documentation

## 2015-11-29 DIAGNOSIS — Y92410 Unspecified street and highway as the place of occurrence of the external cause: Secondary | ICD-10-CM | POA: Diagnosis not present

## 2015-11-29 DIAGNOSIS — F17210 Nicotine dependence, cigarettes, uncomplicated: Secondary | ICD-10-CM | POA: Diagnosis not present

## 2015-11-29 DIAGNOSIS — Z791 Long term (current) use of non-steroidal anti-inflammatories (NSAID): Secondary | ICD-10-CM | POA: Insufficient documentation

## 2015-11-29 DIAGNOSIS — S199XXA Unspecified injury of neck, initial encounter: Secondary | ICD-10-CM | POA: Diagnosis not present

## 2015-11-29 DIAGNOSIS — E119 Type 2 diabetes mellitus without complications: Secondary | ICD-10-CM | POA: Insufficient documentation

## 2015-11-29 DIAGNOSIS — Y939 Activity, unspecified: Secondary | ICD-10-CM | POA: Diagnosis not present

## 2015-11-29 DIAGNOSIS — Y999 Unspecified external cause status: Secondary | ICD-10-CM | POA: Diagnosis not present

## 2015-11-29 DIAGNOSIS — M542 Cervicalgia: Secondary | ICD-10-CM | POA: Diagnosis not present

## 2015-11-29 DIAGNOSIS — S46812A Strain of other muscles, fascia and tendons at shoulder and upper arm level, left arm, initial encounter: Secondary | ICD-10-CM

## 2015-11-29 DIAGNOSIS — S46912A Strain of unspecified muscle, fascia and tendon at shoulder and upper arm level, left arm, initial encounter: Secondary | ICD-10-CM | POA: Diagnosis not present
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
## 2015-11-30 ENCOUNTER — Emergency Department (HOSPITAL_BASED_OUTPATIENT_CLINIC_OR_DEPARTMENT_OTHER): Payer: No Typology Code available for payment source

## 2015-11-30 DIAGNOSIS — S199XXA Unspecified injury of neck, initial encounter: Secondary | ICD-10-CM | POA: Diagnosis not present

## 2015-11-30 DIAGNOSIS — M542 Cervicalgia: Secondary | ICD-10-CM | POA: Diagnosis not present

## 2015-11-30 MED ORDER — METHOCARBAMOL 500 MG PO TABS: 1000.0000 mg | ORAL_TABLET | Freq: Once | ORAL | Status: DC

## 2015-11-30 MED ORDER — KETOROLAC TROMETHAMINE 60 MG/2ML IM SOLN
60.0000 mg | Freq: Once | INTRAMUSCULAR | Status: AC
  Administered 2015-11-30: 60 mg via INTRAMUSCULAR
  Filled 2015-11-30: qty 2

## 2015-11-30 MED ORDER — METHOCARBAMOL 500 MG PO TABS: 1000.0000 mg | ORAL_TABLET | Freq: Three times a day (TID) | ORAL | 0 refills | Status: DC | PRN

## 2015-11-30 MED ORDER — IBUPROFEN 600 MG PO TABS: 600.0000 mg | ORAL_TABLET | Freq: Four times a day (QID) | ORAL | 0 refills | Status: DC | PRN
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
## 2015-11-30 NOTE — ED Notes (Signed)
Pt verbalizes understanding of d/c instructions and denies any further needs at this time. 

## 2015-12-02 ENCOUNTER — Telehealth: Payer: Self-pay | Admitting: Family Medicine
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
## 2015-12-04 NOTE — Telephone Encounter (Signed)
Try and find he rsomething in next 2 weeks if no appt. Let me know. If she is bad enough she may need to go to urgent care

## 2015-12-05 NOTE — Telephone Encounter (Signed)
Patient scheduled with Marcelline MatesWilliam Martin on 12/07/15 as PCP had no openings

## 2015-12-07 ENCOUNTER — Encounter: Payer: Self-pay | Admitting: Physician Assistant

## 2015-12-07 ENCOUNTER — Ambulatory Visit (INDEPENDENT_AMBULATORY_CARE_PROVIDER_SITE_OTHER): Payer: Federal, State, Local not specified - PPO | Admitting: Physician Assistant

## 2015-12-07 VITALS — BP 102/68 | HR 98 | Temp 98.6°F | Resp 16 | Ht 65.0 in | Wt 174.1 lb

## 2015-12-07 DIAGNOSIS — N910 Primary amenorrhea: Secondary | ICD-10-CM

## 2015-12-07 DIAGNOSIS — N926 Irregular menstruation, unspecified: Secondary | ICD-10-CM

## 2015-12-07 LAB — POCT URINE PREGNANCY: Preg Test, Ur: NEGATIVE

## 2015-12-07 MED ORDER — TRAMADOL HCL 50 MG PO TABS: 50.0000 mg | ORAL_TABLET | Freq: Three times a day (TID) | ORAL | 0 refills | Status: DC | PRN

## 2015-12-07 MED ORDER — CYCLOBENZAPRINE HCL 10 MG PO TABS: 10.0000 mg | ORAL_TABLET | Freq: Three times a day (TID) | ORAL | 0 refills | Status: DC | PRN

## 2015-12-07 NOTE — Patient Instructions (Signed)
Please take medications as directed. Do not drive or operate machinery while on the Flexeril. Alternate ice and heat to the areas. Avoid heavy lifting. Topical Icy Hot or Aspercreme.  Symptom should slowly improve over the next week. If not, please let us know.
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
## 2015-12-07 NOTE — Progress Notes (Signed)
Pre visit review using our clinic review tool, if applicable. No additional management support is needed unless otherwise documented below in the visit note/SLS  

## 2016-01-06 ENCOUNTER — Telehealth: Payer: Self-pay | Admitting: Family Medicine

## 2016-01-06 NOTE — Telephone Encounter (Signed)
Pt called in she had to reschedule her appt for a later date due to her being out of town. Pt would like to know if provider could provide her with a Rx until her appt.    Cyclobenzaprine  alprazolam  Please advise

## 2016-01-09 NOTE — Telephone Encounter (Signed)
Yes she can have a one month supply of both meds til seen

## 2016-01-10 ENCOUNTER — Ambulatory Visit: Payer: Federal, State, Local not specified - PPO | Admitting: Family Medicine

## 2016-01-10 MED ORDER — LAMOTRIGINE 150 MG PO TABS: 150.0000 mg | ORAL_TABLET | Freq: Every day | ORAL | 0 refills | Status: DC

## 2016-01-10 MED ORDER — VITAMIN D (ERGOCALCIFEROL) 1.25 MG (50000 UNIT) PO CAPS: 50000.0000 [IU] | ORAL_CAPSULE | ORAL | 4 refills | Status: DC

## 2016-01-10 MED ORDER — ALPRAZOLAM 2 MG PO TABS: 2.0000 mg | ORAL_TABLET | Freq: Every evening | ORAL | 0 refills | Status: DC | PRN

## 2016-01-10 MED ORDER — CYCLOBENZAPRINE HCL 10 MG PO TABS: 10.0000 mg | ORAL_TABLET | Freq: Three times a day (TID) | ORAL | 0 refills | Status: DC | PRN

## 2016-01-10 NOTE — Telephone Encounter (Signed)
Patient informed and clarified she also needed lamictal and vitamin D until appt. F[facility] [place] patient aware

## 2016-01-26 ENCOUNTER — Ambulatory Visit: Payer: Federal, State, Local not specified - PPO | Admitting: Family Medicine

## 2016-02-21 ENCOUNTER — Encounter: Payer: Self-pay | Admitting: Family Medicine

## 2016-02-21 ENCOUNTER — Ambulatory Visit: Payer: Federal, State, Local not specified - PPO | Admitting: Family Medicine

## 2016-03-16 ENCOUNTER — Encounter: Payer: Self-pay | Admitting: Family Medicine

## 2016-03-16 ENCOUNTER — Ambulatory Visit (INDEPENDENT_AMBULATORY_CARE_PROVIDER_SITE_OTHER): Payer: Federal, State, Local not specified - PPO | Admitting: Family Medicine

## 2016-03-16 VITALS — BP 110/64 | HR 89 | Temp 97.9°F | Wt 176.4 lb

## 2016-03-16 DIAGNOSIS — G4700 Insomnia, unspecified: Secondary | ICD-10-CM

## 2016-03-16 DIAGNOSIS — N879 Dysplasia of cervix uteri, unspecified: Secondary | ICD-10-CM

## 2016-03-16 DIAGNOSIS — R87619 Unspecified abnormal cytological findings in specimens from cervix uteri: Secondary | ICD-10-CM

## 2016-03-16 DIAGNOSIS — M5412 Radiculopathy, cervical region: Secondary | ICD-10-CM | POA: Insufficient documentation

## 2016-03-16 DIAGNOSIS — Z0000 Encounter for general adult medical examination without abnormal findings: Secondary | ICD-10-CM | POA: Diagnosis not present

## 2016-03-16 DIAGNOSIS — M542 Cervicalgia: Secondary | ICD-10-CM | POA: Diagnosis not present

## 2016-03-16 DIAGNOSIS — E559 Vitamin D deficiency, unspecified: Secondary | ICD-10-CM | POA: Diagnosis not present

## 2016-03-16 DIAGNOSIS — F319 Bipolar disorder, unspecified: Secondary | ICD-10-CM

## 2016-03-16 LAB — VITAMIN D 25 HYDROXY (VIT D DEFICIENCY, FRACTURES): VITD: 18.02 ng/mL — AB (ref 30.00–100.00)

## 2016-03-16 MED ORDER — ALPRAZOLAM 2 MG PO TABS: 2.0000 mg | ORAL_TABLET | Freq: Every evening | ORAL | 0 refills | Status: DC | PRN

## 2016-03-16 MED ORDER — LAMOTRIGINE 150 MG PO TABS: 150.0000 mg | ORAL_TABLET | Freq: Every day | ORAL | 0 refills | Status: DC

## 2016-03-16 MED ORDER — ALPRAZOLAM 2 MG PO TABS: 2.0000 mg | ORAL_TABLET | Freq: Every evening | ORAL | 5 refills | Status: DC | PRN

## 2016-03-16 MED ORDER — DULOXETINE HCL 60 MG PO CPEP: 60.0000 mg | ORAL_CAPSULE | Freq: Every day | ORAL | 11 refills | Status: DC

## 2016-03-16 MED ORDER — ALBUTEROL SULFATE HFA 108 (90 BASE) MCG/ACT IN AERS: 2.0000 | INHALATION_SPRAY | Freq: Four times a day (QID) | RESPIRATORY_TRACT | 0 refills | Status: DC | PRN

## 2016-03-16 NOTE — Assessment & Plan Note (Signed)
Using Alprazolam prn infrequently. No further concerns.
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
## 2016-03-16 NOTE — Assessment & Plan Note (Signed)
Check level today, not taking supplements 

## 2016-03-16 NOTE — Assessment & Plan Note (Signed)
Doing well on current meds.

## 2016-03-16 NOTE — Assessment & Plan Note (Signed)
Encouraged to follow up with GYN for paps or to have us do it. Patient noncommittal

## 2016-03-16 NOTE — Assessment & Plan Note (Signed)
Encouraged moist heat and gentle stretching as tolerated. May try NSAIDs and prescription meds as directed and report if symptoms worsen or seek immediate care. Encouraged Lidocaine patches and Tylenol. Can continue Chiropractic and is referred to PT for further care

## 2016-03-16 NOTE — Progress Notes (Signed)
Pre visit review using our clinic review tool, if applicable. No additional management support is needed unless otherwise documented below in the visit note. 

## 2016-03-16 NOTE — Progress Notes (Signed)
Patient ID: Jillian Reyes, female   DOB: 06/14/1990, 25 y.o.   MRN: [id number]

## 2016-03-16 NOTE — Patient Instructions (Signed)
Neck Pain Adult Back pain is very common in adults.The cause of back pain is rarely dangerous and the pain often gets better over time.The cause of your back pain may not be known. Some common causes of back pain include:  Strain of the muscles or ligaments supporting the spine.  Wear and tear (degeneration) of the spinal disks.  Arthritis.  Direct injury to the back. For many people, back pain may return. Since back pain is rarely dangerous, most people can learn to manage this condition on their own. Follow these instructions at home: Watch your back pain for any changes. The following actions may help to lessen any discomfort you are feeling:  Remain active. It is stressful on your back to sit or stand in one place for long periods of time. Do not sit, drive, or stand in one place for more than 30 minutes at a time. Take short walks on even surfaces as soon as you are able.Try to increase the length of time you walk each day.  Exercise regularly as directed by your health care provider. Exercise helps your back heal faster. It also helps avoid future injury by keeping your muscles strong and flexible.  Do not stay in bed.Resting more than 1-2 days can delay your recovery.  Pay attention to your body when you bend and lift. The most comfortable positions are those that put less stress on your recovering back. Always use proper lifting techniques, including:  Bending your knees.  Keeping the load close to your body.  Avoiding twisting.  Find a comfortable position to sleep. Use a firm mattress and lie on your side with your knees slightly bent. If you lie on your back, put a pillow under your knees.  Avoid feeling anxious or stressed.Stress increases muscle tension and can worsen back pain.It is important to recognize when you are anxious or stressed and learn ways to manage it, such as with exercise.  Take medicines only as directed by your health care provider.  Over-the-counter medicines to reduce pain and inflammation are often the most helpful.Your health care provider may prescribe muscle relaxant drugs.These medicines help dull your pain so you can more quickly return to your normal activities and healthy exercise.  Apply ice to the injured area:  Put ice in a plastic bag.  Place a towel between your skin and the bag.  Leave the ice on for 20 minutes, 2-3 times a day for the first 2-3 days. After that, ice and heat may be alternated to reduce pain and spasms.  Maintain a healthy weight. Excess weight puts extra stress on your back and makes it difficult to maintain good posture. Contact a health care provider if:  You have pain that is not relieved with rest or medicine.  You have increasing pain going down into the legs or buttocks.  You have pain that does not improve in one week.  You have night pain.  You lose weight.  You have a fever or chills. Get help right away if:  You develop new bowel or bladder control problems.  You have unusual weakness or numbness in your arms or legs.  You develop nausea or vomiting.  You develop abdominal pain.  You feel faint. This information is not intended to replace advice given to you by your health care provider. Make sure you discuss any questions you have with your health care provider. Document Released: 03/05/2005 Document Revised: 07/14/2015 Document Reviewed: 07/07/2013 Elsevier Interactive Patient Education  2017 Elsevier  Inc.  

## 2016-04-02 DIAGNOSIS — K080 Exfoliation of teeth due to systemic causes: Secondary | ICD-10-CM | POA: Diagnosis not present

## 2016-04-10 ENCOUNTER — Ambulatory Visit: Payer: Federal, State, Local not specified - PPO | Admitting: Family Medicine

## 2016-04-10 ENCOUNTER — Encounter: Payer: Self-pay | Admitting: Family Medicine

## 2016-04-10 ENCOUNTER — Ambulatory Visit (INDEPENDENT_AMBULATORY_CARE_PROVIDER_SITE_OTHER): Payer: Federal, State, Local not specified - PPO | Admitting: Family Medicine

## 2016-04-10 DIAGNOSIS — S93402A Sprain of unspecified ligament of left ankle, initial encounter: Secondary | ICD-10-CM | POA: Diagnosis not present

## 2016-04-10 DIAGNOSIS — L739 Follicular disorder, unspecified: Secondary | ICD-10-CM | POA: Insufficient documentation

## 2016-04-10 MED ORDER — CEPHALEXIN 500 MG PO CAPS: 500.0000 mg | ORAL_CAPSULE | Freq: Four times a day (QID) | ORAL | 0 refills | Status: DC

## 2016-04-10 NOTE — Patient Instructions (Addendum)
Use Cetaphil soap. Use [employer] Astringent.  Apply ice to ankle twice a day, atopical ointments (i.e. aspercreme or solanpaz). Your also encouraged to use a daily probiotic. Folliculitis Folliculitis is redness, soreness, and swelling (inflammation) of the hair follicles. This condition can occur anywhere on the body. People with weakened immune systems, diabetes, or obesity have a greater risk of getting folliculitis. CAUSES  Bacterial infection. This is the most common cause.  Fungal infection.  Viral infection.  Contact with certain chemicals, especially oils and tars. Long-term folliculitis can result from bacteria that live in the nostrils. The bacteria may trigger multiple outbreaks of folliculitis over time. SYMPTOMS Folliculitis most commonly occurs on the scalp, thighs, legs, back, buttocks, and areas where hair is shaved frequently. An early sign of folliculitis is a small, white or yellow, pus-filled, itchy lesion (pustule). These lesions appear on a red, inflamed follicle. They are usually less than 0.2 inches (5 mm) wide. When there is an infection of the follicle that goes deeper, it becomes a boil or furuncle. A group of closely packed boils creates a larger lesion (carbuncle). Carbuncles tend to occur in hairy, sweaty areas of the body. DIAGNOSIS  Your caregiver can usually tell what is wrong by doing a physical exam. A sample may be taken from one of the lesions and tested in a lab. This can help determine what is causing your folliculitis. TREATMENT  Treatment may include:  Applying warm compresses to the affected areas.  Taking antibiotic medicines orally or applying them to the skin.  Draining the lesions if they contain a large amount of pus or fluid.  Laser hair removal for cases of long-lasting folliculitis. This helps to prevent regrowth of the hair. HOME CARE INSTRUCTIONS  Apply warm compresses to the affected areas as directed by your caregiver.  If  antibiotics are prescribed, take them as directed. Finish them even if you start to feel better.  You may take over-the-counter medicines to relieve itching.  Do not shave irritated skin.  Follow up with your caregiver as directed. SEEK IMMEDIATE MEDICAL CARE IF:   You have increasing redness, swelling, or pain in the affected area.  You have a fever. MAKE SURE YOU:  Understand these instructions.  Will watch your condition.  Will get help right away if you are not doing well or get worse. This information is not intended to replace advice given to you by your health care provider. Make sure you discuss any questions you have with your health care provider. Document Released: 05/14/2001 Document Revised: 03/26/2014 Document Reviewed: 12/24/2014 Elsevier Interactive Patient Education  2017 [employer]

## 2016-04-10 NOTE — Assessment & Plan Note (Signed)
Right buttock. Add keflex tid x 10 days. Use Cetaphil soap, witch hazel astringent and Benzoyl peroxide topically

## 2016-04-10 NOTE — Progress Notes (Signed)
Subjective:    Patient ID: Jillian Reyes, female    DOB: 02/07/1991, 26 y.o.   MRN: [id number]  Chief Complaint  Patient presents with  . Abscess    x7 days. states that the abscess is red and hot to the touch.    HPI Patient is in today for abscess on right buttock for the past seven days. Patient states that it is red and hot to the touch.. No fevers, myalgias, malaise. The lesion is similar to lesions she has had to have lanced in the past. Also notes 2 months of ankle pain s/p an inversion injury. Improving slowly. Is noting some swelling over lateral ankle no bony tenderness. Able to bare weight. Denies CP/palp/SOB/HA/congestion/fevers/GI or GU c/o. Taking meds as prescribed  Past Medical History:  Diagnosis Date  . Abnormal cells of cervix   . Acne 01/06/2012  . Allergic state   . Back pain, lumbosacral   . Bilateral neck pain 03/16/2016  . Bipolar 1 disorder (HCC)   . Chicken pox as a child  . Contraceptive management 01/06/2012  . Contusion of leg 10/15/2012   right  . Diabetes mellitus    gestational   . Dyslipidemia 02/18/2013  . Eczema 01/06/2012  . Eczematous dermatitis of eyelid 09/09/2014  . Encounter for preconception consultation 07/27/2013  . Folliculitis 04/10/2016  . Left ankle sprain 04/10/2016  . Mid back pain   . Preventative health care 04/10/2015  . Vitamin D deficiency 04/10/2015    History reviewed. No pertinent surgical history.  Family History  Problem Relation Age of Onset  . Heart disease    . Diabetes Father 6835    type 2  . Thyroid disease Father   . Brain cancer Father   . Cancer Father     tumor of the brain cancer  . Migraines Mother   . Kidney disease Mother   . Colon polyps Mother   . Hyperlipidemia      great grandparents    Social History   Social History  . Marital status: Single    Spouse name: N/A  . Number of children: 1  . Years of education: N/A   Occupational History  . clerck      [employer]   Social History Main  Topics  . Smoking status: Current Every Day Smoker    Packs/day: 1.00    Types: Cigarettes  . Smokeless tobacco: Never Used     Comment: 1 cigarette twice a week  . Alcohol use No  . Drug use: No  . 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 Not on file   Social History Narrative   Has a son. Living with her mother, stepfather, and brother and sister.     2 caffeinated drinks daily.        Outpatient Medications Prior to Visit  Medication Sig Dispense Refill  . albuterol (PROVENTIL HFA;VENTOLIN HFA) 108 (90 Base) MCG/ACT inhaler Inhale 2 puffs into the lungs every 6 (six) hours as needed for wheezing or shortness of breath. 1 Inhaler 0  . alprazolam (XANAX) 2 MG tablet Take 1 tablet (2 mg total) by mouth at bedtime as needed for sleep. 30 tablet 5  . cetirizine (ZYRTEC) 10 MG tablet Take 1 tablet (10 mg total) by mouth 2 (two) times daily as  needed for allergies. 30 tablet 11  . Cholecalciferol 2000 UNITS CAPS Take by mouth daily. Reported on 03/31/2015    . clobetasol cream (TEMOVATE) 0.05 % APPLY TOPICALLY DAILY AS NEEDED. ECZEMA 60 g 1  . cyclobenzaprine (FLEXERIL) 10 MG tablet Take 1 tablet (10 mg total) by mouth 3 (three) times daily as needed for muscle spasms. 30 tablet 0  . DULoxetine (CYMBALTA) 60 MG capsule Take 1 capsule (60 mg total) by mouth daily. 30 capsule 11  . ibuprofen (ADVIL,MOTRIN) 600 MG tablet Take 1 tablet (600 mg total) by mouth every 6 (six) hours as needed. 30 tablet 0  . lamoTRIgine (LAMICTAL) 150 MG tablet Take 1 tablet (150 mg total) by mouth daily. 30 tablet 0  . ranitidine (ZANTAC) 150 MG tablet Take 1 tablet (150 mg total) by mouth 2 (two) times daily as needed for heartburn. 60 tablet 3  . SUMAtriptan (IMITREX) 50 MG tablet Take 1 tablet (50 mg total) by mouth once. May repeat in 2 hours if headache  persists or recurs. 30 tablet 0  . traMADol (ULTRAM) 50 MG tablet Take 1 tablet (50 mg total) by mouth every 8 (eight) hours as needed. 15 tablet 0   No facility-administered medications prior to visit.     No Known Allergies  Review of Systems  Constitutional: Negative for fever and malaise/fatigue.  HENT: Negative for congestion.   Eyes: Negative for blurred vision.  Respiratory: Negative for cough and shortness of breath.   Cardiovascular: Negative for chest pain, palpitations and leg swelling.  Gastrointestinal: Negative for vomiting.  Musculoskeletal: Positive for joint pain. Negative for back pain.  Skin: Positive for rash.  Neurological: Negative for loss of consciousness and headaches.       Objective:    Physical Exam  Constitutional: She is oriented to person, place, and time. She appears well-developed and well-nourished. No distress.  HENT:  Head: Normocephalic and atraumatic.  Eyes: Conjunctivae are normal.  Neck: Normal range of motion. No thyromegaly present.  Cardiovascular: Normal rate and regular rhythm.   Pulmonary/Chest: Effort normal and breath sounds normal. She has no wheezes.  Abdominal: Soft. Bowel sounds are normal. There is no tenderness.  Musculoskeletal: She exhibits edema and tenderness. She exhibits no deformity.  Mild edema and tenderness over lateral malleolus on left. FROM but pain with all movement  Neurological: She is alert and oriented to person, place, and time.  Skin: Skin is warm and dry. She is not diaphoretic. There is erythema.  Small follicular lesion on right buttock no surrounding fluctuance  Psychiatric: She has a normal mood and affect.    BP 108/60 (BP Location: Left Arm, Patient Position: Sitting, Cuff Size: Normal)   Pulse 87   Temp 98.1 F (36.7 C) (Oral)   Wt 174 lb (78.9 kg)   SpO2 97% Comment: RA  BMI 28.96 kg/m  Wt Readings from Last 3 Encounters:  04/10/16 174 lb (78.9 kg)  03/16/16 176 lb 6.4 oz (80 kg)    12/07/15 174 lb 2 oz (79 kg)     Lab Results  Component Value Date   WBC 7.6 03/31/2015   HGB 15.1 (H) 03/31/2015   HCT 45.0 03/31/2015   PLT 220.0 03/31/2015   GLUCOSE 87 03/31/2015   CHOL 152 03/31/2015   TRIG 120.0 03/31/2015   HDL 40.80 03/31/2015   LDLCALC 87 03/31/2015   ALT 25 03/31/2015   AST 22 03/31/2015   NA 137 03/31/2015   K 3.7 03/31/2015   CL  103 03/31/2015   CREATININE 0.84 03/31/2015   BUN 10 03/31/2015   CO2 26 03/31/2015   TSH 0.79 03/31/2015   MICROALBUR 0.8 03/31/2015    Lab Results  Component Value Date   TSH 0.79 03/31/2015   Lab Results  Component Value Date   WBC 7.6 03/31/2015   HGB 15.1 (H) 03/31/2015   HCT 45.0 03/31/2015   MCV 91.3 03/31/2015   PLT 220.0 03/31/2015   Lab Results  Component Value Date   NA 137 03/31/2015   K 3.7 03/31/2015   CO2 26 03/31/2015   GLUCOSE 87 03/31/2015   BUN 10 03/31/2015   CREATININE 0.84 03/31/2015   BILITOT 0.6 03/31/2015   ALKPHOS 60 03/31/2015   AST 22 03/31/2015   ALT 25 03/31/2015   PROT 6.8 03/31/2015   ALBUMIN 4.3 03/31/2015   CALCIUM 9.3 03/31/2015   ANIONGAP 2 (L) 05/12/2014   GFR 87.49 03/31/2015   Lab Results  Component Value Date   CHOL 152 03/31/2015   Lab Results  Component Value Date   HDL 40.80 03/31/2015   Lab Results  Component Value Date   LDLCALC 87 03/31/2015   Lab Results  Component Value Date   TRIG 120.0 03/31/2015   Lab Results  Component Value Date   CHOLHDL 4 03/31/2015   No results found for: HGBA1C    I acted as a Neurosurgeon for Dr. Abner Greenspan. Diamond Nickel, RMA  Assessment & Plan:   Problem List Items Addressed This Visit    Left ankle sprain    Wrap, ice, elevate topical nsaid's. Exercises and report worsening       Folliculitis    Right buttock. Add keflex tid x 10 days. Use Cetaphil soap, witch hazel astringent and Benzoyl peroxide topically         I am having Ms. Reyes start on cephALEXin. I am also having her maintain her SUMAtriptan,  ranitidine, cetirizine, Cholecalciferol, clobetasol cream, ibuprofen, traMADol, cyclobenzaprine, albuterol, lamoTRIgine, DULoxetine, and alprazolam.  Meds ordered this encounter  Medications  . cephALEXin (KEFLEX) 500 MG capsule    Sig: Take 1 capsule (500 mg total) by mouth 4 (four) times daily.    Dispense:  30 capsule    Refill:  0    CMA served as scribe during this visit. History, Physical and Plan performed by medical provider. Documentation and orders reviewed and attested to.  Danise Edge, MD

## 2016-04-10 NOTE — Assessment & Plan Note (Signed)
Wrap, ice, elevate topical nsaid's. Exercises and report worsening

## 2016-04-10 NOTE — Progress Notes (Signed)
Patient ID: Jillian Reyes, female   DOB: 09/30/1990, 25 y.o.   MRN: [id number]  

## 2016-04-10 NOTE — Progress Notes (Signed)
Pre visit review using our clinic review tool, if applicable. No additional management support is needed unless otherwise documented below in the visit note. 

## 2016-04-25 ENCOUNTER — Ambulatory Visit: Payer: Federal, State, Local not specified - PPO | Admitting: Medical

## 2016-05-01 ENCOUNTER — Ambulatory Visit (INDEPENDENT_AMBULATORY_CARE_PROVIDER_SITE_OTHER): Payer: Federal, State, Local not specified - PPO | Admitting: Family Medicine

## 2016-05-01 DIAGNOSIS — R109 Unspecified abdominal pain: Secondary | ICD-10-CM

## 2016-05-01 NOTE — Progress Notes (Signed)
Pre visit review using our clinic review tool, if applicable. No additional management support is needed unless otherwise documented below in the visit note. 

## 2016-05-02 ENCOUNTER — Encounter: Payer: Self-pay | Admitting: Family Medicine

## 2016-05-02 ENCOUNTER — Ambulatory Visit (INDEPENDENT_AMBULATORY_CARE_PROVIDER_SITE_OTHER): Payer: Federal, State, Local not specified - PPO | Admitting: Family Medicine

## 2016-05-02 ENCOUNTER — Ambulatory Visit (HOSPITAL_BASED_OUTPATIENT_CLINIC_OR_DEPARTMENT_OTHER)
Admission: RE | Admit: 2016-05-02 | Discharge: 2016-05-02 | Disposition: A | Discharge status: Home / Self Care | Payer: Federal, State, Local not specified - PPO | Source: Ambulatory Visit | Attending: Family Medicine | Admitting: Family Medicine

## 2016-05-02 VITALS — BP 90/50 | HR 91 | Temp 98.2°F | Ht 65.0 in | Wt 175.8 lb

## 2016-05-02 DIAGNOSIS — R1030 Lower abdominal pain, unspecified: Secondary | ICD-10-CM | POA: Diagnosis not present

## 2016-05-02 DIAGNOSIS — M4186 Other forms of scoliosis, lumbar region: Secondary | ICD-10-CM | POA: Insufficient documentation

## 2016-05-02 DIAGNOSIS — K5900 Constipation, unspecified: Secondary | ICD-10-CM | POA: Diagnosis not present

## 2016-05-02 LAB — POC URINALSYSI DIPSTICK (AUTOMATED)
Bilirubin, UA: NEGATIVE
Glucose, UA: NEGATIVE
KETONES UA: NEGATIVE
Leukocytes, UA: NEGATIVE
Nitrite, UA: NEGATIVE
PH UA: 6
PROTEIN UA: NEGATIVE
RBC UA: NEGATIVE
UROBILINOGEN UA: 0.2

## 2016-05-02 LAB — POCT URINE PREGNANCY: PREG TEST UR: NEGATIVE

## 2016-05-02 NOTE — Patient Instructions (Signed)
Miralax 1-2 times daily for the next 3 days. If you are still having issues, try taking an enema.

## 2016-05-02 NOTE — Progress Notes (Signed)
Chief Complaint  Patient presents with  . Abdominal Pain    lower-along with bloating-x 2 weeks    Jillian Reyes is here for abdominal pain.  Duration: 2 weeks  Bleeding? No Weight loss? No Palliation: Pepto-Bismol and not eating Provocation: Eating Associated symptoms: intermittent nausea, bloating, loose stools, described as darker than usual Denies: fever, vomiting, urinary complaints, constipation and injury Treatment to date: Pepto Bismol which has been somewhat helpful  ROS: Constitutional: No fevers GI: No V/D/C, no bleeding + pain  Past Medical History:  Diagnosis Date  . Abnormal cells of cervix   . Acne 01/06/2012  . Allergic state   . Back pain, lumbosacral   . Bilateral neck pain 03/16/2016  . Bipolar 1 disorder (HCC)   . Chicken pox as a child  . Contraceptive management 01/06/2012  . Contusion of leg 10/15/2012   right  . Diabetes mellitus    gestational   . Dyslipidemia 02/18/2013  . Eczema 01/06/2012  . Eczematous dermatitis of eyelid 09/09/2014  . Encounter for preconception consultation 07/27/2013  . Folliculitis 04/10/2016  . Left ankle sprain 04/10/2016  . Mid back pain   . Vitamin D deficiency 04/10/2015   Family History  Problem Relation Age of Onset  . Heart disease    . Diabetes Father 4835    type 2  . Thyroid disease Father   . Brain cancer Father   . Cancer Father     tumor of the brain cancer  . Migraines Mother   . Kidney disease Mother   . Colon polyps Mother   . Hyperlipidemia      great grandparents   Past Surgical History:  Procedure Laterality Date  . NO PAST SURGERIES      BP (!) 90/50 (BP Location: Left Arm, Patient Position: Sitting, Cuff Size: Normal)   Pulse 91   Temp 98.2 F (36.8 C) (Oral)   Ht 5\' 5"  (1.651 m)   Wt 175 lb 12.8 oz (79.7 kg)   LMP 03/23/2016   SpO2 99%   BMI 29.25 kg/m  Gen.: Awake, alert, appears stated age HEENT: Mucous membranes moist without mucosal lesions Heart: Regular rate and rhythm  without murmurs Lungs: Clear auscultation bilaterally, no rales or wheezing, normal effort without accessory muscle use. Abdomen: Bowel sounds are present. Abdomen is soft, TTP in the lower quadrants, nondistended, no masses or organomegaly. Negative Murphy's, Rovsing's, McBurney's, and Carnett's sign. Psych: Age appropriate judgment and insight. Normal mood and affect.  Lower abdominal pain - Plan: POCT Urinalysis Dipstick (Automated), POCT urine pregnancy, DG Abd 2 Views  Urine studies neg. XR shows moderate amount of stool and some gas. This is likely the cause of her pain. Reviewed imaging with patient.  Miralax 1-2 times daily for 3 days, enema if no improvement.  Dark stools likely 2/2 to OTC tx. F/u prn. If no improvement, would get transvag US and consider Bentyl, stool studies. Pt voiced understanding and agreement to the plan.  Jilda Rocheicholas Paul [place], DO 05/02/16 11:47 AM

## 2016-05-02 NOTE — Progress Notes (Signed)
Pre visit review using our clinic review tool, if applicable. No additional management support is needed unless otherwise documented below in the visit note. 

## 2016-05-07 NOTE — Progress Notes (Signed)
Patient ID: Jillian Reyes, female   DOB: 11/11/1990, 26 y.o.   MRN: [id number] Patient did not keep appointment

## 2016-05-30 ENCOUNTER — Other Ambulatory Visit: Payer: Self-pay | Admitting: Family Medicine

## 2016-05-31 ENCOUNTER — Encounter: Payer: Self-pay | Admitting: Family Medicine

## 2016-05-31 ENCOUNTER — Ambulatory Visit (HOSPITAL_BASED_OUTPATIENT_CLINIC_OR_DEPARTMENT_OTHER)
Admission: RE | Admit: 2016-05-31 | Discharge: 2016-05-31 | Disposition: A | Discharge status: Home / Self Care | Payer: Federal, State, Local not specified - PPO | Source: Ambulatory Visit | Attending: Family Medicine | Admitting: Family Medicine

## 2016-05-31 ENCOUNTER — Ambulatory Visit (INDEPENDENT_AMBULATORY_CARE_PROVIDER_SITE_OTHER): Payer: Federal, State, Local not specified - PPO | Admitting: Family Medicine

## 2016-05-31 ENCOUNTER — Encounter (HOSPITAL_BASED_OUTPATIENT_CLINIC_OR_DEPARTMENT_OTHER): Payer: Self-pay

## 2016-05-31 VITALS — BP 98/68 | HR 89 | Temp 98.0°F | Resp 18 | Wt 177.4 lb

## 2016-05-31 DIAGNOSIS — Z720 Tobacco use: Secondary | ICD-10-CM | POA: Diagnosis not present

## 2016-05-31 DIAGNOSIS — M542 Cervicalgia: Secondary | ICD-10-CM

## 2016-05-31 DIAGNOSIS — M545 Low back pain: Secondary | ICD-10-CM | POA: Diagnosis not present

## 2016-05-31 DIAGNOSIS — M549 Dorsalgia, unspecified: Secondary | ICD-10-CM | POA: Diagnosis not present

## 2016-05-31 MED ORDER — TIZANIDINE HCL 4 MG PO TABS: 4.0000 mg | ORAL_TABLET | Freq: Two times a day (BID) | ORAL | 1 refills | Status: DC
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
## 2016-05-31 NOTE — Progress Notes (Signed)
Pre visit review using our clinic review tool, if applicable. No additional management support is needed unless otherwise documented below in the visit note. 

## 2016-05-31 NOTE — Patient Instructions (Signed)
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
## 2016-06-03 NOTE — Assessment & Plan Note (Signed)
Encouraged moist heat and gentle stretching as tolerated. May try NSAIDs and prescription meds as directed and report if symptoms worsen or seek immediate care. Try topical treatments 

## 2016-06-03 NOTE — Assessment & Plan Note (Signed)
Cessation would help back pain in the long run.

## 2016-06-27 DIAGNOSIS — J3089 Other allergic rhinitis: Secondary | ICD-10-CM | POA: Diagnosis not present

## 2016-06-27 DIAGNOSIS — R05 Cough: Secondary | ICD-10-CM | POA: Diagnosis not present

## 2016-06-27 DIAGNOSIS — H1045 Other chronic allergic conjunctivitis: Secondary | ICD-10-CM | POA: Diagnosis not present

## 2016-07-12 DIAGNOSIS — J3089 Other allergic rhinitis: Secondary | ICD-10-CM | POA: Diagnosis not present

## 2016-07-16 DIAGNOSIS — R05 Cough: Secondary | ICD-10-CM | POA: Diagnosis not present

## 2016-07-16 DIAGNOSIS — J018 Other acute sinusitis: Secondary | ICD-10-CM | POA: Diagnosis not present

## 2016-07-16 DIAGNOSIS — B373 Candidiasis of vulva and vagina: Secondary | ICD-10-CM | POA: Diagnosis not present

## 2016-07-16 DIAGNOSIS — H66002 Acute suppurative otitis media without spontaneous rupture of ear drum, left ear: Secondary | ICD-10-CM | POA: Diagnosis not present

## 2016-08-03 ENCOUNTER — Other Ambulatory Visit: Payer: Self-pay | Admitting: Family Medicine

## 2016-08-16 ENCOUNTER — Encounter: Payer: Self-pay | Admitting: Internal Medicine

## 2016-08-16 ENCOUNTER — Ambulatory Visit (INDEPENDENT_AMBULATORY_CARE_PROVIDER_SITE_OTHER): Payer: Federal, State, Local not specified - PPO | Admitting: Internal Medicine

## 2016-08-16 ENCOUNTER — Telehealth: Payer: Self-pay

## 2016-08-16 VITALS — BP 124/68 | HR 96 | Temp 98.6°F | Resp 14 | Ht 65.0 in | Wt 178.2 lb

## 2016-08-16 DIAGNOSIS — R21 Rash and other nonspecific skin eruption: Secondary | ICD-10-CM

## 2016-08-16 DIAGNOSIS — L509 Urticaria, unspecified: Secondary | ICD-10-CM

## 2016-08-16 DIAGNOSIS — R059 Cough, unspecified: Secondary | ICD-10-CM

## 2016-08-16 DIAGNOSIS — L709 Acne, unspecified: Secondary | ICD-10-CM

## 2016-08-16 DIAGNOSIS — R05 Cough: Secondary | ICD-10-CM

## 2016-08-16 MED ORDER — CLOBETASOL PROPIONATE 0.05 % EX CREA: TOPICAL_CREAM | CUTANEOUS | 0 refills | Status: DC

## 2016-08-16 MED ORDER — PREDNISONE 10 MG PO TABS: ORAL_TABLET | ORAL | 0 refills | Status: DC

## 2016-08-16 NOTE — Patient Instructions (Signed)
Take  prednisone as prescribed  Use the cream twice a day as needed  Take OTC Zyrtec or Claritin daily. Don't take both.  Okay to use OTC calamine if needed

## 2016-08-16 NOTE — Progress Notes (Signed)
Pre visit review using our clinic review tool, if applicable. No additional management support is needed unless otherwise documented below in the visit note. 

## 2016-08-16 NOTE — Telephone Encounter (Signed)
Pt requesting all medications be refilled to [facility] [address], [facility] [place].

## 2016-08-16 NOTE — Progress Notes (Signed)
Subjective:    Patient ID: Jillian Reyes, female    DOB: 04/11/1990, 26 y.o.   MRN: [id number]  DOS:  08/16/2016 Type of visit - description : Acute visit Interval history: Several days history of a itchy rash mostly on the upper extremities and shoulders . Symptoms started after last weekend when she was outdoors and exposed to the environmental component. Also, has noted some dry cough for the last few days. Does not feel like she has a URI, simply has cough.  Review of Systems  No fever chills No whelps No lip or tongue swelling No sinus congestion or sputum production.  Past Medical History:  Diagnosis Date  . Abnormal cells of cervix   . Acne 01/06/2012  . Allergic state   . Back pain, lumbosacral   . Bilateral neck pain 03/16/2016  . Bipolar 1 disorder (HCC)   . Chicken pox as a child  . Contraceptive management 01/06/2012  . Contusion of leg 10/15/2012   right  . Diabetes mellitus    gestational   . Dyslipidemia 02/18/2013  . Eczema 01/06/2012  . Eczematous dermatitis of eyelid 09/09/2014  . Encounter for preconception consultation 07/27/2013  . Folliculitis 04/10/2016  . Left ankle sprain 04/10/2016  . Mid back pain   . Vitamin D deficiency 04/10/2015    Past Surgical History:  Procedure Laterality Date  . NO PAST SURGERIES      Social History   Social History  . Marital status: Single    Spouse name: N/A  . Number of children: 1  . Years of education: N/A   Occupational History  . clerck      [employer]   Social History Main Topics  . Smoking status: Current Every Day Smoker    Packs/day: 1.00    Types: Cigarettes  . Smokeless tobacco: Never Used     Comment: 1 cigarette twice a week  . Alcohol use No  . Drug use: No  . 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 Not on file   Social History Narrative   Has a son. Living with her mother, stepfather, and brother and sister.     2 caffeinated drinks daily.          Allergies as of 08/16/2016   No Known Allergies     Medication List       Accurate as of 08/16/16 11:59 PM. Always use your most recent med list.          albuterol 108 (90 Base) MCG/ACT inhaler Commonly known as:  PROVENTIL HFA;VENTOLIN HFA Inhale 2 puffs into the lungs every 6 (six) hours as needed for wheezing or shortness of breath.   alprazolam 2 MG tablet Commonly known as:  XANAX Take 1 tablet (2 mg total) by mouth at bedtime as needed for sleep.   cetirizine 10 MG tablet Commonly known as:  ZYRTEC Take 1 tablet (10 mg total) by mouth 2 (two) times daily as needed for allergies.   Cholecalciferol 2000 units Caps Take by mouth daily. Reported on 03/31/2015   clobetasol cream 0.05 % Commonly known as:  TEMOVATE APPLY TOPICALLY DAILY AS NEEDED. ECZEMA   DULoxetine 60 MG capsule Commonly known as:  CYMBALTA Take 1 capsule (60 mg total) by mouth daily.   lamoTRIgine 150 MG tablet Commonly  known as:  LAMICTAL TAKE 1 TABLET (150 MG TOTAL) BY MOUTH DAILY.   predniSONE 10 MG tablet Commonly known as:  DELTASONE 4 tablets x 2 days, 3 tabs x 2 days, 2 tabs x 2 days, 1 tab x 2 days   ranitidine 150 MG tablet Commonly known as:  ZANTAC Take 1 tablet (150 mg total) by mouth 2 (two) times daily as needed for heartburn.   SUMAtriptan 50 MG tablet Commonly known as:  IMITREX Take 1 tablet (50 mg total) by mouth once. May repeat in 2 hours if headache persists or recurs.   tiZANidine 4 MG tablet Commonly known as:  ZANAFLEX Take 1 tablet (4 mg total) by mouth 2 (two) times daily.   traMADol 50 MG tablet Commonly known as:  ULTRAM Take 1 tablet (50 mg total) by mouth every 8 (eight) hours as needed.          Objective:   Physical Exam  Skin:      BP 124/68 (BP Location: Left Arm, Patient Position: Sitting, Cuff Size: Normal)   Pulse 96   Temp  98.6 F (37 C) (Oral)   Resp 14   Ht 5\' 5"  (1.651 m)   Wt 178 lb 4 oz (80.9 kg)   LMP 07/26/2016 (Exact Date)   SpO2 96%   BMI 29.66 kg/m  General:   Well developed, well nourished . NAD.  HEENT:  Normocephalic . Face symmetric, atraumatic Lungs:  Few rhonchi with cough. Normal respiratory effort, no intercostal retractions, no accessory muscle use. Heart: RRR,  no murmur.  No pretibial edema bilaterally  Skin: Not pale. Not jaundice Neurologic:  alert & oriented X3.  Speech normal, gait appropriate for age and unassisted Psych--  Cognition and judgment appear intact.  Cooperative with normal attention span and concentration.  Behavior appropriate. No anxious or depressed appearing.      Assessment & Plan:    26 year old female with history of acne, allergies/asthma, HAs, bipolar disorder, gestational diabetes, not on BCP (LMP 3 w ago, husband had a vasectomy),  dyslipidemia, eczema, presents with the following: Rash: Likely atopic. Will recommend round of steroids, OTC antihistaminics and refill her steroid cream. Call if no better Cough: Cough w/o URI symptoms, history of asthma. Recommend Mucinex, albuterol when necessary even if no wheezing, okay to use for persisting cough which may be a sign of asthma. She is getting steroids, that likely will help as well. Call if no better.

## 2016-08-16 NOTE — Telephone Encounter (Signed)
Pharmacy has been updated.

## 2016-08-17 ENCOUNTER — Ambulatory Visit: Payer: Federal, State, Local not specified - PPO | Admitting: Family

## 2016-09-11 ENCOUNTER — Other Ambulatory Visit: Payer: Self-pay | Admitting: Family Medicine

## 2016-09-14 ENCOUNTER — Encounter: Payer: Federal, State, Local not specified - PPO | Admitting: Family Medicine

## 2016-10-01 ENCOUNTER — Other Ambulatory Visit: Payer: Self-pay | Admitting: Family Medicine

## 2016-10-01 MED ORDER — LAMOTRIGINE 150 MG PO TABS: 150.0000 mg | ORAL_TABLET | Freq: Every day | ORAL | 0 refills | Status: DC

## 2016-10-01 NOTE — Telephone Encounter (Signed)
Requesting:   Alprazolam Contract    05/31/2016 UDS   Low risk next due on 12/01/2016 Last OV    05/31/2016 Last Refill   #30 with 2 refills on 03/16/2016  Please Advise

## 2016-10-01 NOTE — Telephone Encounter (Signed)
Advise on alprazolam refill 

## 2016-10-01 NOTE — Telephone Encounter (Signed)
Ok to refill Alprazolam with same sig, same number and 2 rf

## 2016-10-02 MED ORDER — ALPRAZOLAM 2 MG PO TABS: 2.0000 mg | ORAL_TABLET | Freq: Every evening | ORAL | 2 refills | Status: DC | PRN

## 2016-10-02 NOTE — Telephone Encounter (Signed)
Printed[facility]PCP [place] [place]

## 2016-10-02 NOTE — Addendum Note (Signed)
Addended by: Scharlene GlossEWING, Ralph Brouwer B on: 10/02/2016 07:13 AM   Modules accepted: Orders

## 2016-10-11 ENCOUNTER — Encounter: Payer: Federal, State, Local not specified - PPO | Admitting: Family Medicine

## 2016-10-12 ENCOUNTER — Ambulatory Visit (INDEPENDENT_AMBULATORY_CARE_PROVIDER_SITE_OTHER): Payer: Federal, State, Local not specified - PPO | Admitting: Family Medicine

## 2016-10-12 ENCOUNTER — Encounter: Payer: Self-pay | Admitting: Family Medicine

## 2016-10-12 VITALS — BP 102/66 | HR 83 | Temp 98.0°F | Resp 18 | Wt 178.4 lb

## 2016-10-12 DIAGNOSIS — R109 Unspecified abdominal pain: Secondary | ICD-10-CM | POA: Insufficient documentation

## 2016-10-12 DIAGNOSIS — Z0000 Encounter for general adult medical examination without abnormal findings: Secondary | ICD-10-CM | POA: Diagnosis not present

## 2016-10-12 DIAGNOSIS — G43809 Other migraine, not intractable, without status migrainosus: Secondary | ICD-10-CM

## 2016-10-12 DIAGNOSIS — F319 Bipolar disorder, unspecified: Secondary | ICD-10-CM

## 2016-10-12 DIAGNOSIS — E785 Hyperlipidemia, unspecified: Secondary | ICD-10-CM | POA: Diagnosis not present

## 2016-10-12 DIAGNOSIS — T7840XD Allergy, unspecified, subsequent encounter: Secondary | ICD-10-CM | POA: Diagnosis not present

## 2016-10-12 DIAGNOSIS — E559 Vitamin D deficiency, unspecified: Secondary | ICD-10-CM | POA: Diagnosis not present

## 2016-10-12 DIAGNOSIS — Z872 Personal history of diseases of the skin and subcutaneous tissue: Secondary | ICD-10-CM | POA: Diagnosis not present

## 2016-10-12 DIAGNOSIS — Z720 Tobacco use: Secondary | ICD-10-CM | POA: Diagnosis not present

## 2016-10-12 DIAGNOSIS — G43009 Migraine without aura, not intractable, without status migrainosus: Secondary | ICD-10-CM

## 2016-10-12 DIAGNOSIS — M5412 Radiculopathy, cervical region: Secondary | ICD-10-CM

## 2016-10-12 LAB — TSH: TSH: 0.94 mIU/L

## 2016-10-12 LAB — CBC
HCT: 43.3 % (ref 35.0–45.0)
HEMOGLOBIN: 14.8 g/dL (ref 11.7–15.5)
MCH: 31.3 pg (ref 27.0–33.0)
MCHC: 34.2 g/dL (ref 32.0–36.0)
MCV: 91.5 fL (ref 80.0–100.0)
MPV: 10.6 fL (ref 7.5–12.5)
PLATELETS: 257 10*3/uL (ref 140–400)
RBC: 4.73 MIL/uL (ref 3.80–5.10)
RDW: 13 % (ref 11.0–15.0)
WBC: 8.9 10*3/uL (ref 3.8–10.8)

## 2016-10-12 MED ORDER — VARENICLINE TARTRATE 0.5 MG X 11 & 1 MG X 42 PO MISC: ORAL | 0 refills | Status: DC

## 2016-10-12 MED ORDER — ALPRAZOLAM 2 MG PO TABS: 2.0000 mg | ORAL_TABLET | Freq: Every evening | ORAL | 5 refills | Status: DC | PRN

## 2016-10-12 MED ORDER — PROMETHAZINE HCL 25 MG PO TABS: 25.0000 mg | ORAL_TABLET | Freq: Three times a day (TID) | ORAL | 2 refills | Status: DC | PRN

## 2016-10-12 MED ORDER — VARENICLINE TARTRATE 1 MG PO TABS: 1.0000 mg | ORAL_TABLET | Freq: Two times a day (BID) | ORAL | 5 refills | Status: DC

## 2016-10-12 MED ORDER — LAMOTRIGINE 150 MG PO TABS: 150.0000 mg | ORAL_TABLET | Freq: Every day | ORAL | 0 refills | Status: DC

## 2016-10-12 MED ORDER — DULOXETINE HCL 60 MG PO CPEP: 60.0000 mg | ORAL_CAPSULE | Freq: Every day | ORAL | 11 refills | Status: DC

## 2016-10-12 MED ORDER — KETOROLAC TROMETHAMINE 30 MG/ML IJ SOLN
30.0000 mg | Freq: Once | INTRAMUSCULAR | Status: AC
  Administered 2016-10-12: 30 mg via INTRAMUSCULAR

## 2016-10-12 MED ORDER — RIZATRIPTAN BENZOATE 10 MG PO TABS: 10.0000 mg | ORAL_TABLET | ORAL | 0 refills | Status: DC | PRN

## 2016-10-12 NOTE — Patient Instructions (Signed)
Preventive Care 18-39 Years, Female Preventive care refers to lifestyle choices and visits with your health care provider that can promote health and wellness. What does preventive care include?  A yearly physical exam. This is also called an annual well check.  Dental exams once or twice a year.  Routine eye exams. Ask your health care provider how often you should have your eyes checked.  Personal lifestyle choices, including: ? Daily care of your teeth and gums. ? Regular physical activity. ? Eating a healthy diet. ? Avoiding tobacco and drug use. ? Limiting alcohol use. ? Practicing safe sex. ? Taking vitamin and mineral supplements as recommended by your health care provider. What happens during an annual well check? The services and screenings done by your health care provider during your annual well check will depend on your age, overall health, lifestyle risk factors, and family history of disease. Counseling Your health care provider may ask you questions about your:  Alcohol use.  Tobacco use.  Drug use.  Emotional well-being.  Home and relationship well-being.  Sexual activity.  Eating habits.  Work and work Statistician.  Method of birth control.  Menstrual cycle.  Pregnancy history.  Screening You may have the following tests or measurements:  Height, weight, and BMI.  Diabetes screening. This is done by checking your blood sugar (glucose) after you have not eaten for a while (fasting).  Blood pressure.  Lipid and cholesterol levels. These may be checked every 5 years starting at age 66.  Skin check.  Hepatitis C blood test.  Hepatitis B blood test.  Sexually transmitted disease (STD) testing.  BRCA-related cancer screening. This may be done if you have a family history of breast, ovarian, tubal, or peritoneal cancers.  Pelvic exam and Pap test. This may be done every 3 years starting at age 40. Starting at age 59, this may be done every 5  years if you have a Pap test in combination with an HPV test.  Discuss your test results, treatment options, and if necessary, the need for more tests with your health care provider. Vaccines Your health care provider may recommend certain vaccines, such as:  Influenza vaccine. This is recommended every year.  Tetanus, diphtheria, and acellular pertussis (Tdap, Td) vaccine. You may need a Td booster every 10 years.  Varicella vaccine. You may need this if you have not been vaccinated.  HPV vaccine. If you are 69 or younger, you may need three doses over 6 months.  Measles, mumps, and rubella (MMR) vaccine. You may need at least one dose of MMR. You may also need a second dose.  Pneumococcal 13-valent conjugate (PCV13) vaccine. You may need this if you have certain conditions and were not previously vaccinated.  Pneumococcal polysaccharide (PPSV23) vaccine. You may need one or two doses if you smoke cigarettes or if you have certain conditions.  Meningococcal vaccine. One dose is recommended if you are age 27-21 years and a first-year college student living in a residence hall, or if you have one of several medical conditions. You may also need additional booster doses.  Hepatitis A vaccine. You may need this if you have certain conditions or if you travel or work in places where you may be exposed to hepatitis A.  Hepatitis B vaccine. You may need this if you have certain conditions or if you travel or work in places where you may be exposed to hepatitis B.  Haemophilus influenzae type b (Hib) vaccine. You may need this if  you have certain risk factors.  Talk to your health care provider about which screenings and vaccines you need and how often you need them. This information is not intended to replace advice given to you by your health care provider. Make sure you discuss any questions you have with your health care provider. Document Released: 05/01/2001 Document Revised: 11/23/2015  Document Reviewed: 01/04/2015 Elsevier Interactive Patient Education  2017 [employer].

## 2016-10-12 NOTE — Assessment & Plan Note (Signed)
Encouraged complete cessation. Discussed need to quit as relates to risk of numerous cancers, cardiac and pulmonary disease as well as neurologic complications. Counseled for greater than 3 minutes. Agrees to try Chantix

## 2016-10-12 NOTE — Addendum Note (Signed)
Addended by: Harley AltoPRICE, KRISTY M on: 10/12/2016 03:46 PM   Modules accepted: Orders

## 2016-10-12 NOTE — Progress Notes (Signed)
Subjective:  I acted as a Neurosurgeonscribe for Dr. Abner GreenspanBlyth. [name], [place]  Patient ID: Jillian SauceDebra A Reyes, female    DOB: 09/06/1990, 26 y.o.   MRN: [id number]  No chief complaint on file.   HPI  Patient is in today for an annual exam.She is noting an increased frequency of headaches having at least one bad migraine monthly. The Imitrex is no longer working but she has used Maxalt with good results. No trauma or injury. She denies any recent febrile illness or hospitalization. She endorses being under a great deal of stress sleeping poorly not hydrating well etc. She continues to smoke but is ready to quit. Denies CP/palp/SOB/fevers/GI or GU c/o. Taking meds as prescribed  Patient Care Team: Bradd CanaryBlyth, Stacey A, MD as PCP - General (Family Medicine) Monica Bectonhekkekandam, Thomas J, MD as Consulting Physician (Sports Medicine) Antony Blackbirdobb, Shane, DC as Referring Physician (Chiropractic Medicine)   Past Medical History:  Diagnosis Date  . Abnormal cells of cervix   . Acne 01/06/2012  . Allergic state   . Back pain, lumbosacral   . Bilateral neck pain 03/16/2016  . Bipolar 1 disorder (HCC)   . Chicken pox as a child  . Contraceptive management 01/06/2012  . Contusion of leg 10/15/2012   right  . Diabetes mellitus    gestational   . Dyslipidemia 02/18/2013  . Eczema 01/06/2012  . Eczematous dermatitis of eyelid 09/09/2014  . Encounter for preconception consultation 07/27/2013  . Folliculitis 04/10/2016  . Left ankle sprain 04/10/2016  . Mid back pain   . Vitamin D deficiency 04/10/2015    Past Surgical History:  Procedure Laterality Date  . NO PAST SURGERIES      Family History  Problem Relation Age of Onset  . Heart disease Unknown   . Diabetes Father [id number]       type 2  . Thyroid disease Father   . Brain cancer Father   . Cancer Father        tumor of the brain cancer  . Migraines Mother   . Kidney disease Mother   . Colon polyps Mother   . Hyperlipidemia Unknown        great grandparents    Social  History   Social History  . Marital status: Single    Spouse name: N/A  . Number of children: 1  . Years of education: N/A   Occupational History  . clerck      [employer]   Social History Main Topics  . Smoking status: Current Every Day Smoker    Packs/day: 1.00    Types: Cigarettes  . Smokeless tobacco: Never Used     Comment: 1 cigarette twice a week  . Alcohol use No  . Drug use: No  . 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 Not on file   Social History Narrative   Has a son. Living with her mother, stepfather, and brother and sister.     2 caffeinated drinks daily.        Outpatient Medications Prior to Visit  Medication Sig Dispense Refill  . albuterol (PROVENTIL HFA;VENTOLIN HFA) 108 (90 Base) MCG/ACT inhaler Inhale 2 puffs into the lungs every 6 (six) hours as needed for wheezing or shortness of breath. 1 Inhaler 0  . alprazolam (XANAX) 2 MG tablet Take 1  tablet (2 mg total) by mouth at bedtime as needed for sleep. 30 tablet 2  . Cholecalciferol 2000 UNITS CAPS Take by mouth daily. Reported on 03/31/2015    . clobetasol cream (TEMOVATE) 0.05 % APPLY TOPICALLY DAILY AS NEEDED. ECZEMA 60 g 0  . DULoxetine (CYMBALTA) 60 MG capsule Take 1 capsule (60 mg total) by mouth daily. 30 capsule 11  . lamoTRIgine (LAMICTAL) 150 MG tablet Take 1 tablet (150 mg total) by mouth daily. 30 tablet 0  . SUMAtriptan (IMITREX) 50 MG tablet Take 1 tablet (50 mg total) by mouth once. May repeat in 2 hours if headache persists or recurs. 30 tablet 0  . cetirizine (ZYRTEC) 10 MG tablet Take 1 tablet (10 mg total) by mouth 2 (two) times daily as needed for allergies. 30 tablet 11  . predniSONE (DELTASONE) 10 MG tablet 4 tablets x 2 days, 3 tabs x 2 days, 2 tabs x 2 days, 1 tab x 2 days 20 tablet 0  . ranitidine (ZANTAC) 150 MG tablet Take 1 tablet  (150 mg total) by mouth 2 (two) times daily as needed for heartburn. 60 tablet 3  . tiZANidine (ZANAFLEX) 4 MG tablet Take 1 tablet (4 mg total) by mouth 2 (two) times daily. 30 tablet 1  . traMADol (ULTRAM) 50 MG tablet Take 1 tablet (50 mg total) by mouth every 8 (eight) hours as needed. 15 tablet 0   No facility-administered medications prior to visit.     No Known Allergies  Review of Systems  Constitutional: Negative for fever and malaise/fatigue.  HENT: Negative for congestion.   Eyes: Negative for blurred vision.  Respiratory: Negative for cough and shortness of breath.   Cardiovascular: Negative for chest pain, palpitations and leg swelling.  Gastrointestinal: Negative for vomiting.  Musculoskeletal: Negative for back pain.  Skin: Negative for rash.  Neurological: Negative for loss of consciousness and headaches.       Objective:    Physical Exam  Constitutional: She is oriented to person, place, and time. She appears well-developed and well-nourished. No distress.  HENT:  Head: Normocephalic and atraumatic.  Eyes: Conjunctivae are normal.  Neck: Normal range of motion. No thyromegaly present.  Cardiovascular: Normal rate and regular rhythm.   Pulmonary/Chest: Effort normal and breath sounds normal. She has no wheezes.  Abdominal: Soft. Bowel sounds are normal. There is no tenderness.  Musculoskeletal: Normal range of motion. She exhibits no edema or deformity.  Neurological: She is alert and oriented to person, place, and time.  Skin: Skin is warm and dry. She is not diaphoretic.  Psychiatric: She has a normal mood and affect.    There were no vitals taken for this visit. Wt Readings from Last 3 Encounters:  08/16/16 178 lb 4 oz (80.9 kg)  05/31/16 177 lb 6.4 oz (80.5 kg)  05/02/16 175 lb 12.8 oz (79.7 kg)   BP Readings from Last 3 Encounters:  08/16/16 124/68  05/31/16 98/68  05/02/16 (!) 90/50     Immunization History  Administered Date(s) Administered    . HPV Quadrivalent 01/04/2012, 02/04/2012  . Hepatitis B 07/05/2010  . Influenza Split 12/22/2010, 01/04/2012  . Influenza Whole 12/17/2012  . Influenza-Unspecified 01/04/2014  . Tdap 07/05/2010    Health Maintenance  Topic Date Due  . PAP SMEAR  09/24/2016  . INFLUENZA VACCINE  10/17/2016  . TETANUS/TDAP  07/04/2020  . HIV Screening  Completed    Lab Results  Component Value Date   WBC 7.6 03/31/2015   HGB  15.1 (H) 03/31/2015   HCT 45.0 03/31/2015   PLT 220.0 03/31/2015   GLUCOSE 87 03/31/2015   CHOL 152 03/31/2015   TRIG 120.0 03/31/2015   HDL 40.80 03/31/2015   LDLCALC 87 03/31/2015   ALT 25 03/31/2015   AST 22 03/31/2015   NA 137 03/31/2015   K 3.7 03/31/2015   CL 103 03/31/2015   CREATININE 0.84 03/31/2015   BUN 10 03/31/2015   CO2 26 03/31/2015   TSH 0.79 03/31/2015   MICROALBUR 0.8 03/31/2015    Lab Results  Component Value Date   TSH 0.79 03/31/2015   Lab Results  Component Value Date   WBC 7.6 03/31/2015   HGB 15.1 (H) 03/31/2015   HCT 45.0 03/31/2015   MCV 91.3 03/31/2015   PLT 220.0 03/31/2015   Lab Results  Component Value Date   NA 137 03/31/2015   K 3.7 03/31/2015   CO2 26 03/31/2015   GLUCOSE 87 03/31/2015   BUN 10 03/31/2015   CREATININE 0.84 03/31/2015   BILITOT 0.6 03/31/2015   ALKPHOS 60 03/31/2015   AST 22 03/31/2015   ALT 25 03/31/2015   PROT 6.8 03/31/2015   ALBUMIN 4.3 03/31/2015   CALCIUM 9.3 03/31/2015   ANIONGAP 2 (L) 05/12/2014   GFR 87.49 03/31/2015   Lab Results  Component Value Date   CHOL 152 03/31/2015   Lab Results  Component Value Date   HDL 40.80 03/31/2015   Lab Results  Component Value Date   LDLCALC 87 03/31/2015   Lab Results  Component Value Date   TRIG 120.0 03/31/2015   Lab Results  Component Value Date   CHOLHDL 4 03/31/2015   No results found for: HGBA1C       Assessment & Plan:   Problem List Items Addressed This Visit    None    Visit Diagnoses    Left cervical  radiculopathy          I have discontinued Ms. Reyes's SUMAtriptan, ranitidine, cetirizine, traMADol, tiZANidine, and predniSONE. I am also having her maintain her Cholecalciferol, albuterol, DULoxetine, clobetasol cream, lamoTRIgine, and alprazolam.  No orders of the defined types were placed in this encounter.   CMA served as Neurosurgeon during this visit. History, Physical and Plan performed by medical provider. Documentation and orders reviewed and attested to.  Crissie Sickles, [place]

## 2016-10-12 NOTE — Assessment & Plan Note (Signed)
Check level, not taking supplements

## 2016-10-12 NOTE — Assessment & Plan Note (Signed)
Patient encouraged to maintain heart healthy diet, regular exercise, adequate sleep. Consider daily probiotics. Take medications as prescribed 

## 2016-10-13 LAB — COMPREHENSIVE METABOLIC PANEL
ALBUMIN: 4.4 g/dL (ref 3.6–5.1)
ALK PHOS: 62 U/L (ref 33–115)
ALT: 19 U/L (ref 6–29)
AST: 17 U/L (ref 10–30)
BILIRUBIN TOTAL: 0.4 mg/dL (ref 0.2–1.2)
BUN: 11 mg/dL (ref 7–25)
CALCIUM: 9.3 mg/dL (ref 8.6–10.2)
CO2: 17 mmol/L — ABNORMAL LOW (ref 20–31)
Chloride: 106 mmol/L (ref 98–110)
Creat: 0.81 mg/dL (ref 0.50–1.10)
Glucose, Bld: 74 mg/dL (ref 65–99)
POTASSIUM: 4.2 mmol/L (ref 3.5–5.3)
Sodium: 137 mmol/L (ref 135–146)
TOTAL PROTEIN: 6.7 g/dL (ref 6.1–8.1)

## 2016-10-13 LAB — LIPID PANEL
CHOL/HDL RATIO: 4.2 ratio (ref <5.0)
CHOLESTEROL: 150 mg/dL (ref <200)
HDL: 36 mg/dL — AB (ref >50)
LDL Cholesterol: 70 mg/dL (ref <100)
TRIGLYCERIDES: 220 mg/dL — AB (ref <150)
VLDL: 44 mg/dL — AB (ref <30)

## 2016-10-13 LAB — VITAMIN D 25 HYDROXY (VIT D DEFICIENCY, FRACTURES): Vit D, 25-Hydroxy: 26 ng/mL — ABNORMAL LOW (ref 30–100)

## 2016-10-14 DIAGNOSIS — Z872 Personal history of diseases of the skin and subcutaneous tissue: Secondary | ICD-10-CM | POA: Insufficient documentation

## 2016-10-14 NOTE — Assessment & Plan Note (Signed)
Referred back to allergist since her symptoms are worsening.

## 2016-10-14 NOTE — Assessment & Plan Note (Signed)
If persistent will need referral to surgeon for excision of lesions.

## 2016-10-14 NOTE — Assessment & Plan Note (Addendum)
With headache today, given shot of toradol and a prescription for Phenergan to use when she gets home. Consider referral if worsens. Given rx for Maxalt

## 2016-10-14 NOTE — Assessment & Plan Note (Signed)
Encouraged heart healthy diet, increase exercise, avoid trans fats, consider a krill oil cap daily 

## 2016-10-14 NOTE — Assessment & Plan Note (Signed)
stableon current med, no changes today

## 2016-10-14 NOTE — Assessment & Plan Note (Signed)
With some intermittent diarrhea as well. Proceed with abdominal ultrasound. Avoid offending foods.

## 2016-10-15 ENCOUNTER — Other Ambulatory Visit: Payer: Self-pay | Admitting: Family Medicine

## 2016-10-15 ENCOUNTER — Telehealth: Payer: Self-pay | Admitting: Family Medicine

## 2016-10-15 ENCOUNTER — Encounter: Payer: Self-pay | Admitting: Family Medicine

## 2016-10-15 DIAGNOSIS — L0291 Cutaneous abscess, unspecified: Secondary | ICD-10-CM

## 2016-10-15 MED ORDER — SULFAMETHOXAZOLE-TRIMETHOPRIM 800-160 MG PO TABS: 1.0000 | ORAL_TABLET | Freq: Two times a day (BID) | ORAL | 0 refills | Status: DC

## 2016-10-15 MED ORDER — VITAMIN D (ERGOCALCIFEROL) 1.25 MG (50000 UNIT) PO CAPS: 50000.0000 [IU] | ORAL_CAPSULE | ORAL | 4 refills | Status: DC

## 2016-10-15 NOTE — Telephone Encounter (Signed)
Again please referral to general surgeon for problem discussed at OV---is getting worse.

## 2016-10-15 NOTE — Telephone Encounter (Signed)
Pt called in she said that in visit she and PCP discussed a referral to a general surgeon for abcess that she keeps getting. [place]   CB: [phone]

## 2016-10-15 NOTE — Telephone Encounter (Signed)
Please advise   pc 

## 2016-10-16 DIAGNOSIS — L732 Hidradenitis suppurativa: Secondary | ICD-10-CM | POA: Diagnosis not present

## 2016-10-22 ENCOUNTER — Ambulatory Visit (HOSPITAL_BASED_OUTPATIENT_CLINIC_OR_DEPARTMENT_OTHER): Admission: RE | Admit: 2016-10-22 | Payer: Federal, State, Local not specified - PPO | Source: Ambulatory Visit

## 2016-10-24 ENCOUNTER — Ambulatory Visit (HOSPITAL_BASED_OUTPATIENT_CLINIC_OR_DEPARTMENT_OTHER)
Admission: RE | Admit: 2016-10-24 | Discharge: 2016-10-24 | Disposition: A | Discharge status: Home / Self Care | Payer: Federal, State, Local not specified - PPO | Source: Ambulatory Visit | Attending: Family Medicine | Admitting: Family Medicine

## 2016-10-24 DIAGNOSIS — R1011 Right upper quadrant pain: Secondary | ICD-10-CM | POA: Diagnosis not present

## 2016-10-24 DIAGNOSIS — R109 Unspecified abdominal pain: Secondary | ICD-10-CM | POA: Diagnosis not present

## 2016-10-25 ENCOUNTER — Telehealth: Payer: Self-pay | Admitting: Family Medicine

## 2016-10-25 ENCOUNTER — Encounter: Payer: Self-pay | Admitting: Family Medicine

## 2016-10-25 ENCOUNTER — Other Ambulatory Visit: Payer: Self-pay | Admitting: Family Medicine

## 2016-10-25 DIAGNOSIS — R109 Unspecified abdominal pain: Secondary | ICD-10-CM

## 2016-10-25 DIAGNOSIS — R197 Diarrhea, unspecified: Secondary | ICD-10-CM

## 2016-10-25 NOTE — Telephone Encounter (Signed)
Pt is calling in to rtn call to cma

## 2016-11-02 ENCOUNTER — Other Ambulatory Visit: Payer: Self-pay | Admitting: Family Medicine

## 2016-11-13 ENCOUNTER — Other Ambulatory Visit: Payer: Self-pay

## 2016-11-13 NOTE — Progress Notes (Signed)
June Rode/C'ed duplicate Lamictal/thx dmf

## 2016-11-24 DIAGNOSIS — Z87891 Personal history of nicotine dependence: Secondary | ICD-10-CM | POA: Diagnosis not present

## 2016-11-24 DIAGNOSIS — S3993XA Unspecified injury of pelvis, initial encounter: Secondary | ICD-10-CM | POA: Diagnosis not present

## 2016-11-24 DIAGNOSIS — S81002A Unspecified open wound, left knee, initial encounter: Secondary | ICD-10-CM | POA: Diagnosis not present

## 2016-11-24 DIAGNOSIS — S59901A Unspecified injury of right elbow, initial encounter: Secondary | ICD-10-CM | POA: Diagnosis not present

## 2016-11-24 DIAGNOSIS — S61401A Unspecified open wound of right hand, initial encounter: Secondary | ICD-10-CM | POA: Diagnosis not present

## 2016-11-24 DIAGNOSIS — S40211A Abrasion of right shoulder, initial encounter: Secondary | ICD-10-CM | POA: Diagnosis not present

## 2016-11-24 DIAGNOSIS — S80212A Abrasion, left knee, initial encounter: Secondary | ICD-10-CM | POA: Diagnosis not present

## 2016-11-24 DIAGNOSIS — M899 Disorder of bone, unspecified: Secondary | ICD-10-CM | POA: Diagnosis not present

## 2016-11-24 DIAGNOSIS — T07XXXA Unspecified multiple injuries, initial encounter: Secondary | ICD-10-CM | POA: Diagnosis not present

## 2016-11-24 DIAGNOSIS — S50812A Abrasion of left forearm, initial encounter: Secondary | ICD-10-CM | POA: Diagnosis not present

## 2016-11-24 DIAGNOSIS — S81001A Unspecified open wound, right knee, initial encounter: Secondary | ICD-10-CM | POA: Diagnosis not present

## 2016-11-24 DIAGNOSIS — Z041 Encounter for examination and observation following transport accident: Secondary | ICD-10-CM | POA: Diagnosis not present

## 2016-11-24 DIAGNOSIS — S60551A Superficial foreign body of right hand, initial encounter: Secondary | ICD-10-CM | POA: Diagnosis not present

## 2016-11-24 DIAGNOSIS — S50811A Abrasion of right forearm, initial encounter: Secondary | ICD-10-CM | POA: Diagnosis not present

## 2016-11-24 DIAGNOSIS — G8911 Acute pain due to trauma: Secondary | ICD-10-CM | POA: Diagnosis not present

## 2016-11-24 DIAGNOSIS — S199XXA Unspecified injury of neck, initial encounter: Secondary | ICD-10-CM | POA: Diagnosis not present

## 2016-11-24 DIAGNOSIS — S3992XA Unspecified injury of lower back, initial encounter: Secondary | ICD-10-CM | POA: Diagnosis not present

## 2016-11-24 DIAGNOSIS — S80211A Abrasion, right knee, initial encounter: Secondary | ICD-10-CM | POA: Diagnosis not present

## 2016-11-24 DIAGNOSIS — S60811A Abrasion of right wrist, initial encounter: Secondary | ICD-10-CM | POA: Diagnosis not present

## 2016-11-24 DIAGNOSIS — S0990XA Unspecified injury of head, initial encounter: Secondary | ICD-10-CM | POA: Diagnosis not present

## 2016-11-24 DIAGNOSIS — F419 Anxiety disorder, unspecified: Secondary | ICD-10-CM | POA: Diagnosis not present

## 2016-11-24 DIAGNOSIS — F319 Bipolar disorder, unspecified: Secondary | ICD-10-CM | POA: Diagnosis not present

## 2016-11-24 DIAGNOSIS — S3991XA Unspecified injury of abdomen, initial encounter: Secondary | ICD-10-CM | POA: Diagnosis not present

## 2016-11-24 DIAGNOSIS — S80812A Abrasion, left lower leg, initial encounter: Secondary | ICD-10-CM | POA: Diagnosis not present

## 2016-11-24 DIAGNOSIS — S6991XA Unspecified injury of right wrist, hand and finger(s), initial encounter: Secondary | ICD-10-CM | POA: Diagnosis not present

## 2016-11-24 DIAGNOSIS — S80811A Abrasion, right lower leg, initial encounter: Secondary | ICD-10-CM | POA: Diagnosis not present

## 2016-11-24 DIAGNOSIS — S40811A Abrasion of right upper arm, initial encounter: Secondary | ICD-10-CM | POA: Diagnosis not present

## 2016-11-24 DIAGNOSIS — S40812A Abrasion of left upper arm, initial encounter: Secondary | ICD-10-CM | POA: Diagnosis not present

## 2016-11-24 DIAGNOSIS — S40212A Abrasion of left shoulder, initial encounter: Secondary | ICD-10-CM | POA: Diagnosis not present

## 2016-11-24 DIAGNOSIS — S299XXA Unspecified injury of thorax, initial encounter: Secondary | ICD-10-CM | POA: Diagnosis not present

## 2016-11-25 DIAGNOSIS — S60811A Abrasion of right wrist, initial encounter: Secondary | ICD-10-CM | POA: Diagnosis not present

## 2016-11-25 DIAGNOSIS — S80811A Abrasion, right lower leg, initial encounter: Secondary | ICD-10-CM | POA: Diagnosis not present

## 2016-11-25 DIAGNOSIS — S80812A Abrasion, left lower leg, initial encounter: Secondary | ICD-10-CM | POA: Diagnosis not present

## 2016-11-28 DIAGNOSIS — S80811A Abrasion, right lower leg, initial encounter: Secondary | ICD-10-CM | POA: Diagnosis not present

## 2016-11-30 ENCOUNTER — Ambulatory Visit (INDEPENDENT_AMBULATORY_CARE_PROVIDER_SITE_OTHER): Payer: Federal, State, Local not specified - PPO | Admitting: Family Medicine

## 2016-11-30 ENCOUNTER — Encounter: Payer: Self-pay | Admitting: Family Medicine

## 2016-11-30 ENCOUNTER — Telehealth: Payer: Self-pay | Admitting: Family Medicine

## 2016-11-30 DIAGNOSIS — S40811A Abrasion of right upper arm, initial encounter: Secondary | ICD-10-CM | POA: Diagnosis not present

## 2016-11-30 DIAGNOSIS — S80212A Abrasion, left knee, initial encounter: Secondary | ICD-10-CM

## 2016-11-30 DIAGNOSIS — S80811A Abrasion, right lower leg, initial encounter: Secondary | ICD-10-CM

## 2016-11-30 DIAGNOSIS — K649 Unspecified hemorrhoids: Secondary | ICD-10-CM | POA: Diagnosis not present

## 2016-11-30 DIAGNOSIS — S40812A Abrasion of left upper arm, initial encounter: Secondary | ICD-10-CM | POA: Diagnosis not present

## 2016-11-30 DIAGNOSIS — S80812A Abrasion, left lower leg, initial encounter: Secondary | ICD-10-CM

## 2016-11-30 DIAGNOSIS — S80211A Abrasion, right knee, initial encounter: Secondary | ICD-10-CM | POA: Diagnosis not present

## 2016-11-30 MED ORDER — SILVER SULFADIAZINE 1 % EX CREA: 1.0000 "application " | TOPICAL_CREAM | Freq: Every day | CUTANEOUS | 0 refills | Status: DC

## 2016-11-30 MED ORDER — HYDROCORTISONE 2.5 % RE CREA: TOPICAL_CREAM | Freq: Three times a day (TID) | RECTAL | Status: DC

## 2016-11-30 MED ORDER — HYDROCORTISONE 2.5 % RE CREA: 1.0000 "application " | TOPICAL_CREAM | Freq: Three times a day (TID) | RECTAL | 0 refills | Status: DC

## 2016-11-30 MED ORDER — "XEROFORM PETROLAT GAUZE 5""X9"" EX MISC": CUTANEOUS | 1 refills | Status: DC

## 2016-11-30 NOTE — Telephone Encounter (Signed)
Rx was sent to pharmacy. 

## 2016-11-30 NOTE — Patient Instructions (Signed)
How to Change Your Dressing A dressing is a material that is placed in and over wounds. A dressing helps your wound to heal by protecting it from:  Bacteria.  Worse injury.  Being too dry or too wet.  What are the risks? The sticky (adhesive) tape that is used with a dressing may make your skin sore or irritated, or it may cause a rash. These are the most common problems. However, more serious problems can develop, such as:  Bleeding.  Infection.  How to change your dressing Getting Ready to Change Your Dressing   Take a shower before you do the first dressing change of the day. If your doctor does not want your wound to get wet and your dressing is not waterproof, you may need to put plastic leak-proof sealing wrap on your dressing to protect it.  If needed, take pain medicine as told by your doctor 30 minutes before you change your dressing.  Set up a clean station for wound care. You will need: ? A plastic trash bag that is open and ready to use. ? Hand sanitizer. ? Wound cleanser or salt-water solution (saline) as told by your doctor. ? New dressing material or bandages. Make sure to open the dressing package so the dressing stays on the inside of the package. You may also need these supplies in your clean station:  A box of vinyl gloves.  Tape.  Skin protectant. This may be a wipe, film, or spray.  Clean or germ-free (sterile) scissors.  A cotton-tipped applicator.  Taking Off Your Old Dressing  Wash your hands with soap and water. Dry your hands with a clean towel. If you cannot use soap and water, use hand sanitizer.  If you are using gloves, put on the gloves before you take off the dressing.  Gently take off any adhesive or tape by pulling it off in the direction of your hair growth. Only touch the outside edges of the dressing.  Take off the dressing. If the dressing sticks to your skin, wet the dressing with a germ-free salt-water solution. This helps it  come off more easily.  Take off any gauze or packing in your wound.  Throw the old dressing supplies into the ready trash bag.  Take off your gloves. To take off each glove, grab the cuff with your other hand and turn the glove inside out. Put the gloves in the trash right away.  Wash your hands with soap and water. Dry your hands with a clean towel. If you cannot use soap and water, use hand sanitizer. Cleaning Your Wound  Follow instructions from your doctor about how to clean your wound. This may include using a salt-water solution or recommended wound cleanser.  Do not use over-the-counter medicated or antiseptic creams, sprays, liquids, or dressings unless your doctor tells you to do that.  Use a clean gauze pad to clean the area fully with the salt-water solution or wound cleanser that your doctor recommends.  Throw the gauze pad into the trash bag.  Wash your hands with soap and water. Dry your hands with a clean towel. If you cannot use soap and water, use hand sanitizer. Putting on the Dressing  If your doctor recommended a skin protectant, put it on the skin around the wound.  Cover the wound with the recommended dressing, such as a nonstick gauze or bandage. Make sure to touch only the outside edges of the dressing. Do not touch the inside of the dressing.    Attach the dressing so all sides stay in place. You may do this with the attached medical adhesive, roll gauze, or tape. If you use tape, do not wrap the tape all the way around your arm or leg.  Take off your gloves. Put them in the trash bag with the old dressing. Tie the bag shut and throw it away.  Wash your hands with soap and water. Dry your hands with a clean towel. If you cannot use soap and water, use hand sanitizer. Get help if:   You have new pain.  You have irritation, a rash, or itching around the wound or dressing.  Changing your dressing is painful.  Changing your dressing causes a lot of  bleeding. Get help right away if:  You have very bad pain.  You have signs of infection, such as: ? More redness, swelling, or pain. ? More fluid or blood. ? Warmth. ? Pus or a bad smell. ? Red streaks leading from wound. ? A fever. This information is not intended to replace advice given to you by your health care provider. Make sure you discuss any questions you have with your health care provider. Document Released: 06/01/2008 Document Revised: 08/11/2015 Document Reviewed: 12/09/2014 Elsevier Interactive Patient Education  2018 [employer]  
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
## 2016-11-30 NOTE — Telephone Encounter (Signed)
Pt mother states they use [facility] [address]. Please call in meds there today.

## 2016-12-01 DIAGNOSIS — K649 Unspecified hemorrhoids: Secondary | ICD-10-CM | POA: Insufficient documentation

## 2016-12-01 NOTE — Assessment & Plan Note (Signed)
rx anusol rto prn

## 2016-12-03 ENCOUNTER — Inpatient Hospital Stay: Payer: Federal, State, Local not specified - PPO | Admitting: Family

## 2016-12-04 ENCOUNTER — Encounter: Payer: Self-pay | Admitting: Family Medicine

## 2016-12-04 ENCOUNTER — Telehealth: Payer: Self-pay | Admitting: Family Medicine

## 2016-12-04 ENCOUNTER — Ambulatory Visit (INDEPENDENT_AMBULATORY_CARE_PROVIDER_SITE_OTHER): Payer: Federal, State, Local not specified - PPO | Admitting: Family Medicine

## 2016-12-04 VITALS — BP 110/76 | HR 83 | Temp 98.4°F | Ht 65.0 in

## 2016-12-04 DIAGNOSIS — M79661 Pain in right lower leg: Secondary | ICD-10-CM | POA: Diagnosis not present

## 2016-12-04 DIAGNOSIS — Z4800 Encounter for change or removal of nonsurgical wound dressing: Secondary | ICD-10-CM | POA: Diagnosis not present

## 2016-12-04 DIAGNOSIS — L03115 Cellulitis of right lower limb: Secondary | ICD-10-CM

## 2016-12-04 DIAGNOSIS — S81001D Unspecified open wound, right knee, subsequent encounter: Secondary | ICD-10-CM | POA: Diagnosis not present

## 2016-12-04 DIAGNOSIS — S81002D Unspecified open wound, left knee, subsequent encounter: Secondary | ICD-10-CM | POA: Diagnosis not present

## 2016-12-04 DIAGNOSIS — S61501D Unspecified open wound of right wrist, subsequent encounter: Secondary | ICD-10-CM | POA: Diagnosis not present

## 2016-12-04 DIAGNOSIS — S81801D Unspecified open wound, right lower leg, subsequent encounter: Secondary | ICD-10-CM | POA: Diagnosis not present

## 2016-12-04 MED ORDER — HYDROCODONE-ACETAMINOPHEN 5-325 MG PO TABS: 1.0000 | ORAL_TABLET | Freq: Four times a day (QID) | ORAL | 0 refills | Status: DC | PRN

## 2016-12-04 MED ORDER — CEFTRIAXONE SODIUM 500 MG IJ SOLR
500.0000 mg | Freq: Once | INTRAMUSCULAR | Status: AC
  Administered 2016-12-04: 500 mg via INTRAMUSCULAR

## 2016-12-04 MED ORDER — CEPHALEXIN 500 MG PO CAPS: 500.0000 mg | ORAL_CAPSULE | Freq: Four times a day (QID) | ORAL | 0 refills | Status: DC

## 2016-12-04 NOTE — Telephone Encounter (Signed)
Please advise    PC 

## 2016-12-04 NOTE — Telephone Encounter (Signed)
Patient was seen by Dr Laury Axon since this phone call was placed. Does she still have a question? I am not actually sure who does skin grafts. Might be plastic surgeons. Would have to call wound center and see if they advise if patient needs more direction

## 2016-12-04 NOTE — Telephone Encounter (Signed)
Relation to pt: self  Call back number: [phone]  Reason for call:  Patient was referred to [facility] and as per patient they don't do "knee/skin graft" patient requesting new referral and would like to discuss, please advise

## 2016-12-04 NOTE — Patient Instructions (Signed)
How to Change Your Dressing A dressing is a material that is placed in and over wounds. A dressing helps your wound to heal by protecting it from:  Bacteria.  Worse injury.  Being too dry or too wet.  What are the risks? The sticky (adhesive) tape that is used with a dressing may make your skin sore or irritated, or it may cause a rash. These are the most common problems. However, more serious problems can develop, such as:  Bleeding.  Infection.  How to change your dressing Getting Ready to Change Your Dressing   Take a shower before you do the first dressing change of the day. If your doctor does not want your wound to get wet and your dressing is not waterproof, you may need to put plastic leak-proof sealing wrap on your dressing to protect it.  If needed, take pain medicine as told by your doctor 30 minutes before you change your dressing.  Set up a clean station for wound care. You will need: ? A plastic trash bag that is open and ready to use. ? Hand sanitizer. ? Wound cleanser or salt-water solution (saline) as told by your doctor. ? New dressing material or bandages. Make sure to open the dressing package so the dressing stays on the inside of the package. You may also need these supplies in your clean station:  A box of vinyl gloves.  Tape.  Skin protectant. This may be a wipe, film, or spray.  Clean or germ-free (sterile) scissors.  A cotton-tipped applicator.  Taking Off Your Old Dressing  Wash your hands with soap and water. Dry your hands with a clean towel. If you cannot use soap and water, use hand sanitizer.  If you are using gloves, put on the gloves before you take off the dressing.  Gently take off any adhesive or tape by pulling it off in the direction of your hair growth. Only touch the outside edges of the dressing.  Take off the dressing. If the dressing sticks to your skin, wet the dressing with a germ-free salt-water solution. This helps it  come off more easily.  Take off any gauze or packing in your wound.  Throw the old dressing supplies into the ready trash bag.  Take off your gloves. To take off each glove, grab the cuff with your other hand and turn the glove inside out. Put the gloves in the trash right away.  Wash your hands with soap and water. Dry your hands with a clean towel. If you cannot use soap and water, use hand sanitizer. Cleaning Your Wound  Follow instructions from your doctor about how to clean your wound. This may include using a salt-water solution or recommended wound cleanser.  Do not use over-the-counter medicated or antiseptic creams, sprays, liquids, or dressings unless your doctor tells you to do that.  Use a clean gauze pad to clean the area fully with the salt-water solution or wound cleanser that your doctor recommends.  Throw the gauze pad into the trash bag.  Wash your hands with soap and water. Dry your hands with a clean towel. If you cannot use soap and water, use hand sanitizer. Putting on the Dressing  If your doctor recommended a skin protectant, put it on the skin around the wound.  Cover the wound with the recommended dressing, such as a nonstick gauze or bandage. Make sure to touch only the outside edges of the dressing. Do not touch the inside of the dressing.    Attach the dressing so all sides stay in place. You may do this with the attached medical adhesive, roll gauze, or tape. If you use tape, do not wrap the tape all the way around your arm or leg.  Take off your gloves. Put them in the trash bag with the old dressing. Tie the bag shut and throw it away.  Wash your hands with soap and water. Dry your hands with a clean towel. If you cannot use soap and water, use hand sanitizer. Get help if:   You have new pain.  You have irritation, a rash, or itching around the wound or dressing.  Changing your dressing is painful.  Changing your dressing causes a lot of  bleeding. Get help right away if:  You have very bad pain.  You have signs of infection, such as: ? More redness, swelling, or pain. ? More fluid or blood. ? Warmth. ? Pus or a bad smell. ? Red streaks leading from wound. ? A fever. This information is not intended to replace advice given to you by your health care provider. Make sure you discuss any questions you have with your health care provider. Document Released: 06/01/2008 Document Revised: 08/11/2015 Document Reviewed: 12/09/2014 Elsevier Interactive Patient Education  2018 [employer]  
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
## 2016-12-05 ENCOUNTER — Ambulatory Visit (HOSPITAL_BASED_OUTPATIENT_CLINIC_OR_DEPARTMENT_OTHER)
Admission: RE | Admit: 2016-12-05 | Discharge: 2016-12-05 | Disposition: A | Discharge status: Home / Self Care | Payer: Federal, State, Local not specified - PPO | Source: Ambulatory Visit | Attending: Family Medicine | Admitting: Family Medicine

## 2016-12-05 DIAGNOSIS — R600 Localized edema: Secondary | ICD-10-CM | POA: Diagnosis not present

## 2016-12-05 DIAGNOSIS — M7989 Other specified soft tissue disorders: Secondary | ICD-10-CM | POA: Diagnosis not present

## 2016-12-05 DIAGNOSIS — M79661 Pain in right lower leg: Secondary | ICD-10-CM | POA: Insufficient documentation

## 2016-12-05 NOTE — Telephone Encounter (Signed)
We did not talk about skin graft-- she has appointment with trauma surgeon at [facility] -- did they discuss it Other wise I would refer to wound clinic probably
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
## 2016-12-05 NOTE — Telephone Encounter (Signed)
YL-Is this question about "knee/skin graft" something that was addressed at the last visit? Plz advise/thx dmf

## 2016-12-07 DIAGNOSIS — S81012D Laceration without foreign body, left knee, subsequent encounter: Secondary | ICD-10-CM | POA: Diagnosis not present

## 2016-12-07 DIAGNOSIS — S81012A Laceration without foreign body, left knee, initial encounter: Secondary | ICD-10-CM | POA: Diagnosis not present

## 2016-12-07 DIAGNOSIS — S62664A Nondisplaced fracture of distal phalanx of right ring finger, initial encounter for closed fracture: Secondary | ICD-10-CM | POA: Diagnosis not present

## 2016-12-07 DIAGNOSIS — S62664D Nondisplaced fracture of distal phalanx of right ring finger, subsequent encounter for fracture with routine healing: Secondary | ICD-10-CM | POA: Diagnosis not present

## 2016-12-07 DIAGNOSIS — M79644 Pain in right finger(s): Secondary | ICD-10-CM | POA: Diagnosis not present

## 2016-12-07 DIAGNOSIS — S81001D Unspecified open wound, right knee, subsequent encounter: Secondary | ICD-10-CM | POA: Diagnosis not present

## 2016-12-07 DIAGNOSIS — S81011A Laceration without foreign body, right knee, initial encounter: Secondary | ICD-10-CM | POA: Diagnosis not present

## 2016-12-07 DIAGNOSIS — S81011D Laceration without foreign body, right knee, subsequent encounter: Secondary | ICD-10-CM | POA: Diagnosis not present

## 2016-12-07 DIAGNOSIS — Y33XXXA Other specified events, undetermined intent, initial encounter: Secondary | ICD-10-CM | POA: Diagnosis not present

## 2016-12-07 DIAGNOSIS — S62634A Displaced fracture of distal phalanx of right ring finger, initial encounter for closed fracture: Secondary | ICD-10-CM | POA: Diagnosis not present

## 2016-12-07 DIAGNOSIS — Z87891 Personal history of nicotine dependence: Secondary | ICD-10-CM | POA: Diagnosis not present

## 2016-12-11 ENCOUNTER — Ambulatory Visit: Payer: Federal, State, Local not specified - PPO | Admitting: Family Medicine

## 2016-12-12 ENCOUNTER — Telehealth: Payer: Self-pay | Admitting: Family Medicine

## 2016-12-12 NOTE — Telephone Encounter (Signed)
Caller name: Lyla Son  Relation to pt: RN from [facility]  Call back number: [phone]    Reason for call:  RN wanted to inform PCP patient is not home therefore omitting 1 visit and will return Friday 12/14/16.

## 2016-12-13 NOTE — Telephone Encounter (Signed)
Noted  pc 

## 2016-12-14 DIAGNOSIS — Z4800 Encounter for change or removal of nonsurgical wound dressing: Secondary | ICD-10-CM | POA: Diagnosis not present

## 2016-12-14 DIAGNOSIS — S81001D Unspecified open wound, right knee, subsequent encounter: Secondary | ICD-10-CM | POA: Diagnosis not present

## 2016-12-14 DIAGNOSIS — S81002D Unspecified open wound, left knee, subsequent encounter: Secondary | ICD-10-CM | POA: Diagnosis not present

## 2016-12-14 DIAGNOSIS — S61501D Unspecified open wound of right wrist, subsequent encounter: Secondary | ICD-10-CM | POA: Diagnosis not present

## 2016-12-18 ENCOUNTER — Telehealth: Payer: Self-pay | Admitting: Family Medicine

## 2016-12-18 ENCOUNTER — Telehealth: Payer: Self-pay

## 2016-12-18 NOTE — Telephone Encounter (Signed)
Caller name:angela williams Relation to UJ:[id number] home care Call back [phone] Pharmacy:  Reason for call: pt has agreed to omit today's visit, will be seen and discharged on Friday, family is independent with wound care.

## 2016-12-18 NOTE — Telephone Encounter (Signed)
Noted  

## 2016-12-18 NOTE — Telephone Encounter (Signed)
Plan of care received from [facility]. Form placed in MD red folder.

## 2016-12-21 ENCOUNTER — Encounter: Payer: Self-pay | Admitting: Family Medicine

## 2016-12-21 ENCOUNTER — Ambulatory Visit (HOSPITAL_BASED_OUTPATIENT_CLINIC_OR_DEPARTMENT_OTHER)
Admission: RE | Admit: 2016-12-21 | Discharge: 2016-12-21 | Disposition: A | Discharge status: Home / Self Care | Payer: Federal, State, Local not specified - PPO | Source: Ambulatory Visit | Attending: Family Medicine | Admitting: Family Medicine

## 2016-12-21 ENCOUNTER — Ambulatory Visit (INDEPENDENT_AMBULATORY_CARE_PROVIDER_SITE_OTHER): Payer: Federal, State, Local not specified - PPO | Admitting: Family Medicine

## 2016-12-21 DIAGNOSIS — Z23 Encounter for immunization: Secondary | ICD-10-CM

## 2016-12-21 DIAGNOSIS — M25561 Pain in right knee: Secondary | ICD-10-CM | POA: Diagnosis not present

## 2016-12-21 DIAGNOSIS — S8991XA Unspecified injury of right lower leg, initial encounter: Secondary | ICD-10-CM | POA: Diagnosis not present

## 2016-12-21 DIAGNOSIS — M79661 Pain in right lower leg: Secondary | ICD-10-CM | POA: Diagnosis not present

## 2016-12-21 DIAGNOSIS — M79641 Pain in right hand: Secondary | ICD-10-CM

## 2016-12-21 DIAGNOSIS — E559 Vitamin D deficiency, unspecified: Secondary | ICD-10-CM

## 2016-12-21 DIAGNOSIS — M79662 Pain in left lower leg: Secondary | ICD-10-CM

## 2016-12-21 DIAGNOSIS — Z720 Tobacco use: Secondary | ICD-10-CM

## 2016-12-21 DIAGNOSIS — M7989 Other specified soft tissue disorders: Secondary | ICD-10-CM | POA: Diagnosis not present

## 2016-12-21 LAB — COMPREHENSIVE METABOLIC PANEL
ALBUMIN: 4.2 g/dL (ref 3.5–5.2)
ALK PHOS: 55 U/L (ref 39–117)
ALT: 24 U/L (ref 0–35)
AST: 20 U/L (ref 0–37)
BUN: 9 mg/dL (ref 6–23)
CALCIUM: 9.4 mg/dL (ref 8.4–10.5)
CO2: 27 mEq/L (ref 19–32)
Chloride: 103 mEq/L (ref 96–112)
Creatinine, Ser: 0.78 mg/dL (ref 0.40–1.20)
GFR: 94.04 mL/min (ref >60.00)
Glucose, Bld: 106 mg/dL — ABNORMAL HIGH (ref 70–99)
POTASSIUM: 4.1 meq/L (ref 3.5–5.1)
Sodium: 137 mEq/L (ref 135–145)
TOTAL PROTEIN: 7.1 g/dL (ref 6.0–8.3)
Total Bilirubin: 0.4 mg/dL (ref 0.2–1.2)

## 2016-12-21 LAB — CBC
HCT: 43.5 % (ref 36.0–46.0)
Hemoglobin: 14.4 g/dL (ref 12.0–15.0)
MCHC: 33.2 g/dL (ref 30.0–36.0)
MCV: 93.7 fl (ref 78.0–100.0)
PLATELETS: 246 10*3/uL (ref 150.0–400.0)
RBC: 4.64 Mil/uL (ref 3.87–5.11)
RDW: 13.2 % (ref 11.5–15.5)
WBC: 6.7 10*3/uL (ref 4.0–10.5)

## 2016-12-21 LAB — VITAMIN D 25 HYDROXY (VIT D DEFICIENCY, FRACTURES): VITD: 20.97 ng/mL — AB (ref 30.00–100.00)

## 2016-12-21 MED ORDER — LAMOTRIGINE 150 MG PO TABS: 150.0000 mg | ORAL_TABLET | Freq: Every day | ORAL | 1 refills | Status: DC

## 2016-12-21 MED ORDER — BUTALBITAL-ACETAMINOPHEN 50-325 MG PO TABS: 1.0000 | ORAL_TABLET | Freq: Three times a day (TID) | ORAL | 0 refills | Status: DC | PRN
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
## 2016-12-21 NOTE — Patient Instructions (Signed)
Deep Vein Thrombosis A deep vein thrombosis (DVT) is a blood clot (thrombus) that usually occurs in a deep, larger vein of the lower leg or the pelvis, or in an upper extremity such as the arm. These are dangerous and can lead to serious and even life-threatening complications if the clot travels to the lungs. A DVT can damage the valves in your leg veins so that instead of flowing upward, the blood pools in the lower leg. This is called post-thrombotic syndrome, and it can result in pain, swelling, discoloration, and sores on the leg. What are the causes? A DVT is caused by the formation of a blood clot in your leg, pelvis, or arm. Usually, several things contribute to the formation of blood clots. A clot may develop when:  Your blood flow slows down.  Your vein becomes damaged in some way.  You have a condition that makes your blood clot more easily.  What increases the risk? A DVT is more likely to develop in:  People who are older, especially over 60 years of age.  People who are overweight (obese).  People who sit or lie still for a long time, such as during long-distance travel (over 4 hours), bed rest, hospitalization, or during recovery from certain medical conditions like a stroke.  People who do not engage in much physical activity (sedentary lifestyle).  People who have chronic breathing disorders.  People who have a personal or family history of blood clots or blood clotting disease.  People who have peripheral vascular disease (PVD), diabetes, or some types of cancer.  People who have heart disease, especially if the person had a recent heart attack or has congestive heart failure.  People who have neurological diseases that affect the legs (leg paresis).  People who have had a traumatic injury, such as breaking a hip or leg.  People who have recently had major or lengthy surgery, especially on the hip, knee, or abdomen.  People who have had a central line placed  inside a large vein.  People who take medicines that contain the hormone estrogen. These include birth control pills and hormone replacement therapy.  Pregnancy or during childbirth or the postpartum period.  Long plane flights (over 8 hours).  What are the signs or symptoms?  Symptoms of a DVT can include:  Swelling of your leg or arm, especially if one side is much worse.  Warmth and redness of your leg or arm, especially if one side is much worse.  Pain in your arm or leg. If the clot is in your leg, symptoms may be more noticeable or worse when you stand or walk.  A feeling of pins and needles, if the clot is in the arm.  The symptoms of a DVT that has traveled to the lungs (pulmonary embolism, PE) usually start suddenly and include:  Shortness of breath while active or at rest.  Coughing or coughing up blood or blood-tinged mucus.  Chest pain that is often worse with deep breaths.  Rapid or irregular heartbeat.  Feeling light-headed or dizzy.  Fainting.  Feeling anxious.  Sweating.  There may also be pain and swelling in a leg if that is where the blood clot started. These symptoms may represent a serious problem that is an emergency. Do not wait to see if the symptoms will go away. Get medical help right away. Call your local emergency services (911 in the [place] Do not drive yourself to the hospital. How is this diagnosed? Your health   care provider will take a medical history and perform a physical exam. You may also have other tests, including:  Blood tests to assess the clotting properties of your blood.  Imaging tests, such as CT, ultrasound, MRI, X-ray, and other tests to see if you have clots anywhere in your body.  How is this treated? After a DVT is identified, it can be treated. The type of treatment that you receive depends on many factors, such as the cause of your DVT, your risk for bleeding or developing more clots, and other medical conditions that  you have. Sometimes, a combination of treatments is necessary. Treatment options may be combined and include:  Monitoring the blood clot with ultrasound.  Taking medicines by mouth, such as newer blood thinners (anticoagulants), thrombolytics, or warfarin.  Taking anticoagulant medicine by injection or through an IV tube.  Wearing compression stockings or using different types ofdevices.  Surgery (rare) to remove the blood clot or to place a filter in your abdomen to stop the blood clot from traveling to your lungs.  Treatments for a DVT are often divided into immediate treatment and long-term treatment (up to 3 months after DVT). You can work with your health care provider to choose the treatment program that is best for you. Follow these instructions at home: If you are taking a newer oral anticoagulant:  Take the medicine every single day at the same time each day.  Understand what foods and drugs interact with this medicine.  Understand that there are no regular blood tests required when using this medicine.  Understand the side effects of this medicine, including excessive bruising or bleeding. Ask your health care provider or pharmacist about other possible side effects. If you are taking warfarin:  Understand how to take warfarin and know which foods can affect how warfarin works in your body.  Understand that it is dangerous to take too much or too little warfarin. Too much warfarin increases the risk of bleeding. Too little warfarin continues to allow the risk for blood clots.  Follow your PT and INR blood testing schedule. The PT and INR results allow your health care provider to adjust your dose of warfarin. It is very important that you have your PT and INR tested as often as told by your health care provider.  Avoid major changes in your diet, or tell your health care provider before you change your diet. Arrange a visit with a registered dietitian to answer your  questions. Many foods, especially foods that are high in vitamin K, can interfere with warfarin and affect the PT and INR results. Eat a consistent amount of foods that are high in vitamin K, such as: ? Spinach, kale, broccoli, cabbage, collard greens, turnip greens, Brussels sprouts, peas, cauliflower, seaweed, and parsley. ? Beef liver and pork liver. ? Green tea. ? Soybean oil.  Tell your health care provider about any and all medicines, vitamins, and supplements that you take, including aspirin and other over-the-counter anti-inflammatory medicines. Be especially cautious with aspirin and anti-inflammatory medicines. Do not take those before you ask your health care provider if it is safe to do so. This is important because many medicines can interfere with warfarin and affect the PT and INR results.  Do not start or stop taking any over-the-counter or prescription medicine unless your health care provider or pharmacist tells you to do so. If you take warfarin, you will also need to do these things:  Hold pressure over cuts for longer than   usual.  Tell your dentist and other health care providers that you are taking warfarin before you have any procedures in which bleeding may occur.  Avoid alcohol or drink very small amounts. Tell your health care provider if you change your alcohol intake.  Do not use tobacco products, including cigarettes, chewing tobacco, and e-cigarettes. If you need help quitting, ask your health care provider.  Avoid contact sports.  General instructions  Take over-the-counter and prescription medicines only as told by your health care provider. Anticoagulant medicines can have side effects, including easy bruising and difficulty stopping bleeding. If you are prescribed an anticoagulant, you will also need to do these things: ? Hold pressure over cuts for longer than usual. ? Tell your dentist and other health care providers that you are taking anticoagulants  before you have any procedures in which bleeding may occur. ? Avoid contact sports.  Wear a medical alert bracelet or carry a medical alert card that says you have had a PE.  Ask your health care provider how soon you can go back to your normal activities. Stay active to prevent new blood clots from forming.  Make sure to exercise while traveling or when you have been sitting or standing for a long period of time. It is very important to exercise. Exercise your legs by walking or by tightening and relaxing your leg muscles often. Take frequent walks.  Wear compression stockings as told by your health care provider to help prevent more blood clots from forming.  Do not use tobacco products, including cigarettes, chewing tobacco, and e-cigarettes. If you need help quitting, ask your health care provider.  Keep all follow-up appointments with your health care provider. This is important. How is this prevented? Take these actions to decrease your risk of developing another DVT:  Exercise regularly. For at least 30 minutes every day, engage in: ? Activity that involves moving your arms and legs. ? Activity that encourages good blood flow through your body by increasing your heart rate.  Exercise your arms and legs every hour during long-distance travel (over 4 hours). Drink plenty of water and avoid drinking alcohol while traveling.  Avoid sitting or lying in bed for long periods of time without moving your legs.  Maintain a weight that is appropriate for your height. Ask your health care provider what weight is healthy for you.  If you are a woman who is over 35 years of age, avoid unnecessary use of medicines that contain estrogen. These include birth control pills.  Do not smoke, especially if you take estrogen medicines. If you need help quitting, ask your health care provider.  If you are hospitalized, prevention measures may include:  Early walking after surgery, as soon as your  health care provider says that it is safe.  Receiving anticoagulants to prevent blood clots.If you cannot take anticoagulants, other options may be available, such as wearing compression stockings or using different types of devices.  Get help right away if:  You have new or increased pain, swelling, or redness in an arm or leg.  You have numbness or tingling in an arm or leg.  You have shortness of breath while active or at rest.  You have chest pain.  You have a rapid or irregular heartbeat.  You feel light-headed or dizzy.  You cough up blood.  You notice blood in your vomit, bowel movement, or urine. These symptoms may represent a serious problem that is an emergency. Do not wait to see   if the symptoms will go away. Get medical help right away. Call your local emergency services (911 in the [place] Do not drive yourself to the hospital. This information is not intended to replace advice given to you by your health care provider. Make sure you discuss any questions you have with your health care provider. Document Released: 03/05/2005 Document Revised: 08/11/2015 Document Reviewed: 06/30/2014 Elsevier Interactive Patient Education  2017 [employer]  

## 2016-12-22 ENCOUNTER — Other Ambulatory Visit: Payer: Self-pay | Admitting: Family Medicine

## 2016-12-22 DIAGNOSIS — M79641 Pain in right hand: Secondary | ICD-10-CM | POA: Insufficient documentation

## 2016-12-22 MED ORDER — VITAMIN D (ERGOCALCIFEROL) 1.25 MG (50000 UNIT) PO CAPS: 50000.0000 [IU] | ORAL_CAPSULE | ORAL | 4 refills | Status: DC

## 2016-12-22 NOTE — Assessment & Plan Note (Signed)
Still deficient and has not been taking her vitamin D, restart the 50000 IU weekly and add a daily 2000 IU. Continue to monitor
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
## 2016-12-25 ENCOUNTER — Ambulatory Visit (HOSPITAL_BASED_OUTPATIENT_CLINIC_OR_DEPARTMENT_OTHER)
Admission: RE | Admit: 2016-12-25 | Discharge: 2016-12-25 | Disposition: A | Discharge status: Home / Self Care | Payer: Federal, State, Local not specified - PPO | Source: Ambulatory Visit | Attending: Family Medicine | Admitting: Family Medicine

## 2016-12-25 DIAGNOSIS — M79641 Pain in right hand: Secondary | ICD-10-CM | POA: Insufficient documentation

## 2016-12-25 DIAGNOSIS — X58XXXA Exposure to other specified factors, initial encounter: Secondary | ICD-10-CM | POA: Diagnosis not present

## 2016-12-25 DIAGNOSIS — S62634A Displaced fracture of distal phalanx of right ring finger, initial encounter for closed fracture: Secondary | ICD-10-CM | POA: Diagnosis not present

## 2016-12-25 DIAGNOSIS — S62632A Displaced fracture of distal phalanx of right middle finger, initial encounter for closed fracture: Secondary | ICD-10-CM | POA: Diagnosis not present

## 2016-12-28 DIAGNOSIS — S80811A Abrasion, right lower leg, initial encounter: Secondary | ICD-10-CM | POA: Diagnosis not present

## 2017-01-14 DIAGNOSIS — M25562 Pain in left knee: Secondary | ICD-10-CM | POA: Diagnosis not present

## 2017-01-14 DIAGNOSIS — M25561 Pain in right knee: Secondary | ICD-10-CM | POA: Diagnosis not present

## 2017-01-14 DIAGNOSIS — G8929 Other chronic pain: Secondary | ICD-10-CM | POA: Diagnosis not present

## 2017-01-19 DIAGNOSIS — M25561 Pain in right knee: Secondary | ICD-10-CM | POA: Diagnosis not present

## 2017-01-19 DIAGNOSIS — G8929 Other chronic pain: Secondary | ICD-10-CM | POA: Diagnosis not present

## 2017-01-24 DIAGNOSIS — M25562 Pain in left knee: Secondary | ICD-10-CM | POA: Diagnosis not present

## 2017-01-24 DIAGNOSIS — G8929 Other chronic pain: Secondary | ICD-10-CM | POA: Diagnosis not present

## 2017-01-24 DIAGNOSIS — M25561 Pain in right knee: Secondary | ICD-10-CM | POA: Diagnosis not present

## 2017-01-28 DIAGNOSIS — S80811A Abrasion, right lower leg, initial encounter: Secondary | ICD-10-CM | POA: Diagnosis not present

## 2017-01-31 ENCOUNTER — Ambulatory Visit: Payer: Federal, State, Local not specified - PPO | Admitting: Family Medicine

## 2017-01-31 DIAGNOSIS — M25561 Pain in right knee: Secondary | ICD-10-CM | POA: Diagnosis not present

## 2017-01-31 DIAGNOSIS — M25562 Pain in left knee: Secondary | ICD-10-CM | POA: Diagnosis not present

## 2017-01-31 DIAGNOSIS — G8929 Other chronic pain: Secondary | ICD-10-CM | POA: Diagnosis not present

## 2017-02-15 DIAGNOSIS — M25561 Pain in right knee: Secondary | ICD-10-CM | POA: Diagnosis not present

## 2017-02-15 DIAGNOSIS — G8929 Other chronic pain: Secondary | ICD-10-CM | POA: Diagnosis not present

## 2017-02-15 DIAGNOSIS — M25562 Pain in left knee: Secondary | ICD-10-CM | POA: Diagnosis not present

## 2017-02-27 DIAGNOSIS — S80811A Abrasion, right lower leg, initial encounter: Secondary | ICD-10-CM | POA: Diagnosis not present
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
## 2017-03-30 DIAGNOSIS — S80811A Abrasion, right lower leg, initial encounter: Secondary | ICD-10-CM | POA: Diagnosis not present

## 2017-04-10 ENCOUNTER — Telehealth: Payer: Self-pay | Admitting: Family Medicine

## 2017-04-10 NOTE — Telephone Encounter (Signed)
Patient would like refill on flaygl 500mg  . She states she has taken in the past couple of years for bacterial vaginosis. Medication is not on current list. Will patient need appointment?

## 2017-04-10 NOTE — Telephone Encounter (Signed)
Am willing to give her a refill on the Metronidazole 500 mg tabs, 1 tab po bid x 7 days, disp #14 if no improvement then needs to come in for a visit.

## 2017-04-10 NOTE — Telephone Encounter (Signed)
Copied from CRM [id number]. Topic: Quick Communication - See Telephone Encounter >> Apr 10, 2017  3:18 PM Eston Mouldavis, Camilla Skeen B wrote: CRM for notification. See Telephone encounter for:  PT is calling for a refill  on medication but she does not know the name of the medication  just knows that it is used to treat for BV   She states shes been prescribed with for a couple years and that Dr Abner GreenspanBlyth will know the name.  [id number] [place], [place] - [address] [phone]) [phone])   04/10/17.

## 2017-04-11 MED ORDER — METRONIDAZOLE 500 MG PO TABS: 500.0000 mg | ORAL_TABLET | Freq: Two times a day (BID) | ORAL | 0 refills | Status: DC

## 2017-04-11 NOTE — Telephone Encounter (Signed)
Patient notified via mychart that prescription has been sent in to local pharmacy as requested. She will need appointment if the issue has not been resolved per Dr. Abner GreenspanBlyth.

## 2017-04-15 ENCOUNTER — Other Ambulatory Visit: Payer: Self-pay | Admitting: Family Medicine

## 2017-04-15 NOTE — Telephone Encounter (Signed)
Needs an updated uds. Needs an appt by April refills signed to cover til then

## 2017-04-15 NOTE — Telephone Encounter (Signed)
Requesting:xanax Contract:yes UDS:low risk next screen 12/01/16 Last OV:12/21/16 Next OV:not schedule  Last Refill: 10/12/16   #[id number]  Please advise

## 2017-04-16 NOTE — Telephone Encounter (Signed)
Left message for patient notifying of rx ready for pick up

## 2017-04-23 ENCOUNTER — Other Ambulatory Visit: Payer: Self-pay

## 2017-04-23 ENCOUNTER — Telehealth: Payer: Self-pay | Admitting: Family Medicine

## 2017-04-23 MED ORDER — RIZATRIPTAN BENZOATE 10 MG PO TABS: 10.0000 mg | ORAL_TABLET | ORAL | 0 refills | Status: DC | PRN

## 2017-04-23 NOTE — Telephone Encounter (Signed)
Relation to pt: self Call back number: [phone] Pharmacy: [id number] [place], [place] - [address] [phone] (Phone) [phone])    Reason for call:  Patient requesting rizatriptan (MAXALT) 10 MG tablet due to migraines, please advise

## 2017-04-23 NOTE — Telephone Encounter (Deleted)
Copied from CRM [phone]. Topic: Quick Communication - Rx Refill/Question >> Apr 23, 2017  2:18 PM Elliot GaultBell, Tiffany M wrote: Relation to pt: self Call back number: [phone] Pharmacy: [id number] [place], [place] - [address] [phone] (Phone) [phone])    Reason for call:  Patient requesting rizatriptan (MAXALT) 10 MG tablet due to migraines, please advise

## 2017-04-30 DIAGNOSIS — S80811A Abrasion, right lower leg, initial encounter: Secondary | ICD-10-CM | POA: Diagnosis not present

## 2017-05-22 ENCOUNTER — Other Ambulatory Visit: Payer: Self-pay | Admitting: Family Medicine

## 2017-05-22 MED ORDER — LAMOTRIGINE 150 MG PO TABS: 150.0000 mg | ORAL_TABLET | Freq: Every day | ORAL | 1 refills | Status: DC

## 2017-05-22 NOTE — Telephone Encounter (Signed)
Copied from CRM [phone]. Topic: Quick Communication - Rx Refill/Question >> May 22, 2017  9:26 AM Percival SpanishKennedy, Cheryl W wrote: Medication lamoTRIgine (LAMICTAL) 150 MG tablet   Has the patient contacted their pharmacy yes    [facility]   [facility]   Agent: Please be advised that RX refills may take up to 3 business days. We ask that you follow-up with your pharmacy.

## 2017-05-22 NOTE — Telephone Encounter (Signed)
LOV: 10/12/16   Dr. Abner GreenspanBlyth  [facility]

## 2017-05-22 NOTE — Telephone Encounter (Signed)
Copied from CRM [id number]. Topic: Quick Communication - Rx Refill/Question >> May 22, 2017  4:16 PM Lelon FrohlichGolden, Tashia, [place] wrote: Medication: xanax   Has the patient contacted their pharmacy? yes   (Agent: If no, request that the patient contact the pharmacy for the refill.)   Preferred Pharm[facility] (with phone number or street name[facility] [address]        Pt has appt 06/11/17 pt is out of meds   Agent: Please be advised that RX refills may take up to 3 business days. We ask that you follow-up with your pharmacy.

## 2017-05-23 ENCOUNTER — Other Ambulatory Visit: Payer: Self-pay | Admitting: Family Medicine

## 2017-05-23 MED ORDER — ALPRAZOLAM 2 MG PO TABS: 2.0000 mg | ORAL_TABLET | Freq: Every evening | ORAL | 0 refills | Status: DC | PRN

## 2017-05-23 MED ORDER — LAMOTRIGINE 150 MG PO TABS: 150.0000 mg | ORAL_TABLET | Freq: Every day | ORAL | 1 refills | Status: DC

## 2017-05-23 NOTE — Telephone Encounter (Signed)
Requesting:xanax Contract:yes UDS:low risk next screen 05/31/17 Last OV:12/21/16 Next OV:06/11/17 Last Refill:04/15/17  Database:no concerns'   Please advise

## 2017-05-28 DIAGNOSIS — S80811A Abrasion, right lower leg, initial encounter: Secondary | ICD-10-CM | POA: Diagnosis not present

## 2017-06-11 ENCOUNTER — Ambulatory Visit: Payer: Federal, State, Local not specified - PPO | Admitting: Family Medicine

## 2017-06-18 ENCOUNTER — Ambulatory Visit: Payer: Federal, State, Local not specified - PPO | Admitting: Family Medicine

## 2017-06-28 DIAGNOSIS — S80811A Abrasion, right lower leg, initial encounter: Secondary | ICD-10-CM | POA: Diagnosis not present

## 2017-07-01 DIAGNOSIS — M25561 Pain in right knee: Secondary | ICD-10-CM | POA: Diagnosis not present

## 2017-07-26 ENCOUNTER — Other Ambulatory Visit: Payer: Self-pay | Admitting: Family Medicine

## 2017-07-28 DIAGNOSIS — S80811A Abrasion, right lower leg, initial encounter: Secondary | ICD-10-CM | POA: Diagnosis not present

## 2017-07-29 NOTE — Telephone Encounter (Signed)
Requesting:xanax Contract:needs one updated  JYN:WGNF one last one 05/31/16 Last OV:12/21/16 Next OV:not scheduled  Last Refill: 05/23/17 #30-0rf Database:    Please advise

## 2017-07-29 NOTE — Telephone Encounter (Signed)
I allowed 10 tabs but she needs an appt, a contract and a UDS

## 2017-07-30 NOTE — Telephone Encounter (Signed)
Called pt no answer left voice mail to callback

## 2017-08-28 DIAGNOSIS — S80811A Abrasion, right lower leg, initial encounter: Secondary | ICD-10-CM | POA: Diagnosis not present

## 2017-09-17 ENCOUNTER — Ambulatory Visit: Payer: Federal, State, Local not specified - PPO | Admitting: Family Medicine

## 2017-09-17 ENCOUNTER — Other Ambulatory Visit: Payer: Self-pay | Admitting: Family Medicine

## 2017-09-17 ENCOUNTER — Other Ambulatory Visit: Payer: Federal, State, Local not specified - PPO

## 2017-09-17 DIAGNOSIS — Z79899 Other long term (current) drug therapy: Secondary | ICD-10-CM

## 2017-09-17 NOTE — Progress Notes (Signed)
[id number]

## 2017-09-20 LAB — PAIN MGMT, PROFILE 8 W/CONF, U
6 Acetylmorphine: NEGATIVE ng/mL (ref <10)
ALCOHOL METABOLITES: NEGATIVE ng/mL (ref <500)
AMPHETAMINES: NEGATIVE ng/mL (ref <500)
Alphahydroxyalprazolam: NEGATIVE ng/mL (ref <25)
Alphahydroxymidazolam: NEGATIVE ng/mL (ref <50)
Alphahydroxytriazolam: NEGATIVE ng/mL (ref <50)
Aminoclonazepam: NEGATIVE ng/mL (ref <25)
Benzodiazepines: NEGATIVE ng/mL (ref <100)
Buprenorphine, Urine: NEGATIVE ng/mL (ref <5)
COCAINE METABOLITE: NEGATIVE ng/mL (ref <150)
CREATININE: 202.4 mg/dL
Hydroxyethylflurazepam: NEGATIVE ng/mL (ref <50)
LORAZEPAM: NEGATIVE ng/mL (ref <50)
MDMA: NEGATIVE ng/mL (ref <500)
Marijuana Metabolite: NEGATIVE ng/mL (ref <20)
Nordiazepam: NEGATIVE ng/mL (ref <50)
OXIDANT: NEGATIVE ug/mL (ref <200)
Opiates: NEGATIVE ng/mL (ref <100)
Oxazepam: NEGATIVE ng/mL (ref <50)
Oxycodone: NEGATIVE ng/mL (ref <100)
PH: 5.87 (ref 4.5–9.0)
Temazepam: NEGATIVE ng/mL (ref <50)

## 2017-09-21 ENCOUNTER — Encounter: Payer: Self-pay | Admitting: Family Medicine

## 2017-09-23 ENCOUNTER — Telehealth: Payer: Self-pay | Admitting: Family Medicine

## 2017-09-23 NOTE — Telephone Encounter (Signed)
The 202 creatinine is from the urine so it is a different scale than in the cmp and is a blood test. This number is normal. To keep her kidenys happy she has to drink plenty of water. Minimize OTC meds and alcohol and we can monitor

## 2017-09-23 NOTE — Telephone Encounter (Signed)
Patient has seen response on mychart

## 2017-09-23 NOTE — Telephone Encounter (Signed)
Pt called with concern about her creatinine level. Pt stated that she sent a message via MyChart but had not read the reply from Dr Abner GreenspanBlyth. Message from MyChart given to pt. Pt stated she was still concerned about the creatinine of 202. She stated that she has a family h/o of kidney issues (Father and Paternal Grandfather) and was worried that she was developing kidney issues as well. Pt stated she has been having sx of : mid back sore achy pain last week and fatigue. She would like a call to discuss. Best call back number [phone].

## 2017-09-24 NOTE — Telephone Encounter (Signed)
Spoke with pt regarding her concerns about her results on mychart informed pt of your notes pt thanks you for explaining results for her.

## 2017-09-26 DIAGNOSIS — R5383 Other fatigue: Secondary | ICD-10-CM | POA: Diagnosis not present

## 2017-09-26 DIAGNOSIS — E7800 Pure hypercholesterolemia, unspecified: Secondary | ICD-10-CM | POA: Diagnosis not present

## 2017-09-26 DIAGNOSIS — N3000 Acute cystitis without hematuria: Secondary | ICD-10-CM | POA: Diagnosis not present

## 2017-09-26 DIAGNOSIS — R5381 Other malaise: Secondary | ICD-10-CM | POA: Diagnosis not present

## 2017-09-26 DIAGNOSIS — Z0000 Encounter for general adult medical examination without abnormal findings: Secondary | ICD-10-CM | POA: Diagnosis not present

## 2017-09-26 DIAGNOSIS — R7989 Other specified abnormal findings of blood chemistry: Secondary | ICD-10-CM | POA: Diagnosis not present

## 2017-09-27 DIAGNOSIS — S80811A Abrasion, right lower leg, initial encounter: Secondary | ICD-10-CM | POA: Diagnosis not present

## 2017-10-04 ENCOUNTER — Other Ambulatory Visit: Payer: Self-pay | Admitting: Family Medicine

## 2017-10-04 NOTE — Telephone Encounter (Signed)
Copied from CRM [id number]. Topic: Quick Communication - Rx Refill/Question >> Oct 04, 2017 12:28 PM Oneal GroutSebastian, Jennifer S wrote: Medication: alprazolam [name](XANAX) 2 MG tablet   Has the patient contacted their pharmacy? Yes.   (Agent: If no, request that the patient contact the pharmacy for the refill.) (Agent: If yes, when and what did the pharmacy advise?)  Preferred Pharm[facility] (with phone number [facility] street name[facility]   Agent: Please be advised that RX refills may take up to 3 business days. We ask that you follow-up with your pharmacy.

## 2017-10-07 NOTE — Telephone Encounter (Signed)
Xanax refill Last OV:12/21/16; Upcoming 11/07/17 Last refill:07/29/17 10 tab/0 refill WUJ:[id number]:Blyth Pharmacy: [id number] [place], [place] - [address] [phone] (Fax)

## 2017-10-08 MED ORDER — ALPRAZOLAM 2 MG PO TABS: 2.0000 mg | ORAL_TABLET | Freq: Every evening | ORAL | 0 refills | Status: DC | PRN

## 2017-10-08 NOTE — Telephone Encounter (Signed)
I approved 20 more but that is it til seen

## 2017-10-08 NOTE — Telephone Encounter (Signed)
Requesting:xanax Contract:needs one  UDS:low risk next screen 03/19/2018 Last OV:12/21/16 Next OV:11/07/17 Last Refill:07/29/17  #10-0rf Database:   Please advise

## 2017-10-08 NOTE — Telephone Encounter (Signed)
Appt scheduled on 11/07/2017

## 2017-10-28 DIAGNOSIS — S80811A Abrasion, right lower leg, initial encounter: Secondary | ICD-10-CM | POA: Diagnosis not present

## 2017-11-07 ENCOUNTER — Other Ambulatory Visit: Payer: Self-pay | Admitting: Family Medicine

## 2017-11-07 ENCOUNTER — Ambulatory Visit: Payer: Federal, State, Local not specified - PPO | Admitting: Family Medicine

## 2017-11-07 NOTE — Telephone Encounter (Signed)
Patient requesting Xanax again. W/o being seen.  Patient has not been seen since 12/21/2016.  She has appointment scheduled for 11/19/17  She has canceled 5 appointments with you.  She does have a UDS on file low risk her next UDS will be January 2020.   Please advise

## 2017-11-07 NOTE — Telephone Encounter (Signed)
Pt called [facility] to reschedule appt today at 2:45 pt states her boss went into labor and she can not leave work to make her appt. I rescheduled pt for 11/19/17 @ 2:15pm. Pt states she needs a refill on Xanax. She said she has already did a urine test and wants to know if this can be called in? Please advise.

## 2017-11-07 NOTE — Telephone Encounter (Signed)
She can have 1 tab a day until 11/19/2017 and no more if she does not show

## 2017-11-07 NOTE — Telephone Encounter (Signed)
Request sent to Dr. Blyth. ?

## 2017-11-08 MED ORDER — ALPRAZOLAM 2 MG PO TABS: 2.0000 mg | ORAL_TABLET | Freq: Every evening | ORAL | 0 refills | Status: DC | PRN

## 2017-11-19 ENCOUNTER — Ambulatory Visit: Payer: Federal, State, Local not specified - PPO | Admitting: Family Medicine

## 2017-11-19 ENCOUNTER — Encounter: Payer: Self-pay | Admitting: Family Medicine

## 2017-11-19 DIAGNOSIS — Z0289 Encounter for other administrative examinations: Secondary | ICD-10-CM

## 2017-11-28 DIAGNOSIS — S80811A Abrasion, right lower leg, initial encounter: Secondary | ICD-10-CM | POA: Diagnosis not present

## 2017-12-09 DIAGNOSIS — M25561 Pain in right knee: Secondary | ICD-10-CM | POA: Diagnosis not present

## 2017-12-28 DIAGNOSIS — S80811A Abrasion, right lower leg, initial encounter: Secondary | ICD-10-CM | POA: Diagnosis not present

## 2017-12-31 ENCOUNTER — Encounter: Payer: Self-pay | Admitting: Family Medicine

## 2018-01-13 DIAGNOSIS — K080 Exfoliation of teeth due to systemic causes: Secondary | ICD-10-CM | POA: Diagnosis not present

## 2018-01-14 ENCOUNTER — Ambulatory Visit: Payer: Federal, State, Local not specified - PPO | Admitting: Family Medicine

## 2018-01-14 ENCOUNTER — Encounter: Payer: Self-pay | Admitting: Family Medicine

## 2018-01-14 VITALS — BP 120/68 | HR 85 | Temp 98.3°F | Resp 18 | Ht 65.0 in | Wt 188.4 lb

## 2018-01-14 DIAGNOSIS — J40 Bronchitis, not specified as acute or chronic: Secondary | ICD-10-CM

## 2018-01-14 DIAGNOSIS — Z0000 Encounter for general adult medical examination without abnormal findings: Secondary | ICD-10-CM

## 2018-01-14 DIAGNOSIS — G43009 Migraine without aura, not intractable, without status migrainosus: Secondary | ICD-10-CM

## 2018-01-14 DIAGNOSIS — R059 Cough, unspecified: Secondary | ICD-10-CM

## 2018-01-14 DIAGNOSIS — M25561 Pain in right knee: Secondary | ICD-10-CM

## 2018-01-14 DIAGNOSIS — R05 Cough: Secondary | ICD-10-CM

## 2018-01-14 DIAGNOSIS — E559 Vitamin D deficiency, unspecified: Secondary | ICD-10-CM

## 2018-01-14 DIAGNOSIS — E785 Hyperlipidemia, unspecified: Secondary | ICD-10-CM

## 2018-01-14 DIAGNOSIS — Z720 Tobacco use: Secondary | ICD-10-CM

## 2018-01-14 DIAGNOSIS — F411 Generalized anxiety disorder: Secondary | ICD-10-CM

## 2018-01-14 DIAGNOSIS — F430 Acute stress reaction: Secondary | ICD-10-CM

## 2018-01-14 MED ORDER — ALPRAZOLAM 2 MG PO TABS: 2.0000 mg | ORAL_TABLET | Freq: Every evening | ORAL | 0 refills | Status: DC | PRN

## 2018-01-14 MED ORDER — KETOROLAC TROMETHAMINE 60 MG/2ML IM SOLN
60.0000 mg | Freq: Once | INTRAMUSCULAR | Status: AC
  Administered 2018-01-14: 60 mg via INTRAMUSCULAR

## 2018-01-14 MED ORDER — METRONIDAZOLE 500 MG PO TABS: 500.0000 mg | ORAL_TABLET | Freq: Two times a day (BID) | ORAL | 0 refills | Status: DC

## 2018-01-14 MED ORDER — DOXYCYCLINE HYCLATE 100 MG PO TABS: 100.0000 mg | ORAL_TABLET | Freq: Two times a day (BID) | ORAL | 0 refills | Status: DC

## 2018-01-14 MED ORDER — LAMOTRIGINE 150 MG PO TABS: 150.0000 mg | ORAL_TABLET | Freq: Every day | ORAL | 1 refills | Status: DC

## 2018-01-14 MED ORDER — PROPRANOLOL HCL 10 MG PO TABS: 10.0000 mg | ORAL_TABLET | Freq: Three times a day (TID) | ORAL | 2 refills | Status: DC

## 2018-01-14 NOTE — Assessment & Plan Note (Addendum)
Encouraged increased rest and hydration, add probiotics, zinc such as Coldeze or Xicam. Treat fevers as needed, elderberry, vitamin C, zinc, mucinex, given Doxycyline to use if worsens.
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
## 2018-01-14 NOTE — Assessment & Plan Note (Addendum)
Step son diagnosed with brain tumor in September, was benign and has survived surgery but very stressful refill given on Alprazolam

## 2018-01-14 NOTE — Assessment & Plan Note (Signed)
Encouraged heart healthy diet, increase exercise, avoid trans fats, consider a krill oil cap daily 

## 2018-01-14 NOTE — Assessment & Plan Note (Signed)
Supplement and monitor 

## 2018-01-14 NOTE — Patient Instructions (Addendum)
Zinc, vitamin C, elderberry, mucinex Migraine Headache A migraine headache is an intense, throbbing pain on one side or both sides of the head. Migraines may also cause other symptoms, such as nausea, vomiting, and sensitivity to light and noise. What are the causes? Doing or taking certain things may also trigger migraines, such as:  Alcohol.  Smoking.  Medicines, such as: ? Medicine used to treat chest pain (nitroglycerine). ? Birth control pills. ? Estrogen pills. ? Certain blood pressure medicines.  Aged cheeses, chocolate, or caffeine.  Foods or drinks that contain nitrates, glutamate, aspartame, or tyramine.  Physical activity.  Other things that may trigger a migraine include:  Menstruation.  Pregnancy.  Hunger.  Stress, lack of sleep, too much sleep, or fatigue.  Weather changes.  What increases the risk? The following factors may make you more likely to experience migraine headaches:  Age. Risk increases with age.  Family history of migraine headaches.  Being Caucasian.  Depression and anxiety.  Obesity.  Being a woman.  Having a hole in the heart (patent foramen ovale) or other heart problems.  What are the signs or symptoms? The main symptom of this condition is pulsating or throbbing pain. Pain may:  Happen in any area of the head, such as on one side or both sides.  Interfere with daily activities.  Get worse with physical activity.  Get worse with exposure to bright lights or loud noises.  Other symptoms may include:  Nausea.  Vomiting.  Dizziness.  General sensitivity to bright lights, loud noises, or smells.  Before you get a migraine, you may get warning signs that a migraine is developing (aura). An aura may include:  Seeing flashing lights or having blind spots.  Seeing bright spots, halos, or zigzag lines.  Having tunnel vision or blurred vision.  Having numbness or a tingling feeling.  Having trouble  talking.  Having muscle weakness.  How is this diagnosed? A migraine headache can be diagnosed based on:  Your symptoms.  A physical exam.  Tests, such as CT scan or MRI of the head. These imaging tests can help rule out other causes of headaches.  Taking fluid from the spine (lumbar puncture) and analyzing it (cerebrospinal fluid analysis, or CSF analysis).  How is this treated? A migraine headache is usually treated with medicines that:  Relieve pain.  Relieve nausea.  Prevent migraines from coming back.  Treatment may also include:  Acupuncture.  Lifestyle changes like avoiding foods that trigger migraines.  Follow these instructions at home: Medicines  Take over-the-counter and prescription medicines only as told by your health care provider.  Do not drive or use heavy machinery while taking prescription pain medicine.  To prevent or treat constipation while you are taking prescription pain medicine, your health care provider may recommend that you: ? Drink enough fluid to keep your urine clear or pale yellow. ? Take over-the-counter or prescription medicines. ? Eat foods that are high in fiber, such as fresh fruits and vegetables, whole grains, and beans. ? Limit foods that are high in fat and processed sugars, such as fried and sweet foods. Lifestyle  Avoid alcohol use.  Do not use any products that contain nicotine or tobacco, such as cigarettes and e-cigarettes. If you need help quitting, ask your health care provider.  Get at least 8 hours of sleep every night.  Limit your stress. General instructions   Keep a journal to find out what may trigger your migraine headaches. For example, write down: ?  What you eat and drink. ? How much sleep you get. ? Any change to your diet or medicines.  If you have a migraine: ? Avoid things that make your symptoms worse, such as bright lights. ? It may help to lie down in a dark, quiet room. ? Do not drive or  use heavy machinery. ? Ask your health care provider what activities are safe for you while you are experiencing symptoms.  Keep all follow-up visits as told by your health care provider. This is important. Contact a health care provider if:  You develop symptoms that are different or more severe than your usual migraine symptoms. Get help right away if:  Your migraine becomes severe.  You have a fever.  You have a stiff neck.  You have vision loss.  Your muscles feel weak or like you cannot control them.  You start to lose your balance often.  You develop trouble walking.  You faint. This information is not intended to replace advice given to you by your health care provider. Make sure you discuss any questions you have with your health care provider. Document Released: 03/05/2005 Document Revised: 09/23/2015 Document Reviewed: 08/22/2015 Elsevier Interactive Patient Education  2017 [employer]

## 2018-01-14 NOTE — Assessment & Plan Note (Signed)
Worse over past 2 months then persistent for past 4 days. Likely multifactorial, infection, stress allergies and more. Given a shot of Toradol 60 mg now and started on Propranolol 10-20 mg tid as directed and referred to neurology for further consideration
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
## 2018-01-14 NOTE — Assessment & Plan Note (Signed)
Down significantly to only a copule of cigarettes each week

## 2018-01-15 LAB — CBC WITH DIFFERENTIAL/PLATELET
Basophils Absolute: 0.1 10*3/uL (ref 0.0–0.1)
Basophils Relative: 0.9 % (ref 0.0–3.0)
EOS PCT: 2.1 % (ref 0.0–5.0)
Eosinophils Absolute: 0.2 10*3/uL (ref 0.0–0.7)
HCT: 42.7 % (ref 36.0–46.0)
HEMOGLOBIN: 14.6 g/dL (ref 12.0–15.0)
LYMPHS ABS: 3.3 10*3/uL (ref 0.7–4.0)
Lymphocytes Relative: 35.5 % (ref 12.0–46.0)
MCHC: 34.1 g/dL (ref 30.0–36.0)
MCV: 92.3 fl (ref 78.0–100.0)
MONO ABS: 0.7 10*3/uL (ref 0.1–1.0)
MONOS PCT: 7.1 % (ref 3.0–12.0)
NEUTROS PCT: 54.4 % (ref 43.0–77.0)
Neutro Abs: 5 10*3/uL (ref 1.4–7.7)
Platelets: 243 10*3/uL (ref 150.0–400.0)
RBC: 4.62 Mil/uL (ref 3.87–5.11)
RDW: 13 % (ref 11.5–15.5)
WBC: 9.2 10*3/uL (ref 4.0–10.5)

## 2018-01-15 LAB — COMPREHENSIVE METABOLIC PANEL
ALK PHOS: 61 U/L (ref 39–117)
ALT: 12 U/L (ref 0–35)
AST: 12 U/L (ref 0–37)
Albumin: 4.3 g/dL (ref 3.5–5.2)
BILIRUBIN TOTAL: 0.3 mg/dL (ref 0.2–1.2)
BUN: 13 mg/dL (ref 6–23)
CO2: 26 mEq/L (ref 19–32)
Calcium: 9.3 mg/dL (ref 8.4–10.5)
Chloride: 106 mEq/L (ref 96–112)
Creatinine, Ser: 0.92 mg/dL (ref 0.40–1.20)
GFR: 77.12 mL/min (ref >60.00)
GLUCOSE: 96 mg/dL (ref 70–99)
Potassium: 4 mEq/L (ref 3.5–5.1)
SODIUM: 140 meq/L (ref 135–145)
TOTAL PROTEIN: 6.6 g/dL (ref 6.0–8.3)

## 2018-01-15 LAB — LIPID PANEL
Cholesterol: 134 mg/dL (ref 0–200)
HDL: 36.3 mg/dL — ABNORMAL LOW (ref >39.00)
LDL Cholesterol: 60 mg/dL (ref 0–99)
NONHDL: 97.39
Total CHOL/HDL Ratio: 4
Triglycerides: 186 mg/dL — ABNORMAL HIGH (ref 0.0–149.0)
VLDL: 37.2 mg/dL (ref 0.0–40.0)

## 2018-01-15 LAB — VITAMIN D 25 HYDROXY (VIT D DEFICIENCY, FRACTURES): VITD: 28.2 ng/mL — ABNORMAL LOW (ref 30.00–100.00)

## 2018-01-15 LAB — TSH: TSH: 1.44 u[IU]/mL (ref 0.35–4.50)

## 2018-01-28 DIAGNOSIS — S80811A Abrasion, right lower leg, initial encounter: Secondary | ICD-10-CM | POA: Diagnosis not present

## 2018-02-24 ENCOUNTER — Other Ambulatory Visit: Payer: Self-pay | Admitting: Family Medicine

## 2018-02-24 DIAGNOSIS — E785 Hyperlipidemia, unspecified: Secondary | ICD-10-CM

## 2018-02-24 NOTE — Telephone Encounter (Signed)
Copied from CRM [id number]. Topic: Quick Communication - Rx Refill/Question >> Feb 24, 2018 10:55 AM Elliot GaultBell, Tiffany M wrote:  Medication: alprazolam [name](XANAX) 2 MG tablet   Has the patient contacted their pharmacy? Yes   (Agent: If yes, when and what did the pharmacy advise?) to contact PCP office  Preferred Pharmacy (with phone number or street name): [id number] - [place], [place] - [address]  Agent: Please be advised that RX refills may take up to 3 business days. We ask that you follow-up with your pharmacy.

## 2018-02-25 MED ORDER — ALPRAZOLAM 2 MG PO TABS: 2.0000 mg | ORAL_TABLET | Freq: Every evening | ORAL | 0 refills | Status: DC | PRN

## 2018-02-27 DIAGNOSIS — S80811A Abrasion, right lower leg, initial encounter: Secondary | ICD-10-CM | POA: Diagnosis not present

## 2018-02-28 ENCOUNTER — Ambulatory Visit: Payer: Federal, State, Local not specified - PPO | Admitting: Diagnostic Neuroimaging

## 2018-02-28 ENCOUNTER — Encounter: Payer: Self-pay | Admitting: Diagnostic Neuroimaging

## 2018-02-28 VITALS — BP 109/73 | HR 91 | Ht 65.0 in | Wt 187.4 lb

## 2018-02-28 DIAGNOSIS — G43109 Migraine with aura, not intractable, without status migrainosus: Secondary | ICD-10-CM

## 2018-02-28 DIAGNOSIS — G4489 Other headache syndrome: Secondary | ICD-10-CM | POA: Diagnosis not present

## 2018-02-28 MED ORDER — FREMANEZUMAB-VFRM 225 MG/1.5ML ~~LOC~~ SOSY: 225.0000 mg | PREFILLED_SYRINGE | SUBCUTANEOUS | 4 refills | Status: DC

## 2018-02-28 NOTE — Progress Notes (Signed)
[facility]  PATIENT: Jillian Reyes DOB: 1990/09/06  REFERRING CLINICIAN: Mariel Aloe HISTORY FROM: patient  REASON FOR VISIT: new consult    HISTORICAL  CHIEF COMPLAINT:  Chief Complaint  Patient presents with  . New Patient (Initial Visit)    Rm 6, alone  . Referred by DrStacey Blythe    Migraines,  Has had for years.  Tried alot of medications, still having daily headaches, migraines 1/wk.  Has tried tylenol, advil, motrin, rizatriptan, imitrex, toradol, inderal, topamax.    HISTORY OF PRESENT ILLNESS:   27 year old female here for evaluation of headaches.  Patient has had headaches is a 27 years old which she calls "regular" headaches.  She did not have any nausea, vomiting, sensitivity to light or sound.  She took over-the-counter medications and dealt with them.  Starting in 2012 patient had increasing severity of headaches with throbbing stabbing sensation, nausea, vomiting, photophobia phonophobia.  Headaches could last 1 to 2 days a time.  Sometimes associate with neck pain sinus pressure.  For past 3 years patient is averaging 5 of these headaches per week.  Patient is tried Tylenol, Advil, rizatriptan, Imitrex, Toradol, Inderal, Topamax without relief.   REVIEW OF SYSTEMS: Full 14 system review of systems performed and negative with exception of: Headache snoring restless legs dizziness depression anxiety fatigue.  ALLERGIES: Allergies  Allergen Reactions  . Cymbalta [Duloxetine Hcl] Nausea And Vomiting    HOME MEDICATIONS: Outpatient Medications Prior to Visit  Medication Sig Dispense Refill  . albuterol (PROVENTIL HFA;VENTOLIN HFA) 108 (90 Base) MCG/ACT inhaler Inhale 2 puffs into the lungs every 6 (six) hours as needed for wheezing or shortness of breath. 1 Inhaler 0  . alprazolam (XANAX) 2 MG tablet Take 1 tablet (2 mg total) by mouth at bedtime as needed. for sleep. 30 tablet 0  . clobetasol cream (TEMOVATE) 0.05 % APPLY TOPICALLY DAILY AS  NEEDED. ECZEMA 60 g 0  . doxycycline (VIBRA-TABS) 100 MG tablet Take 1 tablet (100 mg total) by mouth 2 (two) times daily. 20 tablet 0  . lamoTRIgine (LAMICTAL) 150 MG tablet Take 1 tablet (150 mg total) by mouth daily. 30 tablet 1  . metroNIDAZOLE (FLAGYL) 500 MG tablet Take 1 tablet (500 mg total) by mouth 2 (two) times daily. 14 tablet 0  . promethazine (PHENERGAN) 25 MG tablet Take 1 tablet (25 mg total) by mouth every 8 (eight) hours as needed for nausea or vomiting. 30 tablet 2  . propranolol (INDERAL) 10 MG tablet Take 1-2 tablets (10-20 mg total) by mouth 3 (three) times daily. 180 tablet 2  . rizatriptan (MAXALT) 10 MG tablet Take 1 tablet (10 mg total) by mouth as needed for migraine. May repeat in 2 hours if needed 10 tablet 0  . Vitamin D, Ergocalciferol, (DRISDOL) 50000 units CAPS capsule Take 1 capsule (50,000 Units total) by mouth every 7 (seven) days. 4 capsule 4  . ACETAMINOPHEN-BUTALBITAL 50-325 MG TABS Take 1 tablet by mouth 3 (three) times daily as needed. 30 each 0  . Bismuth Tribromoph-Petrolatum (XEROFORM PETROLAT GAUZE 5"X9") MISC Change dressings daily , uses 6 a day 150 each 1  . Cholecalciferol 2000 UNITS CAPS Take by mouth daily. Reported on 03/31/2015    . DULoxetine (CYMBALTA) 60 MG capsule Take 1 capsule (60 mg total) by mouth daily. 30 capsule 11  . gabapentin (NEURONTIN) 400 MG capsule Take by mouth.    Marland Kitchen HYDROcodone-acetaminophen (NORCO/VICODIN) 5-325 MG tablet Take 1 tablet by mouth every 6 (six) hours  as needed for moderate pain. 20 tablet 0  . silver sulfADIAZINE (SILVADENE) 1 % cream Apply 1 application topically daily. 50 g 0   No facility-administered medications prior to visit.     PAST MEDICAL HISTORY: Past Medical History:  Diagnosis Date  . Abdominal pain 10/12/2016  . Abnormal cells of cervix   . Acne 01/06/2012  . Allergic state   . Back pain, lumbosacral   . Bilateral neck pain 03/16/2016  . Bipolar 1 disorder (HCC)   . Chicken pox as a child    . Contraceptive management 01/06/2012  . Contusion of leg 10/15/2012   right  . Diabetes mellitus    gestational   . Dyslipidemia 02/18/2013  . Eczema 01/06/2012  . Eczematous dermatitis of eyelid 09/09/2014  . Encounter for preconception consultation 07/27/2013  . Folliculitis 04/10/2016  . Left ankle sprain 04/10/2016  . Mid back pain   . Right knee pain 12/21/2016  . Vitamin D deficiency 04/10/2015    PAST SURGICAL HISTORY: Past Surgical History:  Procedure Laterality Date  . NO PAST SURGERIES      FAMILY HISTORY: Family History  Problem Relation Age of Onset  . Heart disease Unknown   . Diabetes Father 30       type 2  . Thyroid disease Father   . Brain cancer Father   . Cancer Father        tumor of the brain cancer  . Migraines Mother   . Kidney disease Mother   . Colon polyps Mother   . Hyperlipidemia Unknown        great grandparents    SOCIAL HISTORY: Social History   Socioeconomic History  . Marital status: Single    Spouse name: Not on file  . Number of children: 1  . Years of education: Not on file  . Highest education level: Not on file  Occupational History  . Occupation: clerck     Comment: [employer]  Social Needs  . Financial resource strain: Not on file  . Food insecurity:    Worry: Not on file    Inability: Not on file  . Transportation needs:    Medical: Not on file    Non-medical: Not on file  Tobacco Use  . Smoking status: Current Every Day Smoker    Packs/day: 0.25    Types: Cigarettes  . Smokeless tobacco: Never Used  . Tobacco comment: 1 cigarette twice a week  Substance and Sexual Activity  . Alcohol use: Yes    Alcohol/week: 0.0 standard drinks    Comment: occ  . Drug use: No  . Sexual activity: Yes    Partners: Male    Birth control/protection: Condom    Comment: depo shot, lives with son, works for post [employer], no dietary restrictions  Lifestyle  . Physical activity:    Days per week: Not on file    Minutes per session: Not  on file  . Stress: Not on file  Relationships  . Social connections:    Talks on phone: Not on file    Gets together: Not on file    Attends religious service: Not on file    Active member of club or organization: Not on file    Attends meetings of clubs or organizations: Not on file    Relationship status: Not on file  . Intimate partner violence:    Fear of current or ex partner: Not on file    Emotionally abused: Not on file  Physically abused: Not on file    Forced sexual activity: Not on file  Other Topics Concern  . Not on file  Social History Narrative   Lives home with husband,  4 children.  Works for [employer]. Education college.     PHYSICAL EXAM  GENERAL EXAM/CONSTITUTIONAL: Vitals:  Vitals:   02/28/18 1006  BP: 109/73  Pulse: 91  Weight: 187 lb 6.4 oz (85 kg)  Height: 5\' 5"  (1.651 m)     Body mass index is 31.18 kg/m. Wt Readings from Last 3 Encounters:  02/28/18 187 lb 6.4 oz (85 kg)  01/14/18 188 lb 6.4 oz (85.5 kg)  10/12/16 178 lb 6.4 oz (80.9 kg)     Patient is in no distress; well developed, nourished and groomed; neck is supple  CARDIOVASCULAR:  Examination of carotid arteries is normal; no carotid bruits  Regular rate and rhythm, no murmurs  Examination of peripheral vascular system by observation and palpation is normal  EYES:  Ophthalmoscopic exam of optic discs and posterior segments is normal; no papilledema or hemorrhages  Visual Acuity Screening   Right eye Left eye Both eyes  Without correction:     With correction: 20/40 20/30      MUSCULOSKELETAL:  Gait, strength, tone, movements noted in Neurologic exam below  NEUROLOGIC: MENTAL STATUS:  No flowsheet data found.  awake, alert, oriented to person, place and time  recent and remote memory intact  normal attention and concentration  language fluent, comprehension intact, naming intact  fund of knowledge appropriate  CRANIAL NERVE:   2nd - no papilledema on  fundoscopic exam  2nd, 3rd, 4th, 6th - pupils equal and reactive to light, visual fields full to confrontation, extraocular muscles intact, no nystagmus  5th - facial sensation symmetric  7th - facial strength symmetric  8th - hearing intact  9th - palate elevates symmetrically, uvula midline  11th - shoulder shrug symmetric  12th - tongue protrusion midline  MOTOR:   normal bulk and tone, full strength in the BUE, BLE  SENSORY:   normal and symmetric to light touch, temperature, vibration  COORDINATION:   finger-nose-finger, fine finger movements normal  REFLEXES:   deep tendon reflexes present and symmetric  GAIT/STATION:   narrow based gait     DIAGNOSTIC DATA (LABS, IMAGING, TESTING) - I reviewed patient records, labs, notes, testing and imaging myself where available.  Lab Results  Component Value Date   WBC 9.2 01/14/2018   HGB 14.6 01/14/2018   HCT 42.7 01/14/2018   MCV 92.3 01/14/2018   PLT 243.0 01/14/2018      Component Value Date/Time   NA 140 01/14/2018 1605   K 4.0 01/14/2018 1605   CL 106 01/14/2018 1605   CO2 26 01/14/2018 1605   GLUCOSE 96 01/14/2018 1605   BUN 13 01/14/2018 1605   CREATININE 0.92 01/14/2018 1605   CREATININE 0.81 10/12/2016 1546   CALCIUM 9.3 01/14/2018 1605   PROT 6.6 01/14/2018 1605   ALBUMIN 4.3 01/14/2018 1605   AST 12 01/14/2018 1605   ALT 12 01/14/2018 1605   ALKPHOS 61 01/14/2018 1605   BILITOT 0.3 01/14/2018 1605   GFRNONAA >90 05/12/2014 1330   GFRAA >90 05/12/2014 1330   Lab Results  Component Value Date   CHOL 134 01/14/2018   HDL 36.30 (L) 01/14/2018   LDLCALC 60 01/14/2018   TRIG 186.0 (H) 01/14/2018   CHOLHDL 4 01/14/2018   No results found for: HGBA1C No results found for: [id number] Lab Results  Component Value Date   TSH 1.44 01/14/2018       ASSESSMENT AND PLAN  27 y.o. year old female here with history of headaches since age 27 years old, with change in headaches in 2012 with  migraine features.  Headaches worsening in the last 1 to 2 years.  Patient has never had neuroimaging study.  Patient has family history of brain tumor in her father and her son.  We will proceed with further work-up to rule out secondary cause of headache.  Also will start migraine preventative medication.  Dx:  1. Migraine with aura and without status migrainosus, not intractable   2. Other headache syndrome     PLAN:  - check MRI brain w/wo (worsening HA and fam hx brain tumor) - start CGRP antagonist (ajovy) for migraine prevention - continue rizatriptan 10mg  as needed for breakthrough headache; may repeat x 1 after 2 hours; max 2 tabs per day or 8 per month  Orders Placed This Encounter  Procedures  . MR BRAIN W WO CONTRAST   Meds ordered this encounter  Medications  . Fremanezumab-vfrm (AJOVY) 225 MG/1.5ML SOSY    Sig: Inject 225 mg into the skin every 30 (thirty) days.    Dispense:  3 Syringe    Refill:  4   Return in about 4 months (around 06/30/2018).    Suanne MarkerVIKRAM R. Tieshia Rettinger, MD 02/28/2018, 10:32 AM Certified in Neurology, Neurophysiology and Neuroimaging  [facility] [address], [address] [place], [place] [address] [phone]

## 2018-02-28 NOTE — Patient Instructions (Signed)
-   check MRI brain  - start ajovy  - continue rizatriptan 10mg  as needed for breakthrough headache; may repeat x 1 after 2 hours; max 2 tabs per day or 8 per month

## 2018-03-01 ENCOUNTER — Other Ambulatory Visit: Payer: Self-pay | Admitting: Diagnostic Neuroimaging

## 2018-03-03 ENCOUNTER — Telehealth: Payer: Self-pay | Admitting: *Deleted

## 2018-03-03 ENCOUNTER — Telehealth: Payer: Self-pay | Admitting: Diagnostic Neuroimaging

## 2018-03-03 MED ORDER — ERENUMAB-AOOE 140 MG/ML ~~LOC~~ SOAJ: 140.0000 mg | SUBCUTANEOUS | 11 refills | Status: DC

## 2018-03-03 NOTE — Telephone Encounter (Signed)
Ajovy Rx discontinued; Aimovig 140 mg/ mL prescribed per Dr Marjory LiesPenumalli. Called patient to advise her, and confirmed pharmacy. This RN advised she may have constipation which should be effectively treated with OTC stool softeners. Advised she can go online to register for discount medication, and gave her web site. She stated she would register tonight, verbalized understanding of call.

## 2018-03-03 NOTE — Telephone Encounter (Signed)
[employer] order sent to GI . No auth they will reach out to the pt to schedule.  °

## 2018-03-03 NOTE — Telephone Encounter (Signed)
Ok to switch to [employer]. -VRP

## 2018-03-03 NOTE — Telephone Encounter (Signed)
Unable to complete Ajovy PA on CMM, stated patient not found. Yahoo! IncCalled BC[employer] federal employee [phone], spoke with Reuel BoomDaniel who stated Ajovy is not on patient's formulary. Connected to a clinical pharmacist, Marzella SchleinJay aimovig, emgality are preferred.

## 2018-03-03 NOTE — Telephone Encounter (Signed)
Patient is aware and to give them a call if she has not heard in the next 2-3 business days.

## 2018-03-30 DIAGNOSIS — S80811A Abrasion, right lower leg, initial encounter: Secondary | ICD-10-CM | POA: Diagnosis not present

## 2018-04-01 ENCOUNTER — Telehealth: Payer: Self-pay | Admitting: *Deleted

## 2018-04-01 ENCOUNTER — Other Ambulatory Visit: Payer: Self-pay | Admitting: Family Medicine

## 2018-04-01 DIAGNOSIS — E785 Hyperlipidemia, unspecified: Secondary | ICD-10-CM

## 2018-04-01 NOTE — Telephone Encounter (Signed)
Received approval for Aimovig 140mg /ml AJ valid 03/01/2018 thru 09-27-2018. [employer] [phone], fax [phone].  This is under pts last name of [name].  KEY [id number].  Diagnosis G43.109, G44.89.  Tried tylenol, advil, motrin, rizatriptan, imitrex, toradol, inderal, topamax.  Fax to [facility] [phone]. (received confirmation).

## 2018-04-01 NOTE — Telephone Encounter (Signed)
Requesting:xanax Contract:yes UDS:09/17/17  Next screen low risk 03/20/2018 Last OV:01/14/18 Next OV:not schedule  Last Refill:02/25/18  #[id number] Database:   Please advise

## 2018-04-14 ENCOUNTER — Ambulatory Visit: Payer: Federal, State, Local not specified - PPO | Admitting: Family

## 2018-04-14 ENCOUNTER — Other Ambulatory Visit (HOSPITAL_BASED_OUTPATIENT_CLINIC_OR_DEPARTMENT_OTHER): Payer: Federal, State, Local not specified - PPO

## 2018-04-14 ENCOUNTER — Encounter: Payer: Self-pay | Admitting: Family

## 2018-04-14 VITALS — BP 111/68 | HR 64 | Temp 98.3°F | Resp 16 | Ht 65.0 in | Wt 192.0 lb

## 2018-04-14 DIAGNOSIS — R102 Pelvic and perineal pain: Secondary | ICD-10-CM

## 2018-04-14 LAB — POC URINALSYSI DIPSTICK (AUTOMATED)
Bilirubin, UA: NEGATIVE
Blood, UA: NEGATIVE
Glucose, UA: NEGATIVE
Leukocytes, UA: NEGATIVE
NITRITE UA: NEGATIVE
Protein, UA: NEGATIVE
Spec Grav, UA: 1.015 (ref 1.010–1.025)
UROBILINOGEN UA: NEGATIVE U/dL — AB
pH, UA: 5.5 (ref 5.0–8.0)

## 2018-04-14 LAB — POCT URINE PREGNANCY: Preg Test, Ur: NEGATIVE

## 2018-04-14 NOTE — Patient Instructions (Signed)
Please return to imaging on the first floor at 6:15 for a 6:30 ultrasound. Please arrive with a full bladder and drink 32 oz water one hour before the ultrasound. You should be contacted about your referral to GYN.

## 2018-04-14 NOTE — Progress Notes (Signed)
Subjective:    Patient ID: Jillian Reyes, female    DOB: 07/15/1990, 28 y.o.   MRN: [id number]  HPI  Patient is a 28 yr old female who presents today with chief complaint of right sided pelvic pain.  Reports that she has been on depo in the past.  Reports that she has irregular periods.  + dyspareunia.  Had diarrhea today. Has not eaten anything today.  LMP October.  Not currently on birth control.     Review of Systems   See HPI  Past Medical History:  Diagnosis Date  . Abdominal pain 10/12/2016  . Abnormal cells of cervix   . Acne 01/06/2012  . Allergic state   . Back pain, lumbosacral   . Bilateral neck pain 03/16/2016  . Bipolar 1 disorder (HCC)   . Chicken pox as a child  . Contraceptive management 01/06/2012  . Contusion of leg 10/15/2012   right  . Diabetes mellitus    gestational   . Dyslipidemia 02/18/2013  . Eczema 01/06/2012  . Eczematous dermatitis of eyelid 09/09/2014  . Encounter for preconception consultation 07/27/2013  . Folliculitis 04/10/2016  . Left ankle sprain 04/10/2016  . Mid back pain   . Right knee pain 12/21/2016  . Vitamin D deficiency 04/10/2015     Social History   Socioeconomic History  . Marital status: Single    Spouse name: Not on file  . Number of children: 1  . Years of education: Not on file  . Highest education level: Not on file  Occupational History  . Occupation: clerck     Comment: [employer]  Social Needs  . Financial resource strain: Not on file  . Food insecurity:    Worry: Not on file    Inability: Not on file  . Transportation needs:    Medical: Not on file    Non-medical: Not on file  Tobacco Use  . Smoking status: Current Every Day Smoker    Packs/day: 0.25    Types: Cigarettes  . Smokeless tobacco: Never Used  . Tobacco comment: 1 cigarette twice a week  Substance and Sexual Activity  . Alcohol use: Yes    Alcohol/week: 0.0 standard drinks    Comment: occ  . Drug use: No  . Sexual activity: Yes    Partners:  Male    Birth control/protection: Condom    Comment: depo shot, lives with son, works for post office, no dietary restrictions  Lifestyle  . Physical activity:    Days per week: Not on file    Minutes per session: Not on file  . Stress: Not on file  Relationships  . Social connections:    Talks on phone: Not on file    Gets together: Not on file    Attends religious service: Not on file    Active member of club or organization: Not on file    Attends meetings of clubs or organizations: Not on file    Relationship status: Not on file  . Intimate partner violence:    Fear of current or ex partner: Not on file    Emotionally abused: Not on file    Physically abused: Not on file    Forced sexual activity: Not on file  Other Topics Concern  . Not on file  Social History Narrative   Lives home with husband,  4 children.  Works for [employer]. Education college.    Past Surgical History:  Procedure Laterality Date  . NO PAST SURGERIES  Family History  Problem Relation Age of Onset  . Heart disease Unknown   . Diabetes Father 32       type 2  . Thyroid disease Father   . Brain cancer Father   . Cancer Father        tumor of the brain cancer  . Migraines Mother   . Kidney disease Mother   . Colon polyps Mother   . Hyperlipidemia Unknown        great grandparents    Allergies  Allergen Reactions  . Cymbalta [Duloxetine Hcl] Nausea And Vomiting    Current Outpatient Medications on File Prior to Visit  Medication Sig Dispense Refill  . albuterol (PROVENTIL HFA;VENTOLIN HFA) 108 (90 Base) MCG/ACT inhaler Inhale 2 puffs into the lungs every 6 (six) hours as needed for wheezing or shortness of breath. 1 Inhaler 0  . alprazolam (XANAX) 2 MG tablet TAKE 1 TABLET (2 MG TOTAL) BY MOUTH AT BEDTIME AS NEEDED. FOR SLEEP. 30 tablet 0  . clobetasol cream (TEMOVATE) 0.05 % APPLY TOPICALLY DAILY AS NEEDED. ECZEMA 60 g 0  . Erenumab-aooe (AIMOVIG) 140 MG/ML SOAJ Inject 140 mg into the  skin every 30 (thirty) days. 1 pen 11  . lamoTRIgine (LAMICTAL) 150 MG tablet Take 1 tablet (150 mg total) by mouth daily. 30 tablet 1  . promethazine (PHENERGAN) 25 MG tablet Take 1 tablet (25 mg total) by mouth every 8 (eight) hours as needed for nausea or vomiting. 30 tablet 2  . propranolol (INDERAL) 10 MG tablet Take 1-2 tablets (10-20 mg total) by mouth 3 (three) times daily. 180 tablet 2  . rizatriptan (MAXALT) 10 MG tablet Take 1 tablet (10 mg total) by mouth as needed for migraine. May repeat in 2 hours if needed 10 tablet 0  . Vitamin D, Ergocalciferol, (DRISDOL) 50000 units CAPS capsule Take 1 capsule (50,000 Units total) by mouth every 7 (seven) days. 4 capsule 4  . doxycycline (VIBRA-TABS) 100 MG tablet Take 1 tablet (100 mg total) by mouth 2 (two) times daily. (Patient not taking: Reported on 04/14/2018) 20 tablet 0  . metroNIDAZOLE (FLAGYL) 500 MG tablet Take 1 tablet (500 mg total) by mouth 2 (two) times daily. (Patient not taking: Reported on 04/14/2018) 14 tablet 0  . [DISCONTINUED] naproxen (NAPROSYN) 375 MG tablet Take 1 tablet (375 mg total) by mouth 2 (two) times daily with a meal. 360 tablet 0   No current facility-administered medications on file prior to visit.     BP 111/68 (BP Location: Left Arm, Patient Position: Sitting, Cuff Size: Small)   Pulse 64   Temp 98.3 F (36.8 C) (Oral)   Resp 16   Ht 5\' 5"  (1.651 m)   Wt 192 lb (87.1 kg)   SpO2 98%   BMI 31.95 kg/m        Objective:   Physical Exam Constitutional:      Appearance: She is well-developed.  Neck:     Musculoskeletal: Neck supple.     Thyroid: No thyromegaly.  Cardiovascular:     Rate and Rhythm: Normal rate and regular rhythm.     Heart sounds: Normal heart sounds. No murmur.  Pulmonary:     Effort: Pulmonary effort is normal. No respiratory distress.     Breath sounds: Normal breath sounds. No wheezing.  Abdominal:     Comments: + RLQ tenderness to palpation  Skin:    General: Skin is  warm and dry.  Neurological:     Mental Status:  She is alert and oriented to person, place, and time.  Psychiatric:        Behavior: Behavior normal.        Thought Content: Thought content normal.        Judgment: Judgment normal.   GU:  + tenderness to palpation of right lower abdomen without guarding.  + mild adnexal tenderness on bimanual exam on right, no tenderness on left. No palpable masses.         Assessment & Plan:  Adnexal pain- urine hCG is negative. UA unremarkable. Will obtain a pelvic US to rule out ovarian cyst.  She is also concerned about the possibility of endometriosis since she will often have heavy cramping and has multiple family members with endometriosis. Will refer to GYN for further evaluation. Pt is advised to go to the ER if symptoms become severe. Of note we scheduled pt for this evening but she then went down to imaging and changed apt until tomorrow due to a scheduling conflict.

## 2018-04-15 ENCOUNTER — Ambulatory Visit (HOSPITAL_BASED_OUTPATIENT_CLINIC_OR_DEPARTMENT_OTHER)
Admission: RE | Admit: 2018-04-15 | Discharge: 2018-04-15 | Disposition: A | Discharge status: Home / Self Care | Payer: Federal, State, Local not specified - PPO | Source: Ambulatory Visit | Attending: Family | Admitting: Family

## 2018-04-15 ENCOUNTER — Telehealth: Payer: Self-pay | Admitting: Family

## 2018-04-15 DIAGNOSIS — R102 Pelvic and perineal pain: Secondary | ICD-10-CM | POA: Diagnosis not present

## 2018-04-15 DIAGNOSIS — N83291 Other ovarian cyst, right side: Secondary | ICD-10-CM | POA: Diagnosis not present

## 2018-04-15 NOTE — Telephone Encounter (Signed)
Ultrasound reviewed.

## 2018-04-16 NOTE — Telephone Encounter (Signed)
Reviewed results with patient. She has appointment with GYN tomorrow. Results forwarded to Dr. Adrian Blackwater. She is aware that she will need to repeat US in 6-12 weeks and was reminded to go to the ER if she develops severe abdominal pain. Pt verbalizes understanding.

## 2018-04-17 ENCOUNTER — Encounter: Payer: Self-pay | Admitting: Family Medicine

## 2018-04-17 ENCOUNTER — Ambulatory Visit: Payer: Federal, State, Local not specified - PPO | Admitting: Family Medicine

## 2018-04-17 VITALS — BP 117/62 | HR 68 | Wt 191.0 lb

## 2018-04-17 DIAGNOSIS — N83201 Unspecified ovarian cyst, right side: Secondary | ICD-10-CM | POA: Diagnosis not present

## 2018-04-17 MED ORDER — DICLOFENAC SODIUM 50 MG PO TBEC: 50.0000 mg | DELAYED_RELEASE_TABLET | Freq: Three times a day (TID) | ORAL | 3 refills | Status: DC

## 2018-04-17 NOTE — Progress Notes (Signed)
   Subjective:    Patient ID: Jillian Reyes, female    DOB: [birth year], 28 y.o.   MRN: [id number]  HPI Patient seen for right hemorrhagic cyst. Complains of intermittent dull aching right pelvic pain since April. Became worse and constant a couple months ago, worse with intercourse (unable to have sex for 2 months). Had sudden onset of severe pain earlier this week. Went to PCP, then went for pelvic US, showing 4cm likely hemorrhagic cyst. Has been taking ibuprofen, which helps to take the edge off, but still extremely uncomfortable.   Husband has had vasectomy, doesn't want any more children.   Review of Systems     Objective:   Physical Exam Constitutional:      Appearance: Normal appearance.  Cardiovascular:     Rate and Rhythm: Normal rate and regular rhythm.     Pulses: Normal pulses.  Pulmonary:     Effort: Pulmonary effort is normal. No respiratory distress.     Breath sounds: Normal breath sounds. No stridor. No wheezing or rhonchi.  Abdominal:     General: Abdomen is flat. There is no distension.     Palpations: Abdomen is soft. There is no mass.     Tenderness: There is abdominal tenderness (RLQ/ right pelvic pain). There is no guarding or rebound.  Neurological:     Mental Status: She is alert.  Psychiatric:        Mood and Affect: Mood normal.        Behavior: Behavior normal.        Thought Content: Thought content normal.        Judgment: Judgment normal.       Assessment & Plan:  1. Hemorrhagic cyst of right ovary Discussed that we try not to operate on hemorrhagic cyst and that usually improves with conservative management. She would like consult with Gyn surgeon - will arrange. Diclofenac in the interim. Will arrange short follow up. Go to ED if pain increases and becomes severe.

## 2018-04-17 NOTE — Progress Notes (Signed)
Patient states that she is having irregular periods and has a cyst on her right ovary. Patient states last period was in OCT 2019. Patient states that she stopped the Depo "years ago".  Patient was seen in our office in 01/2015 with amenorrhea. Armandina Stammer RN

## 2018-04-25 ENCOUNTER — Ambulatory Visit (INDEPENDENT_AMBULATORY_CARE_PROVIDER_SITE_OTHER): Payer: Federal, State, Local not specified - PPO | Admitting: Obstetrics & Gynecology

## 2018-04-25 ENCOUNTER — Encounter: Payer: Self-pay | Admitting: Obstetrics & Gynecology

## 2018-04-25 VITALS — BP 103/57 | HR 72 | Ht 65.0 in | Wt 191.0 lb

## 2018-04-25 DIAGNOSIS — N83201 Unspecified ovarian cyst, right side: Secondary | ICD-10-CM

## 2018-04-25 DIAGNOSIS — N946 Dysmenorrhea, unspecified: Secondary | ICD-10-CM

## 2018-04-25 MED ORDER — NORETHIN ACE-ETH ESTRAD-FE 1-20 MG-MCG(24) PO TABS: 1.0000 | ORAL_TABLET | Freq: Every day | ORAL | 11 refills | Status: DC

## 2018-04-25 NOTE — Patient Instructions (Signed)
Ovarian Cyst         An ovarian cyst is a fluid-filled sac that forms on an ovary. The ovaries are small organs that produce eggs in women. Various types of cysts can form on the ovaries. Some may cause symptoms and require treatment. Most ovarian cysts go away on their own, are not cancerous (are benign), and do not cause problems.  Common types of ovarian cysts include:  · Functional (follicle) cysts.  ? Occur during the menstrual cycle, and usually go away with the next menstrual cycle if you do not get pregnant.  ? Usually cause no symptoms.  · Endometriomas.  ? Are cysts that form from the tissue that lines the uterus (endometrium).  ? Are sometimes called “chocolate cysts” because they become filled with blood that turns brown.  ? Can cause pain in the lower abdomen during intercourse and during your period.  · Cystadenoma cysts.  ? Develop from cells on the outside surface of the ovary.  ? Can get very large and cause lower abdomen pain and pain with intercourse.  ? Can cause severe pain if they twist or break open (rupture).  · Dermoid cysts.  ? Are sometimes found in both ovaries.  ? May contain different kinds of body tissue, such as skin, teeth, hair, or cartilage.  ? Usually do not cause symptoms unless they get very big.  · Theca lutein cysts.  ? Occur when too much of a certain hormone (human chorionic gonadotropin) is produced and overstimulates the ovaries to produce an egg.  ? Are most common after having procedures used to assist with the conception of a baby (in vitro fertilization).  What are the causes?  Ovarian cysts may be caused by:  · Ovarian hyperstimulation syndrome. This is a condition that can develop from taking fertility medicines. It causes multiple large ovarian cysts to form.  · Polycystic ovarian syndrome (PCOS). This is a common hormonal disorder that can cause ovarian cysts, as well as problems with your period or fertility.  What increases the risk?  The following factors may  make you more likely to develop ovarian cysts:  · Being overweight or obese.  · Taking fertility medicines.  · Taking certain forms of hormonal birth control.  · Smoking.  What are the signs or symptoms?  Many ovarian cysts do not cause symptoms. If symptoms are present, they may include:  · Pelvic pain or pressure.  · Pain in the lower abdomen.  · Pain during sex.  · Abdominal swelling.  · Abnormal menstrual periods.  · Increasing pain with menstrual periods.  How is this diagnosed?  These cysts are commonly found during a routine pelvic exam. You may have tests to find out more about the cyst, such as:  · Ultrasound.  · X-ray of the pelvis.  · CT scan.  · MRI.  · Blood tests.  How is this treated?  Many ovarian cysts go away on their own without treatment. Your health care provider may want to check your cyst regularly for 2-3 months to see if it changes. If you are in menopause, it is especially important to have your cyst monitored closely because menopausal women have a higher rate of ovarian cancer.  When treatment is needed, it may include:  · Medicines to help relieve pain.  · A procedure to drain the cyst (aspiration).  · Surgery to remove the whole cyst.  · Hormone treatment or birth control pills. These methods are sometimes used   to help dissolve a cyst.  Follow these instructions at home:  · Take over-the-counter and prescription medicines only as told by your health care provider.  · Do not drive or use heavy machinery while taking prescription pain medicine.  · Get regular pelvic exams and Pap tests as often as told by your health care provider.  · Return to your normal activities as told by your health care provider. Ask your health care provider what activities are safe for you.  · Do not use any products that contain nicotine or tobacco, such as cigarettes and e-cigarettes. If you need help quitting, ask your health care provider.  · Keep all follow-up visits as told by your health care provider.  This is important.  Contact a health care provider if:  · Your periods are late, irregular, or painful, or they stop.  · You have pelvic pain that does not go away.  · You have pressure on your bladder or trouble emptying your bladder completely.  · You have pain during sex.  · You have any of the following in your abdomen:  ? A feeling of fullness.  ? Pressure.  ? Discomfort.  ? Pain that does not go away.  ? Swelling.  · You feel generally ill.  · You become constipated.  · You lose your appetite.  · You develop severe acne.  · You start to have more body hair and facial hair.  · You are gaining weight or losing weight without changing your exercise and eating habits.  · You think you may be pregnant.  Get help right away if:  · You have abdominal pain that is severe or gets worse.  · You cannot eat or drink without vomiting.  · You suddenly develop a fever.  · Your menstrual period is much heavier than usual.  This information is not intended to replace advice given to you by your health care provider. Make sure you discuss any questions you have with your health care provider.  Document Released: 03/05/2005 Document Revised: 09/23/2015 Document Reviewed: 08/07/2015  Elsevier Interactive Patient Education © 2019 [employer]

## 2018-04-25 NOTE — Progress Notes (Signed)
loHistory:  28 y.o. G2P0011 here today for eval of ov cyst. Pt reports that she wants to have the cyst removed. She reports a h/o ov cysts prev and reports taht   The following portions of the patient's history were reviewed and updated as appropriate: allergies, current medications, past family history, past medical history, past social history, past surgical history and problem list.  Review of Systems:  Pertinent items are noted in HPI.    Objective:  Physical Exam Blood pressure (!) 103/57, pulse 72, height 5\' 5"  (1.651 m), weight 191 lb (86.6 kg), last menstrual period 01/03/2018. CONSTITUTIONAL: Well-developed, well-nourished female in no acute distress.  HENT:  Normocephalic, atraumatic EYES: Conjunctivae and EOM are normal. No scleral icterus.  NECK: Normal range of motion SKIN: Skin is warm and dry. No rash noted. Not diaphoretic.No pallor. NEUROLGIC: Alert and oriented to person, place, and time. Normal coordination.  Lungs: CTA CV: RRR Abd: Soft, nontender and nondistended Pelvic: Normal appearing external genitalia; normal appearing vaginal mucosa and cervix.  Normal discharge.  Small uterus, no other palpable masses, no uterine tenderness; there is mild tenderness to palpation on the right side. No adnexal masses palpated.    Labs and Imaging US Pelvic Complete With Transvaginal  Result Date: 04/15/2018 CLINICAL DATA:  RIGHT adnexal pain for 3-4 days question ovarian cyst; past history of LEFT ovarian cyst EXAM: TRANSABDOMINAL AND TRANSVAGINAL ULTRASOUND OF PELVIS TECHNIQUE: Both transabdominal and transvaginal ultrasound examinations of the pelvis were performed. Transabdominal technique was performed for global imaging of the pelvis including uterus, ovaries, adnexal regions, and pelvic cul-de-sac. It was necessary to proceed with endovaginal exam following the transabdominal exam to visualize the uterus, and to characterize a RIGHT ovarian lesion. COMPARISON:  None FINDINGS:  Uterus Measurements: 7.9 x 3.8 x 5.0 cm = volume: 80 mL. Normal morphology without mass Endometrium Thickness: 9 mm. Tiny amount of endometrial fluid at lower uterine segment. Endometrial complex otherwise unremarkable. Right ovary Measurements: 5.9 x 5.4 x 5.5 cm = volume: 93 mL. Complex cystic lesion within RIGHT ovary 4.8 x 3.8 x 4.4 cm containing a questionable partial septation and scattered low level internal echogenicity, consistent with the complicated or potentially hemorrhagic cyst. No definite mural nodularity. Blood flow present within the surrounding RIGHT ovary on color Doppler imaging. Left ovary Measurements: 3.4 x 2.2 x 2.8 cm = volume: 11 mL. Normal morphology without mass. Internal blood flow present on color Doppler imaging. Other findings No free pelvic fluid or other adnexal masses. IMPRESSION: Unremarkable uterus and LEFT ovary. Complex cystic lesion RIGHT ovary 4.8 x 3.8 x 4.4 cm question hemorrhagic cyst. Short-interval follow up ultrasound in 6-12 weeks is recommended, preferably during the week following the patient's normal menses, to confirm hemorrhagic cyst evolution and exclude cystic neoplasm. Electronically Signed   By: Ulyses Southward M.D.   On: 04/15/2018 20:06    Assessment & Plan:  Right adnexal mass- appears to be a hemorrhagic cyst.   Review tx options. Lesions likely benign. I do not rec surgical eval at this  Time  LoEstrin 1/20 1 po q day  NSAIDS (pt declined prescription dose NSAIDS)  F/u US in 6 weeks  F/u OV in 6 weeks AFTER [place]  Total face-to-face time with patient was 25 min.  Greater than 50% was spent in counseling and coordination of care with the patient.   Zeddie Njie L. Harraway-Smith, M.D., [place] [place]

## 2018-04-30 DIAGNOSIS — S80811A Abrasion, right lower leg, initial encounter: Secondary | ICD-10-CM | POA: Diagnosis not present

## 2018-05-12 ENCOUNTER — Other Ambulatory Visit: Payer: Self-pay | Admitting: Family Medicine

## 2018-05-12 ENCOUNTER — Telehealth: Payer: Self-pay | Admitting: Family Medicine

## 2018-05-12 DIAGNOSIS — E785 Hyperlipidemia, unspecified: Secondary | ICD-10-CM

## 2018-05-12 MED ORDER — FLUCONAZOLE 150 MG PO TABS: 150.0000 mg | ORAL_TABLET | ORAL | 1 refills | Status: DC

## 2018-05-12 MED ORDER — ALPRAZOLAM 2 MG PO TABS: 2.0000 mg | ORAL_TABLET | Freq: Every evening | ORAL | 0 refills | Status: DC | PRN

## 2018-05-12 NOTE — Telephone Encounter (Signed)
Let her know I sent her some Diflucan to take weekly x 2 weeks

## 2018-05-12 NOTE — Telephone Encounter (Signed)
Copied from CRM [id number]. Topic: Quick Communication - Rx Refill/Question >> May 12, 2018  1:11 PM Fanny Bien wrote: Medication:alprazolam [name]) 2 MG tablet [[id number]]    Has the patient contacted their pharmacy? no Preferred Pharmacy (with phone number or street name):[id number] [place],  - [address] [phone] (Phone) [phone] (Fax)   Agent: Please be advised that RX refills may take up to 3 business days. We ask that you follow-up with your pharmacy.

## 2018-05-12 NOTE — Telephone Encounter (Signed)
Copied from CRM [id number]. Topic: Quick Communication - See Telephone Encounter >> May 12, 2018  1:13 PM Fanny Bien wrote: CRM for notification. See Telephone encounter for: 05/12/18. Pt called and  stated that she has a yeast infection and would like something called in. Please advise

## 2018-05-13 NOTE — Telephone Encounter (Signed)
Patient notifed

## 2018-06-03 ENCOUNTER — Telehealth: Payer: Self-pay | Admitting: Family Medicine

## 2018-06-03 DIAGNOSIS — E785 Hyperlipidemia, unspecified: Secondary | ICD-10-CM

## 2018-06-03 MED ORDER — ALPRAZOLAM 2 MG PO TABS: 2.0000 mg | ORAL_TABLET | Freq: Every evening | ORAL | 1 refills | Status: DC | PRN

## 2018-06-03 NOTE — Telephone Encounter (Signed)
She has to wait til it is due

## 2018-06-03 NOTE — Telephone Encounter (Signed)
Please advise if okay to refill early?

## 2018-06-03 NOTE — Telephone Encounter (Signed)
Copied from CRM [id number]. Topic: Quick Communication - Rx Refill/Question >> Jun 03, 2018 11:51 AM Arlyss Gandy, NT wrote: Medication: alprazolam [name]) 2 MG tablet   Has the patient contacted their pharmacy? Yes.   (Agent: If no, request that the patient contact the pharmacy for the refill.) (Agent: If yes, when and what did the pharmacy advise?)  Preferred Pharmacy (with phone number or street name): [id number] [place], [place] - [address] [phone] (Phone) [phone] (Fax)    Agent: Please be advised that RX refills may take up to 3 business days. We ask that you follow-up with your pharmacy.

## 2018-06-03 NOTE — Telephone Encounter (Signed)
Pt calling.  States that pharmacy will not give her this medication because they say it is too early to refill.  Pt would like someone to call the pharmacy and let them know she is allowed to have this now.

## 2018-06-04 ENCOUNTER — Ambulatory Visit: Payer: Federal, State, Local not specified - PPO | Admitting: Obstetrics & Gynecology

## 2018-06-04 NOTE — Telephone Encounter (Addendum)
[facility] and was advised that the patient has to wait until it is due. Nothing further needed

## 2018-06-08 ENCOUNTER — Other Ambulatory Visit: Payer: Self-pay | Admitting: Family Medicine

## 2018-06-08 ENCOUNTER — Other Ambulatory Visit: Payer: Self-pay | Admitting: Internal Medicine

## 2018-06-08 DIAGNOSIS — L509 Urticaria, unspecified: Secondary | ICD-10-CM

## 2018-06-08 DIAGNOSIS — L709 Acne, unspecified: Secondary | ICD-10-CM

## 2018-06-09 ENCOUNTER — Other Ambulatory Visit: Payer: Self-pay | Admitting: Family Medicine

## 2018-06-09 MED ORDER — RIZATRIPTAN BENZOATE 10 MG PO TABS: 10.0000 mg | ORAL_TABLET | ORAL | 0 refills | Status: DC | PRN

## 2018-06-09 MED ORDER — ALBUTEROL SULFATE HFA 108 (90 BASE) MCG/ACT IN AERS: 2.0000 | INHALATION_SPRAY | Freq: Four times a day (QID) | RESPIRATORY_TRACT | 0 refills | Status: DC | PRN

## 2018-06-09 MED ORDER — METRONIDAZOLE 500 MG PO TABS: 500.0000 mg | ORAL_TABLET | Freq: Two times a day (BID) | ORAL | 0 refills | Status: DC

## 2018-06-09 MED ORDER — CLOBETASOL PROPIONATE 0.05 % EX CREA: TOPICAL_CREAM | CUTANEOUS | 0 refills | Status: DC

## 2018-06-18 ENCOUNTER — Encounter: Payer: Self-pay | Admitting: Family Medicine

## 2018-06-18 ENCOUNTER — Other Ambulatory Visit: Payer: Self-pay

## 2018-06-18 ENCOUNTER — Ambulatory Visit (INDEPENDENT_AMBULATORY_CARE_PROVIDER_SITE_OTHER): Payer: Federal, State, Local not specified - PPO | Admitting: Family Medicine

## 2018-06-18 VITALS — BP 124/68 | HR 96 | Temp 97.9°F | Resp 18 | Ht 65.0 in | Wt 192.0 lb

## 2018-06-18 DIAGNOSIS — L0231 Cutaneous abscess of buttock: Secondary | ICD-10-CM | POA: Diagnosis not present

## 2018-06-18 MED ORDER — DOXYCYCLINE HYCLATE 100 MG PO TABS: 100.0000 mg | ORAL_TABLET | Freq: Two times a day (BID) | ORAL | 0 refills | Status: DC

## 2018-06-18 NOTE — Patient Instructions (Signed)
Continue to use warm compresses or warm tub soaks at least twice a day for the next few days- this will encourage any pus to drain Keep a bandage over the area as needed to protect your clothing Use the doxycycline twice a day for 5 days to kill bacteria Let me know if any concerns or if this does not seem to be getting better

## 2018-06-18 NOTE — Progress Notes (Signed)
[facility] [address], [address] [place], [place] [address] [phone]  Date:  06/18/2018   Name:  Jillian Reyes   DOB:  15-Aug-1990   MRN:  [id number]  PCP:  Bradd Canary, MD    Chief Complaint: Buttock Abcess (couple of weeks, of and on)   History of Present Illness:  Jillian Reyes is a 28 y.o. 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 Bronchitis 01/14/2018  . Hypocalcemia 12/22/2016  . Right hand pain 12/22/2016  . Bilateral calf pain 12/21/2016  . Right knee pain 12/21/2016  . Hemorrhoids 12/01/2016  . History of hidradenitis suppurativa 10/14/2016  . Abdominal pain 10/12/2016  . Left ankle sprain 04/10/2016  . Folliculitis 04/10/2016  . Bilateral neck pain 03/16/2016  . Preventative health care 04/10/2015  . Vitamin D deficiency 04/10/2015  . Dysuria 01/10/2014  . Allergic rhinitis 11/30/2013  . Migraine without aura and without status migrainosus, not intractable 11/17/2013  . Encounter for preconception consultation 07/27/2013  . Insomnia 07/02/2013  . Tendinopathy of rotator cuff 03/08/2013  . Dyslipidemia 02/18/2013  . Contraceptive management 01/06/2012  . Eczema 01/06/2012  . Acne 01/06/2012  . Hx of gestational diabetes mellitus, not currently  pregnant   . Mid back pain   . Allergic state   . Abnormal cells of cervix   . Anxiety as acute reaction to exceptional stress 05/01/2011  . Bipolar 1 disorder (HCC) 07/05/2010  . Tobacco abuse 07/05/2010    Past Medical History:  Diagnosis Date  . Abdominal pain 10/12/2016  . Abnormal cells of cervix   . Acne 01/06/2012  . Allergic state   . Back pain, lumbosacral   . Bilateral neck pain 03/16/2016  . Bipolar 1 disorder (HCC)   . Chicken pox as a child  . Contraceptive management 01/06/2012  . Contusion of leg 10/15/2012   right  . Diabetes mellitus    gestational   . Dyslipidemia 02/18/2013  . Eczema 01/06/2012  . Eczematous dermatitis of eyelid 09/09/2014  . Encounter for preconception consultation 07/27/2013  . Folliculitis 04/10/2016  . Left ankle sprain 04/10/2016  . Mid back pain   . Right knee pain 12/21/2016  . Vitamin D deficiency 04/10/2015    Past Surgical History:  Procedure Laterality Date  . NO PAST SURGERIES      Social History   Tobacco Use  . Smoking status: Current Every Day Smoker    Packs/day: 0.25    Types: Cigarettes  . Smokeless tobacco: Never Used  . Tobacco comment: 1 cigarette twice a week  Substance Use Topics  . Alcohol use: Yes    Alcohol/week: 0.0 standard drinks    Comment: occ  . Drug use: No  Family History  Problem Relation Age of Onset  . Heart disease Unknown   . Diabetes Father 57       type 2  . Thyroid disease Father   . Brain cancer Father   . Cancer Father        tumor of the brain cancer  . Migraines Mother   . Kidney disease Mother   . Colon polyps Mother   . Hyperlipidemia Unknown        great grandparents    Allergies  Allergen Reactions  . Cymbalta [Duloxetine Hcl] Nausea And Vomiting    Medication list has been reviewed and updated.  Current Outpatient Medications on File Prior to Visit  Medication Sig Dispense Refill  . albuterol (PROVENTIL HFA;VENTOLIN HFA) 108 (90 Base) MCG/ACT inhaler Inhale 2  puffs into the lungs every 6 (six) hours as needed for wheezing or shortness of breath. 1 Inhaler 0  . alprazolam (XANAX) 2 MG tablet Take 1 tablet (2 mg total) by mouth at bedtime as needed. for sleep. 30 tablet 1  . clobetasol cream (TEMOVATE) 0.05 % APPLY TOPICALLY DAILY AS NEEDED. ECZEMA 60 g 0  . diclofenac (VOLTAREN) 50 MG EC tablet Take 1 tablet (50 mg total) by mouth 3 (three) times daily. 90 tablet 3  . Erenumab-aooe (AIMOVIG) 140 MG/ML SOAJ Inject 140 mg into the skin every 30 (thirty) days. 1 pen 11  . fluconazole (DIFLUCAN) 150 MG tablet Take 1 tablet (150 mg total) by mouth once a week. 2 tablet 1  . lamoTRIgine (LAMICTAL) 150 MG tablet TAKE 1 TABLET BY MOUTH EVERY DAY 30 tablet 2  . metroNIDAZOLE (FLAGYL) 500 MG tablet Take 1 tablet (500 mg total) by mouth 2 (two) times daily. 14 tablet 0  . Norethindrone Acetate-Ethinyl Estrad-FE (LOESTRIN 24 FE) 1-20 MG-MCG(24) tablet Take 1 tablet by mouth daily. 1 Package 11  . promethazine (PHENERGAN) 25 MG tablet Take 1 tablet (25 mg total) by mouth every 8 (eight) hours as needed for nausea or vomiting. 30 tablet 2  . propranolol (INDERAL) 10 MG tablet Take 1-2 tablets (10-20 mg total) by mouth 3 (three) times daily. 180 tablet 2  . rizatriptan (MAXALT) 10 MG tablet Take 1 tablet (10 mg total) by mouth as needed for migraine. May repeat in 2 hours if needed 10 tablet 0  . Vitamin D, Ergocalciferol, (DRISDOL) 50000 units CAPS capsule Take 1 capsule (50,000 Units total) by mouth every 7 (seven) days. 4 capsule 4  . [DISCONTINUED] naproxen (NAPROSYN) 375 MG tablet Take 1 tablet (375 mg total) by mouth 2 (two) times daily with a meal. 360 tablet 0   No current facility-administered medications on file prior to visit.     Review of Systems:  As per HPI- otherwise negative. No fever or chills No CP or SOB  Physical Examination: Vitals:   06/18/18 1538 06/18/18 1557  BP: 124/68   Pulse: (!) 101 96  Resp: 18   Temp: 97.9 F (36.6 C)    SpO2: 98%    Vitals:   06/18/18 1538  Weight: 192 lb (87.1 kg)  Height: 5\' 5"  (1.651 m)   Body mass index is 31.95 kg/m. Ideal Body Weight: Weight in (lb) to have BMI = 25: 149.9  GEN: WDWN, NAD, Non-toxic, A & O x 3,  HEENT: Atraumatic, Normocephalic. Neck supple. No masses, No LAD. Ears and Nose: No external deformity. CV: RRR, No M/G/R. No JVD. No thrill. No extra heart sounds. PULM: CTA B, no wheezes, crackles,  rhonchi. No retractions. No resp. distress. No accessory muscle use.Marland Kitchen EXTR: No c/c/e NEURO Normal gait.  PSYCH: Normally interactive. Conversant. Not depressed or anxious appearing.  Calm demeanor.  Looks well, overweight Right buttock displays a small abscess/large pimple which has already drained.  Gentle pressure produces a tiny amount of watery pus There is no fluctuance or redness around the abscess. I do not think it needs I and D at this time   Assessment and Plan: Abscess of buttock, right - Plan: doxycycline (VIBRA-TABS) 100 MG tablet  Recurrent abscess which has drained spontaneously.  Does not require I and D today Will continue home care measures and treat with doxycycline which she has taken in the past without any sign of allergic reaction She will follow-up if not continuing to get better  See patient instructions for more details.     Signed Abbe Amsterdam, MD

## 2018-06-23 ENCOUNTER — Telehealth: Payer: Self-pay

## 2018-06-23 ENCOUNTER — Encounter: Payer: Self-pay | Admitting: Family Medicine

## 2018-06-23 ENCOUNTER — Ambulatory Visit: Payer: Federal, State, Local not specified - PPO | Admitting: Family Medicine

## 2018-06-23 NOTE — Telephone Encounter (Signed)
Copied from CRM [id number]. Topic: General - Other >> Jun 23, 2018 11:30 AM Marylen Ponto wrote: Reason for CRM: Pt stated her husband had a virtual visit with Dr. Carmelia Roller on Friday 06/20/18 and he was told that he most likely has the corona virus. Pt stated she does not have a fever however she has a cough and needs a note for work stating that she should self quarantine due to her husband been diagnosed with covid-19. Cb# [phone]

## 2018-06-24 ENCOUNTER — Other Ambulatory Visit: Payer: Self-pay

## 2018-06-24 ENCOUNTER — Ambulatory Visit (INDEPENDENT_AMBULATORY_CARE_PROVIDER_SITE_OTHER): Payer: Federal, State, Local not specified - PPO | Admitting: Family Medicine

## 2018-06-24 ENCOUNTER — Encounter: Payer: Self-pay | Admitting: Family Medicine

## 2018-06-24 DIAGNOSIS — L0231 Cutaneous abscess of buttock: Secondary | ICD-10-CM

## 2018-06-24 DIAGNOSIS — J40 Bronchitis, not specified as acute or chronic: Secondary | ICD-10-CM | POA: Diagnosis not present

## 2018-06-24 DIAGNOSIS — G43809 Other migraine, not intractable, without status migrainosus: Secondary | ICD-10-CM

## 2018-06-24 DIAGNOSIS — Z720 Tobacco use: Secondary | ICD-10-CM | POA: Diagnosis not present

## 2018-06-24 DIAGNOSIS — G43909 Migraine, unspecified, not intractable, without status migrainosus: Secondary | ICD-10-CM | POA: Insufficient documentation

## 2018-06-24 MED ORDER — HYDROCODONE-HOMATROPINE 5-1.5 MG/5ML PO SYRP: 5.0000 mL | ORAL_SOLUTION | Freq: Two times a day (BID) | ORAL | 0 refills | Status: DC | PRN

## 2018-06-24 MED ORDER — DOXYCYCLINE HYCLATE 100 MG PO TABS: 100.0000 mg | ORAL_TABLET | Freq: Two times a day (BID) | ORAL | 0 refills | Status: DC

## 2018-06-24 NOTE — Assessment & Plan Note (Signed)
Cough, fatigue, myalgias all started abruptly yesterday but he rhusband has been sick for past week with covid type symptoms including bad cough, maliase and myalgias and is considered presumptively positive. She is kept out of work for 14 days from onset of her symptoms and then follow up prior to returning to work. She struggles with bronchitis at baseline so is given a 5 day course to extend her Doxycycline treatement to a total of 10 days and some hydromet to use qhs prn cough to get her some rest

## 2018-06-24 NOTE — Assessment & Plan Note (Signed)
Encouraged increased hydration, 64 ounces of clear fluids daily. Minimize alcohol and caffeine. Eat small frequent meals with lean proteins and complex carbs. Avoid high and low blood sugars. Get adequate sleep, 7-8 hours a night. Needs exercise daily preferably in the morning. Use meds prn

## 2018-06-24 NOTE — Telephone Encounter (Signed)
Patient scheduled a virtual visit

## 2018-06-24 NOTE — Assessment & Plan Note (Signed)
No smoking.

## 2018-06-24 NOTE — Progress Notes (Signed)
Virtual Visit via Telephone Note  I connected with [name] on 06/24/18 at  9:20 AM EDT by telephone and verified that I am speaking with the correct person using two identifiers. CMA able to get the patient on phone after being unsuccessful at getting patient on video visit.       History of Present Illness: Patient spoken with over the phone. Cough, fatigue, myalgias all started abruptly yesterday but he rhusband has been sick for past week with covid type symptoms including bad cough, maliase and myalgias and is considered presumptively positive. Denies CP/palp/SOB/rs/GI or GU c/o. Taking meds as prescribe    Observations/Objective:   Assessment and Plan: Bronchitis Cough, fatigue, myalgias all started abruptly yesterday but he rhusband has been sick for past week with covid type symptoms including bad cough, maliase and myalgias and is considered presumptively positive. She is kept out of work for 14 days from onset of her symptoms and then follow up prior to returning to work. She struggles with bronchitis at baseline so is given a 5 day course to extend her Doxycycline treatement to a total of 10 days and some hydromet to use qhs prn cough to get her some rest  Tobacco abuse No smoking   Migraine Encouraged increased hydration, 64 ounces of clear fluids daily. Minimize alcohol and caffeine. Eat small frequent meals with lean proteins and complex carbs. Avoid high and low blood sugars. Get adequate sleep, 7-8 hours a night. Needs exercise daily preferably in the morning. Use meds prn   Follow Up Instructions:   .  I discussed the assessment and treatment plan with the patient. The patient was provided an opportunity to ask questions and all were answered. The patient agreed with the plan and demonstrated an understanding of the instructions.   The patient was advised to call back or seek an in-person evaluation if the symptoms worsen or if the condition fails to improve as  anticipated.  I provided 15 minutes of non-face-to-face time during this encounter.   Danise Edge, MD

## 2018-07-07 ENCOUNTER — Telehealth: Payer: Self-pay

## 2018-07-07 ENCOUNTER — Other Ambulatory Visit: Payer: Self-pay

## 2018-07-07 ENCOUNTER — Ambulatory Visit (INDEPENDENT_AMBULATORY_CARE_PROVIDER_SITE_OTHER): Payer: Federal, State, Local not specified - PPO | Admitting: Family Medicine

## 2018-07-07 ENCOUNTER — Encounter: Payer: Self-pay | Admitting: Family Medicine

## 2018-07-07 DIAGNOSIS — Z20828 Contact with and (suspected) exposure to other viral communicable diseases: Secondary | ICD-10-CM | POA: Diagnosis not present

## 2018-07-07 DIAGNOSIS — E785 Hyperlipidemia, unspecified: Secondary | ICD-10-CM | POA: Diagnosis not present

## 2018-07-07 DIAGNOSIS — F319 Bipolar disorder, unspecified: Secondary | ICD-10-CM | POA: Diagnosis not present

## 2018-07-07 DIAGNOSIS — G4700 Insomnia, unspecified: Secondary | ICD-10-CM | POA: Diagnosis not present

## 2018-07-07 DIAGNOSIS — G43009 Migraine without aura, not intractable, without status migrainosus: Secondary | ICD-10-CM

## 2018-07-07 DIAGNOSIS — Z20822 Contact with and (suspected) exposure to covid-19: Secondary | ICD-10-CM | POA: Insufficient documentation

## 2018-07-07 DIAGNOSIS — E559 Vitamin D deficiency, unspecified: Secondary | ICD-10-CM

## 2018-07-07 MED ORDER — ALPRAZOLAM 2 MG PO TABS: 2.0000 mg | ORAL_TABLET | Freq: Every evening | ORAL | 5 refills | Status: DC | PRN

## 2018-07-07 MED ORDER — LAMOTRIGINE 150 MG PO TABS: 150.0000 mg | ORAL_TABLET | Freq: Every day | ORAL | 5 refills | Status: DC

## 2018-07-07 NOTE — Assessment & Plan Note (Signed)
Alprazolam 2 mg qhs has been helpful. Encouraged good sleep hygiene such as dark, quiet room. No blue/green glowing lights such as computer screens in bedroom. No alcohol or stimulants in evening. Cut down on caffeine as able. Regular exercise is helpful but not just prior to bed time.

## 2018-07-07 NOTE — Telephone Encounter (Signed)
Copied from CRM [id number]. Topic: General - Other >> Jul 07, 2018 11:03 AM Wyonia Hough E wrote: Reason for CRM: Pt needed note faxed to employer about being held out of work for another week but need the note to be related to [place] virus for work purposes/ please advise

## 2018-07-07 NOTE — Assessment & Plan Note (Signed)
Is given a refill on Lamictal today

## 2018-07-07 NOTE — Assessment & Plan Note (Signed)
Having increased headaches with recent illness. Encouraged increased hydration, 64 ounces of clear fluids daily. Minimize alcohol and caffeine. Eat small frequent meals with lean proteins and complex carbs. Avoid high and low blood sugars. Get adequate sleep, 7-8 hours a night. Needs exercise daily preferably in the morning. Continue current meds for now

## 2018-07-07 NOTE — Assessment & Plan Note (Signed)
Did not tolerate the high dose so switched to a daily dose but unclear what the dose is possibly 2000 IU daily

## 2018-07-07 NOTE — Progress Notes (Signed)
Virtual Visit via Video Note  I connected with Jillian Reyes on 07/07/18 at 10:20 AM EDT by a video enabled telemedicine application and verified that I am speaking with the correct person using two identifiers.   I discussed the limitations of evaluation and management by telemedicine and the availability of in person appointments. The patient expressed understanding and agreed to proceed. [name] Montez Morita CMA is able to get the patient set up on the video platform    Subjective:    Patient ID: Jillian Reyes, female    DOB: Mar 01, 1991, 28 y.o.   MRN: [id number]  No chief complaint on file.   HPI Patient is in today for follow up on chronic medical concerns including Vitamin D deficiency, insomnia, bipolar disorder, migraines and more. She has been struggling with an increased cough, headaches and fatigue this week. Her husband was diagnosed with presumptive pneumonia. He is doing better but she started with bad diarrhea which caused her hemorrhoids to flare about 2 days ago. Denies CP/palp/SOB/fevers or GU c/o. Taking meds as prescribed  Past Medical History:  Diagnosis Date  . Abdominal pain 10/12/2016  . Abnormal cells of cervix   . Acne 01/06/2012  . Allergic state   . Back pain, lumbosacral   . Bilateral neck pain 03/16/2016  . Bipolar 1 disorder (HCC)   . Chicken pox as a child  . Contraceptive management 01/06/2012  . Contusion of leg 10/15/2012   right  . Diabetes mellitus    gestational   . Dyslipidemia 02/18/2013  . Eczema 01/06/2012  . Eczematous dermatitis of eyelid 09/09/2014  . Encounter for preconception consultation 07/27/2013  . Folliculitis 04/10/2016  . Left ankle sprain 04/10/2016  . Mid back pain   . Right knee pain 12/21/2016  . Vitamin D deficiency 04/10/2015    Past Surgical History:  Procedure Laterality Date  . NO PAST SURGERIES      Family History  Problem Relation Age of Onset  . Heart disease Unknown   . Diabetes Father 39       type 2  .  Thyroid disease Father   . Brain cancer Father   . Cancer Father        tumor of the brain cancer  . Migraines Mother   . Kidney disease Mother   . Colon polyps Mother   . Hyperlipidemia Unknown        great grandparents    Social History   Socioeconomic History  . Marital status: Single    Spouse name: Not on file  . Number of children: 1  . Years of education: Not on file  . Highest education level: Not on file  Occupational History  . Occupation: clerck     Comment: [employer]  Social Needs  . Financial resource strain: Not on file  . Food insecurity:    Worry: Not on file    Inability: Not on file  . Transportation needs:    Medical: Not on file    Non-medical: Not on file  Tobacco Use  . Smoking status: Current Every Day Smoker    Packs/day: 0.25    Types: Cigarettes  . Smokeless tobacco: Never Used  . Tobacco comment: 1 cigarette twice a week  Substance and Sexual Activity  . Alcohol use: Yes    Alcohol/week: 0.0 standard drinks    Comment: occ  . Drug use: No  . Sexual activity: Yes    Partners: Male    Birth control/protection: Condom  Comment: depo shot, lives with son, works for [employer], no dietary restrictions  Lifestyle  . Physical activity:    Days per week: Not on file    Minutes per session: Not on file  . Stress: Not on file  Relationships  . Social connections:    Talks on phone: Not on file    Gets together: Not on file    Attends religious service: Not on file    Active member of club or organization: Not on file    Attends meetings of clubs or organizations: Not on file    Relationship status: Not on file  . Intimate partner violence:    Fear of current or ex partner: Not on file    Emotionally abused: Not on file    Physically abused: Not on file    Forced sexual activity: Not on file  Other Topics Concern  . Not on file  Social History Narrative   Lives home with husband,  4 children.  Works for [employer]. Education college.     Outpatient Medications Prior to Visit  Medication Sig Dispense Refill  . albuterol (PROVENTIL HFA;VENTOLIN HFA) 108 (90 Base) MCG/ACT inhaler Inhale 2 puffs into the lungs every 6 (six) hours as needed for wheezing or shortness of breath. 1 Inhaler 0  . clobetasol cream (TEMOVATE) 0.05 % APPLY TOPICALLY DAILY AS NEEDED. ECZEMA 60 g 0  . diclofenac (VOLTAREN) 50 MG EC tablet Take 1 tablet (50 mg total) by mouth 3 (three) times daily. 90 tablet 3  . doxycycline (VIBRA-TABS) 100 MG tablet Take 1 tablet (100 mg total) by mouth 2 (two) times daily. 10 tablet 0  . Erenumab-aooe (AIMOVIG) 140 MG/ML SOAJ Inject 140 mg into the skin every 30 (thirty) days. 1 pen 11  . fluconazole (DIFLUCAN) 150 MG tablet Take 1 tablet (150 mg total) by mouth once a week. 2 tablet 1  . HYDROcodone-homatropine (HYCODAN) 5-1.5 MG/5ML syrup Take 5 mLs by mouth 2 (two) times daily as needed for cough. 120 mL 0  . metroNIDAZOLE (FLAGYL) 500 MG tablet Take 1 tablet (500 mg total) by mouth 2 (two) times daily. 14 tablet 0  . Norethindrone Acetate-Ethinyl Estrad-FE (LOESTRIN 24 FE) 1-20 MG-MCG(24) tablet Take 1 tablet by mouth daily. 1 Package 11  . promethazine (PHENERGAN) 25 MG tablet Take 1 tablet (25 mg total) by mouth every 8 (eight) hours as needed for nausea or vomiting. 30 tablet 2  . propranolol (INDERAL) 10 MG tablet Take 1-2 tablets (10-20 mg total) by mouth 3 (three) times daily. 180 tablet 2  . rizatriptan (MAXALT) 10 MG tablet Take 1 tablet (10 mg total) by mouth as needed for migraine. May repeat in 2 hours if needed 10 tablet 0  . alprazolam (XANAX) 2 MG tablet Take 1 tablet (2 mg total) by mouth at bedtime as needed. for sleep. 30 tablet 1  . lamoTRIgine (LAMICTAL) 150 MG tablet TAKE 1 TABLET BY MOUTH EVERY DAY 30 tablet 2  . Vitamin D, Ergocalciferol, (DRISDOL) 50000 units CAPS capsule Take 1 capsule (50,000 Units total) by mouth every 7 (seven) days. 4 capsule 4   No facility-administered medications prior to  visit.     Allergies  Allergen Reactions  . Cymbalta [Duloxetine Hcl] Nausea And Vomiting    Review of Systems  Constitutional: Negative for chills, fever and malaise/fatigue.  HENT: Positive for congestion.   Eyes: Negative for blurred vision.  Respiratory: Positive for cough. Negative for shortness of breath.   Cardiovascular: Negative  for chest pain, palpitations and leg swelling.  Gastrointestinal: Positive for diarrhea. Negative for abdominal pain, blood in stool, constipation and nausea.  Genitourinary: Negative for dysuria and frequency.  Musculoskeletal: Negative for falls.  Skin: Negative for rash.  Neurological: Positive for headaches. Negative for dizziness and loss of consciousness.  Endo/Heme/Allergies: Negative for environmental allergies.  Psychiatric/Behavioral: Negative for depression. The patient is not nervous/anxious.        Objective:    Physical Exam Constitutional:      Appearance: Normal appearance. She is not ill-appearing.  HENT:     Head: Normocephalic and atraumatic.     Nose: Nose normal.  Pulmonary:     Effort: Pulmonary effort is normal.     Breath sounds: Normal breath sounds.  Neurological:     Mental Status: She is alert and oriented to person, place, and time.  Psychiatric:        Mood and Affect: Mood normal.        Behavior: Behavior normal.     Ht 5\' 5"  (1.651 m)   Wt 195 lb (88.5 kg)   BMI 32.45 kg/m  Wt Readings from Last 3 Encounters:  07/07/18 195 lb (88.5 kg)  06/18/18 192 lb (87.1 kg)  04/25/18 191 lb (86.6 kg)    Diabetic Foot Exam - Simple   No data filed     Lab Results  Component Value Date   WBC 9.2 01/14/2018   HGB 14.6 01/14/2018   HCT 42.7 01/14/2018   PLT 243.0 01/14/2018   GLUCOSE 96 01/14/2018   CHOL 134 01/14/2018   TRIG 186.0 (H) 01/14/2018   HDL 36.30 (L) 01/14/2018   LDLCALC 60 01/14/2018   ALT 12 01/14/2018   AST 12 01/14/2018   NA 140 01/14/2018   K 4.0 01/14/2018   CL 106 01/14/2018    CREATININE 0.92 01/14/2018   BUN 13 01/14/2018   CO2 26 01/14/2018   TSH 1.44 01/14/2018   MICROALBUR 0.8 03/31/2015    Lab Results  Component Value Date   TSH 1.44 01/14/2018   Lab Results  Component Value Date   WBC 9.2 01/14/2018   HGB 14.6 01/14/2018   HCT 42.7 01/14/2018   MCV 92.3 01/14/2018   PLT 243.0 01/14/2018   Lab Results  Component Value Date   NA 140 01/14/2018   K 4.0 01/14/2018   CO2 26 01/14/2018   GLUCOSE 96 01/14/2018   BUN 13 01/14/2018   CREATININE 0.92 01/14/2018   BILITOT 0.3 01/14/2018   ALKPHOS 61 01/14/2018   AST 12 01/14/2018   ALT 12 01/14/2018   PROT 6.6 01/14/2018   ALBUMIN 4.3 01/14/2018   CALCIUM 9.3 01/14/2018   ANIONGAP 2 (L) 05/12/2014   GFR 77.12 01/14/2018   Lab Results  Component Value Date   CHOL 134 01/14/2018   Lab Results  Component Value Date   HDL 36.30 (L) 01/14/2018   Lab Results  Component Value Date   LDLCALC 60 01/14/2018   Lab Results  Component Value Date   TRIG 186.0 (H) 01/14/2018   Lab Results  Component Value Date   CHOLHDL 4 01/14/2018   No results found for: HGBA1C     Assessment & Plan:   Problem List Items Addressed This Visit    Bipolar 1 disorder (HCC)    Is given a refill on Lamictal today      Dyslipidemia   Relevant Medications   alprazolam (XANAX) 2 MG tablet   Insomnia    Alprazolam  2 mg qhs has been helpful. Encouraged good sleep hygiene such as dark, quiet room. No blue/green glowing lights such as computer screens in bedroom. No alcohol or stimulants in evening. Cut down on caffeine as able. Regular exercise is helpful but not just prior to bed time.       Migraine without aura and without status migrainosus, not intractable    Having increased headaches with recent illness. Encouraged increased hydration, 64 ounces of clear fluids daily. Minimize alcohol and caffeine. Eat small frequent meals with lean proteins and complex carbs. Avoid high and low blood sugars. Get  adequate sleep, 7-8 hours a night. Needs exercise daily preferably in the morning. Continue current meds for now      Relevant Medications   lamoTRIgine (LAMICTAL) 150 MG tablet   Vitamin D deficiency    Did not tolerate the high dose so switched to a daily dose but unclear what the dose is possibly 2000 IU daily      Exposure to Covid-19 Virus    Her husband was seen in ER and diagnosed with presumptive Covid pneumonia he is feeling better but she has developed a cough she is not sure is not allergy directed but she also developed bad diarrhea 2 days ago. Better some today but she is told to presume she has Covid so she is out of work at [employer] for the next week. Will see her virtually in a week to release to work. Note is given. She will call or seek care if she worsens.         I have discontinued Beza A. Zenner's Vitamin D (Ergocalciferol). I have also changed her lamoTRIgine. Additionally, I am having her maintain her promethazine, propranolol, Erenumab-aooe, diclofenac, Norethindrone Acetate-Ethinyl Estrad-FE, fluconazole, clobetasol cream, albuterol, rizatriptan, metroNIDAZOLE, doxycycline, HYDROcodone-homatropine, and alprazolam.  Meds ordered this encounter  Medications  . alprazolam (XANAX) 2 MG tablet    Sig: Take 1 tablet (2 mg total) by mouth at bedtime as needed. for sleep.    Dispense:  30 tablet    Refill:  5    Not to exceed 5 additional fills before 08/24/2018.  Marland Kitchen lamoTRIgine (LAMICTAL) 150 MG tablet    Sig: Take 1 tablet (150 mg total) by mouth daily.    Dispense:  30 tablet    Refill:  5    I discussed the assessment and treatment plan with the patient. The patient was provided an opportunity to ask questions and all were answered. The patient agreed with the plan and demonstrated an understanding of the instructions.   The patient was advised to call back or seek an in-person evaluation if the symptoms worsen or if the condition fails to improve as  anticipated.  I provided 25 minutes of non-face-to-face time during this encounter.   Danise Edge, MD

## 2018-07-07 NOTE — Telephone Encounter (Signed)
OK to write new note for Covid

## 2018-07-07 NOTE — Assessment & Plan Note (Signed)
Her husband was seen in ER and diagnosed with presumptive Covid pneumonia he is feeling better but she has developed a cough she is not sure is not allergy directed but she also developed bad diarrhea 2 days ago. Better some today but she is told to presume she has Covid so she is out of work at [employer] for the next week. Will see her virtually in a week to release to work. Note is given. She will call or seek care if she worsens.

## 2018-07-07 NOTE — Telephone Encounter (Signed)
Copied from CRM [id number]. Topic: General - Inquiry >> Jul 07, 2018 10:59 AM Terisa Starr wrote: Reason for CRM: patient state she needs the note that was uploaded to [facility] faxed to her employer as well. They are requesting this. The fax number is [phone] attention: Karie Soda

## 2018-07-07 NOTE — Telephone Encounter (Signed)
Done

## 2018-07-07 NOTE — Telephone Encounter (Signed)
Please advise 

## 2018-07-08 ENCOUNTER — Other Ambulatory Visit: Payer: Self-pay | Admitting: Family Medicine

## 2018-07-08 MED ORDER — HYDROCORTISONE ACETATE 25 MG RE SUPP: 25.0000 mg | Freq: Every evening | RECTAL | 1 refills | Status: DC | PRN

## 2018-07-14 ENCOUNTER — Telehealth: Payer: Self-pay | Admitting: Family Medicine

## 2018-07-14 ENCOUNTER — Other Ambulatory Visit: Payer: Self-pay

## 2018-07-14 ENCOUNTER — Ambulatory Visit (INDEPENDENT_AMBULATORY_CARE_PROVIDER_SITE_OTHER): Payer: Federal, State, Local not specified - PPO | Admitting: Family Medicine

## 2018-07-14 DIAGNOSIS — G43809 Other migraine, not intractable, without status migrainosus: Secondary | ICD-10-CM

## 2018-07-14 DIAGNOSIS — Z20822 Contact with and (suspected) exposure to covid-19: Secondary | ICD-10-CM

## 2018-07-14 DIAGNOSIS — R000 Tachycardia, unspecified: Secondary | ICD-10-CM | POA: Diagnosis not present

## 2018-07-14 DIAGNOSIS — Z20828 Contact with and (suspected) exposure to other viral communicable diseases: Secondary | ICD-10-CM

## 2018-07-14 DIAGNOSIS — Z720 Tobacco use: Secondary | ICD-10-CM

## 2018-07-14 MED ORDER — PROPRANOLOL HCL 20 MG PO TABS: 20.0000 mg | ORAL_TABLET | Freq: Three times a day (TID) | ORAL | 2 refills | Status: DC

## 2018-07-14 NOTE — Telephone Encounter (Signed)
Patient stated she needs a letter of release to return to work. Please advise.

## 2018-07-14 NOTE — Telephone Encounter (Signed)
Patient is calling in stating she is in need of the letter to go back to work tomorrow and is requesting it be sent through my chart. [phone]

## 2018-07-14 NOTE — Telephone Encounter (Signed)
Letter sent to patient via mychart.  

## 2018-07-14 NOTE — Assessment & Plan Note (Signed)
Still running high, will increase the Propranolol 40 mg tid

## 2018-07-14 NOTE — Assessment & Plan Note (Signed)
Needs to maintain complete cessation

## 2018-07-14 NOTE — Assessment & Plan Note (Signed)
Is following with neurology, [facility] is working with her to try and get her on Aimovig but she cannot afford the copay she is encouraged to call the company to see if she can get assistance with the copay. Encouraged increased hydration, 64 ounces of clear fluids daily. Minimize alcohol and caffeine. Eat small frequent meals with lean proteins and complex carbs. Avoid high and low blood sugars. Get adequate sleep, 7-8 hours a night. Needs exercise daily preferably in the morning. She gets some relief with Propranolol 20 mg tid and still has some tachycardia so she is increased to 40 mg po tid to se if that helps

## 2018-07-14 NOTE — Progress Notes (Signed)
Virtual Visit via Video Note  I connected with Jillian Reyes on 07/14/18 at 10:20 AM EDT by a video enabled telemedicine application and verified that I am speaking with the correct person using two identifiers.   I discussed the limitations of evaluation and management by telemedicine and the availability of in person appointments. The patient expressed understanding and agreed to proceed. [name] Montez Morita CMA was able to get the patient set up on the video platform    Subjective:    Patient ID: Jillian Reyes, female    DOB: [birth year], 28 y.o.   MRN: [id number]  No chief complaint on file.   HPI Patient is in today for follow up on bronchitis and migraines. Her respiratory symptoms have gotten a great deal better but she is still struggling with fatigue and the stress of her husband being diagnosed with Covid. Her daily headaches continue to be a drag. She is following with [facility] and they have prescribed Aimovig but she cannot afford the copay so she has not tried it yet. She feels the Propranolol has been some what helpful but is continuing to have tachycardia. Denies CP/SOB/congestion/fevers/GI or GU c/o. Taking meds as prescribed  Past Medical History:  Diagnosis Date  . Abdominal pain 10/12/2016  . Abnormal cells of cervix   . Acne 01/06/2012  . Allergic state   . Back pain, lumbosacral   . Bilateral neck pain 03/16/2016  . Bipolar 1 disorder (HCC)   . Chicken pox as a child  . Contraceptive management 01/06/2012  . Contusion of leg 10/15/2012   right  . Diabetes mellitus    gestational   . Dyslipidemia 02/18/2013  . Eczema 01/06/2012  . Eczematous dermatitis of eyelid 09/09/2014  . Encounter for preconception consultation 07/27/2013  . Folliculitis 04/10/2016  . Left ankle sprain 04/10/2016  . Mid back pain   . Right knee pain 12/21/2016  . Vitamin D deficiency 04/10/2015    Past Surgical History:  Procedure Laterality Date  . NO PAST SURGERIES      Family History   Problem Relation Age of Onset  . Heart disease Unknown   . Diabetes Father 63       type 2  . Thyroid disease Father   . Brain cancer Father   . Cancer Father        tumor of the brain cancer  . Migraines Mother   . Kidney disease Mother   . Colon polyps Mother   . Hyperlipidemia Unknown        great grandparents    Social History   Socioeconomic History  . Marital status: Single    Spouse name: Not on file  . Number of children: 1  . Years of education: Not on file  . Highest education level: Not on file  Occupational History  . Occupation: clerck     Comment: [employer]  Social Needs  . Financial resource strain: Not on file  . Food insecurity:    Worry: Not on file    Inability: Not on file  . Transportation needs:    Medical: Not on file    Non-medical: Not on file  Tobacco Use  . Smoking status: Current Every Day Smoker    Packs/day: 0.25    Types: Cigarettes  . Smokeless tobacco: Never Used  . Tobacco comment: 1 cigarette twice a week  Substance and Sexual Activity  . Alcohol use: Yes    Alcohol/week: 0.0 standard drinks    Comment: occ  .  Drug use: No  . Sexual activity: Yes    Partners: Male    Birth control/protection: Condom    Comment: depo shot, lives with son, works for [employer], no dietary restrictions  Lifestyle  . Physical activity:    Days per week: Not on file    Minutes per session: Not on file  . Stress: Not on file  Relationships  . Social connections:    Talks on phone: Not on file    Gets together: Not on file    Attends religious service: Not on file    Active member of club or organization: Not on file    Attends meetings of clubs or organizations: Not on file    Relationship status: Not on file  . Intimate partner violence:    Fear of current or ex partner: Not on file    Emotionally abused: Not on file    Physically abused: Not on file    Forced sexual activity: Not on file  Other Topics Concern  . Not on file  Social  History Narrative   Lives home with husband,  4 children.  Works for [employer]. Education college.    Outpatient Medications Prior to Visit  Medication Sig Dispense Refill  . albuterol (PROVENTIL HFA;VENTOLIN HFA) 108 (90 Base) MCG/ACT inhaler Inhale 2 puffs into the lungs every 6 (six) hours as needed for wheezing or shortness of breath. 1 Inhaler 0  . alprazolam (XANAX) 2 MG tablet Take 1 tablet (2 mg total) by mouth at bedtime as needed. for sleep. 30 tablet 5  . clobetasol cream (TEMOVATE) 0.05 % APPLY TOPICALLY DAILY AS NEEDED. ECZEMA 60 g 0  . diclofenac (VOLTAREN) 50 MG EC tablet Take 1 tablet (50 mg total) by mouth 3 (three) times daily. 90 tablet 3  . Erenumab-aooe (AIMOVIG) 140 MG/ML SOAJ Inject 140 mg into the skin every 30 (thirty) days. 1 pen 11  . hydrocortisone (ANUSOL-HC) 25 MG suppository Place 1 suppository (25 mg total) rectally at bedtime as needed for hemorrhoids or anal itching. 12 suppository 1  . lamoTRIgine (LAMICTAL) 150 MG tablet Take 1 tablet (150 mg total) by mouth daily. 30 tablet 5  . Norethindrone Acetate-Ethinyl Estrad-FE (LOESTRIN 24 FE) 1-20 MG-MCG(24) tablet Take 1 tablet by mouth daily. 1 Package 11  . promethazine (PHENERGAN) 25 MG tablet Take 1 tablet (25 mg total) by mouth every 8 (eight) hours as needed for nausea or vomiting. 30 tablet 2  . rizatriptan (MAXALT) 10 MG tablet Take 1 tablet (10 mg total) by mouth as needed for migraine. May repeat in 2 hours if needed 10 tablet 0  . doxycycline (VIBRA-TABS) 100 MG tablet Take 1 tablet (100 mg total) by mouth 2 (two) times daily. 10 tablet 0  . fluconazole (DIFLUCAN) 150 MG tablet Take 1 tablet (150 mg total) by mouth once a week. 2 tablet 1  . HYDROcodone-homatropine (HYCODAN) 5-1.5 MG/5ML syrup Take 5 mLs by mouth 2 (two) times daily as needed for cough. 120 mL 0  . metroNIDAZOLE (FLAGYL) 500 MG tablet Take 1 tablet (500 mg total) by mouth 2 (two) times daily. 14 tablet 0  . propranolol (INDERAL) 10 MG tablet  Take 1-2 tablets (10-20 mg total) by mouth 3 (three) times daily. 180 tablet 2   No facility-administered medications prior to visit.     Allergies  Allergen Reactions  . Cymbalta [Duloxetine Hcl] Nausea And Vomiting    Review of Systems  Constitutional: Positive for malaise/fatigue. Negative for fever.  HENT: Negative for congestion.   Eyes: Negative for blurred vision.  Respiratory: Negative for shortness of breath.   Cardiovascular: Positive for palpitations. Negative for chest pain and leg swelling.  Gastrointestinal: Negative for abdominal pain, blood in stool and nausea.  Genitourinary: Negative for dysuria and frequency.  Musculoskeletal: Negative for falls.  Skin: Negative for rash.  Neurological: Positive for headaches. Negative for dizziness and loss of consciousness.  Endo/Heme/Allergies: Negative for environmental allergies.  Psychiatric/Behavioral: Negative for depression. The patient is not nervous/anxious.        Objective:    Physical Exam Constitutional:      Appearance: Normal appearance. She is not ill-appearing.  HENT:     Head: Normocephalic and atraumatic.  Pulmonary:     Effort: Pulmonary effort is normal.  Neurological:     Mental Status: She is alert and oriented to person, place, and time.  Psychiatric:        Mood and Affect: Mood normal.        Behavior: Behavior normal.     There were no vitals taken for this visit. Wt Readings from Last 3 Encounters:  07/07/18 195 lb (88.5 kg)  06/18/18 192 lb (87.1 kg)  04/25/18 191 lb (86.6 kg)    Diabetic Foot Exam - Simple   No data filed     Lab Results  Component Value Date   WBC 9.2 01/14/2018   HGB 14.6 01/14/2018   HCT 42.7 01/14/2018   PLT 243.0 01/14/2018   GLUCOSE 96 01/14/2018   CHOL 134 01/14/2018   TRIG 186.0 (H) 01/14/2018   HDL 36.30 (L) 01/14/2018   LDLCALC 60 01/14/2018   ALT 12 01/14/2018   AST 12 01/14/2018   NA 140 01/14/2018   K 4.0 01/14/2018   CL 106 01/14/2018    CREATININE 0.92 01/14/2018   BUN 13 01/14/2018   CO2 26 01/14/2018   TSH 1.44 01/14/2018   MICROALBUR 0.8 03/31/2015    Lab Results  Component Value Date   TSH 1.44 01/14/2018   Lab Results  Component Value Date   WBC 9.2 01/14/2018   HGB 14.6 01/14/2018   HCT 42.7 01/14/2018   MCV 92.3 01/14/2018   PLT 243.0 01/14/2018   Lab Results  Component Value Date   NA 140 01/14/2018   K 4.0 01/14/2018   CO2 26 01/14/2018   GLUCOSE 96 01/14/2018   BUN 13 01/14/2018   CREATININE 0.92 01/14/2018   BILITOT 0.3 01/14/2018   ALKPHOS 61 01/14/2018   AST 12 01/14/2018   ALT 12 01/14/2018   PROT 6.6 01/14/2018   ALBUMIN 4.3 01/14/2018   CALCIUM 9.3 01/14/2018   ANIONGAP 2 (L) 05/12/2014   GFR 77.12 01/14/2018   Lab Results  Component Value Date   CHOL 134 01/14/2018   Lab Results  Component Value Date   HDL 36.30 (L) 01/14/2018   Lab Results  Component Value Date   LDLCALC 60 01/14/2018   Lab Results  Component Value Date   TRIG 186.0 (H) 01/14/2018   Lab Results  Component Value Date   CHOLHDL 4 01/14/2018   No results found for: HGBA1C     Assessment & Plan:   Problem List Items Addressed This Visit    Tobacco abuse    Needs to maintain complete cessation      Migraine    Is following with neurology, [facility] is working with her to try and get her on Aimovig but she cannot afford the copay she is encouraged  to call the company to see if she can get assistance with the copay. Encouraged increased hydration, 64 ounces of clear fluids daily. Minimize alcohol and caffeine. Eat small frequent meals with lean proteins and complex carbs. Avoid high and low blood sugars. Get adequate sleep, 7-8 hours a night. Needs exercise daily preferably in the morning. She gets some relief with Propranolol 20 mg tid and still has some tachycardia so she is increased to 40 mg po tid to se if that helps      Relevant Medications   propranolol (INDERAL) 20 MG tablet   Exposure to  Covid-19 Virus    No fevers or worsening cough. Her husband is getting better. She has improved from a respiratory perspective.       Tachycardia    Still running high, will increase the Propranolol 40 mg tid         I have discontinued Annasofia A. Fleagle's propranolol, fluconazole, metroNIDAZOLE, doxycycline, and HYDROcodone-homatropine. I am also having her start on propranolol. Additionally, I am having her maintain her promethazine, Erenumab-aooe, diclofenac, Norethindrone Acetate-Ethinyl Estrad-FE, clobetasol cream, albuterol, rizatriptan, alprazolam, lamoTRIgine, and hydrocortisone.  Meds ordered this encounter  Medications  . propranolol (INDERAL) 20 MG tablet    Sig: Take 1-2 tablets (20-40 mg total) by mouth 3 (three) times daily.    Dispense:  180 tablet    Refill:  2   I discussed the assessment and treatment plan with the patient. The patient was provided an opportunity to ask questions and all were answered. The patient agreed with the plan and demonstrated an understanding of the instructions.   The patient was advised to call back or seek an in-person evaluation if the symptoms worsen or if the condition fails to improve as anticipated.  I provided non-face-to-face time during this encounter.   Danise EdgeStacey Blyth, MD

## 2018-07-14 NOTE — Assessment & Plan Note (Addendum)
No fevers or worsening cough. Her husband is getting better. She has improved from a respiratory perspective.

## 2018-08-12 ENCOUNTER — Encounter: Payer: Self-pay | Admitting: Family Medicine

## 2018-08-12 ENCOUNTER — Ambulatory Visit (INDEPENDENT_AMBULATORY_CARE_PROVIDER_SITE_OTHER): Payer: Federal, State, Local not specified - PPO | Admitting: Family Medicine

## 2018-08-12 ENCOUNTER — Other Ambulatory Visit: Payer: Self-pay

## 2018-08-12 DIAGNOSIS — M545 Low back pain, unspecified: Secondary | ICD-10-CM

## 2018-08-12 MED ORDER — PREDNISONE 20 MG PO TABS: 40.0000 mg | ORAL_TABLET | Freq: Every day | ORAL | 0 refills | Status: AC

## 2018-08-12 MED ORDER — TIZANIDINE HCL 4 MG PO TABS: 4.0000 mg | ORAL_TABLET | Freq: Three times a day (TID) | ORAL | 0 refills | Status: DC | PRN

## 2018-08-12 NOTE — Progress Notes (Signed)
Musculoskeletal Exam  Patient: Jillian Reyes DOB: 1990/06/27  DOS: 08/12/2018  SUBJECTIVE:  Chief Complaint:   Chief Complaint  Patient presents with  . Back Pain    Jillian Reyes is a 28 y.o.  female for evaluation and treatment of her back pain. Due to COVID-19 pandemic, we are interacting via web portal for an electronic face-to-face visit. I verified patient's ID using 2 identifiers. Patient agreed to proceed with visit via this method. Patient is at home, I am at office. Patient and I are present for visit.    Onset:  1 day ago.  Bent down to pick up a bag of clothing and felt a pop in her back on the way up.  Location: lower Character:  aching, sharp and stabbing  Progression of issue:  is unchanged Associated symptoms: pain with twisting and standing up straight Denies bowel/bladder incontinence or weakness Treatment: to date has been rest, ice and heat.   Neurovascular symptoms: no  ROS: Musculoskeletal/Extremities: +back pain Neurologic: no numbness, tingling no weakness   Past Medical History:  Diagnosis Date  . Abdominal pain 10/12/2016  . Abnormal cells of cervix   . Acne 01/06/2012  . Allergic state   . Back pain, lumbosacral   . Bilateral neck pain 03/16/2016  . Bipolar 1 disorder (HCC)   . Chicken pox as a child  . Contraceptive management 01/06/2012  . Contusion of leg 10/15/2012   right  . Diabetes mellitus    gestational   . Dyslipidemia 02/18/2013  . Eczema 01/06/2012  . Eczematous dermatitis of eyelid 09/09/2014  . Encounter for preconception consultation 07/27/2013  . Folliculitis 04/10/2016  . Left ankle sprain 04/10/2016  . Mid back pain   . Right knee pain 12/21/2016  . Vitamin D deficiency 04/10/2015    Objective: No conversational dyspnea Age appropriate judgment and insight Nml affect and mood    Assessment:  Acute bilateral low back pain without sciatica - Plan: predniSONE (DELTASONE) 20 MG tablet, tiZANidine (ZANAFLEX) 4 MG  tablet  Plan: Orders as above. Stretches/exercises, heat, ice, Tylenol, hold on NSAIDs while on above. Has done well with Zanaflex in past.  F/u prn. The patient voiced understanding and agreement to the plan.   Jilda Roche [place], DO 08/12/18  1:02 PM

## 2018-08-14 ENCOUNTER — Telehealth: Payer: Self-pay | Admitting: Family Medicine

## 2018-08-14 NOTE — Telephone Encounter (Signed)
Copied from CRM [id number]. Topic: Quick Communication - Rx Refill/Question >> Aug 14, 2018 12:24 PM Baldo Daub L wrote: Medication:  alprazolam [name]) 2 MG tablet Flagyl  Has the patient contacted their pharmacy? yes (Agent: If no, request that the patient contact the pharmacy for the refill.) (Agent: If yes, when and what did the pharmacy advise?)  Preferred Pharmacy (with phone number or street name): [id number] [place], [place] - [address] [phone] (Phone) [phone] (Fax)  Agent: Please be advised that RX refills may take up to 3 business days. We ask that you follow-up with your pharmacy.

## 2018-08-20 NOTE — Telephone Encounter (Signed)
Medication was sent in on 07/07/18 with 5 refills patient needs to call the pharmacy.  Left voicemail letting patient know

## 2018-09-08 ENCOUNTER — Other Ambulatory Visit: Payer: Self-pay

## 2018-09-08 ENCOUNTER — Ambulatory Visit: Payer: Federal, State, Local not specified - PPO | Admitting: Family Medicine

## 2018-10-07 ENCOUNTER — Telehealth: Payer: Self-pay

## 2018-10-07 NOTE — Telephone Encounter (Signed)
Renewal for Aimovig 140 mg/mL Key: [id number] ICD 10 code: G43.009  I attempted to do a renewal for this medication but it stated that the PA has been resolved.

## 2018-10-28 ENCOUNTER — Encounter: Payer: Self-pay | Admitting: Family Medicine

## 2018-10-29 ENCOUNTER — Other Ambulatory Visit: Payer: Self-pay | Admitting: Family Medicine

## 2018-10-29 DIAGNOSIS — Z20828 Contact with and (suspected) exposure to other viral communicable diseases: Secondary | ICD-10-CM | POA: Diagnosis not present

## 2018-11-13 ENCOUNTER — Encounter: Payer: Self-pay | Admitting: Family Medicine

## 2018-11-17 ENCOUNTER — Other Ambulatory Visit: Payer: Self-pay | Admitting: Family Medicine

## 2018-11-17 DIAGNOSIS — L03115 Cellulitis of right lower limb: Secondary | ICD-10-CM

## 2018-11-17 MED ORDER — METRONIDAZOLE 500 MG PO TABS: 500.0000 mg | ORAL_TABLET | Freq: Two times a day (BID) | ORAL | 0 refills | Status: DC

## 2018-11-17 MED ORDER — ALBUTEROL SULFATE HFA 108 (90 BASE) MCG/ACT IN AERS: 2.0000 | INHALATION_SPRAY | Freq: Four times a day (QID) | RESPIRATORY_TRACT | 2 refills | Status: DC | PRN

## 2018-11-17 MED ORDER — CEPHALEXIN 500 MG PO CAPS: 500.0000 mg | ORAL_CAPSULE | Freq: Four times a day (QID) | ORAL | 0 refills | Status: DC

## 2018-12-02 ENCOUNTER — Encounter: Payer: Self-pay | Admitting: Family Medicine

## 2018-12-02 ENCOUNTER — Ambulatory Visit (INDEPENDENT_AMBULATORY_CARE_PROVIDER_SITE_OTHER): Payer: Federal, State, Local not specified - PPO | Admitting: Family Medicine

## 2018-12-02 ENCOUNTER — Other Ambulatory Visit: Payer: Self-pay

## 2018-12-02 VITALS — Wt 195.0 lb

## 2018-12-02 DIAGNOSIS — L309 Dermatitis, unspecified: Secondary | ICD-10-CM

## 2018-12-02 DIAGNOSIS — L709 Acne, unspecified: Secondary | ICD-10-CM

## 2018-12-02 DIAGNOSIS — N926 Irregular menstruation, unspecified: Secondary | ICD-10-CM | POA: Diagnosis not present

## 2018-12-02 DIAGNOSIS — L509 Urticaria, unspecified: Secondary | ICD-10-CM

## 2018-12-02 DIAGNOSIS — N83209 Unspecified ovarian cyst, unspecified side: Secondary | ICD-10-CM | POA: Diagnosis not present

## 2018-12-02 DIAGNOSIS — N941 Unspecified dyspareunia: Secondary | ICD-10-CM

## 2018-12-02 DIAGNOSIS — F319 Bipolar disorder, unspecified: Secondary | ICD-10-CM

## 2018-12-02 MED ORDER — CLOBETASOL PROPIONATE 0.05 % EX CREA: TOPICAL_CREAM | CUTANEOUS | 3 refills | Status: DC

## 2018-12-03 ENCOUNTER — Telehealth: Payer: Self-pay | Admitting: Family Medicine

## 2018-12-03 DIAGNOSIS — R000 Tachycardia, unspecified: Secondary | ICD-10-CM

## 2018-12-03 DIAGNOSIS — E559 Vitamin D deficiency, unspecified: Secondary | ICD-10-CM

## 2018-12-03 DIAGNOSIS — E785 Hyperlipidemia, unspecified: Secondary | ICD-10-CM

## 2018-12-03 NOTE — Telephone Encounter (Signed)
Return 1-2 week lab appt and nurse visit same day for flu shot and a follow up in 6 mn.  Please place orders for labs and we can call to schedule appointments.

## 2018-12-03 NOTE — Telephone Encounter (Signed)
TSH, CBC and vitamin D order these also for tachycardia and vitamin D deficiency

## 2018-12-03 NOTE — Telephone Encounter (Signed)
I have future ordered cmp and lipid.  Is there anything else you would like to order?

## 2018-12-04 ENCOUNTER — Other Ambulatory Visit: Payer: Self-pay

## 2018-12-04 NOTE — Telephone Encounter (Signed)
Orders in 

## 2018-12-04 NOTE — Telephone Encounter (Signed)
Can you schedule patient for lab appt

## 2018-12-04 NOTE — Addendum Note (Signed)
Addended by: Kem Boroughs D on: 12/04/2018 12:31 PM   Modules accepted: Orders

## 2018-12-05 DIAGNOSIS — N941 Unspecified dyspareunia: Secondary | ICD-10-CM | POA: Insufficient documentation

## 2018-12-05 NOTE — Progress Notes (Signed)
Virtual Visit via Video Note  I connected with Jillian Reyes on 12/02/18 at  3:20 PM EDT by a video enabled telemedicine application and verified that I am speaking with the correct person using two identifiers.  Location: Patient: home Provider: home   I discussed the limitations of evaluation and management by telemedicine and the availability of in person appointments. The patient expressed understanding and agreed to proceed. Crissie Sickles, CMA was able to get the patient set up on virtual visit.     Subjective:    Patient ID: Jillian Reyes, female    DOB: 1990/09/16, 28 y.o.   MRN: [id number]  No chief complaint on file.   HPI Patient is in today for evaluation of numerous concerns. She continues to have intermittent flares in her eczema but it responds to Clobetasol prn. She is mostly concerned about her painful coitus, ovarian cysts and painful irregular menses. She is interested in seeing an OB/GYN for further evaluation. She notes her respiratory symptoms have improved. Denies CP/palp/SOB/HA/congestion/fevers/GI or GU c/o. Taking meds as prescribed  Past Medical History:  Diagnosis Date  . Abdominal pain 10/12/2016  . Abnormal cells of cervix   . Acne 01/06/2012  . Allergic state   . Back pain, lumbosacral   . Bilateral neck pain 03/16/2016  . Bipolar 1 disorder (HCC)   . Chicken pox as a child  . Contraceptive management 01/06/2012  . Contusion of leg 10/15/2012   right  . Diabetes mellitus    gestational   . Dyslipidemia 02/18/2013  . Eczema 01/06/2012  . Eczematous dermatitis of eyelid 09/09/2014  . Encounter for preconception consultation 07/27/2013  . Folliculitis 04/10/2016  . Left ankle sprain 04/10/2016  . Mid back pain   . Right knee pain 12/21/2016  . Vitamin D deficiency 04/10/2015    Past Surgical History:  Procedure Laterality Date  . NO PAST SURGERIES      Family History  Problem Relation Age of Onset  . Heart disease Unknown   . Diabetes  Father 33       type 2  . Thyroid disease Father   . Brain cancer Father   . Cancer Father        tumor of the brain cancer  . Migraines Mother   . Kidney disease Mother   . Colon polyps Mother   . Hyperlipidemia Unknown        great grandparents    Social History   Socioeconomic History  . Marital status: Single    Spouse name: Not on file  . Number of children: 1  . Years of education: Not on file  . Highest education level: Not on file  Occupational History  . Occupation: clerck     Comment: [employer]  Social Needs  . Financial resource strain: Not on file  . Food insecurity    Worry: Not on file    Inability: Not on file  . Transportation needs    Medical: Not on file    Non-medical: Not on file  Tobacco Use  . Smoking status: Current Every Day Smoker    Packs/day: 0.25    Types: Cigarettes  . Smokeless tobacco: Never Used  . Tobacco comment: 1 cigarette twice a week  Substance and Sexual Activity  . Alcohol use: Yes    Alcohol/week: 0.0 standard drinks    Comment: occ  . Drug use: No  . Sexual activity: Yes    Partners: Male    Birth control/protection: Condom  Comment: depo shot, lives with son, works for [employer], no dietary restrictions  Lifestyle  . Physical activity    Days per week: Not on file    Minutes per session: Not on file  . Stress: Not on file  Relationships  . Social Musicianconnections    Talks on phone: Not on file    Gets together: Not on file    Attends religious service: Not on file    Active member of club or organization: Not on file    Attends meetings of clubs or organizations: Not on file    Relationship status: Not on file  . Intimate partner violence    Fear of current or ex partner: Not on file    Emotionally abused: Not on file    Physically abused: Not on file    Forced sexual activity: Not on file  Other Topics Concern  . Not on file  Social History Narrative   Lives home with husband,  4 children.  Works for [employer].  Education college.    Outpatient Medications Prior to Visit  Medication Sig Dispense Refill  . albuterol (VENTOLIN HFA) 108 (90 Base) MCG/ACT inhaler Inhale 2 puffs into the lungs every 6 (six) hours as needed for wheezing or shortness of breath. 18 g 2  . alprazolam (XANAX) 2 MG tablet Take 1 tablet (2 mg total) by mouth at bedtime as needed. for sleep. 30 tablet 5  . diclofenac (VOLTAREN) 50 MG EC tablet Take 1 tablet (50 mg total) by mouth 3 (three) times daily. 90 tablet 3  . Erenumab-aooe (AIMOVIG) 140 MG/ML SOAJ Inject 140 mg into the skin every 30 (thirty) days. 1 pen 11  . hydrocortisone (ANUSOL-HC) 25 MG suppository Place 1 suppository (25 mg total) rectally at bedtime as needed for hemorrhoids or anal itching. 12 suppository 1  . lamoTRIgine (LAMICTAL) 150 MG tablet Take 1 tablet (150 mg total) by mouth daily. 30 tablet 5  . metroNIDAZOLE (FLAGYL) 500 MG tablet Take 1 tablet (500 mg total) by mouth 2 (two) times daily. 14 tablet 0  . Norethindrone Acetate-Ethinyl Estrad-FE (LOESTRIN 24 FE) 1-20 MG-MCG(24) tablet Take 1 tablet by mouth daily. 1 Package 11  . promethazine (PHENERGAN) 25 MG tablet Take 1 tablet (25 mg total) by mouth every 8 (eight) hours as needed for nausea or vomiting. 30 tablet 2  . propranolol (INDERAL) 20 MG tablet TAKE 1-2 TABLETS (20-40 MG TOTAL) BY MOUTH 3 (THREE) TIMES DAILY. 180 tablet 2  . rizatriptan (MAXALT) 10 MG tablet Take 1 tablet (10 mg total) by mouth as needed for migraine. May repeat in 2 hours if needed 10 tablet 0  . tiZANidine (ZANAFLEX) 4 MG tablet Take 1 tablet (4 mg total) by mouth every 8 (eight) hours as needed for muscle spasms. 21 tablet 0  . cephALEXin (KEFLEX) 500 MG capsule Take 1 capsule (500 mg total) by mouth 4 (four) times daily. 40 capsule 0  . clobetasol cream (TEMOVATE) 0.05 % APPLY TOPICALLY DAILY AS NEEDED. ECZEMA 60 g 0   No facility-administered medications prior to visit.     Allergies  Allergen Reactions  . Cymbalta  [Duloxetine Hcl] Nausea And Vomiting    Review of Systems  Constitutional: Positive for malaise/fatigue. Negative for fever.  HENT: Negative for congestion.   Eyes: Negative for blurred vision.  Respiratory: Negative for shortness of breath.   Cardiovascular: Negative for chest pain, palpitations and leg swelling.  Gastrointestinal: Positive for abdominal pain. Negative for blood in stool  and nausea.  Genitourinary: Negative for dysuria and frequency.  Musculoskeletal: Negative for falls.  Skin: Positive for itching and rash.  Neurological: Negative for dizziness, loss of consciousness and headaches.  Endo/Heme/Allergies: Negative for environmental allergies.  Psychiatric/Behavioral: Negative for depression. The patient is not nervous/anxious.        Objective:    Physical Exam Constitutional:      Appearance: Normal appearance. She is not ill-appearing.  HENT:     Head: Normocephalic and atraumatic.     Nose: Nose normal.  Pulmonary:     Effort: Pulmonary effort is normal.  Neurological:     Mental Status: She is alert and oriented to person, place, and time.     Wt 195 lb (88.5 kg)   BMI 32.45 kg/m  Wt Readings from Last 3 Encounters:  12/02/18 195 lb (88.5 kg)  07/07/18 195 lb (88.5 kg)  06/18/18 192 lb (87.1 kg)    Diabetic Foot Exam - Simple   No data filed     Lab Results  Component Value Date   WBC 9.2 01/14/2018   HGB 14.6 01/14/2018   HCT 42.7 01/14/2018   PLT 243.0 01/14/2018   GLUCOSE 96 01/14/2018   CHOL 134 01/14/2018   TRIG 186.0 (H) 01/14/2018   HDL 36.30 (L) 01/14/2018   LDLCALC 60 01/14/2018   ALT 12 01/14/2018   AST 12 01/14/2018   NA 140 01/14/2018   K 4.0 01/14/2018   CL 106 01/14/2018   CREATININE 0.92 01/14/2018   BUN 13 01/14/2018   CO2 26 01/14/2018   TSH 1.44 01/14/2018   MICROALBUR 0.8 03/31/2015    Lab Results  Component Value Date   TSH 1.44 01/14/2018   Lab Results  Component Value Date   WBC 9.2 01/14/2018    HGB 14.6 01/14/2018   HCT 42.7 01/14/2018   MCV 92.3 01/14/2018   PLT 243.0 01/14/2018   Lab Results  Component Value Date   NA 140 01/14/2018   K 4.0 01/14/2018   CO2 26 01/14/2018   GLUCOSE 96 01/14/2018   BUN 13 01/14/2018   CREATININE 0.92 01/14/2018   BILITOT 0.3 01/14/2018   ALKPHOS 61 01/14/2018   AST 12 01/14/2018   ALT 12 01/14/2018   PROT 6.6 01/14/2018   ALBUMIN 4.3 01/14/2018   CALCIUM 9.3 01/14/2018   ANIONGAP 2 (L) 05/12/2014   GFR 77.12 01/14/2018   Lab Results  Component Value Date   CHOL 134 01/14/2018   Lab Results  Component Value Date   HDL 36.30 (L) 01/14/2018   Lab Results  Component Value Date   LDLCALC 60 01/14/2018   Lab Results  Component Value Date   TRIG 186.0 (H) 01/14/2018   Lab Results  Component Value Date   CHOLHDL 4 01/14/2018   No results found for: HGBA1C     Assessment & Plan:   Problem List Items Addressed This Visit    Bipolar 1 disorder (HCC)    Is stable on current meds. No changes      Eczema    Given refill on Clobetasol to use prn      Acne   Relevant Medications   clobetasol cream (TEMOVATE) 0.05 %   Painful coitus, female    With history of ovarian cysts and heavy irregular menses. She has requested referral to new OB/GYN for evaluation and further management.        Other Visit Diagnoses    Cyst of ovary, unspecified laterality    -  Primary   Relevant Orders   Ambulatory referral to Obstetrics / Gynecology   Hives       Relevant Medications   clobetasol cream (TEMOVATE) 0.05 %   Irregular menses       Relevant Orders   Ambulatory referral to Obstetrics / Gynecology      I have discontinued Savannah A. Wah's cephALEXin. I am also having her maintain her promethazine, Erenumab-aooe, diclofenac, Norethindrone Acetate-Ethinyl Estrad-FE, rizatriptan, alprazolam, lamoTRIgine, hydrocortisone, tiZANidine, propranolol, albuterol, metroNIDAZOLE, and clobetasol cream.  Meds ordered this encounter   Medications  . clobetasol cream (TEMOVATE) 0.05 %    Sig: APPLY TOPICALLY DAILY AS NEEDED. ECZEMA    Dispense:  60 g    Refill:  3    I discussed the assessment and treatment plan with the patient. The patient was provided an opportunity to ask questions and all were answered. The patient agreed with the plan and demonstrated an understanding of the instructions.   The patient was advised to call back or seek an in-person evaluation if the symptoms worsen or if the condition fails to improve as anticipated.  I provided 25 minutes of non-face-to-face time during this encounter.   Penni Homans, MD

## 2018-12-05 NOTE — Assessment & Plan Note (Signed)
Is stable on current meds. No changes

## 2018-12-05 NOTE — Assessment & Plan Note (Signed)
Given refill on Clobetasol to use prn 

## 2018-12-05 NOTE — Assessment & Plan Note (Signed)
With history of ovarian cysts and heavy irregular menses. She has requested referral to new OB/GYN for evaluation and further management.

## 2018-12-09 ENCOUNTER — Encounter: Payer: Self-pay | Admitting: Obstetrics & Gynecology

## 2018-12-09 ENCOUNTER — Ambulatory Visit: Payer: Federal, State, Local not specified - PPO | Admitting: Obstetrics & Gynecology

## 2018-12-09 ENCOUNTER — Other Ambulatory Visit (HOSPITAL_COMMUNITY)
Admission: RE | Admit: 2018-12-09 | Discharge: 2018-12-09 | Disposition: A | Discharge status: Home / Self Care | Payer: Federal, State, Local not specified - PPO | Source: Ambulatory Visit | Attending: Obstetrics & Gynecology | Admitting: Obstetrics & Gynecology

## 2018-12-09 ENCOUNTER — Other Ambulatory Visit: Payer: Self-pay

## 2018-12-09 VITALS — BP 86/56 | HR 64 | Temp 98.0°F | Ht 64.5 in | Wt 195.0 lb

## 2018-12-09 DIAGNOSIS — N912 Amenorrhea, unspecified: Secondary | ICD-10-CM | POA: Diagnosis not present

## 2018-12-09 DIAGNOSIS — N83299 Other ovarian cyst, unspecified side: Secondary | ICD-10-CM | POA: Diagnosis not present

## 2018-12-09 DIAGNOSIS — Z124 Encounter for screening for malignant neoplasm of cervix: Secondary | ICD-10-CM

## 2018-12-09 LAB — POCT URINE PREGNANCY: Preg Test, Ur: NEGATIVE

## 2018-12-09 NOTE — Patient Instructions (Signed)
Question to have answered: 1) what type of polyp(s) did your mother have?

## 2018-12-09 NOTE — Progress Notes (Signed)
28 y.o. G45P1011 Married Other or two or more races female here for new patient consult.  She is referred from Dr. Penni Homans for several issues.  Pt reports she feels most of her issues started about five years ago when she stopped Depo Provera that was used for contraception.  Prior to that time, cycles were normal.  Once she stopped the Depo Provera, she would skip cycles from time to time.  It's now been since April since she had any vaginal bleeding.  She feels her hormones are "all messed up" and "even my Lamictal isn't working".  She is on Lamictal for bipolar d/o.    She is also here for follow up from a complex right ovarian cyst that was noted on ultrasound 04/15/2018 at [facility].  The cyst measured d4.8 x 3.8 x 4.4cm.  Follow up ultrasound was recommended.  She has not had this done.  She did see Dr. Loma Boston and Dr. Ihor Dow in consultation who both recommended no surgery.  Pain has persisted since that time.  She is not sure this is related to the cyst but she does have painful intercourse that is intermittent.  Does have vaginal dryness, at times, and that seems to cause pain.    Reports her mother had fibroids and had a hysterectomy in her early 73's.    Patient's last menstrual period was 06/18/2018 (approximate).          Sexually active: Yes.    The current method of family planning is vasectomy.    Exercising: No Smoker:  Yes, vape  Health Maintenance: Pap:  09/24/13 Negative History of abnormal Pap:  yes TDaP:  11/24/2016  Gardisil vaccine:  Completed Screening Labs:    reports that she quit smoking about 7 months ago. Her smoking use included cigarettes. She smoked 0.25 packs per day. She has never used smokeless tobacco. She reports current alcohol use. She reports that she does not use drugs.  Past Medical History:  Diagnosis Date  . Abdominal pain 10/12/2016  . Abnormal cells of cervix   . Acne 01/06/2012  . Allergic state   . Back pain,  lumbosacral   . Bilateral neck pain 03/16/2016  . Bipolar 1 disorder ([place])   . Chicken pox as a child  . Chlamydia 2015  . Contraceptive management 01/06/2012  . Contusion of leg 10/15/2012   right  . Diabetes mellitus    gestational   . Dyslipidemia 02/18/2013  . Eczema 01/06/2012  . Eczematous dermatitis of eyelid 09/09/2014  . Encounter for preconception consultation 07/27/2013  . Folliculitis 2/59/5638  . Irregular periods/menstrual cycles   . Left ankle sprain 04/10/2016  . Mid back pain   . Right knee pain 12/21/2016  . Vitamin D deficiency 04/10/2015    Past Surgical History:  Procedure Laterality Date  . NO PAST SURGERIES      Current Outpatient Medications  Medication Sig Dispense Refill  . albuterol (VENTOLIN HFA) 108 (90 Base) MCG/ACT inhaler Inhale 2 puffs into the lungs every 6 (six) hours as needed for wheezing or shortness of breath. 18 g 2  . alprazolam (XANAX) 2 MG tablet Take 1 tablet (2 mg total) by mouth at bedtime as needed. for sleep. 30 tablet 5  . clobetasol cream (TEMOVATE) 0.05 % APPLY TOPICALLY DAILY AS NEEDED. ECZEMA 60 g 3  . diclofenac (VOLTAREN) 50 MG EC tablet Take 1 tablet (50 mg total) by mouth 3 (three) times daily. 90 tablet 3  .  Erenumab-aooe (AIMOVIG) 140 MG/ML SOAJ Inject 140 mg into the skin every 30 (thirty) days. 1 pen 11  . lamoTRIgine (LAMICTAL) 150 MG tablet Take 1 tablet (150 mg total) by mouth daily. 30 tablet 5  . promethazine (PHENERGAN) 25 MG tablet Take 1 tablet (25 mg total) by mouth every 8 (eight) hours as needed for nausea or vomiting. 30 tablet 2  . propranolol (INDERAL) 20 MG tablet TAKE 1-2 TABLETS (20-40 MG TOTAL) BY MOUTH 3 (THREE) TIMES DAILY. 180 tablet 2  . rizatriptan (MAXALT) 10 MG tablet Take 1 tablet (10 mg total) by mouth as needed for migraine. May repeat in 2 hours if needed 10 tablet 0  . tiZANidine (ZANAFLEX) 4 MG tablet Take 1 tablet (4 mg total) by mouth every 8 (eight) hours as needed for muscle spasms. 21  tablet 0   No current facility-administered medications for this visit.     Family History  Problem Relation Age of Onset  . Heart disease Other   . Diabetes Father 73       type 2  . Thyroid disease Father   . Brain cancer Father   . Cancer Father        tumor of the brain cancer  . Migraines Mother   . Kidney disease Mother   . Colon polyps Mother   . Hyperlipidemia Other        great grandparents    Review of Systems  Genitourinary: Positive for menstrual problem, pelvic pain and vaginal discharge.       Amenorrhea  Irregular periods   All other systems reviewed and are negative.   Exam:   BP (!) 86/56   Pulse 64   Temp 98 F (36.7 C) (Temporal)   Ht 5' 4.5" (1.638 m)   Wt 195 lb (88.5 kg)   LMP 06/18/2018 (Approximate)   BMI 32.95 kg/m     Height: 5' 4.5" (163.8 cm)  Ht Readings from Last 3 Encounters:  12/09/18 5' 4.5" (1.638 m)  07/07/18 5\' 5"  (1.651 m)  06/18/18 5\' 5"  (1.651 m)    General appearance: alert, cooperative and appears stated age Head: Normocephalic, without obvious abnormality, atraumatic Neck: no adenopathy, supple, symmetrical, trachea midline and thyroid normal to inspection and palpation Lungs: clear to auscultation bilaterally Breasts: normal appearance, no masses or tenderness Heart: regular rate and rhythm Abdomen: soft, non-tender; bowel sounds normal; no masses,  no organomegaly Extremities: extremities normal, atraumatic, no cyanosis or edema Skin: Skin color, texture, turgor normal. No rashes or lesions Lymph nodes: Cervical, supraclavicular, and axillary nodes normal. No abnormal inguinal nodes palpated Neurologic: Grossly normal   Pelvic: External genitalia:  no lesions              Urethra:  normal appearing urethra with no masses, tenderness or lesions              Bartholins and Skenes: normal                 Vagina: normal appearing vagina with normal color and discharge, no lesions              Cervix: no lesions               Pap taken: Yes.   Bimanual Exam:  Uterus:  normal size, contour, position, consistency, mobility, non-tender              Adnexa: normal adnexa and no mass, fullness, tenderness  Rectovaginal: Confirms               Anus:  normal sphincter tone, no lesions  Chaperone was present for exam.  A:  Well Woman with normal exam Amenorrhea H/o bipolar d/o RLQ pain H/o complex ovarian cyst  P:   Mammogram not indicated at this age pap smear obtained today Return for PUS or will have it scheduled at outpt facility Prolactin, HCG, TSH and FSH will be obtained today return annually or prn

## 2018-12-10 ENCOUNTER — Telehealth: Payer: Self-pay | Admitting: Obstetrics & Gynecology

## 2018-12-10 LAB — CYTOLOGY - PAP
Adequacy: ABSENT
Diagnosis: NEGATIVE

## 2018-12-10 LAB — FOLLICLE STIMULATING HORMONE: FSH: 7.4 m[IU]/mL

## 2018-12-10 LAB — PROLACTIN: Prolactin: 10.3 ng/mL (ref 4.8–23.3)

## 2018-12-10 LAB — BETA HCG QUANT (REF LAB): hCG Quant: <1 m[IU]/mL

## 2018-12-10 LAB — TSH: TSH: 1.42 u[IU]/mL (ref 0.450–4.500)

## 2018-12-10 NOTE — Telephone Encounter (Signed)
Patient has some questions before coming to her PUS appointment tomorrow? She is asking how she should prepare for the appointment?

## 2018-12-10 NOTE — Telephone Encounter (Signed)
Spoke with patient. Questions answered regarding upcomming PUS. Patient verbalizes understanding .   Encounter closed.

## 2018-12-11 ENCOUNTER — Ambulatory Visit (INDEPENDENT_AMBULATORY_CARE_PROVIDER_SITE_OTHER): Payer: Federal, State, Local not specified - PPO

## 2018-12-11 ENCOUNTER — Ambulatory Visit: Payer: Federal, State, Local not specified - PPO | Admitting: Obstetrics & Gynecology

## 2018-12-11 ENCOUNTER — Other Ambulatory Visit: Payer: Federal, State, Local not specified - PPO

## 2018-12-11 ENCOUNTER — Other Ambulatory Visit: Payer: Self-pay

## 2018-12-11 VITALS — BP 100/66 | HR 76 | Temp 97.9°F | Ht 64.5 in | Wt 196.0 lb

## 2018-12-11 DIAGNOSIS — R102 Pelvic and perineal pain: Secondary | ICD-10-CM

## 2018-12-11 DIAGNOSIS — N912 Amenorrhea, unspecified: Secondary | ICD-10-CM | POA: Diagnosis not present

## 2018-12-11 DIAGNOSIS — G8929 Other chronic pain: Secondary | ICD-10-CM | POA: Diagnosis not present

## 2018-12-11 DIAGNOSIS — E282 Polycystic ovarian syndrome: Secondary | ICD-10-CM

## 2018-12-11 DIAGNOSIS — N83299 Other ovarian cyst, unspecified side: Secondary | ICD-10-CM | POA: Diagnosis not present

## 2018-12-11 MED ORDER — MEDROXYPROGESTERONE ACETATE 10 MG PO TABS: 10.0000 mg | ORAL_TABLET | Freq: Every day | ORAL | 0 refills | Status: DC

## 2018-12-11 MED ORDER — METFORMIN HCL 500 MG PO TABS: ORAL_TABLET | ORAL | 1 refills | Status: DC

## 2018-12-11 NOTE — Progress Notes (Signed)
28 y.o. G24P1011 married caucasian female here for pelvic ultrasound due to pelvic pain, oligomenorrhea and and h/o complex right ovarian cyst noted on PUS 04/15/2018.  Patient's last menstrual period was 06/18/2018 (approximate).  Contraception: vasectomy  Findings:  UTERUS: 7.7 x 4.8 x 3.6cm EMS: 6.31mm ADNEXA: Left ovary: 3.4 x 2.9 x 2.2cm.  Volume: 11.3 cm3       Right ovary: 3.9 x 2.2 x 2.2cm.  Volume: 9.9cm3.  Both ovaries with multiple peripheral cysts <6mm c/w PCOS CUL DE SAC: no free fluid  Discussion:  Findings reviewed with pt.  Importance of having menstrual cycles at least every 90 days discussed.  Will start provera 10mg  x 10 days now to trigger cycle.  Relation to diabetes and lipid abnormalities discussed.  Regular testing recommended.  She spouse has undergone a vasectomy, she does not need contraception.  Recommended fasting insulin level and total testosterone level as well.  She is ok having these done today.  Glucophage and spironolactone discussed.  Will plan to start Glucophage 500mg  q day x 2 weeks and then increase to BID.  Complex right cyst has resolved.  Pt desires to continue with evaluation as she is experiencing chronic pelvic pain with dyspareunia and she would really like to get to the bottom of this.  Diagnostic laparoscopy discussed.  Procedure discussed with patient.  Hospital stay, recovery and pain management all discussed.  Risks discussed including but not limited to bleeding, <1% risk of receiving a  transfusion, infection, 1-2% risk of bowel/bladder/ureteral/vascular injury discussed as well as possible need for additional surgery if injury does occur discussed.  DVT/PE and rare risk of death discussed.  My actual complications with prior surgeries discussed.  Positioning and incision locations discussed.  She would like to proceed with scheduling if possible.  Assessment:  Chronic pelvic pain Oligomenorrhea with normal prolactin, TSH, and FSH levels PCOS  appearing ovaries on ultrasound today Resolution of complex right ovarian cyst  Plan:  Diagnostic laparoscopic will be planned HbA1C, testosterone level and insulin level obtained today Provera 10mg  x 10 days started today Glucophage 500mg  daily x 2 weeks then increase to twice daily.  Rx to pharmacy.  ~30 minutes spent with patient >50% of time was in face to face discussion of above.

## 2018-12-14 ENCOUNTER — Encounter: Payer: Self-pay | Admitting: Obstetrics & Gynecology

## 2018-12-14 LAB — TESTT+TESTF+SHBG
Sex Hormone Binding: 35.1 nmol/L (ref 24.6–122.0)
Testosterone, Free: 3.8 pg/mL (ref 0.0–4.2)
Testosterone, Total, LC/MS: 57.6 ng/dL — ABNORMAL HIGH (ref 10.0–55.0)

## 2018-12-14 LAB — HEMOGLOBIN A1C
Est. average glucose Bld gHb Est-mCnc: 100 mg/dL
Hgb A1c MFr Bld: 5.1 % (ref 4.8–5.6)

## 2018-12-14 LAB — INSULIN, RANDOM: INSULIN: 12.8 u[IU]/mL (ref 2.6–24.9)

## 2018-12-17 ENCOUNTER — Other Ambulatory Visit: Payer: Self-pay

## 2018-12-17 ENCOUNTER — Telehealth: Payer: Self-pay | Admitting: Obstetrics & Gynecology

## 2018-12-17 ENCOUNTER — Other Ambulatory Visit (INDEPENDENT_AMBULATORY_CARE_PROVIDER_SITE_OTHER): Payer: Federal, State, Local not specified - PPO

## 2018-12-17 ENCOUNTER — Ambulatory Visit (INDEPENDENT_AMBULATORY_CARE_PROVIDER_SITE_OTHER): Payer: Federal, State, Local not specified - PPO

## 2018-12-17 DIAGNOSIS — E785 Hyperlipidemia, unspecified: Secondary | ICD-10-CM | POA: Diagnosis not present

## 2018-12-17 DIAGNOSIS — E559 Vitamin D deficiency, unspecified: Secondary | ICD-10-CM

## 2018-12-17 DIAGNOSIS — Z23 Encounter for immunization: Secondary | ICD-10-CM

## 2018-12-17 DIAGNOSIS — R000 Tachycardia, unspecified: Secondary | ICD-10-CM | POA: Diagnosis not present

## 2018-12-17 LAB — COMPREHENSIVE METABOLIC PANEL
ALT: 17 U/L (ref 0–35)
AST: 14 U/L (ref 0–37)
Albumin: 4.4 g/dL (ref 3.5–5.2)
Alkaline Phosphatase: 67 U/L (ref 39–117)
BUN: 10 mg/dL (ref 6–23)
CO2: 25 mEq/L (ref 19–32)
Calcium: 9.5 mg/dL (ref 8.4–10.5)
Chloride: 106 mEq/L (ref 96–112)
Creatinine, Ser: 0.84 mg/dL (ref 0.40–1.20)
GFR: 80.07 mL/min (ref >60.00)
Glucose, Bld: 89 mg/dL (ref 70–99)
Potassium: 4.2 mEq/L (ref 3.5–5.1)
Sodium: 139 mEq/L (ref 135–145)
Total Bilirubin: 0.4 mg/dL (ref 0.2–1.2)
Total Protein: 6.9 g/dL (ref 6.0–8.3)

## 2018-12-17 LAB — CBC
HCT: 44.2 % (ref 36.0–46.0)
Hemoglobin: 14.8 g/dL (ref 12.0–15.0)
MCHC: 33.5 g/dL (ref 30.0–36.0)
MCV: 92.7 fl (ref 78.0–100.0)
Platelets: 263 10*3/uL (ref 150.0–400.0)
RBC: 4.77 Mil/uL (ref 3.87–5.11)
RDW: 12.9 % (ref 11.5–15.5)
WBC: 9.6 10*3/uL (ref 4.0–10.5)

## 2018-12-17 LAB — LIPID PANEL
Cholesterol: 146 mg/dL (ref 0–200)
HDL: 39 mg/dL — ABNORMAL LOW (ref >39.00)
LDL Cholesterol: 77 mg/dL (ref 0–99)
NonHDL: 107.49
Total CHOL/HDL Ratio: 4
Triglycerides: 152 mg/dL — ABNORMAL HIGH (ref 0.0–149.0)
VLDL: 30.4 mg/dL (ref 0.0–40.0)

## 2018-12-17 LAB — TSH: TSH: 1.67 u[IU]/mL (ref 0.35–4.50)

## 2018-12-17 LAB — VITAMIN D 25 HYDROXY (VIT D DEFICIENCY, FRACTURES): VITD: 24.84 ng/mL — ABNORMAL LOW (ref 30.00–100.00)

## 2018-12-17 NOTE — Telephone Encounter (Signed)
Spoke with patient regarding benefit for recommended surgical procedure. Patient acknowledges understanding of information presented. Patient is aware this is the professional benefit only. Patient is aware once procedure is scheduled, the hospital will contact her with separate benefits. Patient desires to proceed the week of January 05, 2019, states will call back to office by Friday 12/19/2018, to confirm.   cc: Lamont Snowball, RN

## 2018-12-18 ENCOUNTER — Other Ambulatory Visit: Payer: Federal, State, Local not specified - PPO | Admitting: Obstetrics & Gynecology

## 2018-12-18 ENCOUNTER — Other Ambulatory Visit: Payer: Federal, State, Local not specified - PPO

## 2018-12-18 MED ORDER — VITAMIN D (ERGOCALCIFEROL) 1.25 MG (50000 UNIT) PO CAPS: 50000.0000 [IU] | ORAL_CAPSULE | ORAL | 4 refills | Status: DC

## 2018-12-18 NOTE — Addendum Note (Signed)
Addended by: Magdalene Molly A on: 12/18/2018 09:00 AM   Modules accepted: Orders

## 2018-12-18 NOTE — Telephone Encounter (Signed)
Call to patient. Reviewed available date options. Patient requesting week of 01-05-19. Advised of limited options until week of 01-19-19. Will review schedule and call her back.

## 2018-12-18 NOTE — Telephone Encounter (Signed)
See previous message Patient called and confirmed surgery. Patient is ready to proceed with scheduling. Forwarding to Conservation officer, historic buildings for scheduling.  Routing to Lamont Snowball, RN

## 2018-12-22 NOTE — Telephone Encounter (Signed)
Call to patient. Advised of surgery date of 12-13-18 at 0730 at [facility].  Surgery instruction sheet reviewed and printed copy along with Covid testing instructions will be provided at appointment on 01-05-19.  Routing to provider. Encounter closed.

## 2018-12-23 ENCOUNTER — Other Ambulatory Visit: Payer: Self-pay | Admitting: Obstetrics & Gynecology

## 2019-01-01 ENCOUNTER — Other Ambulatory Visit: Payer: Self-pay

## 2019-01-05 ENCOUNTER — Ambulatory Visit: Payer: Federal, State, Local not specified - PPO | Admitting: Obstetrics & Gynecology

## 2019-01-05 ENCOUNTER — Encounter: Payer: Self-pay | Admitting: Obstetrics & Gynecology

## 2019-01-05 ENCOUNTER — Other Ambulatory Visit: Payer: Self-pay

## 2019-01-05 VITALS — BP 110/70 | HR 84 | Temp 97.3°F | Ht 64.5 in | Wt 196.0 lb

## 2019-01-05 DIAGNOSIS — G8929 Other chronic pain: Secondary | ICD-10-CM

## 2019-01-05 DIAGNOSIS — R102 Pelvic and perineal pain: Secondary | ICD-10-CM

## 2019-01-05 NOTE — Progress Notes (Signed)
28 y.o. G37P1011 Married White female here for discussion of upcoming procedure.  Diagnostic laparoscopy is planned on 01/12/2019 due to chronic pelvic pain.  She does have a hx of complex ovarian cyst but this resolved with her most recent PUS.  She does have PCOS appearing ovaries and has recently done a provera challenge to start a cycle.  LMP was 12/19/2018.  After procedure, we will discuss starting OCPs vs spironolactone due to mildly elevated testosterone level (57).    Procedure discussed with patient.  Hospital stay, recovery and pain management all discussed.  Risks discussed including but not limited to bleeding, <1% risk of receiving a  transfusion, infection, 1-2% risk of bowel/bladder/ureteral/vascular injury discussed as well as possible need for additional surgery if injury does occur discussed.  DVT/PE and rare risk of death discussed.  My actual complications with prior surgeries discussed.  Vaginal cuff dehiscence discussed.  Hernia formation discussed.  Positioning and incision locations discussed.  Patient aware if endometriosis is present, will obtain biopsy for documentation purposes as well.  All questions answered.     Ob Hx:   Patient's last menstrual period was 12/19/2018 (exact date).          Sexually active: Yes.   Birth control: vasectomy Last pap: 12/09/18 Neg  Last MMG: n/a Tobacco: Yes, vape  Past Surgical History:  Procedure Laterality Date  . WISDOM TOOTH EXTRACTION  2016    Past Medical History:  Diagnosis Date  . Abdominal pain 10/12/2016  . Abnormal cells of cervix    abn x 2 but normalized on follow up  . Acne 01/06/2012  . Allergic state   . Back pain, lumbosacral   . Bilateral neck pain 03/16/2016  . Bipolar 1 disorder ([place])   . Chlamydia 2015  . Contusion of leg 10/15/2012   right  . Dyslipidemia 02/18/2013  . Eczematous dermatitis of eyelid 09/09/2014  . Encounter for preconception consultation 07/27/2013  . Folliculitis 08/18/930  . Gestational  diabetes   . Irregular periods/menstrual cycles   . Left ankle sprain 04/10/2016  . Mid back pain   . Right knee pain 12/21/2016  . Vitamin D deficiency 04/10/2015    Allergies: Cymbalta [duloxetine hcl]  Current Outpatient Medications  Medication Sig Dispense Refill  . albuterol (VENTOLIN HFA) 108 (90 Base) MCG/ACT inhaler Inhale 2 puffs into the lungs every 6 (six) hours as needed for wheezing or shortness of breath. 18 g 2  . alprazolam (XANAX) 2 MG tablet Take 1 tablet (2 mg total) by mouth at bedtime as needed. for sleep. 30 tablet 5  . clobetasol cream (TEMOVATE) 0.05 % APPLY TOPICALLY DAILY AS NEEDED. ECZEMA 60 g 3  . diclofenac (VOLTAREN) 50 MG EC tablet Take 1 tablet (50 mg total) by mouth 3 (three) times daily. 90 tablet 3  . Erenumab-aooe (AIMOVIG) 140 MG/ML SOAJ Inject 140 mg into the skin every 30 (thirty) days. 1 pen 11  . fluticasone (FLONASE) 50 MCG/ACT nasal spray     . lamoTRIgine (LAMICTAL) 150 MG tablet Take 1 tablet (150 mg total) by mouth daily. 30 tablet 5  . metFORMIN (GLUCOPHAGE) 500 MG tablet Take 1 tab qam x 2 weeks and then increase to 1 tab bid 42 tablet 1  . promethazine (PHENERGAN) 25 MG tablet Take 1 tablet (25 mg total) by mouth every 8 (eight) hours as needed for nausea or vomiting. 30 tablet 2  . propranolol (INDERAL) 20 MG tablet TAKE 1-2 TABLETS (20-40 MG TOTAL) BY MOUTH 3 (  THREE) TIMES DAILY. 180 tablet 2  . rizatriptan (MAXALT) 10 MG tablet Take 1 tablet (10 mg total) by mouth as needed for migraine. May repeat in 2 hours if needed 10 tablet 0  . tiZANidine (ZANAFLEX) 4 MG tablet Take 1 tablet (4 mg total) by mouth every 8 (eight) hours as needed for muscle spasms. 21 tablet 0  . Vitamin D, Ergocalciferol, (DRISDOL) 1.25 MG (50000 UT) CAPS capsule Take 1 capsule (50,000 Units total) by mouth every 7 (seven) days. 4 capsule 4   No current facility-administered medications for this visit.     ROS: A comprehensive review of systems was negative.  Exam:     BP 110/70   Pulse 84   Temp (!) 97.3 F (36.3 C) (Temporal)   Ht 5' 4.5" (1.638 m)   Wt 196 lb (88.9 kg)   LMP 12/19/2018 (Exact Date)   BMI 33.12 kg/m   General appearance: alert and cooperative Lungs: clear to auscultation bilaterally Heart: regular rate and rhythm, S1, S2 normal, no murmur, click, rub or gallop No other exam performed  A: Chronic pelvic pain   PCOS  P:  Diagnostic laparoscopy planned Rx for Motrin and Percocet given. Medications/Vitamins reviewed.

## 2019-01-07 ENCOUNTER — Encounter (HOSPITAL_COMMUNITY)
Admission: RE | Admit: 2019-01-07 | Discharge: 2019-01-07 | Disposition: A | Discharge status: Home / Self Care | Payer: Federal, State, Local not specified - PPO | Source: Ambulatory Visit | Attending: Obstetrics & Gynecology | Admitting: Obstetrics & Gynecology

## 2019-01-07 ENCOUNTER — Other Ambulatory Visit: Payer: Self-pay

## 2019-01-07 ENCOUNTER — Telehealth: Payer: Self-pay | Admitting: *Deleted

## 2019-01-07 DIAGNOSIS — R102 Pelvic and perineal pain: Secondary | ICD-10-CM | POA: Diagnosis not present

## 2019-01-07 DIAGNOSIS — Z01812 Encounter for preprocedural laboratory examination: Secondary | ICD-10-CM | POA: Insufficient documentation

## 2019-01-07 DIAGNOSIS — F17200 Nicotine dependence, unspecified, uncomplicated: Secondary | ICD-10-CM | POA: Insufficient documentation

## 2019-01-07 LAB — CBC
HCT: 43.9 % (ref 36.0–46.0)
Hemoglobin: 14.5 g/dL (ref 12.0–15.0)
MCH: 30.8 pg (ref 26.0–34.0)
MCHC: 33 g/dL (ref 30.0–36.0)
MCV: 93.2 fL (ref 80.0–100.0)
Platelets: 267 10*3/uL (ref 150–400)
RBC: 4.71 MIL/uL (ref 3.87–5.11)
RDW: 12.7 % (ref 11.5–15.5)
WBC: 8 10*3/uL (ref 4.0–10.5)
nRBC: 0 % (ref 0.0–0.2)

## 2019-01-07 LAB — ABO/RH: ABO/RH(D): B POS

## 2019-01-07 NOTE — Telephone Encounter (Signed)
Patient notified of surgery time change to 0830 on 01-12-19.  Encounter closed.

## 2019-01-08 ENCOUNTER — Other Ambulatory Visit (HOSPITAL_COMMUNITY)
Admission: RE | Admit: 2019-01-08 | Discharge: 2019-01-08 | Disposition: A | Discharge status: Home / Self Care | Payer: Federal, State, Local not specified - PPO | Source: Ambulatory Visit | Attending: Obstetrics & Gynecology | Admitting: Obstetrics & Gynecology

## 2019-01-08 DIAGNOSIS — Z20828 Contact with and (suspected) exposure to other viral communicable diseases: Secondary | ICD-10-CM | POA: Diagnosis not present

## 2019-01-08 DIAGNOSIS — Z01812 Encounter for preprocedural laboratory examination: Secondary | ICD-10-CM | POA: Insufficient documentation

## 2019-01-09 ENCOUNTER — Encounter (HOSPITAL_BASED_OUTPATIENT_CLINIC_OR_DEPARTMENT_OTHER): Payer: Self-pay | Admitting: *Deleted

## 2019-01-09 ENCOUNTER — Other Ambulatory Visit: Payer: Self-pay

## 2019-01-09 NOTE — Progress Notes (Signed)
Spoke w/ via phone for pre-op interview--- PT Lab needs dos---- Urine preg           Lab results------  CBC, T&S done 01-07-2019 in chart/ epic COVID test ------ 01-08-2019 Arrive at ------- 0630 NPO after ------ MN Medications to take morning of surgery ----- Propranlol and if needed take maxalt/ phenergan w/ sips of water Diabetic medication ----- n/a Patient Special Instructions ----- asked to bring rescue inhaler dos Pre-Op special Istructions ----- n/a Patient verbalized understanding of instructions that were given at this phone interview. Patient denies shortness of breath, chest pain, fever, cough a this phone interview.

## 2019-01-11 LAB — NOVEL CORONAVIRUS, NAA (HOSP ORDER, SEND-OUT TO REF LAB; TAT 18-24 HRS): SARS-CoV-2, NAA: NOT DETECTED

## 2019-01-12 ENCOUNTER — Ambulatory Visit (HOSPITAL_BASED_OUTPATIENT_CLINIC_OR_DEPARTMENT_OTHER): Payer: Federal, State, Local not specified - PPO

## 2019-01-12 ENCOUNTER — Ambulatory Visit (HOSPITAL_BASED_OUTPATIENT_CLINIC_OR_DEPARTMENT_OTHER): Payer: Federal, State, Local not specified - PPO | Admitting: Anesthesiology

## 2019-01-12 ENCOUNTER — Encounter (HOSPITAL_BASED_OUTPATIENT_CLINIC_OR_DEPARTMENT_OTHER): Payer: Self-pay | Admitting: Emergency Medicine

## 2019-01-12 ENCOUNTER — Ambulatory Visit (HOSPITAL_BASED_OUTPATIENT_CLINIC_OR_DEPARTMENT_OTHER)
Admission: RE | Admit: 2019-01-12 | Discharge: 2019-01-12 | Disposition: A | Discharge status: Home / Self Care | Payer: Federal, State, Local not specified - PPO | Source: Other Acute Inpatient Hospital | Attending: Obstetrics & Gynecology | Admitting: Obstetrics & Gynecology

## 2019-01-12 ENCOUNTER — Encounter (HOSPITAL_BASED_OUTPATIENT_CLINIC_OR_DEPARTMENT_OTHER)
Admission: RE | Disposition: A | Discharge status: Home / Self Care | Payer: Self-pay | Source: Other Acute Inpatient Hospital | Attending: Obstetrics & Gynecology

## 2019-01-12 ENCOUNTER — Other Ambulatory Visit: Payer: Self-pay

## 2019-01-12 DIAGNOSIS — G8929 Other chronic pain: Secondary | ICD-10-CM | POA: Insufficient documentation

## 2019-01-12 DIAGNOSIS — F319 Bipolar disorder, unspecified: Secondary | ICD-10-CM | POA: Insufficient documentation

## 2019-01-12 DIAGNOSIS — Z7984 Long term (current) use of oral hypoglycemic drugs: Secondary | ICD-10-CM | POA: Insufficient documentation

## 2019-01-12 DIAGNOSIS — L0231 Cutaneous abscess of buttock: Secondary | ICD-10-CM | POA: Insufficient documentation

## 2019-01-12 DIAGNOSIS — E282 Polycystic ovarian syndrome: Secondary | ICD-10-CM | POA: Diagnosis not present

## 2019-01-12 DIAGNOSIS — Z79899 Other long term (current) drug therapy: Secondary | ICD-10-CM | POA: Diagnosis not present

## 2019-01-12 DIAGNOSIS — E785 Hyperlipidemia, unspecified: Secondary | ICD-10-CM | POA: Diagnosis not present

## 2019-01-12 DIAGNOSIS — E559 Vitamin D deficiency, unspecified: Secondary | ICD-10-CM | POA: Insufficient documentation

## 2019-01-12 DIAGNOSIS — Z888 Allergy status to other drugs, medicaments and biological substances status: Secondary | ICD-10-CM | POA: Insufficient documentation

## 2019-01-12 DIAGNOSIS — R102 Pelvic and perineal pain: Secondary | ICD-10-CM | POA: Diagnosis not present

## 2019-01-12 DIAGNOSIS — L723 Sebaceous cyst: Secondary | ICD-10-CM | POA: Diagnosis not present

## 2019-01-12 DIAGNOSIS — G43909 Migraine, unspecified, not intractable, without status migrainosus: Secondary | ICD-10-CM | POA: Diagnosis not present

## 2019-01-12 DIAGNOSIS — F411 Generalized anxiety disorder: Secondary | ICD-10-CM | POA: Insufficient documentation

## 2019-01-12 DIAGNOSIS — N912 Amenorrhea, unspecified: Secondary | ICD-10-CM | POA: Diagnosis not present

## 2019-01-12 DIAGNOSIS — F17210 Nicotine dependence, cigarettes, uncomplicated: Secondary | ICD-10-CM | POA: Diagnosis not present

## 2019-01-12 DIAGNOSIS — J45909 Unspecified asthma, uncomplicated: Secondary | ICD-10-CM | POA: Insufficient documentation

## 2019-01-12 DIAGNOSIS — N736 Female pelvic peritoneal adhesions (postinfective): Secondary | ICD-10-CM | POA: Diagnosis not present

## 2019-01-12 HISTORY — DX: Polycystic ovarian syndrome: E28.2

## 2019-01-12 HISTORY — DX: Personal history of other diseases of the female genital tract: Z87.42

## 2019-01-12 HISTORY — DX: Generalized anxiety disorder: F41.1

## 2019-01-12 HISTORY — DX: Unspecified asthma, uncomplicated: J45.909

## 2019-01-12 HISTORY — PX: LAPAROSCOPY: SHX197

## 2019-01-12 HISTORY — DX: Personal history of gestational diabetes: Z86.32

## 2019-01-12 HISTORY — DX: Gastro-esophageal reflux disease without esophagitis: K21.9

## 2019-01-12 HISTORY — DX: Presence of spectacles and contact lenses: Z97.3

## 2019-01-12 HISTORY — PX: INCISION AND DRAINAGE: SHX5863

## 2019-01-12 HISTORY — DX: Migraine, unspecified, not intractable, without status migrainosus: G43.909

## 2019-01-12 HISTORY — DX: Other chronic pain: G89.29

## 2019-01-12 LAB — TYPE AND SCREEN
ABO/RH(D): B POS
Antibody Screen: NEGATIVE

## 2019-01-12 LAB — POCT PREGNANCY, URINE: Preg Test, Ur: NEGATIVE

## 2019-01-12 SURGERY — LAPAROSCOPY, DIAGNOSTIC
Anesthesia: General | Site: Buttocks | Laterality: Right

## 2019-01-12 MED ORDER — GABAPENTIN 100 MG PO CAPS
100.0000 mg | ORAL_CAPSULE | ORAL | Status: AC
  Administered 2019-01-12: 07:00:00 100 mg via ORAL
  Filled 2019-01-12 (×2): qty 1

## 2019-01-12 MED ORDER — ONDANSETRON HCL 4 MG/2ML IJ SOLN
INTRAMUSCULAR | Status: DC | PRN
  Administered 2019-01-12: 4 mg via INTRAVENOUS

## 2019-01-12 MED ORDER — OXYCODONE-ACETAMINOPHEN 5-325 MG PO TABS
ORAL_TABLET | ORAL | Status: AC
  Filled 2019-01-12: qty 1

## 2019-01-12 MED ORDER — CEFAZOLIN SODIUM-DEXTROSE 2-4 GM/100ML-% IV SOLN
2.0000 g | Freq: Once | INTRAVENOUS | Status: AC
  Administered 2019-01-12: 09:00:00 2 g via INTRAVENOUS
  Filled 2019-01-12: qty 100

## 2019-01-12 MED ORDER — SUGAMMADEX SODIUM 200 MG/2ML IV SOLN
INTRAVENOUS | Status: DC | PRN
  Administered 2019-01-12: 200 mg via INTRAVENOUS

## 2019-01-12 MED ORDER — PROMETHAZINE HCL 25 MG/ML IJ SOLN
6.2500 mg | INTRAMUSCULAR | Status: DC | PRN
  Filled 2019-01-12: qty 1

## 2019-01-12 MED ORDER — KETOROLAC TROMETHAMINE 30 MG/ML IJ SOLN
INTRAMUSCULAR | Status: DC | PRN
  Administered 2019-01-12: 30 mg via INTRAVENOUS

## 2019-01-12 MED ORDER — DEXAMETHASONE SODIUM PHOSPHATE 10 MG/ML IJ SOLN
INTRAMUSCULAR | Status: DC | PRN
  Administered 2019-01-12: 10 mg via INTRAVENOUS

## 2019-01-12 MED ORDER — PHENYLEPHRINE 40 MCG/ML (10ML) SYRINGE FOR IV PUSH (FOR BLOOD PRESSURE SUPPORT)
PREFILLED_SYRINGE | INTRAVENOUS | Status: DC | PRN
  Administered 2019-01-12: 80 ug via INTRAVENOUS

## 2019-01-12 MED ORDER — PROPOFOL 10 MG/ML IV BOLUS
INTRAVENOUS | Status: AC
  Filled 2019-01-12: qty 40

## 2019-01-12 MED ORDER — IBUPROFEN 800 MG PO TABS: 800.0000 mg | ORAL_TABLET | Freq: Three times a day (TID) | ORAL | 0 refills | Status: DC | PRN

## 2019-01-12 MED ORDER — PROPOFOL 10 MG/ML IV BOLUS
INTRAVENOUS | Status: DC | PRN
  Administered 2019-01-12: 200 mg via INTRAVENOUS

## 2019-01-12 MED ORDER — LIDOCAINE 2% (20 MG/ML) 5 ML SYRINGE
INTRAMUSCULAR | Status: DC | PRN
  Administered 2019-01-12: 100 mg via INTRAVENOUS

## 2019-01-12 MED ORDER — HYDROCODONE-ACETAMINOPHEN 5-325 MG PO TABS: 1.0000 | ORAL_TABLET | Freq: Four times a day (QID) | ORAL | 0 refills | Status: DC | PRN

## 2019-01-12 MED ORDER — ACETAMINOPHEN 500 MG PO TABS
ORAL_TABLET | ORAL | Status: AC
  Filled 2019-01-12: qty 2

## 2019-01-12 MED ORDER — ROCURONIUM BROMIDE 10 MG/ML (PF) SYRINGE
PREFILLED_SYRINGE | INTRAVENOUS | Status: AC
  Filled 2019-01-12: qty 10

## 2019-01-12 MED ORDER — OXYCODONE HCL 5 MG PO TABS
5.0000 mg | ORAL_TABLET | ORAL | Status: AC | PRN
  Administered 2019-01-12: 5 mg via ORAL
  Filled 2019-01-12: qty 1

## 2019-01-12 MED ORDER — CEFAZOLIN SODIUM-DEXTROSE 2-4 GM/100ML-% IV SOLN
INTRAVENOUS | Status: AC
  Filled 2019-01-12: qty 100

## 2019-01-12 MED ORDER — BUPIVACAINE HCL 0.25 % IJ SOLN
INTRAMUSCULAR | Status: DC | PRN
  Administered 2019-01-12: 6 mL

## 2019-01-12 MED ORDER — ROCURONIUM BROMIDE 10 MG/ML (PF) SYRINGE
PREFILLED_SYRINGE | INTRAVENOUS | Status: DC | PRN
  Administered 2019-01-12: 60 mg via INTRAVENOUS

## 2019-01-12 MED ORDER — DEXAMETHASONE SODIUM PHOSPHATE 10 MG/ML IJ SOLN
INTRAMUSCULAR | Status: AC
  Filled 2019-01-12: qty 1

## 2019-01-12 MED ORDER — LACTATED RINGERS IV SOLN
INTRAVENOUS | Status: DC
  Administered 2019-01-12 (×2): via INTRAVENOUS
  Filled 2019-01-12: qty 1000

## 2019-01-12 MED ORDER — FENTANYL CITRATE (PF) 100 MCG/2ML IJ SOLN
25.0000 ug | INTRAMUSCULAR | Status: DC | PRN
  Filled 2019-01-12: qty 1

## 2019-01-12 MED ORDER — LIDOCAINE HCL 1 % IJ SOLN
INTRAMUSCULAR | Status: DC | PRN
  Administered 2019-01-12: 4 mL

## 2019-01-12 MED ORDER — KETOROLAC TROMETHAMINE 30 MG/ML IJ SOLN
INTRAMUSCULAR | Status: AC
  Filled 2019-01-12: qty 1

## 2019-01-12 MED ORDER — SULFAMETHOXAZOLE-TRIMETHOPRIM 800-160 MG PO TABS: 1.0000 | ORAL_TABLET | Freq: Two times a day (BID) | ORAL | 0 refills | Status: DC

## 2019-01-12 MED ORDER — GABAPENTIN 300 MG PO CAPS
ORAL_CAPSULE | ORAL | Status: AC
  Filled 2019-01-12: qty 1

## 2019-01-12 MED ORDER — FLUCONAZOLE 150 MG PO TABS: 150.0000 mg | ORAL_TABLET | Freq: Once | ORAL | 0 refills | Status: DC

## 2019-01-12 MED ORDER — ACETAMINOPHEN 500 MG PO TABS
1000.0000 mg | ORAL_TABLET | ORAL | Status: AC
  Administered 2019-01-12: 1000 mg via ORAL
  Filled 2019-01-12: qty 2

## 2019-01-12 MED ORDER — ONDANSETRON HCL 4 MG/2ML IJ SOLN
INTRAMUSCULAR | Status: AC
  Filled 2019-01-12: qty 2

## 2019-01-12 MED ORDER — PHENYLEPHRINE 40 MCG/ML (10ML) SYRINGE FOR IV PUSH (FOR BLOOD PRESSURE SUPPORT)
PREFILLED_SYRINGE | INTRAVENOUS | Status: AC
  Filled 2019-01-12: qty 10

## 2019-01-12 MED ORDER — MIDAZOLAM HCL 2 MG/2ML IJ SOLN
INTRAMUSCULAR | Status: DC | PRN
  Administered 2019-01-12: 2 mg via INTRAVENOUS

## 2019-01-12 MED ORDER — FENTANYL CITRATE (PF) 100 MCG/2ML IJ SOLN
INTRAMUSCULAR | Status: AC
  Filled 2019-01-12: qty 2

## 2019-01-12 MED ORDER — FENTANYL CITRATE (PF) 100 MCG/2ML IJ SOLN
INTRAMUSCULAR | Status: DC | PRN
  Administered 2019-01-12 (×3): 50 ug via INTRAVENOUS

## 2019-01-12 MED ORDER — MIDAZOLAM HCL 2 MG/2ML IJ SOLN
INTRAMUSCULAR | Status: AC
  Filled 2019-01-12: qty 2

## 2019-01-12 MED ORDER — SILVER NITRATE-POT NITRATE 75-25 % EX MISC
CUTANEOUS | Status: DC | PRN
  Administered 2019-01-12: 1

## 2019-01-12 MED ORDER — LIDOCAINE 2% (20 MG/ML) 5 ML SYRINGE
INTRAMUSCULAR | Status: AC
  Filled 2019-01-12: qty 5

## 2019-01-12 MED ORDER — OXYCODONE HCL 5 MG PO TABS
ORAL_TABLET | ORAL | Status: AC
  Filled 2019-01-12: qty 1

## 2019-01-12 SURGICAL SUPPLY — 70 items
APPLICATOR ARISTA FLEXITIP XL (MISCELLANEOUS) IMPLANT
BENZOIN TINCTURE PRP APPL 2/3 (GAUZE/BANDAGES/DRESSINGS) IMPLANT
BLADE 11 SAFETY STRL DISP (BLADE) ×3 IMPLANT
BLADE CLIPPER SENSICLIP SURGIC (BLADE) IMPLANT
CABLE HIGH FREQUENCY MONO STRZ (ELECTRODE) ×3 IMPLANT
CANISTER SUCT 3000ML PPV (MISCELLANEOUS) IMPLANT
CANISTER SUCTION 1200CC (MISCELLANEOUS) IMPLANT
CATH ROBINSON RED A/P 16FR (CATHETERS) IMPLANT
COVER MAYO STAND STRL (DRAPES) ×3 IMPLANT
COVER WAND RF STERILE (DRAPES) ×3 IMPLANT
DECANTER SPIKE VIAL GLASS SM (MISCELLANEOUS) ×3 IMPLANT
DERMABOND ADVANCED (GAUZE/BANDAGES/DRESSINGS) ×1
DERMABOND ADVANCED .7 DNX12 (GAUZE/BANDAGES/DRESSINGS) ×2 IMPLANT
DRSG COVADERM PLUS 2X2 (GAUZE/BANDAGES/DRESSINGS) IMPLANT
DRSG OPSITE POSTOP 3X4 (GAUZE/BANDAGES/DRESSINGS) IMPLANT
DRSG TEGADERM 2-3/8X2-3/4 SM (GAUZE/BANDAGES/DRESSINGS) ×3 IMPLANT
DRSG TEGADERM 4X4.75 (GAUZE/BANDAGES/DRESSINGS) ×6 IMPLANT
DRSG TELFA 3X8 NADH (GAUZE/BANDAGES/DRESSINGS) ×3 IMPLANT
DURAPREP 26ML APPLICATOR (WOUND CARE) ×3 IMPLANT
GAUZE 4X4 16PLY RFD (DISPOSABLE) ×6 IMPLANT
GLOVE BIOGEL PI IND STRL 7.0 (GLOVE) ×6 IMPLANT
GLOVE BIOGEL PI INDICATOR 7.0 (GLOVE) ×3
GLOVE ECLIPSE 6.5 STRL STRAW (GLOVE) ×9 IMPLANT
GOWN STRL REUS W/TWL LRG LVL3 (GOWN DISPOSABLE) ×3 IMPLANT
HEMOSTAT ARISTA ABSORB 3G PWDR (HEMOSTASIS) IMPLANT
KIT TURNOVER CYSTO (KITS) ×3 IMPLANT
LIGASURE VESSEL 5MM BLUNT TIP (ELECTROSURGICAL) IMPLANT
NEEDLE HYPO 25X1 1.5 SAFETY (NEEDLE) ×3 IMPLANT
NEEDLE INSUFFLATION 120MM (ENDOMECHANICALS) ×3 IMPLANT
NEEDLE INSUFFLATION 14GA 150MM (NEEDLE) IMPLANT
NS IRRIG 500ML POUR BTL (IV SOLUTION) ×3 IMPLANT
PACK LAPAROSCOPY BASIN (CUSTOM PROCEDURE TRAY) ×3 IMPLANT
PACK TRENDGUARD 450 HYBRID PRO (MISCELLANEOUS) ×2 IMPLANT
PAD OB MATERNITY 4.3X12.25 (PERSONAL CARE ITEMS) ×3 IMPLANT
POUCH LAPAROSCOPIC INSTRUMENT (MISCELLANEOUS) ×3 IMPLANT
PROTECTOR NERVE ULNAR (MISCELLANEOUS) ×3 IMPLANT
SCISSORS LAP 5X35 DISP (ENDOMECHANICALS) IMPLANT
SEALER TISSUE G2 CVD JAW 35 (ENDOMECHANICALS) IMPLANT
SEALER TISSUE G2 CVD JAW 45CM (ENDOMECHANICALS)
SET IRRIG TUBING LAPAROSCOPIC (IRRIGATION / IRRIGATOR) IMPLANT
SET TUBE SMOKE EVAC HIGH FLOW (TUBING) ×3 IMPLANT
SHEARS HARMONIC ACE PLUS 36CM (ENDOMECHANICALS) IMPLANT
SLEEVE ADV FIXATION 5X100MM (TROCAR) ×3 IMPLANT
SOLUTION ELECTROLUBE (MISCELLANEOUS) IMPLANT
SPONGE GAUZE 2X2 8PLY STRL LF (GAUZE/BANDAGES/DRESSINGS) ×9 IMPLANT
STRIP CLOSURE SKIN 1/4X4 (GAUZE/BANDAGES/DRESSINGS) IMPLANT
SUT VIC AB 2-0 SH 27 (SUTURE) ×1
SUT VIC AB 2-0 SH 27XBRD (SUTURE) ×2 IMPLANT
SUT VIC AB 4-0 SH 27 (SUTURE) ×1
SUT VIC AB 4-0 SH 27XANBCTRL (SUTURE) ×2 IMPLANT
SUT VICRYL 0 UR6 27IN ABS (SUTURE) IMPLANT
SUT VICRYL 4-0 PS2 18IN ABS (SUTURE) ×3 IMPLANT
SWAB COLLECTION DEVICE MRSA (MISCELLANEOUS) ×3 IMPLANT
SWAB CULTURE ESWAB REG 1ML (MISCELLANEOUS) ×3 IMPLANT
SYR 10ML LL (SYRINGE) ×3 IMPLANT
SYR 30ML LL (SYRINGE) IMPLANT
SYR 3ML 23GX1 SAFETY (SYRINGE) IMPLANT
SYR CONTROL 10ML LL (SYRINGE) ×3 IMPLANT
SYS BAG RETRIEVAL 10MM (BASKET)
SYSTEM BAG RETRIEVAL 10MM (BASKET) IMPLANT
SYSTEM CARTER THOMASON II (TROCAR) IMPLANT
TOWEL OR 17X26 10 PK STRL BLUE (TOWEL DISPOSABLE) ×3 IMPLANT
TRAY FOLEY W/BAG SLVR 14FR (SET/KITS/TRAYS/PACK) ×3 IMPLANT
TRENDGUARD 450 HYBRID PRO PACK (MISCELLANEOUS) ×3
TROCAR ADV FIXATION 5X100MM (TROCAR) ×3 IMPLANT
TROCAR XCEL NON-BLD 11X100MML (ENDOMECHANICALS) IMPLANT
TROCAR XCEL NON-BLD 5MMX100MML (ENDOMECHANICALS) ×6 IMPLANT
TUBE CONNECTING 12X1/4 (SUCTIONS) IMPLANT
WARMER LAPAROSCOPE (MISCELLANEOUS) ×3 IMPLANT
WATER STERILE IRR 500ML POUR (IV SOLUTION) IMPLANT

## 2019-01-12 NOTE — Anesthesia Procedure Notes (Addendum)
Procedure Name: Intubation Date/Time: 01/12/2019 8:57 AM Performed by: Mechele Claude, CRNA Pre-anesthesia Checklist: Patient identified, Emergency Drugs available, Suction available and Patient being monitored Patient Re-evaluated:Patient Re-evaluated prior to induction Oxygen Delivery Method: Circle system utilized Preoxygenation: Pre-oxygenation with 100% oxygen Induction Type: IV induction Ventilation: Mask ventilation without difficulty Laryngoscope Size: Mac and 3 Grade View: Grade I Tube type: Oral Tube size: 7.0 mm Number of attempts: 1 Airway Equipment and Method: Stylet and Oral airway Placement Confirmation: ETT inserted through vocal cords under direct vision,  positive ETCO2 and breath sounds checked- equal and bilateral Secured at: 22 cm Tube secured with: Tape Dental Injury: Teeth and Oropharynx as per pre-operative assessment

## 2019-01-12 NOTE — Anesthesia Postprocedure Evaluation (Signed)
Anesthesia Post Note  Patient: Jillian Reyes  Procedure(s) Performed: LAPAROSCOPY DIAGNOSTIC; LYSIS OF ADHESIONS (N/A Abdomen) INCISION AND DRAINAGE OF LESION (Right Buttocks)     Patient location during evaluation: PACU Anesthesia Type: General Level of consciousness: awake and alert Pain management: pain level controlled Vital Signs Assessment: post-procedure vital signs reviewed and stable Respiratory status: spontaneous breathing, nonlabored ventilation, respiratory function stable and patient connected to nasal cannula oxygen Cardiovascular status: blood pressure returned to baseline and stable Postop Assessment: no apparent nausea or vomiting Anesthetic complications: no    Last Vitals:  Vitals:   01/12/19 1145 01/12/19 1235  BP: 110/74 106/71  Pulse: (!) 59 79  Resp: 13 14  Temp:  (!) 36.4 C  SpO2: 95% 95%    Last Pain:  Vitals:   01/12/19 1240  TempSrc:   PainSc: 4                  Tiajuana Amass

## 2019-01-12 NOTE — Discharge Instructions (Signed)
Post Anesthesia Home Care Instructions  Activity: Get plenty of rest for the remainder of the day. A responsible individual must stay with you for 24 hours following the procedure.  For the next 24 hours, DO NOT: -Drive a car -Advertising copywriter -Drink alcoholic beverages -Take any medication unless instructed by your physician -Make any legal decisions or sign important papers.  Meals: Start with liquid foods such as gelatin or soup. Progress to regular foods as tolerated. Avoid greasy, spicy, heavy foods. If nausea and/or vomiting occur, drink only clear liquids until the nausea and/or vomiting subsides. Call your physician if vomiting continues.  Special Instructions/Symptoms: Your throat may feel dry or sore from the anesthesia or the breathing tube placed in your throat during surgery. If this causes discomfort, gargle with warm salt water. The discomfort should disappear within 24 hours.  If you had a scopolamine patch placed behind your ear for the management of post- operative nausea and/or vomiting:  1. The medication in the patch is effective for 72 hours, after which it should be removed.  Wrap patch in a tissue and discard in the trash. Wash hands thoroughly with soap and water. 2. You may remove the patch earlier than 72 hours if you experience unpleasant side effects which may include dry mouth, dizziness or visual disturbances. 3. Avoid touching the patch. Wash your hands with soap and water after contact with the patch.    Post-surgical Instructions, Outpatient Surgery  You may expect to feel dizzy, weak, and drowsy for as long as 24 hours after receiving the medicine that made you sleep (anesthetic). For the first 24 hours after your surgery:    Do not drive a car, ride a bicycle, participate in physical activities, or take public transportation until you are done taking narcotic pain medicines or as directed by Dr. Hyacinth Meeker.   Do not drink alcohol or take tranquilizers.     Do not take medicine that has not been prescribed by your physicians.   Do not sign important papers or make important decisions while on narcotic pain medicines.   Have a responsible person with you.   CARE OF INCISION  If you have a bandage, you may remove it in one day.  If there are steri-strips or dermabond, just let this loosen on its own.   You may shower on the first day after your surgery.  Do not sit in a tub bath for one week.  Avoid heavy lifting (more than 10 pounds/4.5 kilograms), pushing, or pulling.   Avoid activities that may risk injury to your incisions.   PAIN MANAGEMENT  Motrin 800mg .  (This is the same as 4-200mg  over the counter tablets of Motrin or ibuprofen.)  You may take this every eight hours or as needed for cramping.    Vicodin 5/325mg .  For more severe pain, take one or two tablets every four to six hours as needed for pain control.  (Remember that narcotic pain medications increase your risk of constipation.  If this becomes a problem, you may take an over the counter stool softener like Colace 100mg  up to four times a day.)  Please take the antibiotic for two weeks and the Diflucan if you need for possible yeast vaginitis symptoms.  DO'S AND DON'T'S  Do not take a tub bath for one week.  You may shower on the first day after your surgery  Do not do any heavy lifting for one to two weeks.  This increases the chance of bleeding.  Do move around as you feel able.  Stairs are fine.  You may begin to exercise again as you feel able.  Do not lift any weights for two weeks.  Do not put anything in the vagina for two weeks--no tampons, intercourse, or douching.    REGULAR MEDIATIONS/VITAMINS:  You may restart all of your regular medications as prescribed.  You may restart all of your vitamins as you normally take them.    PLEASE CALL OR SEEK MEDICAL CARE IF:  You have persistent nausea and vomiting.   You have trouble eating or drinking.    You have an oral temperature above 100.5.   You have constipation that is not helped by adjusting diet or increasing fluid intake. Pain medicines are a common cause of constipation.   You have heavy vaginal bleeding  You have redness or drainage from your incision(s) or there is increasing pain or tenderness near or in the surgical site.

## 2019-01-12 NOTE — Transfer of Care (Signed)
  Last Vitals:  Vitals Value Taken Time  BP 124/77 01/12/19 1030  Temp 36.3 C 01/12/19 1030  Pulse 96 01/12/19 1034  Resp 17 01/12/19 1034  SpO2 100 % 01/12/19 1034  Vitals shown include unvalidated device data.  Last Pain:  Vitals:   01/12/19 0707  TempSrc: Oral      Patients Stated Pain Goal: 5 (01/12/19 0707)  Immediate Anesthesia Transfer of Care Note  Patient: Jillian Reyes  Procedure(s) Performed: Procedure(s) (LRB): LAPAROSCOPY DIAGNOSTIC; LYSIS OF ADHESIONS (N/A) INCISION AND DRAINAGE OF LESION (Right)  Patient Location: PACU  Anesthesia Type: General  Level of Consciousness: awake, alert  and oriented  Airway & Oxygen Therapy: Patient Spontanous Breathing and Patient connected to nasal cannula oxygen  Post-op Assessment: Report given to PACU RN and Post -op Vital signs reviewed and stable  Post vital signs: Reviewed and stable  Complications: No apparent anesthesia complications

## 2019-01-12 NOTE — H&P (Signed)
Jillian Reyes is an 28 y.o. female G2P1 MWF with hx of chronic pelvic pain who is here for additional evaluation with diagnostic laparoscopy.  She has been on OCPs without any significant improvement.  Risks, benefits and alternatives have been discussed.  She reports today having a recurrent buttocks abscess.  This has been treated multiple times with antibiotics with Dr. Randel Pigg.  She would like me to evaluate this today as well.  Pertinent Gynecological History: Menses: regular Contraception: vasectomy DES exposure: denies Blood transfusions: none Sexually transmitted diseases: no past history Previous GYN Procedures: none  Last mammogram: n/a due to age Last pap: normal Date: 11/2018 OB History: G0, P0   Menstrual History: Patient's last menstrual period was 12/19/2018 (exact date).    Past Medical History:  Diagnosis Date  . Acne   . Asthma due to environmental allergies    per pt moderate, prn inhaler  . Bipolar 1 disorder ([place])   . Chronic back pain   . Chronic pelvic pain in female   . Dyslipidemia 02/18/2013  . Eczema   . GAD (generalized anxiety disorder)   . GERD (gastroesophageal reflux disease)   . History of abnormal cervical Pap smear   . History of chlamydia 2015  . History of gestational diabetes   . Irregular periods/menstrual cycles   . Migraines   . PCOS (polycystic ovarian syndrome)   . Vitamin D deficiency 04/10/2015  . Wears glasses     Past Surgical History:  Procedure Laterality Date  . NO PAST SURGERIES    . WISDOM TOOTH EXTRACTION  2016    Family History  Problem Relation Age of Onset  . Heart disease Other   . Diabetes Father 24       type 2  . Thyroid disease Father   . Brain cancer Father   . Cancer Father        tumor of the brain cancer  . Migraines Mother   . Kidney disease Mother   . Colon polyps Mother   . Hyperlipidemia Other        great grandparents    Social History:  reports that she has been smoking cigarettes. She  has smoked for the past 13.00 years. She has never used smokeless tobacco. She reports previous alcohol use. She reports that she does not use drugs.  Allergies:  Allergies  Allergen Reactions  . Cymbalta [Duloxetine Hcl] Nausea And Vomiting    Medications Prior to Admission  Medication Sig Dispense Refill Last Dose  . acetaminophen (TYLENOL) 500 MG tablet Take 500 mg by mouth every 6 (six) hours as needed.     Marland Kitchen albuterol (VENTOLIN HFA) 108 (90 Base) MCG/ACT inhaler Inhale 2 puffs into the lungs every 6 (six) hours as needed for wheezing or shortness of breath. 18 g 2   . alprazolam (XANAX) 2 MG tablet Take 1 tablet (2 mg total) by mouth at bedtime as needed. for sleep. 30 tablet 5   . aspirin-acetaminophen-caffeine (EXCEDRIN MIGRAINE) 341-937-90 MG tablet Take by mouth every 6 (six) hours as needed for headache.   01/09/2019 at Unknown time  . calcium carbonate (TUMS - DOSED IN MG ELEMENTAL CALCIUM) 500 MG chewable tablet Chew 1 tablet by mouth as needed for indigestion or heartburn.     . Cholecalciferol (VITAMIN D3) 50 MCG (2000 UT) TABS Take by mouth daily.   Past Week at Unknown time  . clobetasol cream (TEMOVATE) 0.05 % APPLY TOPICALLY DAILY AS NEEDED. ECZEMA 60 g 3   .  Erenumab-aooe (AIMOVIG) 140 MG/ML SOAJ Inject 140 mg into the skin every 30 (thirty) days. 1 pen 11   . fluticasone (FLONASE) 50 MCG/ACT nasal spray Place 1 spray into both nostrils 2 (two) times daily.      Marland Kitchen lamoTRIgine (LAMICTAL) 150 MG tablet Take 1 tablet (150 mg total) by mouth daily. (Patient taking differently: Take 150 mg by mouth every evening. ) 30 tablet 5   . promethazine (PHENERGAN) 25 MG tablet Take 1 tablet (25 mg total) by mouth every 8 (eight) hours as needed for nausea or vomiting. 30 tablet 2   . propranolol (INDERAL) 20 MG tablet TAKE 1-2 TABLETS (20-40 MG TOTAL) BY MOUTH 3 (THREE) TIMES DAILY. (Patient taking differently: Take 20-40 mg by mouth 3 (three) times daily. ) 180 tablet 2   . rizatriptan  (MAXALT) 10 MG tablet Take 1 tablet (10 mg total) by mouth as needed for migraine. May repeat in 2 hours if needed 10 tablet 0   . tiZANidine (ZANAFLEX) 4 MG tablet Take 1 tablet (4 mg total) by mouth every 8 (eight) hours as needed for muscle spasms. 21 tablet 0   . Vitamin D, Ergocalciferol, (DRISDOL) 1.25 MG (50000 UT) CAPS capsule Take 1 capsule (50,000 Units total) by mouth every 7 (seven) days. 4 capsule 4   . diclofenac (VOLTAREN) 50 MG EC tablet Take 1 tablet (50 mg total) by mouth 3 (three) times daily. (Patient not taking: Reported on 01/09/2019) 90 tablet 3 Not Taking at Unknown time  . metFORMIN (GLUCOPHAGE) 500 MG tablet Take 1 tab qam x 2 weeks and then increase to 1 tab bid (Patient taking differently: Take 500 mg by mouth 2 (two) times daily with a meal. Take 1 tab qam x 2 weeks and then increase to 1 tab bid) 42 tablet 1     Review of Systems  All other systems reviewed and are negative.   Blood pressure 120/74, pulse 95, temperature 98.1 F (36.7 C), temperature source Oral, resp. rate 16, height 5' 5.5" (1.664 m), weight 88.9 kg, last menstrual period 12/19/2018, SpO2 99 %. Physical Exam  Constitutional: She is oriented to person, place, and time. She appears well-developed and well-nourished.  Cardiovascular: Normal rate and regular rhythm.  Respiratory: Effort normal and breath sounds normal.  Neurological: She is alert and oriented to person, place, and time.  Skin: Skin is warm and dry.  Psychiatric: She has a normal mood and affect.    Results for orders placed or performed during the hospital encounter of 01/12/19 (from the past 24 hour(s))  Pregnancy, urine POC     Status: None   Collection Time: 01/12/19  6:45 AM  Result Value Ref Range   Preg Test, Ur NEGATIVE NEGATIVE    No results found.  Assessment/Plan: 28 yo G2P1 MWF with chronic pelvic pain and with recurrent buttocks abscess here for additional evaluation with diagnostic laparoscopy and I&C with  possible excision of buttocks lesion.  Questions answered.  Pt here and ready to proceed.   Jerene Bears 01/12/2019, 8:19 AM

## 2019-01-12 NOTE — Op Note (Signed)
01/12/2019  10:17 AM  PATIENT:  Jillian Reyes  28 y.o. female  PRE-OPERATIVE DIAGNOSIS:  chronic pelvic pain, right buttocks abscess  POST-OPERATIVE DIAGNOSIS:  chronic pelvic pain, ovarian adhesions, infected sebaceous cyst  PROCEDURE:  Procedure(s): LAPAROSCOPY DIAGNOSTIC; LYSIS OF ADHESIONS Excision of buttocks lesion, 2.0cm in size  SURGEON:  Jerene Bears  ASSISTANTS: Dr. Conley Simmonds   ANESTHESIA:   general  ESTIMATED BLOOD LOSS: 25 mL  BLOOD ADMINISTERED:none   FLUIDS: 1000ccLR  UOP: 50cc clear  SPECIMEN:  none  DISPOSITION OF SPECIMEN:  N/A  FINDINGS: normal upper abdomen, normal appendix, normal liver and gallbladder, normal ovaries except for filmy adhesions that were taken down, normal bladder, normal appearing uterus, normal appearing fallopian tubes.  DESCRIPTION OF OPERATION: Patient is taken to the operating room. She is placed in the supine position. She is a running IV in place. Informed consent was present on the chart. SCDs on her lower extremities and functioning properly. Patient was positioned while she was awake.  Her legs were placed in the low lithotomy position in Henrietta stirrups. Her arms were tucked by the side.  General endotracheal anesthesia was administered by the anesthesia staff without difficulty. Dr. Sampson Goon, anesthesia, oversaw case.  Time out performed.    Dura prep was then used to prep the abdomen and Betadine was used to prep the inner thighs, perineum and vagina. Once 3 minutes had past the patient was draped in a normal standard fashion. The legs were lifted to the high lithotomy position. The cervix was visualized by placing a heavy weighted speculum in the posterior aspect of the vagina and using a curved Deaver retractor to the retract anteriorly. The anterior lip of the cervix was grasped with single-tooth tenaculum.  The cervix sounded to 8 cm. A Hulka clamp tip was passed through the cervical os and into the uterine cavity and  then attached to the anterior lip of the cervix as a means of manipulating the uterus.  The tenaculum was removed. There is also good manipulation of the uterus. The speculum was removed as well. A Foley catheter was placed to straight drain.  Clear urine was noted. Legs were lowered to the low lithotomy position and attention was turned the abdomen.  The umbilicus was everted.  A Veress needle was obtained. Syringe of sterile saline was placed on a open Veress needle.  This was passed into the umbilicus until just when the fluid started to drip.  Then low flow CO2 gas was attached the needle and the pneumoperitoneum was achieved without difficulty. Once four liters of gas was in the abdomen the Veress needle was removed and a 5 millimeter non-bladed Optiview trocar and port were passed directly to the abdomen. The laparoscope was then used to confirm intraperitoneal placement.  Findings are noted above.  Due to her pain, decision made to taken down the adhesions.  Locations for RLQ, LLQ, and suprapubic ports were noted by transillumination of the abdominal wall.  0.25% marcaine was used to anesthetize the skin.  44mm skin incision was made in the RLQ and LLQ and then 70mm nonbladed trochars and ports were placed.  All trochars were removed.    Ureters were identifies.  Attention was turned to the right side.  Using laparoscopic scissors with monopolar attached, the adhesions were cauterized and then incised without difficulty.  This was done in a sminlar fashion on the left side with the adhesions to this ovary.  There was no endometriosis so this portion  of the procedure was ended.  Pneumoperitoneum was relieved.  The patient was taken out of Trendelenburg positioning.  Several deep breaths were given to the patient's trying to any gas the abdomen and finally the midline port was removed.  The skin was then closed with subcuticular stitches of 3-0 Vicryl. The skin was cleansed Dermabond was applied. Attention  was then turned the vagina and the Hulka clamp was removed.  Minimal bleeding was noted.    Legs were then placed in the high lithotomy position so I could visualized the buttocks lesion.  This was opened with a #11 blade.  Purulent material and sebaceous material was noted.  This was removed and culture obtained.  The cyst wall was excised. The cyst wall did not easily come away from underlying tissue.  Therefore silver nitrate was used to cauterize the possible remaining cyst wall.  Then the incision was closed deeply with #2.0 Vicryl and then the incision was close with interrupted #4.0 Vicryl sutures.  Incision was cleansed and dressing applied.  Foley was removed at this point as well.  Sponge, lap, needle, initially counts were correct x2. Patient tolerated the procedure very well. She was awakened from anesthesia, extubated and taken to recovery in stable condition.   COUNTS:  YES  PLAN OF CARE: Transfer to PACU

## 2019-01-12 NOTE — Anesthesia Preprocedure Evaluation (Signed)
Anesthesia Evaluation  Patient identified by MRN, date of birth, ID band Patient awake    Reviewed: Allergy & Precautions, NPO status , Patient's Chart, lab work & pertinent test results  Airway Mallampati: II  TM Distance: >3 FB Neck ROM: Full    Dental  (+) Dental Advisory Given   Pulmonary asthma , Current Smoker and Patient abstained from smoking.,    breath sounds clear to auscultation       Cardiovascular negative cardio ROS   Rhythm:Regular Rate:Normal     Neuro/Psych  Headaches,    GI/Hepatic Neg liver ROS, GERD  ,  Endo/Other  negative endocrine ROS  Renal/GU negative Renal ROS     Musculoskeletal   Abdominal   Peds  Hematology negative hematology ROS (+)   Anesthesia Other Findings   Reproductive/Obstetrics                             Lab Results  Component Value Date   WBC 8.0 01/07/2019   HGB 14.5 01/07/2019   HCT 43.9 01/07/2019   MCV 93.2 01/07/2019   PLT 267 01/07/2019    Anesthesia Physical Anesthesia Plan  ASA: II  Anesthesia Plan: General   Post-op Pain Management:    Induction: Intravenous  PONV Risk Score and Plan: 2 and Dexamethasone, Ondansetron and Treatment may vary due to age or medical condition  Airway Management Planned: Oral ETT  Additional Equipment:   Intra-op Plan:   Post-operative Plan: Extubation in OR  Informed Consent: I have reviewed the patients History and Physical, chart, labs and discussed the procedure including the risks, benefits and alternatives for the proposed anesthesia with the patient or authorized representative who has indicated his/her understanding and acceptance.     Dental advisory given  Plan Discussed with: CRNA  Anesthesia Plan Comments:         Anesthesia Quick Evaluation

## 2019-01-13 ENCOUNTER — Encounter (HOSPITAL_BASED_OUTPATIENT_CLINIC_OR_DEPARTMENT_OTHER): Payer: Self-pay | Admitting: Obstetrics & Gynecology

## 2019-01-14 ENCOUNTER — Encounter: Payer: Self-pay | Admitting: Medical

## 2019-01-14 ENCOUNTER — Telehealth: Payer: Self-pay | Admitting: Obstetrics & Gynecology

## 2019-01-14 ENCOUNTER — Ambulatory Visit (INDEPENDENT_AMBULATORY_CARE_PROVIDER_SITE_OTHER): Payer: Federal, State, Local not specified - PPO | Admitting: Medical

## 2019-01-14 ENCOUNTER — Encounter: Payer: Self-pay | Admitting: Obstetrics & Gynecology

## 2019-01-14 ENCOUNTER — Other Ambulatory Visit: Payer: Self-pay

## 2019-01-14 DIAGNOSIS — J029 Acute pharyngitis, unspecified: Secondary | ICD-10-CM | POA: Diagnosis not present

## 2019-01-14 MED ORDER — AZITHROMYCIN 250 MG PO TABS: ORAL_TABLET | ORAL | 0 refills | Status: DC

## 2019-01-14 NOTE — Progress Notes (Signed)
   Subjective:    Patient ID: Jillian Reyes, female    DOB: 29-Mar-1990, 29 y.o.   MRN: [id number]  HPI  Virtual Visit via Video Note  I connected with Jillian Reyes on 01/14/19 at  2:40 PM EDT by a video enabled telemedicine application and verified that I am speaking with the correct person using two identifiers.  Location: Patient: home  Provider: office   I discussed the limitations of evaluation and management by telemedicine and the availability of in person appointments. The patient expressed understanding and agreed to proceed.   History of Present Illness:   Pt does report day of her surgery after being intubated had st. The next day st was worse and some progressive up until yesterday. Hurts to swallow anything now. Pt has no ha and no neck rigidity. No nausea and no vomiting. Some ear discomfort rt side, rt submandibular node tender and sinus pressure. Worse symptom is throat. No drooling.   Observations/Objective:  General-no acute distress, pleasant, oriented. Lungs- on inspection lungs appear unlabored. Neck- no tracheal deviation or jvd on inspection. Neuro- gross motor function appears intact. Throat- on video visit inspection the throat looks mild swollen with white patch adjacent to rt tonsil pillar area. Difficult to visualize due to excess bright light during part of exam then excess shadow. Airway did look patent.   Later pt sent my chart message. Picture appeared magnified but uvula did look enlarged.     Assessment and Plan:  For recent st and other associated symptoms recommend start azithromycin today. Explained  mild pharyngitis on initial video visit inspection. Later sent  picture on my chart which showed redder posterior pharynx, clear view of white dc and moderate swollen uvula. So I called pt back and explained if not significantly better by tomorrow then recommend ED or UC evaluation. I explained if I had seen her in office then probably would  have given rocephin im but under covid restrictions could not see in office today.  Advised use ibuprofen for pain and warm salt water gargles.  Follow up 7 days or as needed.  Also explained not to work until symptoms improve. Also if st and other symptoms linger then may need covid testing. Follow Up Instructions:    I discussed the assessment and treatment plan with the patient. The patient was provided an opportunity to ask questions and all were answered. The patient agreed with the plan and demonstrated an understanding of the instructions.   The patient was advised to call back or seek an in-person evaluation if the symptoms worsen or if the condition fails to improve as anticipated.  I provided 25 minutes of non-face-to-face time during this encounter.   Mackie Pai, PA-C   Review of Systems  Constitutional: Positive for fatigue. Negative for chills and fever.       Feels warm.  HENT: Positive for ear pain, sinus pressure, sinus pain and sore throat. Negative for congestion.        Very painful swallowing. No drooling.  Cardiovascular: Negative for chest pain.  Musculoskeletal: Negative for back pain, neck pain and neck stiffness.  Hematological: Positive for adenopathy. Does not bruise/bleed easily.       Rt side feels tender.       Objective:   Physical Exam        Assessment & Plan:

## 2019-01-14 NOTE — Patient Instructions (Signed)
For recent st and other associated symptoms recommend start azithromycin today. Explained  mild pharyngitis on initial video visit inspection. Later sent  picture on my chart which showed redder posterior pharynx, clear view of white dc and moderate swollen uvula. So I called pt back and explained if not significantly better by tomorrow then recommend ED or UC evaluation. I explained if I had seen her in office then probably would have given rocephin im but under covid restrictions could not see in office today.  Advised use ibuprofen for pain and warm salt water gargles.  Follow up 7 days or as needed.  Also explained not to work until symptoms improve. Also if st and other symptoms linger then may need covid testing.

## 2019-01-14 NOTE — Telephone Encounter (Signed)
Call to patient. Patient states that her throat became sore yesterday, has worsened today. Unsure if this is the start of strept or if it's irritation from the breathing tube from surgery. States she has been drinking warm tea which helps, but tried to eat string cheese and had to stop because her throat was hurting so bad. Denies fever or chills. Did notice some "whitish spots" on the back of her throat. RN advised patient should contact PCP for follow up of sore throat. Patient agreeable.   Patient also complaining of "heavy bleeding" this am when she woke up, with some cramping. States the bleeding was "clotty" looking describes clots as size of "the tip of my thumb." States the bleeding has slowed down since this morning. Now a normal flow. Denies changing pad/ tampon every 1-2 hours. Advised patient to continue to monitor bleeding. Pain/bleeding precautions reviewed with patient and she verbalized understanding.   Routing to provider for review.

## 2019-01-14 NOTE — Telephone Encounter (Signed)
Patient sent the following message through [place]. Routing to triage to assist patient with request.  Jezebel, [place] [facility]  Phone Number: [phone]        Good morning! Yesterday and today my throat has been bothering me pretty bad. I dont know if its beginning stages of strep or if they accidentally scratched it while i was being put to sleep. Could you please look at the attached picture and let me know what you think?

## 2019-01-15 NOTE — Telephone Encounter (Signed)
Spoke with patient and advised of message below. Patient agreeable to recommendations given. Patient states that her throat is "super sore, but the redness has gone down".

## 2019-01-15 NOTE — Telephone Encounter (Signed)
She has PCOS and does not cycle regularly.  It's not suprising that she started a cycle after the procedure.  Ok to monitor.  Give bleeding precautions.    She should use a bandage as long as there is drainage.  It was infected so I am not surprised by this either.  She should absolutely finish the antibiotics and if there is still significant drainage tomorrow, she needs to call in the morning.  Lastly, how is her throat?  She got antibiotics from her PCP yesterday.  Thanks.

## 2019-01-15 NOTE — Telephone Encounter (Signed)
Spoke with patient regarding the results as seen below. Patient states that she has a lot of drainage at the area on her buttocks. She kept the original bandage on for the first two days then her mother changed the bandage. Would like to know if she should continue to keep the area covered. Patient does not have any itching as of right now with sutures. Per patient was spotting for a couple of days but she is now having heavy bleeding with clots and painful cramps.

## 2019-01-15 NOTE — Telephone Encounter (Signed)
-----   Message from Megan Salon, MD sent at 01/15/2019  6:46 AM EDT ----- Please let pt know I did a skin culture on the place on her buttocks that I excised in the OR.  It is growing a staph bacteria but I put her on bactrim and this should have worked on it.  Can you see how she is feeling?  I did put sutures in it and let her know I may need to remove them next week instead of two weeks from now.  If they get really itchy, she should call next week for suture removal.  Thanks.

## 2019-01-18 LAB — AEROBIC/ANAEROBIC CULTURE W GRAM STAIN (SURGICAL/DEEP WOUND)

## 2019-01-22 ENCOUNTER — Other Ambulatory Visit: Payer: Self-pay

## 2019-01-26 ENCOUNTER — Encounter: Payer: Self-pay | Admitting: Obstetrics & Gynecology

## 2019-01-26 ENCOUNTER — Other Ambulatory Visit: Payer: Self-pay

## 2019-01-26 ENCOUNTER — Ambulatory Visit (INDEPENDENT_AMBULATORY_CARE_PROVIDER_SITE_OTHER): Payer: Federal, State, Local not specified - PPO | Admitting: Obstetrics & Gynecology

## 2019-01-26 VITALS — BP 116/70 | HR 80 | Temp 98.0°F | Resp 12 | Ht 64.0 in | Wt 195.0 lb

## 2019-01-26 DIAGNOSIS — E282 Polycystic ovarian syndrome: Secondary | ICD-10-CM

## 2019-01-26 DIAGNOSIS — G8929 Other chronic pain: Secondary | ICD-10-CM

## 2019-01-26 DIAGNOSIS — L723 Sebaceous cyst: Secondary | ICD-10-CM

## 2019-01-26 DIAGNOSIS — R102 Pelvic and perineal pain: Secondary | ICD-10-CM

## 2019-01-26 MED ORDER — NORETHINDRONE 0.35 MG PO TABS: 1.0000 | ORAL_TABLET | Freq: Every day | ORAL | 3 refills | Status: DC

## 2019-01-26 NOTE — Patient Instructions (Signed)
[facility] [address] [place], [place] [address] [phone]  Dr. Katy Fitch

## 2019-01-26 NOTE — Progress Notes (Signed)
Post Operative Visit  Procedure: LAPAROSCOPY DIAGNOSTIC; LYSIS OF ADHESIONS (N/A Abdomen) INCISION AND DRAINAGE OF LESION (Right Buttocks) Days Post-op: 14  Subjective: Patient states that she has tenderness to touch and when walking on right lower abdomen since this morning.  Denies drainage.  Had post op cramping after surgery.  She had a heavy cycle that was with a lot of clots.  The first cycle she had after initially seeing me was induced with Provera.    Findings with surgery discussed.  Primarily just adhesions noted.  These were lysed.  No evidence of endometriosis noted.  Otherwise visualized pelvic and abdominal organs normal including ovaries and appendix.    Spouse has vasectomy.  D/w pt need for more regular cycles or treatment for flow.  Options reviewed.  Will start micronor.  Does need sutures removed from lesion excision on buttocks  Objective: BP 116/70 (BP Location: Left Arm, Patient Position: Sitting, Cuff Size: Normal)   Pulse 80   Temp 98 F (36.7 C) (Temporal)   Resp 12   Ht 5\' 4"  (1.626 m)   Wt 195 lb (88.5 kg)   LMP 01/19/2019   BMI 33.47 kg/m   EXAM General: alert, cooperative and no distress GI: soft, non-tender; bowel sounds normal; no masses,  no organomegaly and incision: clean, dry and intact Extremities: extremities normal, atraumatic, no cyanosis or edema Vaginal Bleeding: none  Buttocks: sutures removed without difficulty, dressing applied, no erythema or drainage, non tender and no mass noted  Assessment: s/p diagnostic laparoscopy with LOA, excision of sebaceous cyst  Plan: Will start micronor daily Care for buttocks lesion reviewed Recheck 3 months Continue glucophage

## 2019-01-27 ENCOUNTER — Other Ambulatory Visit: Payer: Self-pay | Admitting: Family Medicine

## 2019-01-27 DIAGNOSIS — E785 Hyperlipidemia, unspecified: Secondary | ICD-10-CM

## 2019-01-27 NOTE — Telephone Encounter (Signed)
Requesting:xanax Contract:yes UDS:n/a Last OV:01/14/19 w/ Percell Miller 12/02/18 w/you Next OV:06/04/19 w/you Last Refill:07/07/18  #[id number] Database:   Please advise

## 2019-01-30 ENCOUNTER — Encounter: Payer: Self-pay | Admitting: Internal Medicine

## 2019-01-30 ENCOUNTER — Ambulatory Visit (INDEPENDENT_AMBULATORY_CARE_PROVIDER_SITE_OTHER): Payer: Federal, State, Local not specified - PPO | Admitting: Internal Medicine

## 2019-01-30 ENCOUNTER — Other Ambulatory Visit: Payer: Self-pay

## 2019-01-30 VITALS — BP 101/63 | HR 69 | Temp 97.2°F | Resp 12 | Ht 65.0 in | Wt 192.4 lb

## 2019-01-30 DIAGNOSIS — G43809 Other migraine, not intractable, without status migrainosus: Secondary | ICD-10-CM | POA: Diagnosis not present

## 2019-01-30 DIAGNOSIS — G43819 Other migraine, intractable, without status migrainosus: Secondary | ICD-10-CM | POA: Diagnosis not present

## 2019-01-30 MED ORDER — KETOROLAC TROMETHAMINE 60 MG/2ML IM SOLN
60.0000 mg | Freq: Once | INTRAMUSCULAR | Status: AC
  Administered 2019-01-30: 60 mg via INTRAMUSCULAR

## 2019-01-30 MED ORDER — RIZATRIPTAN BENZOATE 10 MG PO TABS: 10.0000 mg | ORAL_TABLET | ORAL | 0 refills | Status: DC | PRN

## 2019-01-30 NOTE — Progress Notes (Signed)
Subjective:    Patient ID: Jillian Reyes, female    DOB: 1990/12/29, 29 y.o.   MRN: [id number]  DOS:  01/30/2019 Type of visit - description: Acute The patient has a history of migraines and thinks she has a migraine exacerbation. Symptoms started a week ago, headache is steady and progressively worse. Described as pressure. Some light & noise intolerance. No nausea except for today.  No vomiting. She has been using ibuprofen and Excedrin without much success 3 days ago she used her last Maxalt tablet and it helped to some extent.   Review of Systems Denies fever chills Sinuses are slightly congested but at baseline Admits to some stress and not sleeping well lately. Denies myalgias, cough, diarrhea.   Past Medical History:  Diagnosis Date  . Acne   . Asthma due to environmental allergies    per pt moderate, prn inhaler  . Bipolar 1 disorder (HCC)   . Chronic back pain   . Chronic pelvic pain in female   . Dyslipidemia 02/18/2013  . Eczema   . GAD (generalized anxiety disorder)   . GERD (gastroesophageal reflux disease)   . History of abnormal cervical Pap smear   . History of chlamydia 2015  . History of gestational diabetes   . Irregular periods/menstrual cycles   . Migraines   . PCOS (polycystic ovarian syndrome)   . Vitamin D deficiency 04/10/2015  . Wears glasses     Past Surgical History:  Procedure Laterality Date  . INCISION AND DRAINAGE Right 01/12/2019   Procedure: INCISION AND DRAINAGE OF LESION;  Surgeon: Jerene Bears, MD;  Location: [facility];  Service: Gynecology;  Laterality: Right;  . LAPAROSCOPY N/A 01/12/2019   Procedure: LAPAROSCOPY DIAGNOSTIC; LYSIS OF ADHESIONS;  Surgeon: Jerene Bears, MD;  Location: [facility];  Service: Gynecology;  Laterality: N/A;  possible treatement of endometriosis  . NO PAST SURGERIES    . WISDOM TOOTH EXTRACTION  2016    Social History   Socioeconomic History  . Marital  status: Married    Spouse name: Not on file  . Number of children: 1  . Years of education: Not on file  . Highest education level: Not on file  Occupational History  . Occupation: clerck     Comment: [employer]  Social Needs  . Financial resource strain: Not on file  . Food insecurity    Worry: Not on file    Inability: Not on file  . Transportation needs    Medical: Not on file    Non-medical: Not on file  Tobacco Use  . Smoking status: Current Every Day Smoker    Years: 13.00    Types: Cigarettes  . Smokeless tobacco: Never Used  . Tobacco comment: 01-09-2019  per pt currently 1-2 cig per day down from 1/2ppd  Substance and Sexual Activity  . Alcohol use: Not Currently  . Drug use: Never  . Sexual activity: Yes    Partners: Male    Birth control/protection: None    Comment: vasectomy   Lifestyle  . Physical activity    Days per week: Not on file    Minutes per session: Not on file  . Stress: Not on file  Relationships  . Social Musician on phone: Not on file    Gets together: Not on file    Attends religious service: Not on file    Active member of club or organization: Not on file  Attends meetings of clubs or organizations: Not on file    Relationship status: Not on file  . Intimate partner violence    Fear of current or ex partner: Not on file    Emotionally abused: Not on file    Physically abused: Not on file    Forced sexual activity: Not on file  Other Topics Concern  . Not on file  Social History Narrative   Lives home with husband,  4 children.  Works for [employer]. Education college.      Allergies as of 01/30/2019      Reactions   Cymbalta [duloxetine Hcl] Nausea And Vomiting      Medication List       Accurate as of January 30, 2019  1:38 PM. If you have any questions, ask your nurse or doctor.        acetaminophen 500 MG tablet Commonly known as: TYLENOL Take 500 mg by mouth every 6 (six) hours as needed.   albuterol 108 (90  Base) MCG/ACT inhaler Commonly known as: VENTOLIN HFA Inhale 2 puffs into the lungs every 6 (six) hours as needed for wheezing or shortness of breath.   alprazolam 2 MG tablet Commonly known as: XANAX TAKE 1 TABLET (2 MG TOTAL) BY MOUTH AT BEDTIME AS NEEDED. FOR SLEEP.   aspirin-acetaminophen-caffeine 250-250-65 MG tablet Commonly known as: EXCEDRIN MIGRAINE Take by mouth every 6 (six) hours as needed for headache.   calcium carbonate 500 MG chewable tablet Commonly known as: TUMS - dosed in mg elemental calcium Chew 1 tablet by mouth as needed for indigestion or heartburn.   clobetasol cream 0.05 % Commonly known as: TEMOVATE APPLY TOPICALLY DAILY AS NEEDED. ECZEMA   diclofenac 50 MG EC tablet Commonly known as: VOLTAREN Take 1 tablet (50 mg total) by mouth 3 (three) times daily.   Erenumab-aooe 140 MG/ML Soaj Commonly known as: Aimovig Inject 140 mg into the skin every 30 (thirty) days.   fluticasone 50 MCG/ACT nasal spray Commonly known as: FLONASE Place 1 spray into both nostrils 2 (two) times daily.   ibuprofen 800 MG tablet Commonly known as: ADVIL Take 1 tablet (800 mg total) by mouth every 8 (eight) hours as needed.   lamoTRIgine 150 MG tablet Commonly known as: LAMICTAL Take 1 tablet (150 mg total) by mouth daily. What changed: when to take this   metFORMIN 500 MG tablet Commonly known as: Glucophage Take 1 tab qam x 2 weeks and then increase to 1 tab bid What changed:   how much to take  how to take this  when to take this   norethindrone 0.35 MG tablet Commonly known as: MICRONOR Take 1 tablet (0.35 mg total) by mouth daily.   propranolol 20 MG tablet Commonly known as: INDERAL TAKE 1-2 TABLETS (20-40 MG TOTAL) BY MOUTH 3 (THREE) TIMES DAILY.   rizatriptan 10 MG tablet Commonly known as: Maxalt Take 1 tablet (10 mg total) by mouth as needed for migraine. May repeat in 2 hours if needed   tiZANidine 4 MG tablet Commonly known as: Zanaflex  Take 1 tablet (4 mg total) by mouth every 8 (eight) hours as needed for muscle spasms.   Vitamin D (Ergocalciferol) 1.25 MG (50000 UT) Caps capsule Commonly known as: DRISDOL Take 1 capsule (50,000 Units total) by mouth every 7 (seven) days.   Vitamin D3 50 MCG (2000 UT) Tabs Take by mouth daily.           Objective:   Physical Exam BP  101/63 (BP Location: Right Arm, Cuff Size: Normal)   Pulse 69   Temp (!) 97.2 F (36.2 C) (Temporal)   Resp 12   Ht 5\' 5"  (1.651 m)   Wt 192 lb 6.4 oz (87.3 kg)   LMP 01/19/2019   SpO2 100%   BMI 32.02 kg/m  General:   Well developed, NAD, BMI noted. HEENT:  Normocephalic . Face symmetric, atraumatic. Neck: No TTP, range of motion normal Lungs:  CTA B Normal respiratory effort, no intercostal retractions, no accessory muscle use. Heart: RRR,  no murmur.  No pretibial edema bilaterally  Skin: Not pale. Not jaundice Neurologic:  alert & oriented X3.  Speech normal, gait appropriate for age and unassisted EOMI, pupils equal and reactive, no photophobia noted Psych--  Cognition and judgment appear intact.  Cooperative with normal attention span and concentration.  Behavior appropriate. No anxious or depressed appearing.      Assessment    28 year old female, PMH includes PCOS, migraines, gestational diabetes, bipolar disorder, asthma, anxiety, on birth control pills presents with  Migraines: Headaches started a week ago, consistent with previous history of migraines, no red flag symptoms such as fever, chills, atypical symptoms, is not a "worst of life" headache. Plan: Increase hydration, try to sleep longer hours, OTC Benadryl okay. Toradol 60 mg IM x1 Refill Maxalt Continue Inderal and Aimovig Call or ER if severe or unusual symptoms or not improving

## 2019-01-30 NOTE — Patient Instructions (Signed)
Rest  Drink plenty fluids  If you cannot sleep, take over-the-counter Tylenol PM  Maxalt 1 tablet with a headache, may repeat in 2 hours.  No more than 2 tablets in a 24-hour..  Try to minimize the use of Excedrin and ibuprofen  If you are not gradually improving, please call  If you have any unusual headache or severe headache or the worst headache of your life: Go to the ER

## 2019-02-26 ENCOUNTER — Encounter: Payer: Self-pay | Admitting: Obstetrics & Gynecology

## 2019-02-26 ENCOUNTER — Telehealth: Payer: Self-pay | Admitting: Obstetrics & Gynecology

## 2019-02-26 NOTE — Telephone Encounter (Signed)
Left message to call Charli Liberatore, RN at [facility] [phone].   

## 2019-02-26 NOTE — Telephone Encounter (Signed)
Patient sent the following message through [place]. Routing to triage to assist patient with request.  Adeja, [place] [facility]  Phone Number: [phone]  Hey there! Hope all is well! I just have a a couple questions.   I have had my period like every 2 weeks is that normal?   I just started today again and its brown? Is that normal?

## 2019-02-26 NOTE — Telephone Encounter (Signed)
Patient returned call

## 2019-02-26 NOTE — Telephone Encounter (Addendum)
Spoke with patient. Reports menses q2 wks. Started again on 02/26/19. Started POP and metformin on 01/27/19 for PCOS symptoms. Spouse vasectomy. Does not take POP roughly same time daily, has missed a couple of pills. Reports hx of BV after menses. Bleeding that started on 12/10is darker than normal and more like a vaginal d/c with odor. She had a previous Rx for flagyl 500 mg, started taking this yesterday, has 2 days of medication.  Asking if bleeding is normal?  Continue flagyl? Will need new rx.   1. Advised patient can take 3 months for menses to adjust to new contraceptive. Is important to take roughly same time daily, no missed pills. May or may not have menses with POP. Missed or late pills can cause irregular menses.   2. Advised BV symptoms typically require OV for further evaluation. Will have to review with Dr. Sabra Heck since you have already started an abx. Will review and return call with recommendations.   Patient is at work, request detailed message if no answer. Confirmed pharmacy on file.   Dr. Sabra Heck -please advise.

## 2019-02-26 NOTE — Telephone Encounter (Signed)
Left message to call Field Staniszewski, RN at [facility] [phone].   

## 2019-02-27 MED ORDER — METRONIDAZOLE 500 MG PO TABS: 500.0000 mg | ORAL_TABLET | Freq: Two times a day (BID) | ORAL | 0 refills | Status: DC

## 2019-02-27 NOTE — Telephone Encounter (Signed)
Ok to send in rx for flagyl 500mg  bid x 7 days.  In the future, I'd prefer to test for BV before just presumptively treating it.  The bleeding is not "normal" but we have done a thorough evaluation this year so I think she needs to consider options to control her bleeding.  IUD would be a safe consideration.  It is ok to stop the pills now if she would like as well.

## 2019-02-27 NOTE — Telephone Encounter (Signed)
Call to patient, left detailed message, per patients previous request. Advised per Dr. Sabra Heck. Rx for flagyl to [facility] in [place]. Advised patient to return call to office if she would like to proceed with IUD insertion or consult.   Encounter closed.

## 2019-03-09 ENCOUNTER — Other Ambulatory Visit: Payer: Self-pay | Admitting: Obstetrics & Gynecology

## 2019-03-09 NOTE — Telephone Encounter (Signed)
Medication refill request: Glucophage Last AEX:  12-09-2018 SM  Next OV: 05-01-19 Last MMG (if hormonal medication request): n/a Refill authorized: Today, please advise.   Medication pended for #42, 1RF. Please refill if appropriate.

## 2019-03-25 ENCOUNTER — Ambulatory Visit: Payer: Self-pay

## 2019-03-25 ENCOUNTER — Other Ambulatory Visit: Payer: Self-pay

## 2019-03-25 ENCOUNTER — Ambulatory Visit: Payer: Federal, State, Local not specified - PPO | Admitting: Orthopaedic Surgery

## 2019-03-25 ENCOUNTER — Encounter: Payer: Self-pay | Admitting: Orthopaedic Surgery

## 2019-03-25 VITALS — Ht 65.0 in | Wt 192.0 lb

## 2019-03-25 DIAGNOSIS — M25561 Pain in right knee: Secondary | ICD-10-CM | POA: Diagnosis not present

## 2019-03-25 DIAGNOSIS — G8929 Other chronic pain: Secondary | ICD-10-CM
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
## 2019-04-08 ENCOUNTER — Other Ambulatory Visit: Payer: Self-pay

## 2019-04-08 ENCOUNTER — Ambulatory Visit
Admission: RE | Admit: 2019-04-08 | Discharge: 2019-04-08 | Disposition: A | Discharge status: Home / Self Care | Payer: Federal, State, Local not specified - PPO | Source: Ambulatory Visit | Attending: Orthopaedic Surgery | Admitting: Orthopaedic Surgery

## 2019-04-08 DIAGNOSIS — G8929 Other chronic pain: Secondary | ICD-10-CM

## 2019-04-08 DIAGNOSIS — M25561 Pain in right knee: Secondary | ICD-10-CM | POA: Diagnosis not present

## 2019-04-09 ENCOUNTER — Telehealth: Payer: Self-pay | Admitting: Orthopaedic Surgery

## 2019-04-09 ENCOUNTER — Encounter: Payer: Self-pay | Admitting: Orthopaedic Surgery

## 2019-04-09 ENCOUNTER — Ambulatory Visit: Payer: Federal, State, Local not specified - PPO | Admitting: Orthopaedic Surgery

## 2019-04-09 DIAGNOSIS — M25861 Other specified joint disorders, right knee: Secondary | ICD-10-CM | POA: Diagnosis not present

## 2019-04-09 DIAGNOSIS — M25561 Pain in right knee: Secondary | ICD-10-CM | POA: Diagnosis not present

## 2019-04-09 DIAGNOSIS — G8929 Other chronic pain: Secondary | ICD-10-CM
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
## 2019-04-09 NOTE — Telephone Encounter (Signed)
At check out patient requested a note taking her out of work from her surgery date 04-16-2019 and to return to work on 04-27-2019.   [phone]

## 2019-04-10 NOTE — Telephone Encounter (Signed)
Patient aware note ready for her where she can print off of her mychart

## 2019-04-16 ENCOUNTER — Other Ambulatory Visit: Payer: Self-pay | Admitting: Orthopaedic Surgery

## 2019-04-16 DIAGNOSIS — M65861 Other synovitis and tenosynovitis, right lower leg: Secondary | ICD-10-CM | POA: Diagnosis not present

## 2019-04-16 DIAGNOSIS — M794 Hypertrophy of (infrapatellar) fat pad: Secondary | ICD-10-CM

## 2019-04-16 DIAGNOSIS — M67361 Transient synovitis, right knee: Secondary | ICD-10-CM

## 2019-04-16 DIAGNOSIS — E8889 Other specified metabolic disorders: Secondary | ICD-10-CM

## 2019-04-16 DIAGNOSIS — M659 Synovitis and tenosynovitis, unspecified: Secondary | ICD-10-CM | POA: Diagnosis not present

## 2019-04-16 MED ORDER — HYDROCODONE-ACETAMINOPHEN 5-325 MG PO TABS: 1.0000 | ORAL_TABLET | Freq: Four times a day (QID) | ORAL | 0 refills | Status: DC | PRN

## 2019-04-16 NOTE — Progress Notes (Unsigned)
norco

## 2019-04-21 ENCOUNTER — Telehealth: Payer: Self-pay

## 2019-04-21 ENCOUNTER — Other Ambulatory Visit: Payer: Self-pay

## 2019-04-21 ENCOUNTER — Ambulatory Visit (HOSPITAL_COMMUNITY)
Admission: RE | Admit: 2019-04-21 | Discharge: 2019-04-21 | Disposition: A | Discharge status: Home / Self Care | Payer: Federal, State, Local not specified - PPO | Source: Ambulatory Visit | Attending: Orthopaedic Surgery | Admitting: Orthopaedic Surgery

## 2019-04-21 DIAGNOSIS — M7989 Other specified soft tissue disorders: Secondary | ICD-10-CM | POA: Insufficient documentation

## 2019-04-21 DIAGNOSIS — M79604 Pain in right leg: Secondary | ICD-10-CM

## 2019-04-21 NOTE — Telephone Encounter (Signed)
I would have her take a full-strength 325 mg aspirin daily.  Also, she needs a Doppler ultrasound to rule out a DVT.

## 2019-04-21 NOTE — Progress Notes (Signed)
Right lower extremity venous duplex completed. Refer to "CV Proc" under chart review to view preliminary results.  04/21/2019 2:55 PM Eula Fried., MHA, RVT, RDCS, RDMS

## 2019-04-21 NOTE — Telephone Encounter (Signed)
Please advise 

## 2019-04-21 NOTE — Telephone Encounter (Signed)
Patient called stating that she has been having calf pain for the last couple of days and right calf is a little swollen.  Stated that she has not been using her crutches and doesn't know if that would have anything to do with it.  Patient had knee surgery on Thursday, 04/16/2019.  Cb# [phone].  Please advise.  Thank you.

## 2019-04-21 NOTE — Telephone Encounter (Signed)
Patient aware doppler is scheduled today at 2:00 at [facility]

## 2019-04-21 NOTE — Telephone Encounter (Signed)
Jillian Reyes Sessions with Vascular Lab at [facility] wanted to let Dr. Magnus Ivan know that patient is Negative for DVT right leg.  Any questions please call [phone].  Please advise.  Thank you.

## 2019-04-22 ENCOUNTER — Other Ambulatory Visit: Payer: Self-pay

## 2019-04-22 ENCOUNTER — Ambulatory Visit (INDEPENDENT_AMBULATORY_CARE_PROVIDER_SITE_OTHER): Payer: Federal, State, Local not specified - PPO | Admitting: Orthopaedic Surgery

## 2019-04-22 ENCOUNTER — Encounter: Payer: Self-pay | Admitting: Orthopaedic Surgery

## 2019-04-22 DIAGNOSIS — Z9889 Other specified postprocedural states: Secondary | ICD-10-CM

## 2019-04-22 MED ORDER — HYDROCODONE-ACETAMINOPHEN 5-325 MG PO TABS: 1.0000 | ORAL_TABLET | Freq: Four times a day (QID) | ORAL | 0 refills | Status: DC | PRN

## 2019-04-22 NOTE — Progress Notes (Signed)
The patient is 1 week out from a right knee arthroscopy.  She has significant impingement from Hoffa's fat pad and interstitial tearing of the patella tendon near its insertion of the patella.  This is become a chronic issue for her.  I did share with her arthroscopy pictures.  I showed her that the remainder of the structures in her knee all look good.  Her ACL and PCL were intact.  There is no cartilage changes on any compartment of her knee.  The medial lateral meniscus also look great.  On exam today she does have a moderate effusion and I did drain about 20 to 25 cc of bloody fluid from the knee.  I then placed a steroid injection in her right knee.  We talked in detail about her knee.  I would like to send her to outpatient physical therapy.  She will continue her knee sleeve as well.  Hopefully therapy will help get her knee more stable feeling and can help improve the function of her knee in general.  All question concerns were answered addressed.  I will keep her on just light duty sedentary work for the next 4 to 6 weeks.  I gave her a note reflecting this.  I would like to see her back in 4 weeks for repeat exam.  I did send in some more hydrocodone for her as well.

## 2019-04-29 ENCOUNTER — Encounter: Payer: Self-pay | Admitting: Orthopaedic Surgery

## 2019-04-29 ENCOUNTER — Telehealth: Payer: Self-pay | Admitting: Obstetrics & Gynecology

## 2019-04-29 NOTE — Telephone Encounter (Signed)
Patient canceled her 3 month follow up appointment 05/01/19.She will call later to reschedule.

## 2019-05-01 ENCOUNTER — Ambulatory Visit: Payer: Federal, State, Local not specified - PPO | Admitting: Obstetrics & Gynecology

## 2019-05-04 ENCOUNTER — Ambulatory Visit: Payer: Federal, State, Local not specified - PPO | Attending: Orthopaedic Surgery | Admitting: Physical Therapy

## 2019-05-04 ENCOUNTER — Encounter: Payer: Self-pay | Admitting: Physical Therapy

## 2019-05-04 ENCOUNTER — Other Ambulatory Visit: Payer: Self-pay

## 2019-05-04 DIAGNOSIS — R2689 Other abnormalities of gait and mobility: Secondary | ICD-10-CM | POA: Insufficient documentation

## 2019-05-04 DIAGNOSIS — M6281 Muscle weakness (generalized): Secondary | ICD-10-CM | POA: Insufficient documentation

## 2019-05-04 DIAGNOSIS — M25661 Stiffness of right knee, not elsewhere classified: Secondary | ICD-10-CM | POA: Insufficient documentation

## 2019-05-04 DIAGNOSIS — M25561 Pain in right knee: Secondary | ICD-10-CM | POA: Diagnosis not present

## 2019-05-04 NOTE — Patient Instructions (Signed)
Access Code: [email or web address]  URL: [email or web address]  Date: 05/04/2019  Prepared by: Roderic Scarce   Exercises Supine Ankle Pumps - 10-15 reps - 3x daily - 7x weekly Supine Quadricep Sets - 10-15 reps - 3x daily - 7x weekly Supine Active Straight Leg Raise - 10-15 reps - 3x daily - 7x weekly Supine Short Arc Quad - 10-15 reps - 3x daily - 7x weekly Supine Heel Slides - 10-15 reps - 3x daily - 7x weekly Seated Long Arc Quad - 10-15 reps - 3x daily - 7x weekly Seated Knee Flexion Stretch - 15 reps - 3 sets - 1x daily - 7x weekly
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
## 2019-05-07 ENCOUNTER — Ambulatory Visit: Payer: Federal, State, Local not specified - PPO | Admitting: Physical Therapy

## 2019-05-08 ENCOUNTER — Ambulatory Visit: Payer: Federal, State, Local not specified - PPO | Admitting: Physical Therapy

## 2019-05-11 ENCOUNTER — Encounter: Payer: Federal, State, Local not specified - PPO | Admitting: Physical Therapy

## 2019-05-20 ENCOUNTER — Encounter: Payer: Self-pay | Admitting: Orthopaedic Surgery

## 2019-05-20 ENCOUNTER — Other Ambulatory Visit: Payer: Self-pay

## 2019-05-20 ENCOUNTER — Ambulatory Visit (INDEPENDENT_AMBULATORY_CARE_PROVIDER_SITE_OTHER): Payer: Federal, State, Local not specified - PPO | Admitting: Orthopaedic Surgery

## 2019-05-20 DIAGNOSIS — M25561 Pain in right knee: Secondary | ICD-10-CM

## 2019-05-20 DIAGNOSIS — Z9889 Other specified postprocedural states: Secondary | ICD-10-CM

## 2019-05-20 MED ORDER — LIDOCAINE HCL 1 % IJ SOLN
3.0000 mL | INTRAMUSCULAR | Status: AC | PRN
  Administered 2019-05-20: 3 mL

## 2019-05-20 MED ORDER — METHYLPREDNISOLONE ACETATE 40 MG/ML IJ SUSP
40.0000 mg | INTRAMUSCULAR | Status: AC | PRN
  Administered 2019-05-20: 40 mg via INTRA_ARTICULAR

## 2019-05-20 NOTE — Progress Notes (Signed)
   Procedure Note  Patient: Jillian Reyes             Date of Birth: 07/05/1990           MRN: [id number]             Visit Date: 05/20/2019  Procedures: Visit Diagnoses:  1. Status post arthroscopy of right knee     Large Joint Inj on 05/20/2019 9:21 AM Indications: diagnostic evaluation and pain Details: 22 G 1.5 in needle, superolateral approach  Arthrogram: No  Medications: 3 mL lidocaine 1 %; 40 mg methylPREDNISolone acetate 40 MG/ML Outcome: tolerated well, no immediate complications Procedure, treatment alternatives, risks and benefits explained, specific risks discussed. Consent was given by the patient. Immediately prior to procedure a time out was called to verify the correct patient, procedure, equipment, support staff and site/side marked as required. Patient was prepped and draped in the usual sterile fashion.    The patient is about 5 weeks out from a right knee arthroscopy.  She is only been the one therapy session because unfortunately there was a family issue with a death in the family.  She is someone we did perform an arthroscopic intervention of the knee due to chronic impingement of Hoffa's fat pad.  On exam today she does still have moderate swelling of her right knee.  Her left knee hyperextends and has no swelling.  It seems to compare these knees.  The right knee is slightly warm but there is no evidence of infection.  I was able to aspirate about 30 cc of fluid from the knee.  I then placed a steroid injection trying to hopefully calm down the inflammation and swelling of the knee.  We will still keep her on light duty work with mainly sedentary work.  All questions and concerns were answered addressed.  We will see her back in 4 weeks to see how she is doing overall clinically.

## 2019-05-26 ENCOUNTER — Ambulatory Visit (INDEPENDENT_AMBULATORY_CARE_PROVIDER_SITE_OTHER): Payer: Federal, State, Local not specified - PPO | Admitting: Family Medicine

## 2019-05-26 ENCOUNTER — Other Ambulatory Visit: Payer: Self-pay

## 2019-05-26 ENCOUNTER — Encounter: Payer: Self-pay | Admitting: Family Medicine

## 2019-05-26 VITALS — HR 122

## 2019-05-26 DIAGNOSIS — F319 Bipolar disorder, unspecified: Secondary | ICD-10-CM | POA: Diagnosis not present

## 2019-05-26 DIAGNOSIS — L709 Acne, unspecified: Secondary | ICD-10-CM | POA: Diagnosis not present

## 2019-05-26 DIAGNOSIS — F4321 Adjustment disorder with depressed mood: Secondary | ICD-10-CM | POA: Diagnosis not present

## 2019-05-26 DIAGNOSIS — G4700 Insomnia, unspecified: Secondary | ICD-10-CM

## 2019-05-26 DIAGNOSIS — F411 Generalized anxiety disorder: Secondary | ICD-10-CM

## 2019-05-26 DIAGNOSIS — L509 Urticaria, unspecified: Secondary | ICD-10-CM

## 2019-05-26 DIAGNOSIS — F430 Acute stress reaction: Secondary | ICD-10-CM

## 2019-05-26 MED ORDER — LAMOTRIGINE 200 MG PO TABS: 200.0000 mg | ORAL_TABLET | Freq: Every day | ORAL | 1 refills | Status: DC

## 2019-05-26 MED ORDER — TEMAZEPAM 15 MG PO CAPS: 15.0000 mg | ORAL_CAPSULE | Freq: Every evening | ORAL | 0 refills | Status: DC | PRN

## 2019-05-27 DIAGNOSIS — F4321 Adjustment disorder with depressed mood: Secondary | ICD-10-CM | POA: Insufficient documentation

## 2019-05-27 DIAGNOSIS — F4320 Adjustment disorder, unspecified: Secondary | ICD-10-CM | POA: Insufficient documentation

## 2019-05-27 NOTE — Progress Notes (Signed)
Patient ID: Jillian Reyes, female   DOB: [birth year], 29 y.o.   MRN: [id number] Virtual Visit via Video Note  I connected with Jillian Reyes on 05/26/19 at  4:00 PM EST by a video enabled telemedicine application and verified that I am speaking with the correct person using two identifiers.  Location: Patient: home Provider: office   I discussed the limitations of evaluation and management by telemedicine and the availability of in person appointments. The patient expressed understanding and agreed to proceed. Thelma Barge, CMA was able to get the patient set up on a video visit.    Subjective:    Patient ID: Jillian Reyes, female    DOB: 11-12-1990, 29 y.o.   MRN: [id number]  Chief Complaint  Patient presents with  . grief  . Medication Management    lamictal    HPI Patient is in today for evaluation of a severe grief reaction. Her 7 year old sister has just died of a likely drug overdose. The patient herself is very sad, cries easily, is unable to concentrate, easily agitated. She tries to stay strong for her 33 year old son whom she notes looks a good bit like her sister but then when she is alone she just cries. She has gone back to work as she thinks being distracted would help her but she is having trouble with concentrating. She is not sleeping well. No suicidal ideation but she is noting significant anhedonia. Denies CP/palp/SOB/HA/congestion/fevers/GI or GU c/o. Taking meds as prescribed  Past Medical History:  Diagnosis Date  . Acne   . Asthma due to environmental allergies    per pt moderate, prn inhaler  . Bipolar 1 disorder (HCC)   . Chronic back pain   . Chronic pelvic pain in female   . Dyslipidemia 02/18/2013  . Eczema   . GAD (generalized anxiety disorder)   . GERD (gastroesophageal reflux disease)   . History of abnormal cervical Pap smear   . History of chlamydia 2015  . History of gestational diabetes   . Irregular periods/menstrual cycles   .  Migraines   . PCOS (polycystic ovarian syndrome)   . Vitamin D deficiency 04/10/2015  . Wears glasses     Past Surgical History:  Procedure Laterality Date  . INCISION AND DRAINAGE Right 01/12/2019   Procedure: INCISION AND DRAINAGE OF LESION;  Surgeon: Jerene Bears, MD;  Location: [facility];  Service: Gynecology;  Laterality: Right;  . LAPAROSCOPY N/A 01/12/2019   Procedure: LAPAROSCOPY DIAGNOSTIC; LYSIS OF ADHESIONS;  Surgeon: Jerene Bears, MD;  Location: [facility];  Service: Gynecology;  Laterality: N/A;  possible treatement of endometriosis  . NO PAST SURGERIES    . WISDOM TOOTH EXTRACTION  2016    Family History  Problem Relation Age of Onset  . Heart disease Other   . Diabetes Father 50       type 2  . Thyroid disease Father   . Brain cancer Father   . Cancer Father        tumor of the brain cancer  . Migraines Mother   . Kidney disease Mother   . Colon polyps Mother   . Hyperlipidemia Other        great grandparents    Social History   Socioeconomic History  . Marital status: Married    Spouse name: Not on file  . Number of children: 1  . Years of education: Not on file  . Highest  education level: Not on file  Occupational History  . Occupation: clerck     Comment: [employer]  Tobacco Use  . Smoking status: Current Every Day Smoker    Years: 13.00    Types: Cigarettes  . Smokeless tobacco: Never Used  . Tobacco comment: 01-09-2019  per pt currently 1-2 cig per day down from 1/2ppd  Substance and Sexual Activity  . Alcohol use: Not Currently  . Drug use: Never  . Sexual activity: Yes    Partners: Male    Birth control/protection: None    Comment: vasectomy   Other Topics Concern  . Not on file  Social History Narrative   Lives home with husband,  4 children.  Works for [employer]. Education college.   Social Determinants of Health   Financial Resource Strain:   . Difficulty of Paying Living Expenses: Not on file  Food  Insecurity:   . Worried About Programme researcher, broadcasting/film/videounning Out of Food in the Last Year: Not on file  . Ran Out of Food in the Last Year: Not on file  Transportation Needs:   . Lack of Transportation (Medical): Not on file  . Lack of Transportation (Non-Medical): Not on file  Physical Activity:   . Days of Exercise per Week: Not on file  . Minutes of Exercise per Session: Not on file  Stress:   . Feeling of Stress : Not on file  Social Connections:   . Frequency of Communication with Friends and Family: Not on file  . Frequency of Social Gatherings with Friends and Family: Not on file  . Attends Religious Services: Not on file  . Active Member of Clubs or Organizations: Not on file  . Attends BankerClub or Organization Meetings: Not on file  . Marital Status: Not on file  Intimate Partner Violence:   . Fear of Current or Ex-Partner: Not on file  . Emotionally Abused: Not on file  . Physically Abused: Not on file  . Sexually Abused: Not on file    Outpatient Medications Prior to Visit  Medication Sig Dispense Refill  . acetaminophen (TYLENOL) 500 MG tablet Take 500 mg by mouth every 6 (six) hours as needed.    Marland Kitchen. albuterol (VENTOLIN HFA) 108 (90 Base) MCG/ACT inhaler Inhale 2 puffs into the lungs every 6 (six) hours as needed for wheezing or shortness of breath. 18 g 2  . alprazolam (XANAX) 2 MG tablet TAKE 1 TABLET (2 MG TOTAL) BY MOUTH AT BEDTIME AS NEEDED. FOR SLEEP. 30 tablet 5  . aspirin-acetaminophen-caffeine (EXCEDRIN MIGRAINE) 250-250-65 MG tablet Take by mouth every 6 (six) hours as needed for headache.    . Cholecalciferol (VITAMIN D3) 50 MCG (2000 UT) TABS Take by mouth daily.    . clobetasol cream (TEMOVATE) 0.05 % APPLY TOPICALLY DAILY AS NEEDED. ECZEMA 60 g 3  . fluticasone (FLONASE) 50 MCG/ACT nasal spray Place 1 spray into both nostrils 2 (two) times daily.     . metFORMIN (GLUCOPHAGE) 500 MG tablet Take 1 tablet (500 mg total) by mouth 2 (two) times daily with a meal. TAKE 1 TAB IN THE MORNING  FOR 2 WEEKS, THEN INCREASE TO 1 TAB TWICE DAILY 60 tablet 12  . norethindrone (MICRONOR) 0.35 MG tablet Take 1 tablet (0.35 mg total) by mouth daily. 3 Package 3  . propranolol (INDERAL) 20 MG tablet TAKE 1-2 TABLETS (20-40 MG TOTAL) BY MOUTH 3 (THREE) TIMES DAILY. (Patient taking differently: Take 20-40 mg by mouth 3 (three) times daily. ) 180 tablet 2  .  rizatriptan (MAXALT) 10 MG tablet Take 1 tablet (10 mg total) by mouth as needed for migraine. May repeat in 2 hours if needed 20 tablet 0  . lamoTRIgine (LAMICTAL) 150 MG tablet Take 1 tablet (150 mg total) by mouth daily. (Patient taking differently: Take 150 mg by mouth every evening. ) 30 tablet 5  . calcium carbonate (TUMS - DOSED IN MG ELEMENTAL CALCIUM) 500 MG chewable tablet Chew 1 tablet by mouth as needed for indigestion or heartburn.    . diclofenac (VOLTAREN) 50 MG EC tablet Take 1 tablet (50 mg total) by mouth 3 (three) times daily. 90 tablet 3  . Erenumab-aooe (AIMOVIG) 140 MG/ML SOAJ Inject 140 mg into the skin every 30 (thirty) days. 1 pen 11  . HYDROcodone-acetaminophen (NORCO/VICODIN) 5-325 MG tablet Take 1-2 tablets by mouth every 6 (six) hours as needed for moderate pain. 30 tablet 0  . ibuprofen (ADVIL) 800 MG tablet Take 1 tablet (800 mg total) by mouth every 8 (eight) hours as needed. 30 tablet 0  . metroNIDAZOLE (FLAGYL) 500 MG tablet Take 1 tablet (500 mg total) by mouth 2 (two) times daily. 14 tablet 0  . tiZANidine (ZANAFLEX) 4 MG tablet Take 1 tablet (4 mg total) by mouth every 8 (eight) hours as needed for muscle spasms. 21 tablet 0  . Vitamin D, Ergocalciferol, (DRISDOL) 1.25 MG (50000 UT) CAPS capsule Take 1 capsule (50,000 Units total) by mouth every 7 (seven) days. 4 capsule 4   No facility-administered medications prior to visit.    Allergies  Allergen Reactions  . Cymbalta [Duloxetine Hcl] Nausea And Vomiting    Review of Systems  Constitutional: Positive for malaise/fatigue. Negative for fever.  HENT:  Negative for congestion.   Eyes: Negative for blurred vision.  Respiratory: Negative for shortness of breath.   Cardiovascular: Negative for chest pain, palpitations and leg swelling.  Gastrointestinal: Negative for abdominal pain, blood in stool and nausea.  Genitourinary: Negative for dysuria and frequency.  Musculoskeletal: Negative for falls.  Skin: Negative for rash.  Neurological: Negative for dizziness, loss of consciousness and headaches.  Endo/Heme/Allergies: Negative for environmental allergies.  Psychiatric/Behavioral: Positive for depression. Negative for hallucinations, memory loss, substance abuse and suicidal ideas. The patient is nervous/anxious and has insomnia.        Objective:    Physical Exam Constitutional:      Appearance: Normal appearance. She is not ill-appearing.  HENT:     Head: Normocephalic and atraumatic.     Nose: Nose normal.  Eyes:     General:        Right eye: No discharge.        Left eye: No discharge.  Pulmonary:     Effort: Pulmonary effort is normal.  Neurological:     Mental Status: She is alert and oriented to person, place, and time.  Psychiatric:        Behavior: Behavior normal.     Comments: Very tearful     Pulse (!) 122  Wt Readings from Last 3 Encounters:  03/25/19 192 lb (87.1 kg)  01/30/19 192 lb 6.4 oz (87.3 kg)  01/26/19 195 lb (88.5 kg)    Diabetic Foot Exam - Simple   No data filed     Lab Results  Component Value Date   WBC 8.0 01/07/2019   HGB 14.5 01/07/2019   HCT 43.9 01/07/2019   PLT 267 01/07/2019   GLUCOSE 89 12/17/2018   CHOL 146 12/17/2018   TRIG 152.0 (H) 12/17/2018  HDL 39.00 (L) 12/17/2018   LDLCALC 77 12/17/2018   ALT 17 12/17/2018   AST 14 12/17/2018   NA 139 12/17/2018   K 4.2 12/17/2018   CL 106 12/17/2018   CREATININE 0.84 12/17/2018   BUN 10 12/17/2018   CO2 25 12/17/2018   TSH 1.67 12/17/2018   HGBA1C 5.1 12/11/2018   MICROALBUR 0.8 03/31/2015    Lab Results  Component  Value Date   TSH 1.67 12/17/2018   Lab Results  Component Value Date   WBC 8.0 01/07/2019   HGB 14.5 01/07/2019   HCT 43.9 01/07/2019   MCV 93.2 01/07/2019   PLT 267 01/07/2019   Lab Results  Component Value Date   NA 139 12/17/2018   K 4.2 12/17/2018   CO2 25 12/17/2018   GLUCOSE 89 12/17/2018   BUN 10 12/17/2018   CREATININE 0.84 12/17/2018   BILITOT 0.4 12/17/2018   ALKPHOS 67 12/17/2018   AST 14 12/17/2018   ALT 17 12/17/2018   PROT 6.9 12/17/2018   ALBUMIN 4.4 12/17/2018   CALCIUM 9.5 12/17/2018   ANIONGAP 2 (L) 05/12/2014   GFR 80.07 12/17/2018   Lab Results  Component Value Date   CHOL 146 12/17/2018   Lab Results  Component Value Date   HDL 39.00 (L) 12/17/2018   Lab Results  Component Value Date   LDLCALC 77 12/17/2018   Lab Results  Component Value Date   TRIG 152.0 (H) 12/17/2018   Lab Results  Component Value Date   CHOLHDL 4 12/17/2018   Lab Results  Component Value Date   HGBA1C 5.1 12/11/2018       Assessment & Plan:   Problem List Items Addressed This Visit    Bipolar 1 disorder (HCC)    Increased Lamictal to 200 mg and referred back to psychiatry. She is counseled for 45 minutes.       Relevant Orders   Ambulatory referral to Behavioral Health   Ambulatory referral to Psychiatry   Anxiety as acute reaction to exceptional stress   Relevant Orders   Ambulatory referral to Behavioral Health   Ambulatory referral to Psychiatry   Acne   Insomnia    Encouraged good sleep hygiene such as dark, quiet room. No blue/green glowing lights such as computer screens in bedroom. No alcohol or stimulants in evening. Cut down on caffeine as able. Regular exercise is helpful but not just prior to bed time. She notes the Alprazolam is no longer helping. Will try and use Temazepam 15 mg caps, 1-2 caps qhs prn      Relevant Medications   temazepam (RESTORIL) 15 MG capsule   Other Relevant Orders   Ambulatory referral to Behavioral Health    Ambulatory referral to Psychiatry   Grief reaction - Primary    She is very sad as she has recently lost her 75 year old sister to a drug over dose. She feels overwhelmed and agitated. Cries easily and feels hopeless regularly. Had been doing well so was not seeing counselor or psychiatry lately. We will refer her back for counseling and psychiatry. Her Lamictal is increased to 200 mg daily as previously the 150 was helping.       Relevant Orders   Ambulatory referral to Behavioral Health   Ambulatory referral to Psychiatry    Other Visit Diagnoses    Hives          I have discontinued Amanada A. Boerner's Erenumab-aooe, diclofenac, lamoTRIgine, tiZANidine, Vitamin D (Ergocalciferol), calcium carbonate, ibuprofen,  metroNIDAZOLE, and HYDROcodone-acetaminophen. I am also having her start on lamoTRIgine and temazepam. Additionally, I am having her maintain her propranolol, albuterol, clobetasol cream, fluticasone, Vitamin D3, acetaminophen, aspirin-acetaminophen-caffeine, norethindrone, alprazolam, rizatriptan, and metFORMIN.  Meds ordered this encounter  Medications  . lamoTRIgine (LAMICTAL) 200 MG tablet    Sig: Take 1 tablet (200 mg total) by mouth daily.    Dispense:  30 tablet    Refill:  1  . temazepam (RESTORIL) 15 MG capsule    Sig: Take 1-2 capsules (15-30 mg total) by mouth at bedtime as needed for sleep.    Dispense:  45 capsule    Refill:  0     I discussed the assessment and treatment plan with the patient. The patient was provided an opportunity to ask questions and all were answered. The patient agreed with the plan and demonstrated an understanding of the instructions.   The patient was advised to call back or seek an in-person evaluation if the symptoms worsen or if the condition fails to improve as anticipated.  I provided 45 minutes of non-face-to-face time during this encounter.   Penni Homans, MD

## 2019-05-27 NOTE — Assessment & Plan Note (Signed)
Encouraged good sleep hygiene such as dark, quiet room. No blue/green glowing lights such as computer screens in bedroom. No alcohol or stimulants in evening. Cut down on caffeine as able. Regular exercise is helpful but not just prior to bed time. She notes the Alprazolam is no longer helping. Will try and use Temazepam 15 mg caps, 1-2 caps qhs prn

## 2019-05-27 NOTE — Assessment & Plan Note (Signed)
She is very sad as she has recently lost her 30 year old sister to a drug over dose. She feels overwhelmed and agitated. Cries easily and feels hopeless regularly. Had been doing well so was not seeing counselor or psychiatry lately. We will refer her back for counseling and psychiatry. Her Lamictal is increased to 200 mg daily as previously the 150 was helping.

## 2019-05-27 NOTE — Assessment & Plan Note (Signed)
Increased Lamictal to 200 mg and referred back to psychiatry. She is counseled for 45 minutes.

## 2019-06-04 ENCOUNTER — Ambulatory Visit (INDEPENDENT_AMBULATORY_CARE_PROVIDER_SITE_OTHER): Payer: Federal, State, Local not specified - PPO | Admitting: Family Medicine

## 2019-06-04 ENCOUNTER — Other Ambulatory Visit: Payer: Self-pay

## 2019-06-04 DIAGNOSIS — F4321 Adjustment disorder with depressed mood: Secondary | ICD-10-CM | POA: Diagnosis not present

## 2019-06-04 DIAGNOSIS — G4700 Insomnia, unspecified: Secondary | ICD-10-CM | POA: Diagnosis not present

## 2019-06-04 MED ORDER — ESZOPICLONE 2 MG PO TABS: 2.0000 mg | ORAL_TABLET | Freq: Every evening | ORAL | 1 refills | Status: DC | PRN

## 2019-06-04 NOTE — Assessment & Plan Note (Signed)
Temazepam only helped her sleep a couple of hours before waking up even at 30 mg. Will try changing to Luneta 2 mg and reevaluate.

## 2019-06-04 NOTE — Assessment & Plan Note (Addendum)
Today is the one month anniversary of her sister's death. She is at work and functioning but still very sad and crying frequently has tolerated increase of Lamictal but is not sure it helped much. She turned down the referrals to behavioral health as they were virtual and she prefers in person she is looking around for new locations but if she does not find one she might accept the virtual approach. No further changes for now. Spent 25 minutes in counseling.

## 2019-06-04 NOTE — Progress Notes (Signed)
Virtual Visit via phone Note  I connected with Jillian Reyes on 06/04/19 at 10:20 AM EDT by a phone enabled telemedicine application and verified that I am speaking with the correct person using two identifiers.  Location: Patient: work Provider: home   I discussed the limitations of evaluation and management by telemedicine and the availability of in person appointments. The patient expressed understanding and agreed to proceed. Sima Matas was able to get the patient set up on a visit, phone after being unable to setu p a video visit   Subjective:    Patient ID: Jillian Reyes, female    DOB: 21-Apr-1990, 29 y.o.   MRN: [id number]  Chief Complaint  Patient presents with  . Gridf reaction    Follow up  . Insomnia    Still having trouble sleeping    HPI Patient is in today for follow up on depression and grief reaction. A month ago today she lost her younger sister to a drug overdose and she has been very shaken up. She has been having trouble sleeping. Temazepam even at 30 mg only keeps her asleep a few hours. She notes the increased lamictal has not helped her mood but did not have bad side effects. She is awaiting a therapist and psychiatrist but she prefers hey be in person. She continues to have poor concentration,anhedonia and anxiety. Denies CP/palp/SOB/HA/congestion/fevers/GI or GU c/o. Taking meds as prescribed  Past Medical History:  Diagnosis Date  . Acne   . Asthma due to environmental allergies    per pt moderate, prn inhaler  . Bipolar 1 disorder (HCC)   . Chronic back pain   . Chronic pelvic pain in female   . Dyslipidemia 02/18/2013  . Eczema   . GAD (generalized anxiety disorder)   . GERD (gastroesophageal reflux disease)   . History of abnormal cervical Pap smear   . History of chlamydia 2015  . History of gestational diabetes   . Irregular periods/menstrual cycles   . Migraines   . PCOS (polycystic ovarian syndrome)   . Vitamin D deficiency 04/10/2015    . Wears glasses     Past Surgical History:  Procedure Laterality Date  . INCISION AND DRAINAGE Right 01/12/2019   Procedure: INCISION AND DRAINAGE OF LESION;  Surgeon: Jerene Bears, MD;  Location: [facility];  Service: Gynecology;  Laterality: Right;  . LAPAROSCOPY N/A 01/12/2019   Procedure: LAPAROSCOPY DIAGNOSTIC; LYSIS OF ADHESIONS;  Surgeon: Jerene Bears, MD;  Location: [facility];  Service: Gynecology;  Laterality: N/A;  possible treatement of endometriosis  . NO PAST SURGERIES    . WISDOM TOOTH EXTRACTION  2016    Family History  Problem Relation Age of Onset  . Heart disease Other   . Diabetes Father 70       type 2  . Thyroid disease Father   . Brain cancer Father   . Cancer Father        tumor of the brain cancer  . Migraines Mother   . Kidney disease Mother   . Colon polyps Mother   . Hyperlipidemia Other        great grandparents    Social History   Socioeconomic History  . Marital status: Married    Spouse name: Not on file  . Number of children: 1  . Years of education: Not on file  . Highest education level: Not on file  Occupational History  . Occupation: clerck  Comment: [employer]  Tobacco Use  . Smoking status: Current Every Day Smoker    Years: 13.00    Types: Cigarettes  . Smokeless tobacco: Never Used  . Tobacco comment: 01-09-2019  per pt currently 1-2 cig per day down from 1/2ppd  Substance and Sexual Activity  . Alcohol use: Not Currently  . Drug use: Never  . Sexual activity: Yes    Partners: Male    Birth control/protection: None    Comment: vasectomy   Other Topics Concern  . Not on file  Social History Narrative   Lives home with husband,  4 children.  Works for [employer]. Education college.   Social Determinants of Health   Financial Resource Strain:   . Difficulty of Paying Living Expenses:   Food Insecurity:   . Worried About Programme researcher, broadcasting/film/video in the Last Year:   . Barista in the  Last Year:   Transportation Needs:   . Freight forwarder (Medical):   Marland Kitchen Lack of Transportation (Non-Medical):   Physical Activity:   . Days of Exercise per Week:   . Minutes of Exercise per Session:   Stress:   . Feeling of Stress :   Social Connections:   . Frequency of Communication with Friends and Family:   . Frequency of Social Gatherings with Friends and Family:   . Attends Religious Services:   . Active Member of Clubs or Organizations:   . Attends Banker Meetings:   Marland Kitchen Marital Status:   Intimate Partner Violence:   . Fear of Current or Ex-Partner:   . Emotionally Abused:   Marland Kitchen Physically Abused:   . Sexually Abused:     Outpatient Medications Prior to Visit  Medication Sig Dispense Refill  . acetaminophen (TYLENOL) 500 MG tablet Take 500 mg by mouth every 6 (six) hours as needed.    Marland Kitchen albuterol (VENTOLIN HFA) 108 (90 Base) MCG/ACT inhaler Inhale 2 puffs into the lungs every 6 (six) hours as needed for wheezing or shortness of breath. 18 g 2  . alprazolam (XANAX) 2 MG tablet TAKE 1 TABLET (2 MG TOTAL) BY MOUTH AT BEDTIME AS NEEDED. FOR SLEEP. 30 tablet 5  . aspirin-acetaminophen-caffeine (EXCEDRIN MIGRAINE) 250-250-65 MG tablet Take by mouth every 6 (six) hours as needed for headache.    . Cholecalciferol (VITAMIN D3) 50 MCG (2000 UT) TABS Take by mouth daily.    . clobetasol cream (TEMOVATE) 0.05 % APPLY TOPICALLY DAILY AS NEEDED. ECZEMA 60 g 3  . fluticasone (FLONASE) 50 MCG/ACT nasal spray Place 1 spray into both nostrils 2 (two) times daily.     Marland Kitchen lamoTRIgine (LAMICTAL) 200 MG tablet Take 1 tablet (200 mg total) by mouth daily. 30 tablet 1  . metFORMIN (GLUCOPHAGE) 500 MG tablet Take 1 tablet (500 mg total) by mouth 2 (two) times daily with a meal. TAKE 1 TAB IN THE MORNING FOR 2 WEEKS, THEN INCREASE TO 1 TAB TWICE DAILY 60 tablet 12  . norethindrone (MICRONOR) 0.35 MG tablet Take 1 tablet (0.35 mg total) by mouth daily. 3 Package 3  . propranolol  (INDERAL) 20 MG tablet TAKE 1-2 TABLETS (20-40 MG TOTAL) BY MOUTH 3 (THREE) TIMES DAILY. (Patient taking differently: Take 20-40 mg by mouth 3 (three) times daily. ) 180 tablet 2  . rizatriptan (MAXALT) 10 MG tablet Take 1 tablet (10 mg total) by mouth as needed for migraine. May repeat in 2 hours if needed 20 tablet 0  . temazepam (RESTORIL) 15  MG capsule Take 1-2 capsules (15-30 mg total) by mouth at bedtime as needed for sleep. 45 capsule 0   No facility-administered medications prior to visit.    Allergies  Allergen Reactions  . Cymbalta [Duloxetine Hcl] Nausea And Vomiting    Review of Systems  Constitutional: Positive for malaise/fatigue. Negative for fever.  HENT: Negative for congestion.   Eyes: Negative for blurred vision.  Respiratory: Negative for shortness of breath.   Cardiovascular: Negative for chest pain, palpitations and leg swelling.  Gastrointestinal: Negative for abdominal pain, blood in stool and nausea.  Genitourinary: Negative for dysuria and frequency.  Musculoskeletal: Negative for falls.  Skin: Negative for rash.  Neurological: Negative for dizziness, loss of consciousness and headaches.  Endo/Heme/Allergies: Negative for environmental allergies.  Psychiatric/Behavioral: Positive for depression. The patient is nervous/anxious and has insomnia.        Objective:    Physical Exam unable to obtain via phone visit  There were no vitals taken for this visit. Wt Readings from Last 3 Encounters:  03/25/19 192 lb (87.1 kg)  01/30/19 192 lb 6.4 oz (87.3 kg)  01/26/19 195 lb (88.5 kg)    Diabetic Foot Exam - Simple   No data filed     Lab Results  Component Value Date   WBC 8.0 01/07/2019   HGB 14.5 01/07/2019   HCT 43.9 01/07/2019   PLT 267 01/07/2019   GLUCOSE 89 12/17/2018   CHOL 146 12/17/2018   TRIG 152.0 (H) 12/17/2018   HDL 39.00 (L) 12/17/2018   LDLCALC 77 12/17/2018   ALT 17 12/17/2018   AST 14 12/17/2018   NA 139 12/17/2018   K 4.2  12/17/2018   CL 106 12/17/2018   CREATININE 0.84 12/17/2018   BUN 10 12/17/2018   CO2 25 12/17/2018   TSH 1.67 12/17/2018   HGBA1C 5.1 12/11/2018   MICROALBUR 0.8 03/31/2015    Lab Results  Component Value Date   TSH 1.67 12/17/2018   Lab Results  Component Value Date   WBC 8.0 01/07/2019   HGB 14.5 01/07/2019   HCT 43.9 01/07/2019   MCV 93.2 01/07/2019   PLT 267 01/07/2019   Lab Results  Component Value Date   NA 139 12/17/2018   K 4.2 12/17/2018   CO2 25 12/17/2018   GLUCOSE 89 12/17/2018   BUN 10 12/17/2018   CREATININE 0.84 12/17/2018   BILITOT 0.4 12/17/2018   ALKPHOS 67 12/17/2018   AST 14 12/17/2018   ALT 17 12/17/2018   PROT 6.9 12/17/2018   ALBUMIN 4.4 12/17/2018   CALCIUM 9.5 12/17/2018   ANIONGAP 2 (L) 05/12/2014   GFR 80.07 12/17/2018   Lab Results  Component Value Date   CHOL 146 12/17/2018   Lab Results  Component Value Date   HDL 39.00 (L) 12/17/2018   Lab Results  Component Value Date   LDLCALC 77 12/17/2018   Lab Results  Component Value Date   TRIG 152.0 (H) 12/17/2018   Lab Results  Component Value Date   CHOLHDL 4 12/17/2018   Lab Results  Component Value Date   HGBA1C 5.1 12/11/2018       Assessment & Plan:   Problem List Items Addressed This Visit    Insomnia    Temazepam only helped her sleep a couple of hours before waking up even at 30 mg. Will try changing to Luneta 2 mg and reevaluate.       Grief reaction    Today is the one month anniversary of  her sister's death. She is at work and functioning but still very sad and crying frequently has tolerated increase of Lamictal but is not sure it helped much. She turned down the referrals to behavioral health as they were virtual and she prefers in person she is looking around for new locations but if she does not find one she might accept the virtual approach. No further changes for now. Spent 25 minutes in counseling.         I am having Rumaysa A. Pagnotta start on  eszopiclone. I am also having her maintain her propranolol, albuterol, clobetasol cream, fluticasone, Vitamin D3, acetaminophen, aspirin-acetaminophen-caffeine, norethindrone, alprazolam, rizatriptan, metFORMIN, lamoTRIgine, and temazepam.  Meds ordered this encounter  Medications  . eszopiclone (LUNESTA) 2 MG TABS tablet    Sig: Take 1 tablet (2 mg total) by mouth at bedtime as needed for sleep. Take immediately before bedtime    Dispense:  30 tablet    Refill:  1      I discussed the assessment and treatment plan with the patient. The patient was provided an opportunity to ask questions and all were answered. The patient agreed with the plan and demonstrated an understanding of the instructions.   The patient was advised to call back or seek an in-person evaluation if the symptoms worsen or if the condition fails to improve as anticipated.  I provided 25 minutes of non-face-to-face time during this encounter.   Danise Edge, MD

## 2019-06-22 ENCOUNTER — Other Ambulatory Visit: Payer: Self-pay

## 2019-06-22 ENCOUNTER — Ambulatory Visit (INDEPENDENT_AMBULATORY_CARE_PROVIDER_SITE_OTHER): Payer: Federal, State, Local not specified - PPO | Admitting: Orthopaedic Surgery

## 2019-06-22 ENCOUNTER — Encounter: Payer: Self-pay | Admitting: Orthopaedic Surgery

## 2019-06-22 DIAGNOSIS — G8929 Other chronic pain: Secondary | ICD-10-CM

## 2019-06-22 DIAGNOSIS — M25561 Pain in right knee: Secondary | ICD-10-CM

## 2019-06-22 DIAGNOSIS — Z9889 Other specified postprocedural states: Secondary | ICD-10-CM

## 2019-06-22 NOTE — Progress Notes (Signed)
The patient has been recovering from a right knee arthroscopy.  There was impingement of Hoffa's fat pad.  At her last visit she had only been to therapy 1 time due to an unfortunate death in the family with her sister.  She did have a large knee joint effusion which I did aspirate and place a steroid in the knee.  She reports that she is slowly getting better.  She has been unable to go to therapy due to the expense of therapy and has been working on her knee on her own.  On examination of her right knee she still has a mild effusion but it is much less than it was before.  She has full extension of her right knee and can flex to barely past 90 degrees.  She is working on this aggressively on her own.  We had a long thorough discussion about her right knee.  At this point there is nothing else that I recommend other than time and anti-inflammatories as well as aggressive motion of her knee and quad strengthening exercises.  All questions and concerns were answered and addressed.  She understands this can take 6 months to a year to really feel better from but hopefully we will get there with time.  If there is any issues she will come back and see [place].  Otherwise, follow-up is as needed.

## 2019-06-28 DIAGNOSIS — J029 Acute pharyngitis, unspecified: Secondary | ICD-10-CM | POA: Diagnosis not present

## 2019-06-28 DIAGNOSIS — B029 Zoster without complications: Secondary | ICD-10-CM | POA: Diagnosis not present

## 2019-06-30 ENCOUNTER — Other Ambulatory Visit: Payer: Self-pay

## 2019-06-30 ENCOUNTER — Ambulatory Visit: Payer: Federal, State, Local not specified - PPO | Admitting: Family Medicine

## 2019-07-21 ENCOUNTER — Other Ambulatory Visit: Payer: Self-pay

## 2019-07-21 ENCOUNTER — Ambulatory Visit: Payer: Self-pay

## 2019-07-21 ENCOUNTER — Encounter: Payer: Self-pay | Admitting: Orthopedic Surgery

## 2019-07-21 ENCOUNTER — Ambulatory Visit: Payer: Federal, State, Local not specified - PPO | Admitting: Orthopedic Surgery

## 2019-07-21 VITALS — Ht 65.0 in | Wt 192.0 lb

## 2019-07-21 DIAGNOSIS — M25571 Pain in right ankle and joints of right foot: Secondary | ICD-10-CM

## 2019-07-21 DIAGNOSIS — M25561 Pain in right knee: Secondary | ICD-10-CM | POA: Diagnosis not present

## 2019-07-22 ENCOUNTER — Encounter: Payer: Self-pay | Admitting: Orthopedic Surgery

## 2019-07-22 ENCOUNTER — Encounter: Payer: Self-pay | Admitting: Orthopaedic Surgery

## 2019-07-22 NOTE — Progress Notes (Signed)
Office Visit Note   Patient: Jillian Reyes           Date of Birth: 1990-12-12           MRN: [id number] Visit Date: 07/21/2019              Requested by: Bradd Canary, MD [address] [place],  [place] [address] PCP: Bradd Canary, MD  Chief Complaint  Patient presents with  . Right Knee - Pain  . Right Ankle - Pain      HPI: Patient is a 29 year old woman who is status post right knee arthroscopy with Dr. Magnus Ivan in January.  Patient states she works for the [employer] she returned to work yesterday she states she is on her feet all day long she states that her knee is weak she states she fell last night her a lot of pop and complains of knee pain and ankle pain.  She states she also has swelling around the knee and ankle.  Patient also complains of blunt trauma to the right hand.  Assessment & Plan: Visit Diagnoses:  1. Pain in right ankle and joints of right foot   2. Acute pain of right knee     Plan: Discussed that patient has multiple areas of blunt trauma recommend Aleve 2 p.o. twice daily and ice as needed.  States that she will return to work.  Follow-Up Instructions: Return if symptoms worsen or fail to improve.   Ortho Exam  Patient is alert, oriented, no adenopathy, well-dressed, normal affect, normal respiratory effort. Examination patient has bruising on the right hand around the right knee and right ankle.  The ankle is tender over the medial malleolus lateral malleolus is nontender to palpation there is tenderness to palpation of the deltoid ligament.  Anterior drawer is stable.  Examination of the right hand she has some bruising on the thenar eminence.  Right knee collaterals and cruciates are stable there is no effusion she has some tenderness to palpation medial and lateral joint line.  No mechanical symptoms with range of motion.  Imaging: No results found. No images are attached to the encounter.  Labs: Lab Results  Component  Value Date   HGBA1C 5.1 12/11/2018   REPTSTATUS 01/18/2019 FINAL 01/12/2019   GRAMSTAIN  01/12/2019    MODERATE WBC PRESENT,BOTH PMN AND MONONUCLEAR FEW GRAM POSITIVE COCCI RARE GRAM VARIABLE ROD Performed at [facility], [address] [place], [place] [address]    CULT  01/12/2019    FEW STAPHYLOCOCCUS LUGDUNENSIS RARE PROPIONIBACTERIUM SPECIES    LABORGA STAPHYLOCOCCUS LUGDUNENSIS 01/12/2019     Lab Results  Component Value Date   ALBUMIN 4.4 12/17/2018   ALBUMIN 4.3 01/14/2018   ALBUMIN 4.2 12/21/2016    No results found for: MG Lab Results  Component Value Date   VD25OH 24.84 (L) 12/17/2018   VD25OH 28.20 (L) 01/14/2018   VD25OH 20.97 (L) 12/21/2016    No results found for: PREALBUMIN CBC EXTENDED Latest Ref Rng & Units 01/07/2019 12/17/2018 01/14/2018  WBC 4.0 - 10.5 K/uL 8.0 9.6 9.2  RBC 3.87 - 5.11 MIL/uL 4.71 4.77 4.62  HGB 12.0 - 15.0 g/dL 16.3 84.5 36.4  HCT 68.0 - 46.0 % 43.9 44.2 42.7  PLT 150 - 400 K/uL 267 263.0 243.0  NEUTROABS 1.4 - 7.7 K/uL - - 5.0  LYMPHSABS 0.7 - 4.0 K/uL - - 3.3     Body mass index is 31.95 kg/m.  Orders:  Orders Placed This Encounter  Procedures  . XR Knee 1-2 Views Right  . XR Ankle 2 Views Right   No orders of the defined types were placed in this encounter.    Procedures: No procedures performed  Clinical Data: No additional findings.  ROS:  All other systems negative, except as noted in the HPI. Review of Systems  Objective: Vital Signs: Ht 5\' 5"  (1.651 m)   Wt 192 lb (87.1 kg)   BMI 31.95 kg/m   Specialty Comments:  No specialty comments available.  PMFS History: Patient Active Problem List   Diagnosis Date Noted  . Grief reaction 05/27/2019  . Painful coitus, female 12/05/2018  . Tachycardia 07/14/2018  . Migraine 06/24/2018  . Hypocalcemia 12/22/2016  . Right hand pain 12/22/2016  . Bilateral calf pain 12/21/2016  . Right knee pain 12/21/2016  . Closed nondisplaced fracture of  distal phalanx of right ring finger 12/07/2016  . Hemorrhoids 12/01/2016  . History of hidradenitis suppurativa 10/14/2016  . Abdominal pain 10/12/2016  . Left ankle sprain 04/10/2016  . Folliculitis 04/10/2016  . Bilateral neck pain 03/16/2016  . Preventative health care 04/10/2015  . Vitamin D deficiency 04/10/2015  . Dysuria 01/10/2014  . Allergic rhinitis 11/30/2013  . Migraine without aura and without status migrainosus, not intractable 11/17/2013  . Encounter for preconception consultation 07/27/2013  . Insomnia 07/02/2013  . Tendinopathy of rotator cuff 03/08/2013  . Dyslipidemia 02/18/2013  . Contraceptive management 01/06/2012  . Eczema 01/06/2012  . Acne 01/06/2012  . Hx of gestational diabetes mellitus, not currently pregnant   . Mid back pain   . Allergic state   . Abnormal cells of cervix   . Anxiety as acute reaction to exceptional stress 05/01/2011  . Bipolar 1 disorder (HCC) 07/05/2010   Past Medical History:  Diagnosis Date  . Acne   . Asthma due to environmental allergies    per pt moderate, prn inhaler  . Bipolar 1 disorder (HCC)   . Chronic back pain   . Chronic pelvic pain in female   . Dyslipidemia 02/18/2013  . Eczema   . GAD (generalized anxiety disorder)   . GERD (gastroesophageal reflux disease)   . History of abnormal cervical Pap smear   . History of chlamydia 2015  . History of gestational diabetes   . Irregular periods/menstrual cycles   . Migraines   . PCOS (polycystic ovarian syndrome)   . Vitamin D deficiency 04/10/2015  . Wears glasses     Family History  Problem Relation Age of Onset  . Heart disease Other   . Diabetes Father 37       type 2  . Thyroid disease Father   . Brain cancer Father   . Cancer Father        tumor of the brain cancer  . Migraines Mother   . Kidney disease Mother   . Colon polyps Mother   . Hyperlipidemia Other        great grandparents    Past Surgical History:  Procedure Laterality Date  .  INCISION AND DRAINAGE Right 01/12/2019   Procedure: INCISION AND DRAINAGE OF LESION;  Surgeon: 01/14/2019, MD;  Location: [facility];  Service: Gynecology;  Laterality: Right;  . LAPAROSCOPY N/A 01/12/2019   Procedure: LAPAROSCOPY DIAGNOSTIC; LYSIS OF ADHESIONS;  Surgeon: 01/14/2019, MD;  Location: [facility];  Service: Gynecology;  Laterality: N/A;  possible treatement of endometriosis  . NO PAST SURGERIES    .  WISDOM TOOTH EXTRACTION  2016   Social History   Occupational History  . Occupation: clerck     Comment: [employer]  Tobacco Use  . Smoking status: Current Every Day Smoker    Years: 13.00    Types: Cigarettes  . Smokeless tobacco: Never Used  . Tobacco comment: 01-09-2019  per pt currently 1-2 cig per day down from 1/2ppd  Substance and Sexual Activity  . Alcohol use: Not Currently  . Drug use: Never  . Sexual activity: Yes    Partners: Male    Birth control/protection: None    Comment: vasectomy

## 2019-07-28 ENCOUNTER — Other Ambulatory Visit: Payer: Self-pay | Admitting: Family Medicine

## 2019-07-28 ENCOUNTER — Other Ambulatory Visit: Payer: Self-pay | Admitting: Internal Medicine

## 2019-07-28 DIAGNOSIS — E785 Hyperlipidemia, unspecified: Secondary | ICD-10-CM

## 2019-07-29 ENCOUNTER — Other Ambulatory Visit: Payer: Self-pay

## 2019-07-29 ENCOUNTER — Encounter: Payer: Self-pay | Admitting: Orthopaedic Surgery

## 2019-07-29 ENCOUNTER — Ambulatory Visit: Payer: Federal, State, Local not specified - PPO | Admitting: Orthopaedic Surgery

## 2019-07-29 DIAGNOSIS — M25561 Pain in right knee: Secondary | ICD-10-CM | POA: Diagnosis not present

## 2019-07-29 DIAGNOSIS — M25571 Pain in right ankle and joints of right foot: Secondary | ICD-10-CM | POA: Diagnosis not present

## 2019-07-29 DIAGNOSIS — Z9889 Other specified postprocedural states: Secondary | ICD-10-CM

## 2019-07-29 MED ORDER — RIZATRIPTAN BENZOATE 10 MG PO TABS: 10.0000 mg | ORAL_TABLET | ORAL | 1 refills | Status: DC | PRN

## 2019-07-29 NOTE — Telephone Encounter (Signed)
Last OV--06/04/2019 No upcoming OV scheduled Last RF---01/27/2019---#30 with 5 refills UDS--09/17/2017--No CSC

## 2019-07-29 NOTE — Progress Notes (Signed)
The patient comes in today for continued follow-up of her right knee and her right ankle.  She actually had a significant mechanical fall last week injuring her right knee and her right ankle.  She did see one of my partners and x-rays were negative of the knee and the ankle for any type of fracture.  This is a knee that we actually did perform arthroscopic surgery on for impingement of Hoffa's fat pad and the trochlear groove in January of this year.  She has been doing well until this most recent fall.  She actually has a video of the fall and we are able to see this and she did follow-up quite hard and her knee hyperextended when she fell.  This look like it was on a slick wood floor.  She still having a lot of mechanical symptoms with her right knee with locking and catching.  Examination of her right knee she does have a more positive McMurray sign and Lachman's exam that we have had them before surgery.  Her ACL and PCL were intact before surgery.  There is swelling and bruising around her right knee as well.  There is also swelling and bruising around the right ankle.  The ankle is ligamentously stable.  I did review x-rays of her knee and her ankle from when she fell last week and went over these with her.  The x-rays were negative for any type of fracture.  Given the increased instability of her right knee on my clinical exam, a MRI is warranted of the right knee to rule out a meniscal tear or ACL tear.  We can put her in a hinged knee brace for the right knee and a ASO for the right ankle.  We will see her back after the MRI.  All question concerns were answered and addressed.

## 2019-07-30 ENCOUNTER — Encounter: Payer: Self-pay | Admitting: Orthopaedic Surgery

## 2019-07-31 ENCOUNTER — Other Ambulatory Visit: Payer: Federal, State, Local not specified - PPO

## 2019-08-03 ENCOUNTER — Other Ambulatory Visit: Payer: Self-pay

## 2019-08-03 ENCOUNTER — Ambulatory Visit
Admission: RE | Admit: 2019-08-03 | Discharge: 2019-08-03 | Disposition: A | Discharge status: Home / Self Care | Payer: Federal, State, Local not specified - PPO | Source: Ambulatory Visit | Attending: Orthopaedic Surgery | Admitting: Orthopaedic Surgery

## 2019-08-03 DIAGNOSIS — M25561 Pain in right knee: Secondary | ICD-10-CM | POA: Diagnosis not present

## 2019-08-03 NOTE — Telephone Encounter (Signed)
Patient scheduled at [facility] imaging 08/03/19

## 2019-08-12 ENCOUNTER — Other Ambulatory Visit: Payer: Self-pay

## 2019-08-12 ENCOUNTER — Ambulatory Visit: Payer: Federal, State, Local not specified - PPO | Admitting: Orthopaedic Surgery

## 2019-08-12 ENCOUNTER — Encounter: Payer: Self-pay | Admitting: Orthopaedic Surgery

## 2019-08-12 DIAGNOSIS — M25561 Pain in right knee: Secondary | ICD-10-CM

## 2019-08-12 MED ORDER — DICLOFENAC SODIUM 75 MG PO TBEC
75.0000 mg | DELAYED_RELEASE_TABLET | Freq: Two times a day (BID) | ORAL | 1 refills | Status: DC
Start: 2019-08-12 — End: 2019-10-26

## 2019-08-12 NOTE — Progress Notes (Signed)
Office Visit Note   Patient: Jillian Reyes           Date of Birth: May 02, 1990           MRN: [id number] Visit Date: 08/12/2019              Requested by: Bradd Canary, MD [id number] [place],  [place] [address] PCP: Bradd Canary, MD   Assessment & Plan: Visit Diagnoses:  1. Acute pain of right knee     Plan: Discussed with her knee friendly exercises.  Handout with right knee exercises given.  We will have her begin using Voltaren gel 4 times daily to the knee.  Also diclofenac orally no other NSAIDs while on the diclofenac and this is discussed with the patient.  She will follow-up with [place] in a month if she continues to have pain may consider repeat injection.  Questions were encouraged and answered by Dr. Magnus Ivan and myself.  Follow-Up Instructions: Return in about 4 weeks (around 09/09/2019).   Orders:  No orders of the defined types were placed in this encounter.  No orders of the defined types were placed in this encounter.     Procedures: No procedures performed   Clinical Data: No additional findings.   Subjective: Chief Complaint  Patient presents with  . Right Knee - Follow-up    HPI Jillian Reyes returns today follow-up of her right knee pain.  She underwent an MRI of the knees which showed no acute meniscal tear or ligamentous injury.  Patellar tendon tear is healing this is an interstitial tear that was seen on prior MRI.  Edema of the Hoffa's fat pad is seen.  She has been taking ibuprofen 600 mg twice daily for the knee pain.  She is also wearing a knee brace she is unsure if this is helping at all.  She is unable to use the hinged knee brace due to the fact that the metal stays seem to bother her knee.  Notes the swelling in her leg and ankle become worse throughout the day.  Knee continues to feel as if it catches and locks. Review of Systems See HPI otherwise negative noncontributory.  Objective: Vital Signs: There were no vitals  taken for this visit.  Physical Exam General: Well-developed well-nourished female no acute distress Ortho Exam Left knee good range of motion.  No instability valgus varus stressing left knee.  She has tenderness over the patellar tibial tendon.  Minimal swelling throughout the lower leg and calf supple nontender. Specialty Comments:  No specialty comments available.  Imaging: No results found.   PMFS History: Patient Active Problem List   Diagnosis Date Noted  . Grief reaction 05/27/2019  . Painful coitus, female 12/05/2018  . Tachycardia 07/14/2018  . Migraine 06/24/2018  . Hypocalcemia 12/22/2016  . Right hand pain 12/22/2016  . Bilateral calf pain 12/21/2016  . Right knee pain 12/21/2016  . Closed nondisplaced fracture of distal phalanx of right ring finger 12/07/2016  . Hemorrhoids 12/01/2016  . History of hidradenitis suppurativa 10/14/2016  . Abdominal pain 10/12/2016  . Left ankle sprain 04/10/2016  . Folliculitis 04/10/2016  . Bilateral neck pain 03/16/2016  . Preventative health care 04/10/2015  . Vitamin D deficiency 04/10/2015  . Dysuria 01/10/2014  . Allergic rhinitis 11/30/2013  . Migraine without aura and without status migrainosus, not intractable 11/17/2013  . Encounter for preconception consultation 07/27/2013  . Insomnia 07/02/2013  . Tendinopathy of rotator cuff 03/08/2013  .  Dyslipidemia 02/18/2013  . Contraceptive management 01/06/2012  . Eczema 01/06/2012  . Acne 01/06/2012  . Hx of gestational diabetes mellitus, not currently pregnant   . Mid back pain   . Allergic state   . Abnormal cells of cervix   . Anxiety as acute reaction to exceptional stress 05/01/2011  . Bipolar 1 disorder ([place]) 07/05/2010   Past Medical History:  Diagnosis Date  . Acne   . Asthma due to environmental allergies    per pt moderate, prn inhaler  . Bipolar 1 disorder ([place])   . Chronic back pain   . Chronic pelvic pain in female   . Dyslipidemia 02/18/2013  .  Eczema   . GAD (generalized anxiety disorder)   . GERD (gastroesophageal reflux disease)   . History of abnormal cervical Pap smear   . History of chlamydia 2015  . History of gestational diabetes   . Irregular periods/menstrual cycles   . Migraines   . PCOS (polycystic ovarian syndrome)   . Vitamin D deficiency 04/10/2015  . Wears glasses     Family History  Problem Relation Age of Onset  . Heart disease Other   . Diabetes Father 8       type 2  . Thyroid disease Father   . Brain cancer Father   . Cancer Father        tumor of the brain cancer  . Migraines Mother   . Kidney disease Mother   . Colon polyps Mother   . Hyperlipidemia Other        great grandparents    Past Surgical History:  Procedure Laterality Date  . INCISION AND DRAINAGE Right 01/12/2019   Procedure: INCISION AND DRAINAGE OF LESION;  Surgeon: Megan Salon, MD;  [facility];  Service: Gynecology;  Laterality: Right;  . LAPAROSCOPY N/A 01/12/2019   Procedure: LAPAROSCOPY DIAGNOSTIC; LYSIS OF ADHESIONS;  Surgeon: Megan Salon, MD;  Location: [facility];  Service: Gynecology;  Laterality: N/A;  possible treatement of endometriosis  . NO PAST SURGERIES    . WISDOM TOOTH EXTRACTION  2016   Social History   Occupational History  . Occupation: clerck     Comment: [employer]  Tobacco Use  . Smoking status: Current Every Day Smoker    Years: 13.00    Types: Cigarettes  . Smokeless tobacco: Never Used  . Tobacco comment: 01-09-2019  per pt currently 1-2 cig per day down from 1/2ppd  Substance and Sexual Activity  . Alcohol use: Not Currently  . Drug use: Never  . Sexual activity: Yes    Partners: Male    Birth control/protection: None    Comment: vasectomy

## 2019-08-24 ENCOUNTER — Telehealth (INDEPENDENT_AMBULATORY_CARE_PROVIDER_SITE_OTHER): Payer: Federal, State, Local not specified - PPO | Admitting: Family Medicine

## 2019-08-24 ENCOUNTER — Encounter: Payer: Self-pay | Admitting: Family Medicine

## 2019-08-24 ENCOUNTER — Other Ambulatory Visit: Payer: Self-pay

## 2019-08-24 DIAGNOSIS — J019 Acute sinusitis, unspecified: Secondary | ICD-10-CM | POA: Diagnosis not present

## 2019-08-24 DIAGNOSIS — Z03818 Encounter for observation for suspected exposure to other biological agents ruled out: Secondary | ICD-10-CM | POA: Diagnosis not present

## 2019-08-24 DIAGNOSIS — Z20822 Contact with and (suspected) exposure to covid-19: Secondary | ICD-10-CM | POA: Diagnosis not present

## 2019-08-24 MED ORDER — PREDNISONE 10 MG PO TABS: ORAL_TABLET | ORAL | 0 refills | Status: DC

## 2019-08-24 MED ORDER — AMOXICILLIN-POT CLAVULANATE 875-125 MG PO TABS: 1.0000 | ORAL_TABLET | Freq: Two times a day (BID) | ORAL | 0 refills | Status: DC

## 2019-08-24 MED ORDER — FLUCONAZOLE 150 MG PO TABS: ORAL_TABLET | ORAL | 0 refills | Status: DC

## 2019-08-24 NOTE — Progress Notes (Signed)
Virtual Visit via Video Note  I connected with Jillian Reyes on 08/24/19 at  2:40 PM EDT by a video enabled telemedicine application and verified that I am speaking with the correct person using two identifiers.  Location: Patient: home alone Provider: office    I discussed the limitations of evaluation and management by telemedicine and the availability of in person appointments. The patient expressed understanding and agreed to proceed.  History of Present Illness: Pt is home c/o congestion in sinuses -- she is taking IB/ tylenol.  No fever  No cough,   + sinus pressure/ headache Rapid covid test done-- negative    Observations/Objective:  There were no vitals filed for this visit. Pt is in bed--- feels lousy no sob  Assessment and Plan: 1. Acute non-recurrent sinusitis, unspecified location abx , flonase and pred per orders F/u prn  - amoxicillin-clavulanate (AUGMENTIN) 875-125 MG tablet; Take 1 tablet by mouth 2 (two) times daily.  Dispense: 20 tablet; Refill: 0 - fluconazole (DIFLUCAN) 150 MG tablet; 1 po x1, may repeat in 3 days prn  Dispense: 2 tablet; Refill: 0 - predniSONE (DELTASONE) 10 MG tablet; TAKE 3 TABLETS PO QD FOR 3 DAYS THEN TAKE 2 TABLETS PO QD FOR 3 DAYS THEN TAKE 1 TABLET PO QD FOR 3 DAYS THEN TAKE 1/2 TAB PO QD FOR 3 DAYS  Dispense: 20 tablet; Refill: 0   Follow Up Instructions:    I discussed the assessment and treatment plan with the patient. The patient was provided an opportunity to ask questions and all were answered. The patient agreed with the plan and demonstrated an understanding of the instructions.   The patient was advised to call back or seek an in-person evaluation if the symptoms worsen or if the condition fails to improve as anticipated.  I provided 25 minutes of non-face-to-face time during this encounter.   Donato Schultz, DO

## 2019-08-31 ENCOUNTER — Telehealth: Payer: Self-pay | Admitting: Obstetrics & Gynecology

## 2019-08-31 DIAGNOSIS — N83299 Other ovarian cyst, unspecified side: Secondary | ICD-10-CM

## 2019-08-31 NOTE — Telephone Encounter (Signed)
Call to patient. Per DPR, OK to leave message on voicemail.  Left voicemail requesting a return call to [place] to review benefits for scheduled Pelvic ultrasound with M. Suzanne Miller, MD 

## 2019-08-31 NOTE — Telephone Encounter (Signed)
Jan 13, 2019-Procedure:LAPAROSCOPY DIAGNOSTIC; LYSIS OF ADHESIONS (N/A Abdomen) INCISION AND DRAINAGE OF LESION (Right Buttocks) H/o PCOS H/o Ovarian cyst found on PUS in 2018-04-03 H/o irreg cycles  Spoke with pt. Pt states having intermittent sharp right lower abd pain x 1 month and no menses since 05/05/2019.Pt states " feels like the same pain before procedure in Oct"   Pt states last regular cycle was 04/04/19. Pt states sister died in 2021-02-16and doesn't know if this is related or not to missed cycles. Pt states husband had vasectomy and she is taking her daily Micronor Rx.  Pt states took 9 days of Provera Rx since had "left overs" from last Rx on 11/2018. Pt states still no cycle. Denies abd pain, fever, chills, NVD. Pt states taking Voltaren since 08/12/19  for recent fall on repaired knee. Had knee surgery 04-Apr-2019. Pt was advised by orthopedic to not take any Ibuprofen while taking Voltran.    Pt advised to have OV for further evaluation. Pt agreeable. Pt requesting to have PUS at this same time to check on cyst. Pt advised will schedule PUS and will review with Dr Hyacinth Meeker and return call if any additional recommendations.  Pt scheduled for PUS with OV consult with Dr Hyacinth Meeker on 09/10/19 at 2 pm. Pt agreeable and verbalized understanding to date and time of appt.   Routing to Dr Hyacinth Meeker.  Cc: [facility] for precert.  Orders placed.

## 2019-08-31 NOTE — Telephone Encounter (Signed)
Patient returned call

## 2019-08-31 NOTE — Telephone Encounter (Signed)
Patient has not had a period since March and is having stabbing pains on right side. Thinks she may have another cyst.

## 2019-09-01 NOTE — Telephone Encounter (Signed)
Spoke with patient regarding benefits for recommended ultrasound. Patient is aware that ultrasound is transvaginal. Patient acknowledges understanding of information presented. Patient is aware of cancellation policy. Patient rescheduled appointment for 09/03/2019 at 0830AM with M. Leda Quail, MD due to work schedule.

## 2019-09-02 ENCOUNTER — Other Ambulatory Visit: Payer: Self-pay

## 2019-09-03 ENCOUNTER — Ambulatory Visit (INDEPENDENT_AMBULATORY_CARE_PROVIDER_SITE_OTHER): Payer: Federal, State, Local not specified - PPO

## 2019-09-03 ENCOUNTER — Ambulatory Visit: Payer: Federal, State, Local not specified - PPO | Admitting: Obstetrics & Gynecology

## 2019-09-03 ENCOUNTER — Encounter: Payer: Self-pay | Admitting: Obstetrics & Gynecology

## 2019-09-03 VITALS — BP 110/64 | HR 70 | Temp 97.2°F | Resp 16 | Wt 194.0 lb

## 2019-09-03 DIAGNOSIS — N83299 Other ovarian cyst, unspecified side: Secondary | ICD-10-CM | POA: Diagnosis not present

## 2019-09-03 DIAGNOSIS — R1031 Right lower quadrant pain: Secondary | ICD-10-CM

## 2019-09-03 DIAGNOSIS — N912 Amenorrhea, unspecified: Secondary | ICD-10-CM | POA: Diagnosis not present

## 2019-09-03 DIAGNOSIS — G8929 Other chronic pain: Secondary | ICD-10-CM
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
## 2019-09-04 LAB — FOLLICLE STIMULATING HORMONE: FSH: 7.2 m[IU]/mL

## 2019-09-04 LAB — TSH: TSH: 1.38 u[IU]/mL (ref 0.450–4.500)

## 2019-09-04 LAB — PROLACTIN: Prolactin: 14.5 ng/mL (ref 4.8–23.3)

## 2019-09-06 DIAGNOSIS — R1031 Right lower quadrant pain: Secondary | ICD-10-CM | POA: Insufficient documentation

## 2019-09-06 DIAGNOSIS — G8929 Other chronic pain: Secondary | ICD-10-CM | POA: Insufficient documentation

## 2019-09-10 ENCOUNTER — Other Ambulatory Visit: Payer: Self-pay | Admitting: Obstetrics & Gynecology

## 2019-09-10 ENCOUNTER — Telehealth: Payer: Self-pay | Admitting: *Deleted

## 2019-09-10 ENCOUNTER — Other Ambulatory Visit: Payer: Self-pay

## 2019-09-10 NOTE — Telephone Encounter (Signed)
Leda Min, RN  09/10/2019 10:33 AM EDT Back to Top    Left message to call Noreene Larsson, RN at [facility] [phone].

## 2019-09-10 NOTE — Telephone Encounter (Signed)
-----   Message from Jerene Bears, MD sent at 09/09/2019  4:19 PM EDT ----- Please let pt know her TSH, FSH and prolactin were normal.  HCg was negative as well.  She is on micronor and has PCOS.  Cycles are not regular.  I would recommend switching to Loloestrin at this point.  She is a former smoker.  Please confirm that has not changed.  Ok to send in rx and recheck 3 months.  Thanks.    CC: Cornelia Copa, CMA

## 2019-09-13 LAB — SPECIMEN STATUS REPORT

## 2019-09-13 LAB — HCG, SERUM, QUALITATIVE: hCG,Beta Subunit,Qual,Serum: NEGATIVE m[IU]/mL (ref <6)

## 2019-09-16 MED ORDER — LO LOESTRIN FE 1 MG-10 MCG / 10 MCG PO TABS: 1.0000 | ORAL_TABLET | Freq: Every day | ORAL | 0 refills | Status: DC

## 2019-09-16 NOTE — Telephone Encounter (Signed)
Spoke with patient, advised of results and recommendations as seen below per Dr. Hyacinth Meeker. Patient confirms nothing ha changed with smoking status, she is a former smoker. Patient agreeable to change to OCP, LoLoestrin Rx sent to confirmed pharmacy. OV scheduled for 12/14/19 at 4:30pm with Dr. Hyacinth Meeker. Patient verbalizes understanding and is agreeable.   Encounter closed.

## 2019-09-28 ENCOUNTER — Other Ambulatory Visit: Payer: Self-pay | Admitting: Family Medicine

## 2019-09-28 DIAGNOSIS — E785 Hyperlipidemia, unspecified: Secondary | ICD-10-CM

## 2019-09-29 NOTE — Telephone Encounter (Signed)
Requesting: xanax Contract: n/a UDS:09/17/17 Last Visit:08/24/19 Next Visit:n/a Last Refill:07/29/19  Please Advise

## 2019-10-03 ENCOUNTER — Encounter: Payer: Self-pay | Admitting: Family Medicine

## 2019-10-05 NOTE — Telephone Encounter (Signed)
Called and got patient scheduled

## 2019-10-16 ENCOUNTER — Other Ambulatory Visit: Payer: Self-pay

## 2019-10-16 ENCOUNTER — Telehealth (INDEPENDENT_AMBULATORY_CARE_PROVIDER_SITE_OTHER): Payer: Federal, State, Local not specified - PPO | Admitting: Family Medicine

## 2019-10-16 ENCOUNTER — Telehealth: Payer: Self-pay | Admitting: *Deleted

## 2019-10-16 ENCOUNTER — Encounter: Payer: Self-pay | Admitting: Family Medicine

## 2019-10-16 DIAGNOSIS — R739 Hyperglycemia, unspecified: Secondary | ICD-10-CM

## 2019-10-16 DIAGNOSIS — E282 Polycystic ovarian syndrome: Secondary | ICD-10-CM | POA: Diagnosis not present

## 2019-10-16 DIAGNOSIS — E785 Hyperlipidemia, unspecified: Secondary | ICD-10-CM

## 2019-10-16 DIAGNOSIS — F319 Bipolar disorder, unspecified: Secondary | ICD-10-CM

## 2019-10-16 DIAGNOSIS — R109 Unspecified abdominal pain: Secondary | ICD-10-CM

## 2019-10-16 DIAGNOSIS — E559 Vitamin D deficiency, unspecified: Secondary | ICD-10-CM

## 2019-10-16 DIAGNOSIS — R000 Tachycardia, unspecified: Secondary | ICD-10-CM

## 2019-10-16 DIAGNOSIS — L659 Nonscarring hair loss, unspecified: Secondary | ICD-10-CM

## 2019-10-16 MED ORDER — FLUTICASONE PROPIONATE 50 MCG/ACT NA SUSP: 2.0000 | Freq: Two times a day (BID) | NASAL | 5 refills | Status: DC

## 2019-10-16 MED ORDER — QUETIAPINE FUMARATE 100 MG PO TABS: ORAL_TABLET | ORAL | 1 refills | Status: DC

## 2019-10-16 MED ORDER — MONTELUKAST SODIUM 10 MG PO TABS: 10.0000 mg | ORAL_TABLET | Freq: Every evening | ORAL | 3 refills | Status: DC | PRN

## 2019-10-16 MED ORDER — AZELASTINE HCL 0.1 % NA SOLN: 2.0000 | Freq: Two times a day (BID) | NASAL | 5 refills | Status: DC

## 2019-10-16 NOTE — Telephone Encounter (Signed)
Follow up in 6 weeks (in person or virtual)

## 2019-10-16 NOTE — Assessment & Plan Note (Addendum)
She is struggling with worsening irritability, depression and mood swings. She has been on Lamictal a long time. Her husband is on Seroquel and does well. Start on Seroquel 100 mg tabs 1/2 tab po x 1 day then 1 tab po x 1 day then 2 tabs po x 1 day and then increase to 2 tabs po daily. Reassess in 6 weeks or as needed. Discussed case and treatment plan for 25 mintues

## 2019-10-16 NOTE — Progress Notes (Signed)
Virtual Visit via phone Note  I connected with Jillian Reyes on 10/16/19 at 10:20 AM EDT by a phone enabled telemedicine application and verified that I am speaking with the correct person using two identifiers.  Location: Patient: home, patient and provider were in visit Provider: home   I discussed the limitations of evaluation and management by telemedicine and the availability of in person appointments. The patient expressed understanding and agreed to proceed. Thelma BargeSheketia Richardson, CMA was able to get the patient set up on a phone visit after being unable to set up a video visit    Subjective:    Patient ID: Jillian Reyes, female    DOB: 07/05/1990, 29 y.o.   MRN: [id number]  Chief Complaint  Patient presents with  . discuss lamictal, not working  . like to discuss menstrual cycle    HPI Patient is in today for follow up on chronic medical concerns. No recent febrile illness or hospitalizations. She is struggling with worsening irritability, depression and mood swings. She has been on Lamictal a long time. Her husband is on Seroquel and does well. She is willing to try it. No suicidal ideation noted but she does have anhedonia. Denies CP/palp/SOB/HA/congestion/fevers/GI or GU c/o. Taking meds as prescribed. She notes trouble with acne, abdominal pain and irregular and heavy periods despite being on OCP to Metformin.   Past Medical History:  Diagnosis Date  . Acne   . Asthma due to environmental allergies    per pt moderate, prn inhaler  . Bipolar 1 disorder (HCC)   . Chronic back pain   . Chronic pelvic pain in female   . Dyslipidemia 02/18/2013  . Eczema   . GAD (generalized anxiety disorder)   . GERD (gastroesophageal reflux disease)   . History of abnormal cervical Pap smear   . History of chlamydia 2015  . History of gestational diabetes   . Irregular periods/menstrual cycles   . Migraines   . PCOS (polycystic ovarian syndrome)   . Vitamin D deficiency 04/10/2015    . Wears glasses     Past Surgical History:  Procedure Laterality Date  . INCISION AND DRAINAGE Right 01/12/2019   Procedure: INCISION AND DRAINAGE OF LESION;  Surgeon: Jerene BearsMiller, Mary S, MD;  [facility];  Service: Gynecology;  Laterality: Right;  . LAPAROSCOPY N/A 01/12/2019   Procedure: LAPAROSCOPY DIAGNOSTIC; LYSIS OF ADHESIONS;  Surgeon: Jerene BearsMiller, Mary S, MD;  [facility] [place];  Service: Gynecology;  Laterality: N/A;  possible treatement of endometriosis  . NO PAST SURGERIES    . WISDOM TOOTH EXTRACTION  2016    Family History  Problem Relation Age of Onset  . Heart disease Other   . Diabetes Father 5935       type 2  . Thyroid disease Father   . Brain cancer Father   . Cancer Father        tumor of the brain cancer  . Migraines Mother   . Kidney disease Mother   . Colon polyps Mother   . Hyperlipidemia Other        great grandparents    Social History   Socioeconomic History  . Marital status: Married    Spouse name: Not on file  . Number of children: 1  . Years of education: Not on file  . Highest education level: Not on file  Occupational History  . Occupation: clerck     Comment: [employer]  Tobacco Use  . Smoking status:  Former Smoker    Years: 13.00    Types: Cigarettes  . Smokeless tobacco: Never Used  Vaping Use  . Vaping Use: Some days  . Devices: unsure  Substance and Sexual Activity  . Alcohol use: Not Currently  . Drug use: Never  . Sexual activity: Yes    Partners: Male    Birth control/protection: Pill    Comment: vasectomy   Other Topics Concern  . Not on file  Social History Narrative   Lives home with husband,  4 children.  Works for [employer]. Education college.   Social Determinants of Health   Financial Resource Strain:   . Difficulty of Paying Living Expenses:   Food Insecurity:   . Worried About Programme researcher, broadcasting/film/video in the Last Year:   . Barista in the Last Year:   Transportation Needs:    . Freight forwarder (Medical):   Marland Kitchen Lack of Transportation (Non-Medical):   Physical Activity:   . Days of Exercise per Week:   . Minutes of Exercise per Session:   Stress:   . Feeling of Stress :   Social Connections:   . Frequency of Communication with Friends and Family:   . Frequency of Social Gatherings with Friends and Family:   . Attends Religious Services:   . Active Member of Clubs or Organizations:   . Attends Banker Meetings:   Marland Kitchen Marital Status:   Intimate Partner Violence:   . Fear of Current or Ex-Partner:   . Emotionally Abused:   Marland Kitchen Physically Abused:   . Sexually Abused:     Outpatient Medications Prior to Visit  Medication Sig Dispense Refill  . albuterol (VENTOLIN HFA) 108 (90 Base) MCG/ACT inhaler Inhale 2 puffs into the lungs every 6 (six) hours as needed for wheezing or shortness of breath. 18 g 2  . alprazolam (XANAX) 2 MG tablet TAKE 1 TABLET (2 MG TOTAL) BY MOUTH AT BEDTIME AS NEEDED. FOR SLEEP. 30 tablet 1  . clobetasol cream (TEMOVATE) 0.05 % APPLY TOPICALLY DAILY AS NEEDED. ECZEMA 60 g 3  . diclofenac (VOLTAREN) 75 MG EC tablet Take 1 tablet (75 mg total) by mouth 2 (two) times daily. 60 tablet 1  . eszopiclone (LUNESTA) 2 MG TABS tablet Take 1 tablet (2 mg total) by mouth at bedtime as needed for sleep. Take immediately before bedtime 30 tablet 1  . metFORMIN (GLUCOPHAGE) 500 MG tablet Take 1 tablet (500 mg total) by mouth 2 (two) times daily with a meal. TAKE 1 TAB IN THE MORNING FOR 2 WEEKS, THEN INCREASE TO 1 TAB TWICE DAILY 60 tablet 12  . Norethindrone-Ethinyl Estradiol-Fe Biphas (LO LOESTRIN FE) 1 MG-10 MCG / 10 MCG tablet Take 1 tablet by mouth daily. 84 tablet 0  . propranolol (INDERAL) 20 MG tablet TAKE 1-2 TABLETS (20-40 MG TOTAL) BY MOUTH 3 (THREE) TIMES DAILY. (Patient taking differently: Take 20-40 mg by mouth 3 (three) times daily. ) 180 tablet 2  . rizatriptan (MAXALT) 10 MG tablet Take 1 tablet (10 mg total) by mouth as  needed for migraine. May repeat in 2 hours if needed 20 tablet 1  . fluticasone (FLONASE) 50 MCG/ACT nasal spray Place 1 spray into both nostrils 2 (two) times daily.     Marland Kitchen lamoTRIgine (LAMICTAL) 200 MG tablet Take 1 tablet (200 mg total) by mouth daily. 30 tablet 1  . fluconazole (DIFLUCAN) 150 MG tablet 1 po x1, may repeat in 3 days prn 2 tablet 0  .  predniSONE (DELTASONE) 10 MG tablet TAKE 3 TABLETS PO QD FOR 3 DAYS THEN TAKE 2 TABLETS PO QD FOR 3 DAYS THEN TAKE 1 TABLET PO QD FOR 3 DAYS THEN TAKE 1/2 TAB PO QD FOR 3 DAYS 20 tablet 0   No facility-administered medications prior to visit.    Allergies  Allergen Reactions  . Cymbalta [Duloxetine Hcl] Nausea And Vomiting    Review of Systems  Constitutional: Positive for malaise/fatigue. Negative for fever.  HENT: Negative for congestion.   Eyes: Negative for blurred vision.  Respiratory: Negative for shortness of breath.   Cardiovascular: Negative for chest pain, palpitations and leg swelling.  Gastrointestinal: Positive for abdominal pain. Negative for blood in stool and nausea.  Genitourinary: Negative for dysuria and frequency.  Musculoskeletal: Negative for falls.  Skin: Negative for rash.  Neurological: Negative for dizziness, loss of consciousness and headaches.  Endo/Heme/Allergies: Negative for environmental allergies.  Psychiatric/Behavioral: Positive for depression. Negative for hallucinations and substance abuse. The patient is nervous/anxious.        Objective:    Physical Exam unable to obtain via phone visit  There were no vitals taken for this visit. Wt Readings from Last 3 Encounters:  09/03/19 194 lb (88 kg)  07/21/19 192 lb (87.1 kg)  03/25/19 192 lb (87.1 kg)    Diabetic Foot Exam - Simple   No data filed     Lab Results  Component Value Date   WBC 8.0 01/07/2019   HGB 14.5 01/07/2019   HCT 43.9 01/07/2019   PLT 267 01/07/2019   GLUCOSE 89 12/17/2018   CHOL 146 12/17/2018   TRIG 152.0 (H)  12/17/2018   HDL 39.00 (L) 12/17/2018   LDLCALC 77 12/17/2018   ALT 17 12/17/2018   AST 14 12/17/2018   NA 139 12/17/2018   K 4.2 12/17/2018   CL 106 12/17/2018   CREATININE 0.84 12/17/2018   BUN 10 12/17/2018   CO2 25 12/17/2018   TSH 1.380 09/03/2019   HGBA1C 5.1 12/11/2018   MICROALBUR 0.8 03/31/2015    Lab Results  Component Value Date   TSH 1.380 09/03/2019   Lab Results  Component Value Date   WBC 8.0 01/07/2019   HGB 14.5 01/07/2019   HCT 43.9 01/07/2019   MCV 93.2 01/07/2019   PLT 267 01/07/2019   Lab Results  Component Value Date   NA 139 12/17/2018   K 4.2 12/17/2018   CO2 25 12/17/2018   GLUCOSE 89 12/17/2018   BUN 10 12/17/2018   CREATININE 0.84 12/17/2018   BILITOT 0.4 12/17/2018   ALKPHOS 67 12/17/2018   AST 14 12/17/2018   ALT 17 12/17/2018   PROT 6.9 12/17/2018   ALBUMIN 4.4 12/17/2018   CALCIUM 9.5 12/17/2018   ANIONGAP 2 (L) 05/12/2014   GFR 80.07 12/17/2018   Lab Results  Component Value Date   CHOL 146 12/17/2018   Lab Results  Component Value Date   HDL 39.00 (L) 12/17/2018   Lab Results  Component Value Date   LDLCALC 77 12/17/2018   Lab Results  Component Value Date   TRIG 152.0 (H) 12/17/2018   Lab Results  Component Value Date   CHOLHDL 4 12/17/2018   Lab Results  Component Value Date   HGBA1C 5.1 12/11/2018       Assessment & Plan:   Problem List Items Addressed This Visit    Bipolar 1 disorder (HCC)    She is struggling with worsening irritability, depression and mood swings. She has been  on Lamictal a long time. Her husband is on Seroquel and does well. Start on Seroquel 100 mg tabs 1/2 tab po x 1 day then 1 tab po x 1 day then 2 tabs po x 1 day and then increase to 2 tabs po daily. Reassess in 6 weeks or as needed. Discussed case and treatment plan for 25 mintues      Relevant Orders   Ambulatory referral to Psychiatry   Dyslipidemia   Relevant Orders   Lipid panel   Vitamin D deficiency   Relevant  Orders   VITAMIN D 25 Hydroxy (Vit-D Deficiency, Fractures)   Abdominal pain   Relevant Orders   CBC   Comprehensive metabolic panel   TSH   Hypocalcemia   Relevant Orders   Comprehensive metabolic panel   Tachycardia   Relevant Orders   TSH   PCOS (polycystic ovarian syndrome) - Primary    She continues to struggle with abdominal pain, acne, irregular and heavy periods despite being on OCPs and Metformin she [facility]      Relevant Orders   Ambulatory referral to Obstetrics / Gynecology    Other Visit Diagnoses    Hyperglycemia       Relevant Orders   Hemoglobin A1c   Hair loss       Relevant Orders   Zinc      I have discontinued Mackynzie A. Turek's lamoTRIgine, fluconazole, and predniSONE. I have also changed her fluticasone. Additionally, I am having her start on azelastine, montelukast, and QUEtiapine. Lastly, I am having her maintain her propranolol, albuterol, clobetasol cream, metFORMIN, eszopiclone, rizatriptan, diclofenac, Lo Loestrin Fe, and alprazolam.  Meds ordered this encounter  Medications  . fluticasone (FLONASE) 50 MCG/ACT nasal spray    Sig: Place 2 sprays into both nostrils 2 (two) times daily.    Dispense:  18.2 mL    Refill:  5  . azelastine (ASTELIN) 0.1 % nasal spray    Sig: Place 2 sprays into both nostrils 2 (two) times daily. Use in each nostril as directed    Dispense:  30 mL    Refill:  5  . montelukast (SINGULAIR) 10 MG tablet    Sig: Take 1 tablet (10 mg total) by mouth at bedtime as needed.    Dispense:  30 tablet    Refill:  3  . QUEtiapine (SEROQUEL) 100 MG tablet    Sig: 1/2 tab po qhs X 1d then 1 tab po qhs X 1d then 2 tab po qhs  1d then 3 tabs po qhs daily    Dispense:  90 tablet    Refill:  1    I discussed the assessment and treatment plan with the patient. The patient was provided an opportunity to ask questions and all were answered. The patient agreed with the plan and demonstrated an  understanding of the instructions.   The patient was advised to call back or seek an in-person evaluation if the symptoms worsen or if the condition fails to improve as anticipated.  I provided 25 minutes of non-face-to-face time during this encounter.   Danise Edge, MD

## 2019-10-16 NOTE — Assessment & Plan Note (Signed)
She continues to struggle with abdominal pain, acne, irregular and heavy periods despite being on OCPs and Metformin she is referred to [facility]

## 2019-10-22 ENCOUNTER — Other Ambulatory Visit: Payer: Self-pay | Admitting: Family Medicine

## 2019-10-24 ENCOUNTER — Other Ambulatory Visit: Payer: Self-pay | Admitting: Physician Assistant

## 2019-11-20 ENCOUNTER — Other Ambulatory Visit: Payer: Self-pay | Admitting: Family Medicine

## 2019-11-20 ENCOUNTER — Encounter: Payer: Self-pay | Admitting: Family Medicine

## 2019-11-20 ENCOUNTER — Telehealth: Payer: Self-pay | Admitting: Family Medicine

## 2019-11-20 DIAGNOSIS — J020 Streptococcal pharyngitis: Secondary | ICD-10-CM | POA: Diagnosis not present

## 2019-11-20 MED ORDER — FLUCONAZOLE 150 MG PO TABS: 150.0000 mg | ORAL_TABLET | Freq: Once | ORAL | 1 refills | Status: AC

## 2019-11-20 NOTE — Telephone Encounter (Signed)
Error

## 2019-11-25 NOTE — Telephone Encounter (Signed)
Done

## 2019-11-28 ENCOUNTER — Other Ambulatory Visit: Payer: Self-pay | Admitting: Family Medicine

## 2019-11-30 ENCOUNTER — Other Ambulatory Visit: Payer: Self-pay | Admitting: Family Medicine

## 2019-12-09 ENCOUNTER — Ambulatory Visit: Payer: Federal, State, Local not specified - PPO | Admitting: Family

## 2019-12-09 ENCOUNTER — Encounter: Payer: Self-pay | Admitting: Family

## 2019-12-09 ENCOUNTER — Encounter: Payer: Self-pay | Admitting: Neurology

## 2019-12-09 ENCOUNTER — Other Ambulatory Visit: Payer: Self-pay

## 2019-12-09 VITALS — BP 97/57 | HR 59 | Temp 98.1°F | Resp 16 | Ht 65.0 in | Wt 194.0 lb

## 2019-12-09 DIAGNOSIS — G43001 Migraine without aura, not intractable, with status migrainosus: Secondary | ICD-10-CM

## 2019-12-09 MED ORDER — ONDANSETRON HCL 4 MG/2ML IJ SOLN
4.0000 mg | Freq: Once | INTRAMUSCULAR | Status: AC
  Administered 2019-12-09: 4 mg via INTRAMUSCULAR

## 2019-12-09 MED ORDER — KETOROLAC TROMETHAMINE 60 MG/2ML IM SOLN
60.0000 mg | Freq: Once | INTRAMUSCULAR | Status: AC
  Administered 2019-12-09: 60 mg via INTRAMUSCULAR

## 2019-12-09 MED ORDER — ONDANSETRON 4 MG PO TBDP: 4.0000 mg | ORAL_TABLET | Freq: Three times a day (TID) | ORAL | 0 refills | Status: DC | PRN

## 2019-12-09 NOTE — Patient Instructions (Addendum)
Please call if your symptoms worsen or if they don't improve with today's treatment. You should be contacted about scheduling your appointment with [facility].

## 2019-12-09 NOTE — Addendum Note (Signed)
Addended by: Wilford Corner on: 12/09/2019 12:05 PM   Modules accepted: Orders

## 2019-12-09 NOTE — Progress Notes (Signed)
Subjective:    Patient ID: Jillian Reyes, female    DOB: September 11, 1990, 29 y.o.   MRN: [id number]  HPI  Patient is a 29 yr old female who presents today with chief complaint of migraine. She reports that migraine began 5 days ago.  Pain has been "up and down for last few days". Current pain level is 5/10. Denies associated photophobia.  No improvement with maxalt.  + nausea, no vomiting. Has hx of migraines.    Review of Systems    see HPI  Past Medical History:  Diagnosis Date  . Acne   . Asthma due to environmental allergies    per pt moderate, prn inhaler  . Bipolar 1 disorder (HCC)   . Chronic back pain   . Chronic pelvic pain in female   . Dyslipidemia 02/18/2013  . Eczema   . GAD (generalized anxiety disorder)   . GERD (gastroesophageal reflux disease)   . History of abnormal cervical Pap smear   . History of chlamydia 2015  . History of gestational diabetes   . Irregular periods/menstrual cycles   . Migraines   . PCOS (polycystic ovarian syndrome)   . Vitamin D deficiency 04/10/2015  . Wears glasses      Social History   Socioeconomic History  . Marital status: Married    Spouse name: Not on file  . Number of children: 1  . Years of education: Not on file  . Highest education level: Not on file  Occupational History  . Occupation: clerck     Comment: [employer]  Tobacco Use  . Smoking status: Former Smoker    Years: 13.00    Types: Cigarettes  . Smokeless tobacco: Never Used  Vaping Use  . Vaping Use: Some days  . Devices: unsure  Substance and Sexual Activity  . Alcohol use: Not Currently  . Drug use: Never  . Sexual activity: Yes    Partners: Male    Birth control/protection: Pill    Comment: vasectomy   Other Topics Concern  . Not on file  Social History Narrative   Lives home with husband,  4 children.  Works for [employer]. Education college.   Social Determinants of Health   Financial Resource Strain:   . Difficulty of Paying Living Expenses: Not  on file  Food Insecurity:   . Worried About Programme researcher, broadcasting/film/video in the Last Year: Not on file  . Ran Out of Food in the Last Year: Not on file  Transportation Needs:   . Lack of Transportation (Medical): Not on file  . Lack of Transportation (Non-Medical): Not on file  Physical Activity:   . Days of Exercise per Week: Not on file  . Minutes of Exercise per Session: Not on file  Stress:   . Feeling of Stress : Not on file  Social Connections:   . Frequency of Communication with Friends and Family: Not on file  . Frequency of Social Gatherings with Friends and Family: Not on file  . Attends Religious Services: Not on file  . Active Member of Clubs or Organizations: Not on file  . Attends Banker Meetings: Not on file  . Marital Status: Not on file  Intimate Partner Violence:   . Fear of Current or Ex-Partner: Not on file  . Emotionally Abused: Not on file  . Physically Abused: Not on file  . Sexually Abused: Not on file    Past Surgical History:  Procedure Laterality Date  . INCISION  AND DRAINAGE Right 01/12/2019   Procedure: INCISION AND DRAINAGE OF LESION;  Surgeon: Jerene Bears, MD;  [facility]: [facility];  Service: Gynecology;  Laterality: Right;  . LAPAROSCOPY N/A 01/12/2019   Procedure: LAPAROSCOPY DIAGNOSTIC; LYSIS OF ADHESIONS;  Surgeon: Jerene Bears, MD;  Location: [facility];  Service: Gynecology;  Laterality: N/A;  possible treatement of endometriosis  . NO PAST SURGERIES    . WISDOM TOOTH EXTRACTION  2016    Family History  Problem Relation Age of Onset  . Heart disease Other   . Diabetes Father 87       type 2  . Thyroid disease Father   . Brain cancer Father   . Cancer Father        tumor of the brain cancer  . Migraines Mother   . Kidney disease Mother   . Colon polyps Mother   . Hyperlipidemia Other        great grandparents    Allergies  Allergen Reactions  . Cymbalta [Duloxetine Hcl] Nausea And  Vomiting    Current Outpatient Medications on File Prior to Visit  Medication Sig Dispense Refill  . albuterol (VENTOLIN HFA) 108 (90 Base) MCG/ACT inhaler Inhale 2 puffs into the lungs every 6 (six) hours as needed for wheezing or shortness of breath. 18 g 2  . alprazolam (XANAX) 2 MG tablet TAKE 1 TABLET (2 MG TOTAL) BY MOUTH AT BEDTIME AS NEEDED. FOR SLEEP. 30 tablet 1  . clobetasol cream (TEMOVATE) 0.05 % APPLY TOPICALLY DAILY AS NEEDED. ECZEMA 60 g 3  . diclofenac (VOLTAREN) 75 MG EC tablet TAKE 1 TABLET BY MOUTH TWICE A DAY 60 tablet 1  . fluticasone (FLONASE) 50 MCG/ACT nasal spray Place 2 sprays into both nostrils 2 (two) times daily. 18.2 mL 5  . metFORMIN (GLUCOPHAGE) 500 MG tablet Take 1 tablet (500 mg total) by mouth 2 (two) times daily with a meal. TAKE 1 TAB IN THE MORNING FOR 2 WEEKS, THEN INCREASE TO 1 TAB TWICE DAILY 60 tablet 12  . propranolol (INDERAL) 20 MG tablet TAKE 1-2 TABLETS (20-40 MG TOTAL) BY MOUTH 3 (THREE) TIMES DAILY. (Patient taking differently: Take 20-40 mg by mouth 3 (three) times daily. ) 180 tablet 2  . QUEtiapine (SEROQUEL) 100 MG tablet Take 3 tablets (300 mg total) by mouth at bedtime. 270 tablet 1  . rizatriptan (MAXALT) 10 MG tablet Take 1 tablet (10 mg total) by mouth as needed for migraine. May repeat in 2 hours if needed 20 tablet 1  . [DISCONTINUED] naproxen (NAPROSYN) 375 MG tablet Take 1 tablet (375 mg total) by mouth 2 (two) times daily with a meal. 360 tablet 0   No current facility-administered medications on file prior to visit.    There were no vitals taken for this visit.   Objective:   Physical Exam Constitutional:      Appearance: She is well-developed.  Neck:     Thyroid: No thyromegaly.  Cardiovascular:     Rate and Rhythm: Normal rate and regular rhythm.     Heart sounds: Normal heart sounds. No murmur heard.   Pulmonary:     Effort: Pulmonary effort is normal. No respiratory distress.     Breath sounds: Normal breath  sounds. No wheezing.  Musculoskeletal:     Cervical back: Neck supple.  Skin:    General: Skin is warm and dry.  Neurological:     Mental Status: She is alert and oriented to person, place,  and time.  Psychiatric:        Behavior: Behavior normal.        Thought Content: Thought content normal.        Judgment: Judgment normal.           Assessment & Plan:  Migraine- uncontrolled. Will plan to treat with toradol 60mg  IM and zofran 4mg  IM.  She has maxalt for prn use at home. Rx also sent for prn zofran.  She is also requesting a referral to a different neurology. Referral has been placed.

## 2019-12-10 ENCOUNTER — Telehealth: Payer: Self-pay | Admitting: Obstetrics & Gynecology

## 2019-12-10 NOTE — Telephone Encounter (Signed)
Patient cancelled 3 month ocp recheck and will call back to reschedule.

## 2019-12-14 ENCOUNTER — Ambulatory Visit: Payer: Self-pay | Admitting: Obstetrics & Gynecology

## 2020-01-08 ENCOUNTER — Other Ambulatory Visit: Payer: Self-pay

## 2020-01-08 ENCOUNTER — Telehealth (INDEPENDENT_AMBULATORY_CARE_PROVIDER_SITE_OTHER): Payer: Federal, State, Local not specified - PPO | Admitting: Family Medicine

## 2020-01-08 DIAGNOSIS — S93402A Sprain of unspecified ligament of left ankle, initial encounter: Secondary | ICD-10-CM | POA: Diagnosis not present

## 2020-01-08 DIAGNOSIS — G43009 Migraine without aura, not intractable, without status migrainosus: Secondary | ICD-10-CM

## 2020-01-08 DIAGNOSIS — E785 Hyperlipidemia, unspecified: Secondary | ICD-10-CM

## 2020-01-08 DIAGNOSIS — M25572 Pain in left ankle and joints of left foot: Secondary | ICD-10-CM | POA: Diagnosis not present

## 2020-01-08 MED ORDER — PROMETHAZINE HCL 25 MG PO TABS: 25.0000 mg | ORAL_TABLET | Freq: Three times a day (TID) | ORAL | 1 refills | Status: DC | PRN

## 2020-01-08 MED ORDER — ALPRAZOLAM 2 MG PO TABS: 2.0000 mg | ORAL_TABLET | Freq: Every evening | ORAL | 3 refills | Status: DC | PRN

## 2020-01-08 MED ORDER — DICLOFENAC SODIUM 75 MG PO TBEC: 75.0000 mg | DELAYED_RELEASE_TABLET | Freq: Two times a day (BID) | ORAL | 0 refills | Status: DC

## 2020-01-10 NOTE — Assessment & Plan Note (Signed)
Rolled her ankle inward 2 days ago. Very swollen over lateral malleolus. She is able to bare weight and it is improving so she is encouraged to follow the RICE guidelines and let us know if worsens. There is an xray order on file to proceed with xray if pain worsens or does not resolve.

## 2020-01-10 NOTE — Progress Notes (Signed)
Virtual Visit via phone Note  I connected with Jillian Reyes on 01/08/20 at  9:20 AM EDT by a phone enabled telemedicine application and verified that I am speaking with the correct person using two identifiers. S Chism, CMA was able to get the patient set up on a phone visit after being unable to set up a video visit.  Location: Patient: home, patient and provider in visit Provider: home   I discussed the limitations of evaluation and management by telemedicine and the availability of in person appointments. The patient expressed understanding and agreed to proceed. S Chism, CMA was able to get the patient set up on a phone visit after being able to set up a video visit   Subjective:    Patient ID: Jillian Reyes, female    DOB: [birth year], 29 y.o.   MRN: [id number]  Chief Complaint  Patient presents with  . Follow-up    HPI Patient is in today for follow up on chronic medical concerns. No recent febrile illness or hospitalizations. She is having worsening of her Migraines recently. She has daily headaches and some are debilitating and some are not. She has been able to move up her neurology appt to next week. Her meds are not really helping. She fell and sprained her left ankle 2 days ago and significant pain and swelling over lateral malleolus. Can bare weight. Denies CP/palp/SOB/HA/congestion/fevers/GI or GU c/o. Taking meds as prescribed  Past Medical History:  Diagnosis Date  . Acne   . Asthma due to environmental allergies    per pt moderate, prn inhaler  . Bipolar 1 disorder (HCC)   . Chronic back pain   . Chronic pelvic pain in female   . Dyslipidemia 02/18/2013  . Eczema   . GAD (generalized anxiety disorder)   . GERD (gastroesophageal reflux disease)   . History of abnormal cervical Pap smear   . History of chlamydia 2015  . History of gestational diabetes   . Irregular periods/menstrual cycles   . Migraines   . PCOS (polycystic ovarian syndrome)   . Vitamin D  deficiency 04/10/2015  . Wears glasses     Past Surgical History:  Procedure Laterality Date  . INCISION AND DRAINAGE Right 01/12/2019   Procedure: INCISION AND DRAINAGE OF LESION;  Surgeon: Jerene Bears, MD;  Location: [facility];  Service: Gynecology;  Laterality: Right;  . LAPAROSCOPY N/A 01/12/2019   Procedure: LAPAROSCOPY DIAGNOSTIC; LYSIS OF ADHESIONS;  Surgeon: Jerene Bears, MD;  Location: [facility];  Service: Gynecology;  Laterality: N/A;  possible treatement of endometriosis  . NO PAST SURGERIES    . WISDOM TOOTH EXTRACTION  2016    Family History  Problem Relation Age of Onset  . Heart disease Other   . Diabetes Father 55       type 2  . Thyroid disease Father   . Brain cancer Father   . Cancer Father        tumor of the brain cancer  . Migraines Mother   . Kidney disease Mother   . Colon polyps Mother   . Hyperlipidemia Other        great grandparents    Social History   Socioeconomic History  . Marital status: Married    Spouse name: Not on file  . Number of children: 1  . Years of education: Not on file  . Highest education level: Not on file  Occupational History  . Occupation: clerck  Comment: [employer]  Tobacco Use  . Smoking status: Former Smoker    Years: 13.00    Types: Cigarettes  . Smokeless tobacco: Never Used  Vaping Use  . Vaping Use: Some days  . Devices: unsure  Substance and Sexual Activity  . Alcohol use: Not Currently  . Drug use: Never  . Sexual activity: Yes    Partners: Male    Birth control/protection: Pill    Comment: vasectomy   Other Topics Concern  . Not on file  Social History Narrative   Lives home with husband,  4 children.  Works for [employer]. Education college.   Social Determinants of Health   Financial Resource Strain:   . Difficulty of Paying Living Expenses: Not on file  Food Insecurity:   . Worried About Programme researcher, broadcasting/film/videounning Out of Food in the Last Year: Not on file  . Ran Out of Food in  the Last Year: Not on file  Transportation Needs:   . Lack of Transportation (Medical): Not on file  . Lack of Transportation (Non-Medical): Not on file  Physical Activity:   . Days of Exercise per Week: Not on file  . Minutes of Exercise per Session: Not on file  Stress:   . Feeling of Stress : Not on file  Social Connections:   . Frequency of Communication with Friends and Family: Not on file  . Frequency of Social Gatherings with Friends and Family: Not on file  . Attends Religious Services: Not on file  . Active Member of Clubs or Organizations: Not on file  . Attends BankerClub or Organization Meetings: Not on file  . Marital Status: Not on file  Intimate Partner Violence:   . Fear of Current or Ex-Partner: Not on file  . Emotionally Abused: Not on file  . Physically Abused: Not on file  . Sexually Abused: Not on file    Outpatient Medications Prior to Visit  Medication Sig Dispense Refill  . albuterol (VENTOLIN HFA) 108 (90 Base) MCG/ACT inhaler Inhale 2 puffs into the lungs every 6 (six) hours as needed for wheezing or shortness of breath. 18 g 2  . clobetasol cream (TEMOVATE) 0.05 % APPLY TOPICALLY DAILY AS NEEDED. ECZEMA 60 g 3  . fluticasone (FLONASE) 50 MCG/ACT nasal spray Place 2 sprays into both nostrils 2 (two) times daily. 18.2 mL 5  . metFORMIN (GLUCOPHAGE) 500 MG tablet Take 1 tablet (500 mg total) by mouth 2 (two) times daily with a meal. TAKE 1 TAB IN THE MORNING FOR 2 WEEKS, THEN INCREASE TO 1 TAB TWICE DAILY 60 tablet 12  . ondansetron (ZOFRAN ODT) 4 MG disintegrating tablet Take 1 tablet (4 mg total) by mouth every 8 (eight) hours as needed for nausea or vomiting. 20 tablet 0  . propranolol (INDERAL) 20 MG tablet TAKE 1-2 TABLETS (20-40 MG TOTAL) BY MOUTH 3 (THREE) TIMES DAILY. (Patient taking differently: Take 20-40 mg by mouth 3 (three) times daily. ) 180 tablet 2  . QUEtiapine (SEROQUEL) 100 MG tablet Take 3 tablets (300 mg total) by mouth at bedtime. 270 tablet 1    . rizatriptan (MAXALT) 10 MG tablet Take 1 tablet (10 mg total) by mouth as needed for migraine. May repeat in 2 hours if needed 20 tablet 1  . alprazolam (XANAX) 2 MG tablet TAKE 1 TABLET (2 MG TOTAL) BY MOUTH AT BEDTIME AS NEEDED. FOR SLEEP. 30 tablet 1   No facility-administered medications prior to visit.    Allergies  Allergen Reactions  . Cymbalta [  Duloxetine Hcl] Nausea And Vomiting    Review of Systems  Constitutional: Positive for malaise/fatigue. Negative for fever.  HENT: Negative for congestion.   Eyes: Negative for blurred vision.  Respiratory: Negative for shortness of breath.   Cardiovascular: Negative for chest pain, palpitations and leg swelling.  Gastrointestinal: Negative for abdominal pain, blood in stool and nausea.  Genitourinary: Negative for dysuria and frequency.  Musculoskeletal: Positive for joint pain. Negative for falls.  Skin: Negative for rash.  Neurological: Positive for headaches. Negative for dizziness and loss of consciousness.  Endo/Heme/Allergies: Negative for environmental allergies.  Psychiatric/Behavioral: Negative for depression. The patient is not nervous/anxious.        Objective:    Physical Exam Constitutional:      Appearance: Normal appearance. She is not ill-appearing.  HENT:     Head: Normocephalic and atraumatic.     Right Ear: External ear normal.     Left Ear: External ear normal.     Nose: Nose normal.  Eyes:     General:        Right eye: No discharge.        Left eye: No discharge.  Pulmonary:     Effort: Pulmonary effort is normal.  Neurological:     Mental Status: She is alert and oriented to person, place, and time.  Psychiatric:        Behavior: Behavior normal.     There were no vitals taken for this visit. Wt Readings from Last 3 Encounters:  12/09/19 194 lb (88 kg)  09/03/19 194 lb (88 kg)  07/21/19 192 lb (87.1 kg)    Diabetic Foot Exam - Simple   No data filed     Lab Results  Component  Value Date   WBC 8.0 01/07/2019   HGB 14.5 01/07/2019   HCT 43.9 01/07/2019   PLT 267 01/07/2019   GLUCOSE 89 12/17/2018   CHOL 146 12/17/2018   TRIG 152.0 (H) 12/17/2018   HDL 39.00 (L) 12/17/2018   LDLCALC 77 12/17/2018   ALT 17 12/17/2018   AST 14 12/17/2018   NA 139 12/17/2018   K 4.2 12/17/2018   CL 106 12/17/2018   CREATININE 0.84 12/17/2018   BUN 10 12/17/2018   CO2 25 12/17/2018   TSH 1.380 09/03/2019   HGBA1C 5.1 12/11/2018   MICROALBUR 0.8 03/31/2015    Lab Results  Component Value Date   TSH 1.380 09/03/2019   Lab Results  Component Value Date   WBC 8.0 01/07/2019   HGB 14.5 01/07/2019   HCT 43.9 01/07/2019   MCV 93.2 01/07/2019   PLT 267 01/07/2019   Lab Results  Component Value Date   NA 139 12/17/2018   K 4.2 12/17/2018   CO2 25 12/17/2018   GLUCOSE 89 12/17/2018   BUN 10 12/17/2018   CREATININE 0.84 12/17/2018   BILITOT 0.4 12/17/2018   ALKPHOS 67 12/17/2018   AST 14 12/17/2018   ALT 17 12/17/2018   PROT 6.9 12/17/2018   ALBUMIN 4.4 12/17/2018   CALCIUM 9.5 12/17/2018   ANIONGAP 2 (L) 05/12/2014   GFR 80.07 12/17/2018   Lab Results  Component Value Date   CHOL 146 12/17/2018   Lab Results  Component Value Date   HDL 39.00 (L) 12/17/2018   Lab Results  Component Value Date   LDLCALC 77 12/17/2018   Lab Results  Component Value Date   TRIG 152.0 (H) 12/17/2018   Lab Results  Component Value Date   CHOLHDL 4 12/17/2018  Lab Results  Component Value Date   HGBA1C 5.1 12/11/2018       Assessment & Plan:   Problem List Items Addressed This Visit    Dyslipidemia   Relevant Medications   alprazolam (XANAX) 2 MG tablet   Migraine without aura and without status migrainosus, not intractable    Headaches have been worse lately and recent injections not very helpful. She has been able to move up her neurology appointment to next week. She is given some Promethazine to try adding to her current regimen to help until she is seen.  Encouraged increased hydration, 64 ounces of clear fluids daily. Minimize alcohol and caffeine. Eat small frequent meals with lean proteins and complex carbs. Avoid high and low blood sugars. Get adequate sleep, 7-8 hours a night. Needs exercise daily preferably in the morning.      Relevant Medications   diclofenac (VOLTAREN) 75 MG EC tablet   Left ankle sprain    Rolled her ankle inward 2 days ago. Very swollen over lateral malleolus. She is able to bare weight and it is improving so she is encouraged to follow the RICE guidelines and let us know if worsens. There is an xray order on file to proceed with xray if pain worsens or does not resolve.        Other Visit Diagnoses    Acute left ankle pain    -  Primary   Relevant Orders   DG Ankle Complete Left      I am having Miabella A. Culbreth start on promethazine and diclofenac. I am also having her maintain her propranolol, albuterol, clobetasol cream, metFORMIN, rizatriptan, fluticasone, QUEtiapine, ondansetron, and alprazolam.  Meds ordered this encounter  Medications  . promethazine (PHENERGAN) 25 MG tablet    Sig: Take 1 tablet (25 mg total) by mouth every 8 (eight) hours as needed for nausea or vomiting.    Dispense:  30 tablet    Refill:  1  . diclofenac (VOLTAREN) 75 MG EC tablet    Sig: Take 1 tablet (75 mg total) by mouth 2 (two) times daily.    Dispense:  30 tablet    Refill:  0  . alprazolam (XANAX) 2 MG tablet    Sig: Take 1 tablet (2 mg total) by mouth at bedtime as needed. for sleep.    Dispense:  30 tablet    Refill:  3    Not to exceed 4 additional fills before 01/25/2020 DX Code Needed  .     I discussed the assessment and treatment plan with the patient. The patient was provided an opportunity to ask questions and all were answered. The patient agreed with the plan and demonstrated an understanding of the instructions.   The patient was advised to call back or seek an in-person evaluation if the symptoms worsen  or if the condition fails to improve as anticipated.  I provided 15 minutes of non-face-to-face time during this encounter.   Danise Edge, MD

## 2020-01-10 NOTE — Assessment & Plan Note (Signed)
Headaches have been worse lately and recent injections not very helpful. She has been able to move up her neurology appointment to next week. She is given some Promethazine to try adding to her current regimen to help until she is seen. Encouraged increased hydration, 64 ounces of clear fluids daily. Minimize alcohol and caffeine. Eat small frequent meals with lean proteins and complex carbs. Avoid high and low blood sugars. Get adequate sleep, 7-8 hours a night. Needs exercise daily preferably in the morning.

## 2020-01-13 NOTE — Progress Notes (Signed)
NEUROLOGY CONSULTATION NOTE  Jillian Reyes MRN: [id number] DOB: Aug 10, 1990  Referring provider: Sandford Craze, NP Primary care provider: Danise Edge, MD  Reason for consult:  migraines  HISTORY OF PRESENT ILLNESS: Jillian Reyes is a 29 year old right-handed female who presents for migraines.  History supplemented by prior neurologist's and referring provider's notes.  Beginning at age 58 years old, she had headaches.  In 2012 headaches became more severe.  They start in the back of the neck and base of her skull and radiate to front of head bilaterally.  It is throbbing and associated with nausea, photophobia, phonophobia, rarely vomiting.  Sometimes sees stars or dots.  They usually last 1 to  2 days.  They occur once a month to once every other month.  However, she a dull headache 20 days a month.  Sometimes dull headache may be in temples as well.  Triggers include perfumes and cigarette smell.  Rest helps.  Maxalt ineffective.  Takes Excedrin Migraine daily.  MRI of cervical spine from 03/22/2015 personally reviewed and was unremarkable.  Current NSAIDS/analgesics:  Excedrin Migraine. Diclofenac 75mg  BID (for knee pain) Current triptans:  none Current ergotamine:  none Current anti-emetic:  Zofran ODT 4mg  Current muscle relaxants:  none Current Antihypertensive medications:  none Current Antidepressant medications:  none Current Anticonvulsant medications:  none Current anti-CGRP:  none Current Vitamins/Herbal/Supplements:  MVI Current Antihistamines/Decongestants:  Flonase Other therapy:  none Hormone/birth control:  none   Past NSAIDS/analgesics:  Advil, Tylenol, naproxen Past abortive triptans:  sumatriptan tab, Maxalt Past abortive ergotamine:  none Past muscle relaxants:  Flexeril, tizanidine Past anti-emetic:  promethazine Past antihypertensive medications:  propranolol Past antidepressant medications:  Duloxetine (side effects) Past anticonvulsant  medications:  Topiramate, Depakote Past anti-CGRP:  Aimovig 140mg  (made her sick) Past vitamins/Herbal/Supplements:  none Past antihistamines/decongestants:  none Other past therapies:  Dry needling (helped)  Caffeine:  1 cup coffee daily.  Soda once in a while Diet:  3 bottles of water daily.  Skips meals Exercise:  No Depression:  yes; Anxiety:  Yes.  Sister recently passed away from a drug overdose.  History of Bipolar disorder. Other pain:  no Sleep hygiene:  Poor.  Trouble staying asleep. Family history of headache:  Mom (migraines), father (migraines, brain tumor) She has PCOS.  She has irregular periods.     PAST MEDICAL HISTORY: Past Medical History:  Diagnosis Date  . Acne   . Asthma due to environmental allergies    per pt moderate, prn inhaler  . Bipolar 1 disorder (HCC)   . Chronic back pain   . Chronic pelvic pain in female   . Dyslipidemia 02/18/2013  . Eczema   . GAD (generalized anxiety disorder)   . GERD (gastroesophageal reflux disease)   . History of abnormal cervical Pap smear   . History of chlamydia 2015  . History of gestational diabetes   . Irregular periods/menstrual cycles   . Migraines   . PCOS (polycystic ovarian syndrome)   . Vitamin D deficiency 04/10/2015  . Wears glasses     PAST SURGICAL HISTORY: Past Surgical History:  Procedure Laterality Date  . INCISION AND DRAINAGE Right 01/12/2019   Procedure: INCISION AND DRAINAGE OF LESION;  Surgeon: 14/05/2012, MD;  Location: [facility];  Service: Gynecology;  Laterality: Right;  . LAPAROSCOPY N/A 01/12/2019   Procedure: LAPAROSCOPY DIAGNOSTIC; LYSIS OF ADHESIONS;  Surgeon: Jerene Bears, MD;  Location: [facility];  Service: Gynecology;  Laterality: N/A;  possible treatement of endometriosis  . NO PAST SURGERIES    . WISDOM TOOTH EXTRACTION  2016    MEDICATIONS: Current Outpatient Medications on File Prior to Visit  Medication Sig Dispense Refill  .  albuterol (VENTOLIN HFA) 108 (90 Base) MCG/ACT inhaler Inhale 2 puffs into the lungs every 6 (six) hours as needed for wheezing or shortness of breath. 18 g 2  . alprazolam (XANAX) 2 MG tablet Take 1 tablet (2 mg total) by mouth at bedtime as needed. for sleep. 30 tablet 3  . clobetasol cream (TEMOVATE) 0.05 % APPLY TOPICALLY DAILY AS NEEDED. ECZEMA 60 g 3  . diclofenac (VOLTAREN) 75 MG EC tablet Take 1 tablet (75 mg total) by mouth 2 (two) times daily. 30 tablet 0  . fluticasone (FLONASE) 50 MCG/ACT nasal spray Place 2 sprays into both nostrils 2 (two) times daily. 18.2 mL 5  . metFORMIN (GLUCOPHAGE) 500 MG tablet Take 1 tablet (500 mg total) by mouth 2 (two) times daily with a meal. TAKE 1 TAB IN THE MORNING FOR 2 WEEKS, THEN INCREASE TO 1 TAB TWICE DAILY 60 tablet 12  . ondansetron (ZOFRAN ODT) 4 MG disintegrating tablet Take 1 tablet (4 mg total) by mouth every 8 (eight) hours as needed for nausea or vomiting. 20 tablet 0  . promethazine (PHENERGAN) 25 MG tablet Take 1 tablet (25 mg total) by mouth every 8 (eight) hours as needed for nausea or vomiting. 30 tablet 1  . propranolol (INDERAL) 20 MG tablet TAKE 1-2 TABLETS (20-40 MG TOTAL) BY MOUTH 3 (THREE) TIMES DAILY. (Patient taking differently: Take 20-40 mg by mouth 3 (three) times daily. ) 180 tablet 2  . QUEtiapine (SEROQUEL) 100 MG tablet Take 3 tablets (300 mg total) by mouth at bedtime. 270 tablet 1  . rizatriptan (MAXALT) 10 MG tablet Take 1 tablet (10 mg total) by mouth as needed for migraine. May repeat in 2 hours if needed 20 tablet 1  . [DISCONTINUED] naproxen (NAPROSYN) 375 MG tablet Take 1 tablet (375 mg total) by mouth 2 (two) times daily with a meal. 360 tablet 0   No current facility-administered medications on file prior to visit.    ALLERGIES: Allergies  Allergen Reactions  . Cymbalta [Duloxetine Hcl] Nausea And Vomiting    FAMILY HISTORY: Family History  Problem Relation Age of Onset  . Heart disease Other   .  Diabetes Father 37       type 2  . Thyroid disease Father   . Brain cancer Father   . Cancer Father        tumor of the brain cancer  . Migraines Mother   . Kidney disease Mother   . Colon polyps Mother   . Hyperlipidemia Other        great grandparents    SOCIAL HISTORY: Social History   Socioeconomic History  . Marital status: Married    Spouse name: Not on file  . Number of children: 1  . Years of education: Not on file  . Highest education level: Not on file  Occupational History  . Occupation: clerck     Comment: [employer]  Tobacco Use  . Smoking status: Former Smoker    Years: 13.00    Types: Cigarettes  . Smokeless tobacco: Never Used  Vaping Use  . Vaping Use: Some days  . Devices: unsure  Substance and Sexual Activity  . Alcohol use: Not Currently  . Drug use: Never  . Sexual activity: Yes  Partners: Male    Birth control/protection: Pill    Comment: vasectomy   Other Topics Concern  . Not on file  Social History Narrative   Lives home with husband,  4 children.  Works for [employer]. Education college.   Social Determinants of Health   Financial Resource Strain:   . Difficulty of Paying Living Expenses: Not on file  Food Insecurity:   . Worried About Programme researcher, broadcasting/film/video in the Last Year: Not on file  . Ran Out of Food in the Last Year: Not on file  Transportation Needs:   . Lack of Transportation (Medical): Not on file  . Lack of Transportation (Non-Medical): Not on file  Physical Activity:   . Days of Exercise per Week: Not on file  . Minutes of Exercise per Session: Not on file  Stress:   . Feeling of Stress : Not on file  Social Connections:   . Frequency of Communication with Friends and Family: Not on file  . Frequency of Social Gatherings with Friends and Family: Not on file  . Attends Religious Services: Not on file  . Active Member of Clubs or Organizations: Not on file  . Attends Banker Meetings: Not on file  . Marital Status:  Not on file  Intimate Partner Violence:   . Fear of Current or Ex-Partner: Not on file  . Emotionally Abused: Not on file  . Physically Abused: Not on file  . Sexually Abused: Not on file    PHYSICAL EXAM: Blood pressure 99/67, pulse 85, height 5\' 4"  (1.626 m), weight 191 lb 12.8 oz (87 kg), SpO2 97 %. General: No acute distress.  Patient appears well-groomed.   Head:  Normocephalic/atraumatic Eyes:  fundi examined but not visualized Neck: supple, no paraspinal tenderness, full range of motion Back: No paraspinal tenderness Heart: regular rate and rhythm Lungs: Clear to auscultation bilaterally. Vascular: No carotid bruits. Neurological Exam: Mental status: alert and oriented to person, place, and time, recent and remote memory intact, fund of knowledge intact, attention and concentration intact, speech fluent and not dysarthric, language intact. Cranial nerves: CN I: not tested CN II: pupils equal, round and reactive to light, visual fields intact CN III, IV, VI:  full range of motion, no nystagmus, no ptosis CN V: facial sensation intact CN VII: upper and lower face symmetric CN VIII: hearing intact CN IX, X: gag intact, uvula midline CN XI: sternocleidomastoid and trapezius muscles intact CN XII: tongue midline Bulk & Tone: normal, no fasciculations. Motor:  5/5 throughout  Sensation: temperature and vibration sensation intact.  Deep Tendon Reflexes:  2+ throughout, toes downgoing.   Finger to nose testing:  Without dysmetria.   Heel to shin:  Without dysmetria.   Gait:  Normal station and stride.  Able to turn and tandem walk. Romberg negative.  IMPRESSION: Chronic migraine Migraine with aura Rebound headache  PLAN: 1.  Start Qulipta 60mg  daily for migraine prevention 2.  She will try samples of Ubrelvy 100mg  for rescue.  If effective, she will contact for script.  Continue Zofran for nausea. 3.  Stop Excedrin 4.  Limit use of pain relievers to no more than 2  days out of week to prevent risk of rebound or medication-overuse headache. 5.  Keep headache diary 6.  Follow up 6 months  Thank you for allowing me to take part in the care of this patient.  , DO  CC:  , MD  [place], NP

## 2020-01-15 ENCOUNTER — Ambulatory Visit: Payer: Federal, State, Local not specified - PPO | Admitting: Neurology

## 2020-01-15 ENCOUNTER — Other Ambulatory Visit: Payer: Self-pay

## 2020-01-15 ENCOUNTER — Encounter: Payer: Self-pay | Admitting: Neurology

## 2020-01-15 VITALS — BP 99/67 | HR 85 | Ht 64.0 in | Wt 191.8 lb

## 2020-01-15 DIAGNOSIS — G43109 Migraine with aura, not intractable, without status migrainosus: Secondary | ICD-10-CM | POA: Diagnosis not present

## 2020-01-15 DIAGNOSIS — G43709 Chronic migraine without aura, not intractable, without status migrainosus: Secondary | ICD-10-CM

## 2020-01-15 DIAGNOSIS — G4440 Drug-induced headache, not elsewhere classified, not intractable: Secondary | ICD-10-CM | POA: Diagnosis not present

## 2020-01-15 NOTE — Patient Instructions (Addendum)
°  1. Start Qulipta 60mg  daily.  Expect a call from the specialty pharmacy to confirm prescription [phone]. 2. Take [phone] 100mg  at earliest onset of headache.  May repeat dose once in 2 hours if needed.  Maximum 2 tablets in 24 hours.  If effective, contact me for prescription 3. Use Zofran for nausea. 4. STOP EXCEDRIN.  Limit use of pain relievers to no more than 2 days out of the week.  These medications include acetaminophen, NSAIDs (ibuprofen/Advil/Motrin, naproxen/Aleve, triptans (Imitrex/sumatriptan), Excedrin, and narcotics.  This will help reduce risk of rebound headaches. 5. Be aware of common food triggers:  - Caffeine:  coffee, black tea, cola, Mt. Dew  - Chocolate  - Dairy:  aged cheeses (brie, blue, cheddar, gouda, [place], provolone, [place], Swiss, etc), chocolate milk, buttermilk, sour cream, limit eggs and yogurt  - Nuts, peanut butter  - Alcohol  - Cereals/grains:  FRESH breads (fresh bagels, sourdough, doughnuts), yeast productions  - Processed/canned/aged/cured meats (pre-packaged deli meats, hotdogs)  - MSG/glutamate:  soy sauce, flavor enhancer, pickled/preserved/marinated foods  - Sweeteners:  aspartame (Equal, Nutrasweet).  Sugar and Splenda are okay  - Vegetables:  legumes (lima beans, lentils, snow peas, fava beans, pinto peans, peas, garbanzo beans), sauerkraut, onions, olives, pickles  - Fruit:  avocados, bananas, citrus fruit (orange, lemon, grapefruit), mango  - Other:  Frozen meals, macaroni and cheese 6. Routine exercise 7. Stay adequately hydrated (aim for 64 oz water daily) 8. Keep headache diary 9. Maintain proper stress management 10. Maintain proper sleep hygiene 11. Do not skip meals 12. Consider supplements:  magnesium citrate 400mg  daily, riboflavin 400mg  daily, coenzyme Q10 100mg  three times daily. 13.  Follow up 6 months.

## 2020-01-19 ENCOUNTER — Telehealth: Payer: Self-pay

## 2020-01-19 NOTE — Telephone Encounter (Signed)
Patient is calling in regards to not having a "menstrual cycle, bloating, and painful cramps".

## 2020-01-19 NOTE — Telephone Encounter (Signed)
It's ok that she's not bleeding with micronor.  Some women do not cycle with micronor.  The only side effect that I can see with the new migraine medication is constipation.  Could you see about her bowel function?  Last ultrasound was June.  Even if there is a cyst, this will resolve.  I think it's ok to monitor and see if this resolves because a cyst will.

## 2020-01-19 NOTE — Telephone Encounter (Addendum)
Last OV and PUS on 09/03/19 H/o amenorrhea  PCOS H/o uterine ablation 01/2019 per pt.   Spoke with pt. Pt states having missed cycle x last 3 months, last LMP 09/28/19 after starting Micronor Rx. Pt states also having abd bloating, and cramps over right ovary area  that she rates as 5 on pain scale. Pt takes Rx Voltaren twice daily for knee and does not take any Ibuprofen or Tylenol.  Pt states does not help with cramps. Pt states also seeing neurology on 10/29 and has new migraine medication called Qulipta Rx. Pt wanting to know if this is related to symptoms and increased migraines lately.  Pt denies any vaginal bleeding, spotting, severe abd pain.   Pt asking if needs OV or repeat PUS? Pt advised OV first with Dr Hyacinth Meeker. Pt scheduled for 11/8 at 830 am. Advised will review with Dr Hyacinth Meeker and return call with update if needs to be scheduled for PUS. Pt agreeable.   Routing to Dr Hyacinth Meeker, please advise.   Pt need OV or PUS? Thanks

## 2020-01-19 NOTE — Telephone Encounter (Signed)
Spoke with pt. Pt given update and recommendations per Dr Hyacinth Meeker. Pt states having normal regular BMs every day and is using metamucil daily as well. Pt states " not thinking its from meds" Would like to still keep OV on 11/8 for exam. Pt advised ok to keep OV.  Routing to Dr Hyacinth Meeker for update Encounter closed

## 2020-01-20 ENCOUNTER — Other Ambulatory Visit: Payer: Self-pay

## 2020-01-20 ENCOUNTER — Telehealth: Payer: Self-pay | Admitting: Neurology

## 2020-01-20 NOTE — Telephone Encounter (Signed)
Hey, can you check on status of this, thanks

## 2020-01-20 NOTE — Telephone Encounter (Signed)
Patient called in stating her Marco Collie is working, she has not heard anything from the special pharmacy.

## 2020-01-20 NOTE — Telephone Encounter (Signed)
Needs Qulipta 60mg  sent to pharmacy, please advise and let patient know

## 2020-01-20 NOTE — Telephone Encounter (Signed)
No answer at [id number]

## 2020-01-20 NOTE — Telephone Encounter (Signed)
I wasn't told anything about this. Is Abvie the name of the medication? I don't see this under her medication list.

## 2020-01-21 ENCOUNTER — Encounter: Payer: Self-pay | Admitting: Neurology

## 2020-01-21 ENCOUNTER — Other Ambulatory Visit: Payer: Self-pay | Admitting: Neurology

## 2020-01-21 MED ORDER — UBRELVY 100 MG PO TABS: 1.0000 | ORAL_TABLET | ORAL | 5 refills | Status: DC | PRN

## 2020-01-21 NOTE — Telephone Encounter (Signed)
Submitted PA through Cover My Meds for Patient's Jillian Reyes; awaiting to hear from [facility] if PA is required for new Qulipta medication.   Neysa Bonito, can you see if Zenon Mayo has already sent fax for Arnoldo Lenis to that specialty pharm? Or leave msg for her to check when she gets back in.

## 2020-01-21 NOTE — Progress Notes (Addendum)
Jillian Reyes (Key: [id number]) Bernita Raisin 100MG  tablets   Form Program (FEP) Prior Authorization Form  Plan Contact [phone] phone Sent to Plan  Received ERROR msg to call ins for this PA  11/4- called ins and medication does require a tier except form for this so Rep was faxing me the forms to fill out and send back. 11/5- faxed forms back to them for tier exception.

## 2020-01-21 NOTE — Telephone Encounter (Signed)
I sent in a prescription for Ubrelvy.  However, the medication that the patient is talking about is Qulipta 60mg  daily.  It is a new medication that is not yet in Epic.  However, we fax the prescription to a specialty pharmacy.  I do not know where keeps the forms.  However, we can contact the rep, Zenon Mayo, at [phone] for clarification/help.

## 2020-01-21 NOTE — Telephone Encounter (Signed)
I do not want to switch to topiramate. I sent a prescription for Bernita Raisin, which needs prior authorization.  This will need to be addressed to the same rep along with the Qulipta.

## 2020-01-21 NOTE — Telephone Encounter (Signed)
Please see and advise, when you return to office.

## 2020-01-25 ENCOUNTER — Ambulatory Visit: Payer: Self-pay | Admitting: Obstetrics & Gynecology

## 2020-01-25 ENCOUNTER — Telehealth: Payer: Self-pay

## 2020-01-25 NOTE — Telephone Encounter (Signed)
Pt advised to call Qulipta in my chart message. Advised pt I spoke to the Rep and they will check on it as well. We have samples if needs.

## 2020-01-27 NOTE — Progress Notes (Signed)
Approval letter received. Pt is approved to get Qulipta from the company at no cost to her for 24 months or until her insurance covers.     Pt advised of letter, and the company call her with the delivery date.

## 2020-02-01 NOTE — Progress Notes (Signed)
11/11- received denial of Ubrelvy from ins due to patient not failing alternative. Called FEP and Nurtec needs to be tried and failed first. Sent msg to [place] about this.

## 2020-02-01 NOTE — Progress Notes (Signed)
Spoke to the pt, Pt states she was aware it was denied.   Pt able to get the Ubrelvy through the company for a lower co pay.

## 2020-02-19 ENCOUNTER — Encounter: Payer: Self-pay | Admitting: Family Medicine

## 2020-02-25 ENCOUNTER — Ambulatory Visit: Payer: Federal, State, Local not specified - PPO | Admitting: Neurology

## 2020-02-29 ENCOUNTER — Other Ambulatory Visit: Payer: Self-pay | Admitting: Family Medicine

## 2020-02-29 DIAGNOSIS — L309 Dermatitis, unspecified: Secondary | ICD-10-CM

## 2020-03-27 DIAGNOSIS — Z20828 Contact with and (suspected) exposure to other viral communicable diseases: Secondary | ICD-10-CM | POA: Diagnosis not present

## 2020-04-07 DIAGNOSIS — L2084 Intrinsic (allergic) eczema: Secondary | ICD-10-CM | POA: Diagnosis not present

## 2020-04-07 DIAGNOSIS — L718 Other rosacea: Secondary | ICD-10-CM | POA: Diagnosis not present

## 2020-04-19 ENCOUNTER — Telehealth: Payer: Federal, State, Local not specified - PPO | Admitting: Family Medicine

## 2020-04-20 ENCOUNTER — Encounter: Payer: Self-pay | Admitting: Family Medicine

## 2020-04-20 ENCOUNTER — Telehealth (INDEPENDENT_AMBULATORY_CARE_PROVIDER_SITE_OTHER): Payer: Federal, State, Local not specified - PPO | Admitting: Family Medicine

## 2020-04-20 ENCOUNTER — Other Ambulatory Visit: Payer: Self-pay

## 2020-04-20 DIAGNOSIS — Z20822 Contact with and (suspected) exposure to covid-19: Secondary | ICD-10-CM | POA: Diagnosis not present

## 2020-04-20 DIAGNOSIS — U071 COVID-19: Secondary | ICD-10-CM | POA: Diagnosis not present

## 2020-04-20 MED ORDER — PREDNISONE 20 MG PO TABS: 40.0000 mg | ORAL_TABLET | Freq: Every day | ORAL | 0 refills | Status: AC

## 2020-04-20 MED ORDER — BENZONATATE 100 MG PO CAPS: 100.0000 mg | ORAL_CAPSULE | Freq: Three times a day (TID) | ORAL | 0 refills | Status: DC | PRN

## 2020-04-20 MED ORDER — ALBUTEROL SULFATE HFA 108 (90 BASE) MCG/ACT IN AERS: 2.0000 | INHALATION_SPRAY | Freq: Four times a day (QID) | RESPIRATORY_TRACT | 2 refills | Status: DC | PRN

## 2020-04-20 NOTE — Progress Notes (Addendum)
Chief Complaint  Patient presents with  . Covid Positive    Tested positive 04/20/20 Symptoms started 04/19/2020    [name] here for URI complaints. Due to COVID-19 pandemic, we are interacting via web portal for an electronic face-to-face visit. I verified patient's ID using 2 identifiers. Patient agreed to proceed with visit via this method. Patient is at home, I am at office. Patient and I are present for visit.   Duration: 2 days  Associated symptoms: sinus congestion, sinus pain, wheezing and cough Denies: rhinorrhea, itchy watery eyes, ear pain, ear drainage, sore throat, shortness of breath, myalgia and fevers, N/V/D Treatment to date: left over prednisone Sick contacts: Yes - sick contacts at work She tested positive today for Covid. She is not vaccinated.  Past Medical History:  Diagnosis Date  . Acne   . Asthma due to environmental allergies    per pt moderate, prn inhaler  . Bipolar 1 disorder (HCC)   . Chronic back pain   . Chronic pelvic pain in female   . Dyslipidemia 02/18/2013  . Eczema   . GAD (generalized anxiety disorder)   . GERD (gastroesophageal reflux disease)   . History of abnormal cervical Pap smear   . History of chlamydia 2015  . History of gestational diabetes   . Irregular periods/menstrual cycles   . Migraines   . PCOS (polycystic ovarian syndrome)   . Vitamin D deficiency 04/10/2015  . Wears glasses    Objective No conversational dyspnea Age appropriate judgment and insight Nml affect and mood  COVID-19 - Plan: albuterol (VENTOLIN HFA) 108 (90 Base) MCG/ACT inhaler, predniSONE (DELTASONE) 20 MG tablet, benzonatate (TESSALON) 100 MG capsule  5 d pred burst, 40 mg/d, refill SABA, Tessalon perles prn.  Continue to push fluids, practice good hand hygiene, cover mouth when coughing. F/u prn. If starting to experience irreplaceable fluid loss, shaking, or shortness of breath, seek immediate care. Pt voiced understanding and agreement to the  plan.  Jilda Roche Weldon, DO 04/20/20 3:19 PM

## 2020-04-26 ENCOUNTER — Encounter: Payer: Self-pay | Admitting: Neurology

## 2020-04-26 NOTE — Progress Notes (Signed)
Laketa L[place]: [id number] help? Call us at [phone] Outcome Additional Information Required The requested medication is not covered under the [employer] formulary. These non-covered drugs have available covered options in the same therapeutic class which are listed along with information on the formulary exception process for [employer] online: <[email or web address]> The full approved drug list is available at: <[email or web address]> Drug Qulipta 60MG  tablets Form [employer] Electronic PA Form (2017 NCPDP)  Non covered drug through insurance which qualifies patient for the [place] program through [place].

## 2020-04-29 DIAGNOSIS — L2084 Intrinsic (allergic) eczema: Secondary | ICD-10-CM | POA: Diagnosis not present

## 2020-04-29 DIAGNOSIS — L718 Other rosacea: Secondary | ICD-10-CM | POA: Diagnosis not present

## 2020-05-12 ENCOUNTER — Encounter (HOSPITAL_BASED_OUTPATIENT_CLINIC_OR_DEPARTMENT_OTHER): Payer: Self-pay | Admitting: Obstetrics & Gynecology

## 2020-05-12 ENCOUNTER — Other Ambulatory Visit: Payer: Self-pay

## 2020-05-12 ENCOUNTER — Ambulatory Visit (HOSPITAL_BASED_OUTPATIENT_CLINIC_OR_DEPARTMENT_OTHER): Payer: Federal, State, Local not specified - PPO | Admitting: Obstetrics & Gynecology

## 2020-05-12 VITALS — BP 105/73 | HR 72 | Ht 64.0 in | Wt 186.2 lb

## 2020-05-12 DIAGNOSIS — N921 Excessive and frequent menstruation with irregular cycle: Secondary | ICD-10-CM

## 2020-05-12 DIAGNOSIS — E282 Polycystic ovarian syndrome: Secondary | ICD-10-CM | POA: Diagnosis not present

## 2020-05-12 NOTE — Patient Instructions (Signed)
Endometrial Ablation Endometrial ablation is a procedure that destroys the thin inner layer of the lining of the uterus (endometrium). This procedure may be done:  To stop heavy menstrual periods.  To stop bleeding that is causing anemia.  To control irregular bleeding.  To treat bleeding caused by small tumors (fibroids) in the endometrium. This procedure is often done as an alternative to major surgery, such as removal of the uterus and cervix (hysterectomy). As a result of this procedure:  You may not be able to have children. However, if you have not yet gone through menopause: ? You may still have a small chance of getting pregnant. ? You will need to use a reliable method of birth control after the procedure to prevent pregnancy.  You may stop having a menstrual period, or you may have only a small amount of bleeding during your period. Menstruation may return several years after the procedure. Tell a health care provider about:  Any allergies you have.  All medicines you are taking, including vitamins, herbs, eye drops, creams, and over-the-counter medicines.  Any problems you or family members have had with the use of anesthetic medicines.  Any blood disorders you have.  Any surgeries you have had.  Any medical conditions you have.  Whether you are pregnant or may be pregnant. What are the risks? Generally, this is a safe procedure. However, problems may occur, including:  A hole (perforation) in the uterus or bowel.  Infection in the uterus, bladder, or vagina.  Bleeding.  Allergic reaction to medicines.  Damage to nearby structures or organs.  An air bubble in the lung (air embolus).  Problems with pregnancy.  Failure of the procedure.  Decreased ability to diagnose cancer in the endometrium. Scar tissue forms after the procedure, making it more difficult to get a sample of the uterine lining. What happens before the procedure? Medicines Ask your health  care provider about:  Changing or stopping your regular medicines. This is especially important if you take diabetes medicines or blood thinners.  Taking medicines such as aspirin and ibuprofen. These medicines can thin your blood. Do not take these medicines before your procedure if your doctor tells you not to take them.  Taking over-the-counter medicines, vitamins, herbs, and supplements. Tests  You will have tests of your endometrium to make sure there are no precancerous cells or cancer cells present.  You may have an ultrasound of the uterus. General instructions  Do not use any products that contain nicotine or tobacco for at least 4 weeks before the procedure. These include cigarettes, chewing tobacco, and vaping devices, such as e-cigarettes. If you need help quitting, ask your health care provider.  You may be given medicines to thin the endometrium.  Ask your health care provider what steps will be taken to help prevent infection. These steps may include: ? Removing hair at the surgery site. ? Washing skin with a germ-killing soap. ? Taking antibiotic medicine.  Plan to have a responsible adult take you home from the hospital or clinic.  Plan to have a responsible adult care for you for the time you are told after you leave the hospital or clinic. This is important. What happens during the procedure?  You will lie on an exam table with your feet and legs supported as in a pelvic exam.  An IV will be inserted into one of your veins.  You will be given a medicine to help you relax (sedative).  A surgical tool with   a light and camera (resectoscope) will be inserted into your vagina and moved into your uterus. This allows your surgeon to see inside your uterus.  Endometrial tissue will be destroyed and removed, using one of the following methods: ? Radiofrequency. This uses an electrical current to destroy the endometrium. ? Cryotherapy. This uses extreme cold to freeze  the endometrium. ? Heated fluid. This uses a heated salt and water (saline) solution to destroy the endometrium. ? Microwave. This uses high-energy microwaves to heat up the endometrium and destroy it. ? Thermal balloon. This involves inserting a catheter with a balloon tip into the uterus. The balloon tip is filled with heated fluid to destroy the endometrium. The procedure may vary among health care providers and hospitals.   What happens after the procedure?  Your blood pressure, heart rate, breathing rate, and blood oxygen level will be monitored until you leave the hospital or clinic.  You may have vaginal bleeding for 4-6 weeks after the procedure. You may also have: ? Cramps. ? A thin, watery vaginal discharge that is light pink or brown. ? A need to urinate more than usual. ? Nausea.  If you were given a sedative during the procedure, it can affect you for several hours. Do not drive or operate machinery until your health care provider says that it is safe.  Do not have sex or insert anything into your vagina until your health care provider says it is safe. Summary  Endometrial ablation is done to treat many causes of heavy menstrual bleeding. The procedure destroys the thin inner layer of the lining of the uterus (endometrium).  This procedure is often done as an alternative to major surgery, such as removal of the uterus and cervix (hysterectomy).  Plan to have a responsible adult take you home from the hospital or clinic. This information is not intended to replace advice given to you by your health care provider. Make sure you discuss any questions you have with your health care provider. Document Revised: 09/24/2019 Document Reviewed: 09/24/2019 Elsevier Patient Education  2021 Elsevier Inc.       

## 2020-05-12 NOTE — Progress Notes (Unsigned)
GYNECOLOGY  VISIT  CC:   Irregular menses/heavy cycles  HPI: 30 y.o. G31P1011 Married Other or two or more races female here for discussion of continued issues with irregular cycles.  She can skip a few months at a time and then when she has a cycle, it is very heavy and long.  Typically has a lot of pain right before her cycle starts.   Pt cannot use estrogen containing OCPs due to migraines with aura.  She has tried Pharmacist, hospital in the past and had side effects.  She has used a Mirena IUD and did not do well with this due to side effects.  She used Depo Provera in the past and really felt this "messed up" her bleeding.  Does not want to try any more oral hormonal therapy.    Most recent ultrasound was 09/03/2019 showing: UTERUS: 7.2 x 4.3 x 3.5 cm EMS: 3.5 mm ADNEXA: Left ovary: 3.6 x 2.6 x 1.9 cm                  Right ovary: 3.8 x 2.8 x 2.2 cm.  Small follicles noted on both ovaries.  Normal blood flow is noted to the ovaries as well. CUL DE SAC: No free fluid  Pt has undergone a laparoscopy that did not show endometriosis.  She does have intermittent pelvic pain.  She did see PT as well.  She does have ovarian cysts from time to time on ultrasound and she thinks this contributes to her intermittent pelvic pain.  She is aware I do NOT recommend ovary removal for anyone her age without any serious pathology.  She really wants something done to help with her bleeding.  She does not want any other hormonal therapy.  We discussed endometrial ablation (husband has undergone vasectomy) and hysterectomy.  I really feel hysterectomy is more than she needs at this time.  Pt is aware that she should not have her ovaries removed and will likely continue to have some cyst formation from time to time.  I cannot stop this without using combination OCPs and this is a contraindication for her.  Endometrial ablation risks reviewed including Procedure discussed with patient.  Recovery and pain management discussed.   Risks discussed including but not limited to bleeding, rare risk of transfusion, infection, 1% risk of uterine perforation with risks of fluid deficit causing cardiac arrythmia, cerebral swelling and/or need to stop procedure early.  Fluid emboli and rare risk of death discussed.  DVT/PE, rare risk of risk of bowel/bladder/ureteral/vascular injury.  I will sent tissue to pathology and if pathology abnormal she may need additional treatment.  All questions answered.    GYNECOLOGIC HISTORY: Patient's last menstrual period was 05/12/2020 (exact date).  Patient Active Problem List   Diagnosis Date Noted  . PCOS (polycystic ovarian syndrome) 10/16/2019  . Grief reaction 05/27/2019  . Painful coitus, female 12/05/2018  . Tachycardia 07/14/2018  . Hypocalcemia 12/22/2016  . Right hand pain 12/22/2016  . Bilateral calf pain 12/21/2016  . Right knee pain 12/21/2016  . Closed nondisplaced fracture of distal phalanx of right ring finger 12/07/2016  . Hemorrhoids 12/01/2016  . History of hidradenitis suppurativa 10/14/2016  . Abdominal pain 10/12/2016  . Left ankle sprain 04/10/2016  . Folliculitis 04/10/2016  . Bilateral neck pain 03/16/2016  . Preventative health care 04/10/2015  . Vitamin D deficiency 04/10/2015  . Dysuria 01/10/2014  . Allergic rhinitis 11/30/2013  . Migraine without aura and without status migrainosus, not intractable 11/17/2013  .  Encounter for preconception consultation 07/27/2013  . Insomnia 07/02/2013  . Tendinopathy of rotator cuff 03/08/2013  . Dyslipidemia 02/18/2013  . Contraceptive management 01/06/2012  . Eczema 01/06/2012  . Acne 01/06/2012  . Hx of gestational diabetes mellitus, not currently pregnant   . Mid back pain   . Allergic state   . Abnormal cells of cervix   . Anxiety as acute reaction to exceptional stress 05/01/2011  . Bipolar 1 disorder (HCC) 07/05/2010    Past Medical History:  Diagnosis Date  . Acne   . Asthma due to environmental  allergies    per pt moderate, prn inhaler  . Bipolar 1 disorder (HCC)   . Chronic back pain   . Chronic pelvic pain in female   . Dyslipidemia 02/18/2013  . Eczema   . GAD (generalized anxiety disorder)   . GERD (gastroesophageal reflux disease)   . History of abnormal cervical Pap smear   . History of chlamydia 2015  . History of gestational diabetes   . Irregular periods/menstrual cycles   . Migraines   . PCOS (polycystic ovarian syndrome)   . Vitamin D deficiency 04/10/2015  . Wears glasses     Past Surgical History:  Procedure Laterality Date  . INCISION AND DRAINAGE Right 01/12/2019   Procedure: INCISION AND DRAINAGE OF LESION;  Surgeon: Jerene Bears, MD;  Location: [facility];  Service: Gynecology;  Laterality: Right;  . LAPAROSCOPY N/A 01/12/2019   Procedure: LAPAROSCOPY DIAGNOSTIC; LYSIS OF ADHESIONS;  Surgeon: Jerene Bears, MD;  Location: [facility];  Service: Gynecology;  Laterality: N/A;  possible treatement of endometriosis  . NO PAST SURGERIES    . WISDOM TOOTH EXTRACTION  2016    MEDS:   Current Outpatient Medications on File Prior to Visit  Medication Sig Dispense Refill  . albuterol (VENTOLIN HFA) 108 (90 Base) MCG/ACT inhaler Inhale 2 puffs into the lungs every 6 (six) hours as needed for wheezing or shortness of breath. 18 g 2  . alprazolam (XANAX) 2 MG tablet Take 1 tablet (2 mg total) by mouth at bedtime as needed. for sleep. 30 tablet 3  . clobetasol cream (TEMOVATE) 0.05 % APPLY TOPICALLY DAILY AS NEEDED. ECZEMA 60 g 3  . diclofenac (VOLTAREN) 75 MG EC tablet Take 1 tablet (75 mg total) by mouth 2 (two) times daily. 30 tablet 0  . doxycycline (VIBRAMYCIN) 50 MG capsule Take 50 mg by mouth daily.    . DUPIXENT 300 MG/2ML SOPN Inject 300 mg SubQ every other week    . EUCRISA 2 % OINT Apply to affected areas daily for maintenance    . fluticasone (FLONASE) 50 MCG/ACT nasal spray Place 2 sprays into both nostrils 2 (two) times  daily. 18.2 mL 5  . metFORMIN (GLUCOPHAGE) 500 MG tablet Take 1 tablet (500 mg total) by mouth 2 (two) times daily with a meal. TAKE 1 TAB IN THE MORNING FOR 2 WEEKS, THEN INCREASE TO 1 TAB TWICE DAILY 60 tablet 12  . Ubrogepant (UBRELVY) 100 MG TABS Take 1 tablet by mouth as needed (May repeat 1 tablet in 2 hours.  Maximum 2 tablets in 24 hours.). 10 tablet 5  . [DISCONTINUED] naproxen (NAPROSYN) 375 MG tablet Take 1 tablet (375 mg total) by mouth 2 (two) times daily with a meal. 360 tablet 0   No current facility-administered medications on file prior to visit.    ALLERGIES: Cymbalta [duloxetine hcl]  Family History  Problem Relation Age of Onset  .  Heart disease Other   . Diabetes Father 28       type 2  . Thyroid disease Father   . Brain cancer Father   . Cancer Father        tumor of the brain cancer  . Migraines Mother   . Kidney disease Mother   . Colon polyps Mother   . Hyperlipidemia Other        great grandparents    SH:  Married, non smoker  Review of Systems  Constitutional: Negative.   Gastrointestinal: Negative.   Genitourinary: Positive for menstrual problem and pelvic pain.    PHYSICAL EXAMINATION:    BP 105/73   Pulse 72   Ht 5\' 4"  (1.626 m)   Wt 186 lb 3.2 oz (84.5 kg)   LMP 05/12/2020 (Exact Date)   BMI 31.96 kg/m     General appearance: alert, cooperative and appears stated age No other physical exam performed today  Assessment/Plan: 1. Menorrhagia with irregular cycle - after reviewing options for treatment, she is ready to proceed with endometrial ablation and hysteroscopy.  Will proceed with scheduling.  2. PCOS (polycystic ovarian syndrome)    23 minutes of total time was spent for this patient encounter, including preparation, face-to-face counseling with the patient and coordination of care, and documentation of the encounter.

## 2020-05-24 ENCOUNTER — Telehealth (HOSPITAL_BASED_OUTPATIENT_CLINIC_OR_DEPARTMENT_OTHER): Payer: Self-pay | Admitting: Obstetrics & Gynecology

## 2020-05-24 NOTE — Telephone Encounter (Signed)
Patient called and wanted to know when someone was going call her about scheduling her for surgery.

## 2020-05-26 ENCOUNTER — Telehealth: Payer: Self-pay | Admitting: *Deleted

## 2020-05-26 ENCOUNTER — Other Ambulatory Visit: Payer: Self-pay | Admitting: Family Medicine

## 2020-05-26 MED ORDER — LAMOTRIGINE 84 X 25 MG & 14X100 MG PO KIT: PACK | ORAL | 0 refills | Status: DC

## 2020-05-26 MED ORDER — LAMOTRIGINE 200 MG PO TABS
200.0000 mg | ORAL_TABLET | Freq: Every day | ORAL | 0 refills | Status: DC
Start: 2020-05-26 — End: 2020-06-28

## 2020-05-26 NOTE — Telephone Encounter (Signed)
She can restart Lamictal but she has to restart at 25 mg x 2 week then 50 mg x 2 week then 100 mg x 2 weeks and then 200 mg daily. I sent in a starter pack and a refill on her 200 mg dose. Please let her know and make sure she has a follow up appointment

## 2020-05-26 NOTE — Telephone Encounter (Signed)
We received a request from pharmacy for lamotrigine 200mg .    We had d/c it because switched her to Seroquel.  Spoke with patient and she stated that the Seroquel makes her feel like she is .  She would like to switch back to lamotrigine.

## 2020-05-30 ENCOUNTER — Encounter (HOSPITAL_BASED_OUTPATIENT_CLINIC_OR_DEPARTMENT_OTHER): Payer: Self-pay | Admitting: *Deleted

## 2020-05-30 NOTE — Telephone Encounter (Signed)
Pt is aware of instructions.

## 2020-06-16 ENCOUNTER — Other Ambulatory Visit (HOSPITAL_BASED_OUTPATIENT_CLINIC_OR_DEPARTMENT_OTHER): Payer: Self-pay | Admitting: Obstetrics & Gynecology

## 2020-06-20 ENCOUNTER — Other Ambulatory Visit: Payer: Self-pay

## 2020-06-20 ENCOUNTER — Encounter (HOSPITAL_BASED_OUTPATIENT_CLINIC_OR_DEPARTMENT_OTHER): Payer: Self-pay | Admitting: Obstetrics & Gynecology

## 2020-06-20 ENCOUNTER — Ambulatory Visit (INDEPENDENT_AMBULATORY_CARE_PROVIDER_SITE_OTHER): Payer: Federal, State, Local not specified - PPO | Admitting: Obstetrics & Gynecology

## 2020-06-20 VITALS — BP 120/71 | HR 84 | Ht 64.5 in | Wt 186.0 lb

## 2020-06-20 DIAGNOSIS — E559 Vitamin D deficiency, unspecified: Secondary | ICD-10-CM

## 2020-06-20 DIAGNOSIS — L659 Nonscarring hair loss, unspecified: Secondary | ICD-10-CM

## 2020-06-20 DIAGNOSIS — E282 Polycystic ovarian syndrome: Secondary | ICD-10-CM

## 2020-06-20 DIAGNOSIS — N921 Excessive and frequent menstruation with irregular cycle: Secondary | ICD-10-CM

## 2020-06-21 ENCOUNTER — Other Ambulatory Visit (HOSPITAL_BASED_OUTPATIENT_CLINIC_OR_DEPARTMENT_OTHER)
Admission: RE | Admit: 2020-06-21 | Discharge: 2020-06-21 | Disposition: A | Discharge status: Home / Self Care | Payer: Federal, State, Local not specified - PPO | Source: Ambulatory Visit | Attending: Obstetrics & Gynecology | Admitting: Obstetrics & Gynecology

## 2020-06-21 ENCOUNTER — Other Ambulatory Visit (HOSPITAL_BASED_OUTPATIENT_CLINIC_OR_DEPARTMENT_OTHER): Payer: Federal, State, Local not specified - PPO

## 2020-06-21 DIAGNOSIS — R739 Hyperglycemia, unspecified: Secondary | ICD-10-CM

## 2020-06-21 DIAGNOSIS — E559 Vitamin D deficiency, unspecified: Secondary | ICD-10-CM | POA: Diagnosis not present

## 2020-06-21 DIAGNOSIS — R000 Tachycardia, unspecified: Secondary | ICD-10-CM

## 2020-06-21 DIAGNOSIS — R109 Unspecified abdominal pain: Secondary | ICD-10-CM | POA: Diagnosis not present

## 2020-06-21 DIAGNOSIS — M25572 Pain in left ankle and joints of left foot: Secondary | ICD-10-CM | POA: Insufficient documentation

## 2020-06-21 DIAGNOSIS — E785 Hyperlipidemia, unspecified: Secondary | ICD-10-CM | POA: Diagnosis not present

## 2020-06-21 DIAGNOSIS — L659 Nonscarring hair loss, unspecified: Secondary | ICD-10-CM | POA: Diagnosis not present

## 2020-06-21 LAB — COMPREHENSIVE METABOLIC PANEL
ALT: 13 U/L (ref 0–44)
AST: 14 U/L — ABNORMAL LOW (ref 15–41)
Albumin: 4.3 g/dL (ref 3.5–5.0)
Alkaline Phosphatase: 56 U/L (ref 38–126)
Anion gap: 8 (ref 5–15)
BUN: 8 mg/dL (ref 6–20)
CO2: 25 mmol/L (ref 22–32)
Calcium: 8.8 mg/dL — ABNORMAL LOW (ref 8.9–10.3)
Chloride: 105 mmol/L (ref 98–111)
Creatinine, Ser: 0.78 mg/dL (ref 0.44–1.00)
GFR, Estimated: >60 mL/min (ref >60)
Glucose, Bld: 84 mg/dL (ref 70–99)
Potassium: 3.6 mmol/L (ref 3.5–5.1)
Sodium: 138 mmol/L (ref 135–145)
Total Bilirubin: 0.5 mg/dL (ref 0.3–1.2)
Total Protein: 6.8 g/dL (ref 6.5–8.1)

## 2020-06-21 LAB — CBC
HCT: 40.4 % (ref 36.0–46.0)
Hemoglobin: 13.8 g/dL (ref 12.0–15.0)
MCH: 30.9 pg (ref 26.0–34.0)
MCHC: 34.2 g/dL (ref 30.0–36.0)
MCV: 90.6 fL (ref 80.0–100.0)
Platelets: 253 10*3/uL (ref 150–400)
RBC: 4.46 MIL/uL (ref 3.87–5.11)
RDW: 12.8 % (ref 11.5–15.5)
WBC: 8 10*3/uL (ref 4.0–10.5)
nRBC: 0 % (ref 0.0–0.2)

## 2020-06-21 LAB — VITAMIN D 25 HYDROXY (VIT D DEFICIENCY, FRACTURES): Vit D, 25-Hydroxy: 22.3 ng/mL — ABNORMAL LOW (ref 30–100)

## 2020-06-21 LAB — FERRITIN: Ferritin: 37 ng/mL (ref 11–307)

## 2020-06-22 LAB — LIPID PANEL
Cholesterol: 137 mg/dL (ref 0–200)
HDL: 46 mg/dL
LDL Cholesterol: 68 mg/dL (ref 0–99)
Total CHOL/HDL Ratio: 3 ratio
Triglycerides: 117 mg/dL
VLDL: 23 mg/dL (ref 0–40)

## 2020-06-22 LAB — HEMOGLOBIN A1C
Hgb A1c MFr Bld: 4.9 % (ref 4.8–5.6)
Mean Plasma Glucose: 93.93 mg/dL

## 2020-06-22 LAB — DHEA-SULFATE: DHEA-SO4: 175 ug/dL (ref 84.8–378.0)

## 2020-06-22 LAB — TESTOSTERONE: Testosterone: 36 ng/dL (ref 13–71)

## 2020-06-22 LAB — TSH: TSH: 1.313 u[IU]/mL (ref 0.350–4.500)

## 2020-06-22 NOTE — Progress Notes (Signed)
30 y.o. G47P1011 Married Other or two or more races female here for discussion of upcoming procedure.  Hysteroscopy with D&C and endometrial ablation planned due to menorrhagia with irregular cycles.  Pre-op evaluation thus far has included ultrasound obtained 08/2019 showing: UTERUS: 7.2 x 4.3 x 3.5 cm EMS: 3.5 mm ADNEXA: Left ovary: 3.6 x 2.6 x 1.9 cm                  Right ovary: 3.8 x 2.8 x 2.2 cm.  Small follicles noted on both ovaries.  Normal blood flow is noted to the ovaries as well. CUL DE SAC: No free fluid  Pt cannot use OCPs due to migraines with aura.  She has failed POPs due to side effects.  She did have a progesterone IUD which also caused side effects so she had it removed.  She used Depo Provera and also had side effects.  She does not want any more hormonal therapy.  Pt and I have discussed additional options for treatment of bleeding including endometrial ablation and hysterectomy.  She is not interested in hysterectomy at this time.  Husband has undergone a vasectomy.  She is sure that she does not want any more child bearing so has decided to proceed with endometrial ablation.  She is aware that this will prevent future child bearing so if she and spouse every break up, she will need to proceed with treatment for pregnancy prevention.     Procedure discussed with patient.  Recovery and pain management discussed.  Risks discussed including but not limited to bleeding, rare risk of transfusion, infection, 1% risk of uterine perforation with risks of fluid deficit causing cardiac arrythmia, cerebral swelling and/or need to stop procedure early.  Fluid emboli and rare risk of death discussed.  DVT/PE, rare risk of risk of bowel/bladder/ureteral/vascular injury.  Patient aware if pathology abnormal she may need additional treatment.  All questions answered.    Separately, pt is concerned about hair loss.  Has a picture of what she sees in the shower.  Feels this is abnormal.  Does have a  lot of small hairs that are growing.  Does have stressors from the last year so aware this could contribute.  Would like some blood work if appropriate.  May need hair loss specialist.  Can take months for appt so if lab work is all normal, will need to make referral at that time.  Ob Hx:         Sexually active: Yes.   Birth control: vasectomy Last pap: 12/09/2018 Last MMG: n/a Smoking: former smoker  Past Surgical History:  Procedure Laterality Date  . INCISION AND DRAINAGE Right 01/12/2019   Procedure: INCISION AND DRAINAGE OF LESION;  Surgeon: Megan Salon, MD;  Location: [facility];  Service: Gynecology;  Laterality: Right;  . LAPAROSCOPY N/A 01/12/2019   Procedure: LAPAROSCOPY DIAGNOSTIC; LYSIS OF ADHESIONS;  Surgeon: Megan Salon, MD;  Location: [facility];  Service: Gynecology;  Laterality: N/A;  possible treatement of endometriosis  . NO PAST SURGERIES    . WISDOM TOOTH EXTRACTION  2016    Past Medical History:  Diagnosis Date  . Acne   . Asthma due to environmental allergies    per pt moderate, prn inhaler  . Bipolar 1 disorder ([place])   . Chronic back pain   . Chronic pelvic pain in female   . Dyslipidemia 02/18/2013  . Eczema   . GAD (generalized anxiety disorder)   .  GERD (gastroesophageal reflux disease)   . History of abnormal cervical Pap smear   . History of chlamydia 2015  . History of gestational diabetes   . Irregular periods/menstrual cycles   . Migraines   . PCOS (polycystic ovarian syndrome)   . Vitamin D deficiency 04/10/2015  . Wears glasses     Allergies: Cymbalta [duloxetine hcl]  Current Outpatient Medications  Medication Sig Dispense Refill  . albuterol (VENTOLIN HFA) 108 (90 Base) MCG/ACT inhaler Inhale 2 puffs into the lungs every 6 (six) hours as needed for wheezing or shortness of breath. 18 g 2  . alprazolam (XANAX) 2 MG tablet Take 1 tablet (2 mg total) by mouth at bedtime as needed. for sleep. 30 tablet 3   . clobetasol cream (TEMOVATE) 0.05 % APPLY TOPICALLY DAILY AS NEEDED. ECZEMA 60 g 3  . DUPIXENT 300 MG/2ML SOPN Inject 300 mg SubQ every other week    . EUCRISA 2 % OINT Apply to affected areas daily for maintenance    . fluticasone (FLONASE) 50 MCG/ACT nasal spray Place 2 sprays into both nostrils 2 (two) times daily. 18.2 mL 5  . lamoTRIgine (LAMICTAL) 200 MG tablet Take 1 tablet (200 mg total) by mouth daily. 30 tablet 0  . Lamotrigine 84 x 25 MG & 14x100 MG KIT Take 25 mg by mouth daily for 14 days, THEN 50 mg daily for 14 days, THEN 100 mg daily for 14 days, THEN 200 mg daily. 1 kit 0  . metFORMIN (GLUCOPHAGE) 500 MG tablet Take 1 tablet (500 mg total) by mouth 2 (two) times daily with a meal. TAKE 1 TAB IN THE MORNING FOR 2 WEEKS, THEN INCREASE TO 1 TAB TWICE DAILY 60 tablet 12  . Ubrogepant (UBRELVY) 100 MG TABS Take 1 tablet by mouth as needed (May repeat 1 tablet in 2 hours.  Maximum 2 tablets in 24 hours.). 10 tablet 5   No current facility-administered medications for this visit.    ROS: Pertinent items noted in HPI and remainder of comprehensive ROS otherwise negative.  Exam:   BP 120/71   Pulse 84   Ht 5' 4.5" (1.638 m)   Wt 186 lb (84.4 kg)   BMI 31.43 kg/m   General appearance: alert and cooperative Head: Normocephalic, without obvious abnormality, atraumatic Neck: no adenopathy, supple, symmetrical, trachea midline and thyroid not enlarged, symmetric, no tenderness/mass/nodules Lungs: clear to auscultation bilaterally Heart: regular rate and rhythm, S1, S2 normal, no murmur, click, rub or gallop Abdomen: soft, non-tender; bowel sounds normal; no masses,  no organomegaly Extremities: extremities normal, atraumatic, no cyanosis or edema Skin: Skin color, texture, turgor normal. No rashes or lesions Lymph nodes: Cervical, supraclavicular, and axillary nodes normal. no inguinal nodes palpated Neurologic: Grossly normal  Chaperone, Prince Rome, present for examination.    Assessment/Plan: 1. Menorrhagia with irregular cycle - hysteroscopy, with D&C, endometrial ablation planned.  Pre op planning discussed.  All questions answered.    2. Hair loss - Ferritin; Future - TSH; Future - Testosterone; Future - DHEA-sulfate; Future  3. Vitamin D deficiency - VITAMIN D 25 Hydroxy (Vit-D Deficiency, Fractures); Future  4. PCOS (polycystic ovarian syndrome)   Pre op visit occurred today as well as discussion of hair loss and evaluation discussed.

## 2020-06-23 ENCOUNTER — Telehealth: Payer: Self-pay | Admitting: *Deleted

## 2020-06-23 ENCOUNTER — Other Ambulatory Visit: Payer: Self-pay

## 2020-06-23 ENCOUNTER — Encounter (HOSPITAL_BASED_OUTPATIENT_CLINIC_OR_DEPARTMENT_OTHER): Payer: Self-pay | Admitting: *Deleted

## 2020-06-23 MED ORDER — VITAMIN D (ERGOCALCIFEROL) 1.25 MG (50000 UNIT) PO CAPS: 50000.0000 [IU] | ORAL_CAPSULE | ORAL | 0 refills | Status: DC

## 2020-06-23 NOTE — Telephone Encounter (Signed)
Call to patient. Advised surgery location has been changed to [facility] on 07-06-20.  Encounter closed.

## 2020-06-24 LAB — ZINC: Zinc: 66 ug/dL (ref 44–115)

## 2020-06-28 ENCOUNTER — Other Ambulatory Visit: Payer: Self-pay

## 2020-06-28 ENCOUNTER — Other Ambulatory Visit: Payer: Self-pay | Admitting: Family Medicine

## 2020-06-28 ENCOUNTER — Encounter (HOSPITAL_BASED_OUTPATIENT_CLINIC_OR_DEPARTMENT_OTHER): Payer: Self-pay | Admitting: Obstetrics & Gynecology

## 2020-06-28 MED ORDER — LAMOTRIGINE 200 MG PO TABS: 200.0000 mg | ORAL_TABLET | Freq: Every day | ORAL | 2 refills | Status: DC

## 2020-06-28 NOTE — Telephone Encounter (Signed)
Pharmacy comment: Product Backordered/Unavailable:NOT AVAILABLE.

## 2020-06-28 NOTE — Progress Notes (Signed)
Spoke w/ via phone for pre-op interview--- PT Lab needs dos----  CBC, Urine preg             Lab results------ no COVID test ------ 07-04-2020 @ 1110 Arrive at ------- 0930 on 07-06-2020 NPO after MN NO Solid Food.  Clear liquids from MN until--- 0830 Med rec completed Medications to take morning of surgery ----- Flonase spray Diabetic medication ----- n/a Patient instructed to bring photo id and insurance card day of surgery Patient aware to have Driver (ride ) / caregiver    for 24 hours after surgery -- husband, Mikel Patient Special Instructions ----- asked to bring rescue inhaler dos Pre-Op special Istructions ----- n/a Patient verbalized understanding of instructions that were given at this phone interview. Patient denies shortness of breath, chest pain, fever, cough at this phone interview.

## 2020-06-29 ENCOUNTER — Other Ambulatory Visit: Payer: Self-pay | Admitting: Family Medicine

## 2020-06-29 ENCOUNTER — Encounter: Payer: Self-pay | Admitting: Family Medicine

## 2020-06-29 DIAGNOSIS — G479 Sleep disorder, unspecified: Secondary | ICD-10-CM

## 2020-06-29 DIAGNOSIS — R0683 Snoring: Secondary | ICD-10-CM

## 2020-06-29 DIAGNOSIS — G4700 Insomnia, unspecified: Secondary | ICD-10-CM

## 2020-06-29 DIAGNOSIS — R5383 Other fatigue: Secondary | ICD-10-CM

## 2020-07-04 ENCOUNTER — Other Ambulatory Visit (HOSPITAL_COMMUNITY)
Admission: RE | Admit: 2020-07-04 | Discharge: 2020-07-04 | Disposition: A | Discharge status: Home / Self Care | Payer: Federal, State, Local not specified - PPO | Source: Ambulatory Visit | Attending: Obstetrics & Gynecology | Admitting: Obstetrics & Gynecology

## 2020-07-04 DIAGNOSIS — Z7984 Long term (current) use of oral hypoglycemic drugs: Secondary | ICD-10-CM | POA: Diagnosis not present

## 2020-07-04 DIAGNOSIS — Z01812 Encounter for preprocedural laboratory examination: Secondary | ICD-10-CM | POA: Insufficient documentation

## 2020-07-04 DIAGNOSIS — Z8619 Personal history of other infectious and parasitic diseases: Secondary | ICD-10-CM | POA: Diagnosis not present

## 2020-07-04 DIAGNOSIS — E785 Hyperlipidemia, unspecified: Secondary | ICD-10-CM | POA: Diagnosis not present

## 2020-07-04 DIAGNOSIS — Z20822 Contact with and (suspected) exposure to covid-19: Secondary | ICD-10-CM | POA: Insufficient documentation

## 2020-07-04 DIAGNOSIS — Z87891 Personal history of nicotine dependence: Secondary | ICD-10-CM | POA: Diagnosis not present

## 2020-07-04 DIAGNOSIS — N920 Excessive and frequent menstruation with regular cycle: Secondary | ICD-10-CM | POA: Diagnosis not present

## 2020-07-04 DIAGNOSIS — Z833 Family history of diabetes mellitus: Secondary | ICD-10-CM | POA: Diagnosis not present

## 2020-07-04 DIAGNOSIS — E282 Polycystic ovarian syndrome: Secondary | ICD-10-CM | POA: Diagnosis not present

## 2020-07-04 DIAGNOSIS — Z79899 Other long term (current) drug therapy: Secondary | ICD-10-CM | POA: Diagnosis not present

## 2020-07-04 DIAGNOSIS — Z8249 Family history of ischemic heart disease and other diseases of the circulatory system: Secondary | ICD-10-CM | POA: Diagnosis not present

## 2020-07-04 DIAGNOSIS — Z888 Allergy status to other drugs, medicaments and biological substances status: Secondary | ICD-10-CM | POA: Diagnosis not present

## 2020-07-04 LAB — SARS CORONAVIRUS 2 (TAT 6-24 HRS): SARS Coronavirus 2: NEGATIVE

## 2020-07-06 ENCOUNTER — Ambulatory Visit (HOSPITAL_BASED_OUTPATIENT_CLINIC_OR_DEPARTMENT_OTHER)
Admission: RE | Admit: 2020-07-06 | Discharge: 2020-07-06 | Disposition: A | Discharge status: Home / Self Care | Payer: Federal, State, Local not specified - PPO | Attending: Obstetrics & Gynecology | Admitting: Obstetrics & Gynecology

## 2020-07-06 ENCOUNTER — Ambulatory Visit (HOSPITAL_BASED_OUTPATIENT_CLINIC_OR_DEPARTMENT_OTHER): Payer: Federal, State, Local not specified - PPO | Admitting: Anesthesiology

## 2020-07-06 ENCOUNTER — Other Ambulatory Visit: Payer: Self-pay

## 2020-07-06 ENCOUNTER — Encounter (HOSPITAL_BASED_OUTPATIENT_CLINIC_OR_DEPARTMENT_OTHER): Payer: Self-pay | Admitting: Obstetrics & Gynecology

## 2020-07-06 ENCOUNTER — Encounter (HOSPITAL_BASED_OUTPATIENT_CLINIC_OR_DEPARTMENT_OTHER)
Admission: RE | Disposition: A | Discharge status: Home / Self Care | Payer: Self-pay | Source: Home / Self Care | Attending: Obstetrics & Gynecology

## 2020-07-06 ENCOUNTER — Other Ambulatory Visit (HOSPITAL_COMMUNITY): Payer: Self-pay

## 2020-07-06 DIAGNOSIS — E785 Hyperlipidemia, unspecified: Secondary | ICD-10-CM | POA: Diagnosis not present

## 2020-07-06 DIAGNOSIS — Z20822 Contact with and (suspected) exposure to covid-19: Secondary | ICD-10-CM | POA: Insufficient documentation

## 2020-07-06 DIAGNOSIS — E282 Polycystic ovarian syndrome: Secondary | ICD-10-CM | POA: Insufficient documentation

## 2020-07-06 DIAGNOSIS — N921 Excessive and frequent menstruation with irregular cycle: Secondary | ICD-10-CM | POA: Diagnosis not present

## 2020-07-06 DIAGNOSIS — Z8249 Family history of ischemic heart disease and other diseases of the circulatory system: Secondary | ICD-10-CM | POA: Diagnosis not present

## 2020-07-06 DIAGNOSIS — Z888 Allergy status to other drugs, medicaments and biological substances status: Secondary | ICD-10-CM | POA: Diagnosis not present

## 2020-07-06 DIAGNOSIS — Z7984 Long term (current) use of oral hypoglycemic drugs: Secondary | ICD-10-CM | POA: Insufficient documentation

## 2020-07-06 DIAGNOSIS — N920 Excessive and frequent menstruation with regular cycle: Secondary | ICD-10-CM | POA: Insufficient documentation

## 2020-07-06 DIAGNOSIS — Z8619 Personal history of other infectious and parasitic diseases: Secondary | ICD-10-CM | POA: Diagnosis not present

## 2020-07-06 DIAGNOSIS — Z79899 Other long term (current) drug therapy: Secondary | ICD-10-CM | POA: Insufficient documentation

## 2020-07-06 DIAGNOSIS — Z833 Family history of diabetes mellitus: Secondary | ICD-10-CM | POA: Diagnosis not present

## 2020-07-06 DIAGNOSIS — E559 Vitamin D deficiency, unspecified: Secondary | ICD-10-CM | POA: Diagnosis not present

## 2020-07-06 DIAGNOSIS — Z87891 Personal history of nicotine dependence: Secondary | ICD-10-CM | POA: Diagnosis not present

## 2020-07-06 DIAGNOSIS — J309 Allergic rhinitis, unspecified: Secondary | ICD-10-CM | POA: Diagnosis not present

## 2020-07-06 HISTORY — PX: HYSTEROSCOPY WITH NOVASURE: SHX5574

## 2020-07-06 LAB — CBC
HCT: 42.9 % (ref 36.0–46.0)
Hemoglobin: 14.6 g/dL (ref 12.0–15.0)
MCH: 31.1 pg (ref 26.0–34.0)
MCHC: 34 g/dL (ref 30.0–36.0)
MCV: 91.5 fL (ref 80.0–100.0)
Platelets: 255 10*3/uL (ref 150–400)
RBC: 4.69 MIL/uL (ref 3.87–5.11)
RDW: 12.8 % (ref 11.5–15.5)
WBC: 6.5 10*3/uL (ref 4.0–10.5)
nRBC: 0 % (ref 0.0–0.2)

## 2020-07-06 LAB — POCT PREGNANCY, URINE: Preg Test, Ur: NEGATIVE

## 2020-07-06 SURGERY — HYSTEROSCOPY WITH NOVASURE
Anesthesia: General | Site: Vagina

## 2020-07-06 MED ORDER — ONDANSETRON HCL 4 MG/2ML IJ SOLN
INTRAMUSCULAR | Status: DC | PRN
  Administered 2020-07-06: 4 mg via INTRAVENOUS

## 2020-07-06 MED ORDER — POVIDONE-IODINE 10 % EX SWAB: 2.0000 "application " | Freq: Once | CUTANEOUS | Status: DC

## 2020-07-06 MED ORDER — ONDANSETRON HCL 4 MG/2ML IJ SOLN: 4.0000 mg | Freq: Once | INTRAMUSCULAR | Status: DC | PRN

## 2020-07-06 MED ORDER — OXYCODONE HCL 5 MG/5ML PO SOLN: 5.0000 mg | Freq: Once | ORAL | Status: DC | PRN

## 2020-07-06 MED ORDER — MIDAZOLAM HCL 5 MG/5ML IJ SOLN
INTRAMUSCULAR | Status: DC | PRN
  Administered 2020-07-06: 2 mg via INTRAVENOUS

## 2020-07-06 MED ORDER — FENTANYL CITRATE (PF) 100 MCG/2ML IJ SOLN: 25.0000 ug | INTRAMUSCULAR | Status: DC | PRN

## 2020-07-06 MED ORDER — KETOROLAC TROMETHAMINE 30 MG/ML IJ SOLN
INTRAMUSCULAR | Status: DC | PRN
  Administered 2020-07-06: 30 mg via INTRAVENOUS

## 2020-07-06 MED ORDER — ONDANSETRON HCL 4 MG/2ML IJ SOLN
INTRAMUSCULAR | Status: AC
  Filled 2020-07-06: qty 2

## 2020-07-06 MED ORDER — FENTANYL CITRATE (PF) 100 MCG/2ML IJ SOLN
INTRAMUSCULAR | Status: DC | PRN
  Administered 2020-07-06: 25 ug via INTRAVENOUS
  Administered 2020-07-06 (×2): 50 ug via INTRAVENOUS
  Administered 2020-07-06: 25 ug via INTRAVENOUS
  Administered 2020-07-06: 50 ug via INTRAVENOUS

## 2020-07-06 MED ORDER — FENTANYL CITRATE (PF) 100 MCG/2ML IJ SOLN
INTRAMUSCULAR | Status: AC
  Filled 2020-07-06: qty 2

## 2020-07-06 MED ORDER — EPHEDRINE SULFATE 50 MG/ML IJ SOLN
INTRAMUSCULAR | Status: DC | PRN
  Administered 2020-07-06 (×2): 10 mg via INTRAVENOUS

## 2020-07-06 MED ORDER — DEXAMETHASONE SODIUM PHOSPHATE 4 MG/ML IJ SOLN
INTRAMUSCULAR | Status: DC | PRN
  Administered 2020-07-06: 10 mg via INTRAVENOUS

## 2020-07-06 MED ORDER — LIDOCAINE-EPINEPHRINE 1 %-1:100000 IJ SOLN
INTRAMUSCULAR | Status: DC | PRN
  Administered 2020-07-06: 10 mL

## 2020-07-06 MED ORDER — KETOROLAC TROMETHAMINE 30 MG/ML IJ SOLN
INTRAMUSCULAR | Status: AC
  Filled 2020-07-06: qty 1

## 2020-07-06 MED ORDER — HYDROCODONE-ACETAMINOPHEN 5-325 MG PO TABS
1.0000 | ORAL_TABLET | Freq: Four times a day (QID) | ORAL | 0 refills | Status: DC | PRN
  Filled 2020-07-06: qty 15, 2d supply, fill #0

## 2020-07-06 MED ORDER — EPHEDRINE 5 MG/ML INJ
INTRAVENOUS | Status: AC
  Filled 2020-07-06: qty 10

## 2020-07-06 MED ORDER — ACETAMINOPHEN 160 MG/5ML PO SOLN
325.0000 mg | ORAL | Status: DC | PRN
Start: 2020-07-06 — End: 2020-07-06

## 2020-07-06 MED ORDER — SODIUM CHLORIDE 0.9 % IR SOLN
Status: DC | PRN
  Administered 2020-07-06: 3000 mL

## 2020-07-06 MED ORDER — MIDAZOLAM HCL 2 MG/2ML IJ SOLN
INTRAMUSCULAR | Status: AC
  Filled 2020-07-06: qty 2

## 2020-07-06 MED ORDER — ACETAMINOPHEN 500 MG PO TABS
1000.0000 mg | ORAL_TABLET | ORAL | Status: AC
  Administered 2020-07-06: 1000 mg via ORAL

## 2020-07-06 MED ORDER — ACETAMINOPHEN 325 MG PO TABS: 325.0000 mg | ORAL_TABLET | ORAL | Status: DC | PRN

## 2020-07-06 MED ORDER — PROPOFOL 10 MG/ML IV BOLUS
INTRAVENOUS | Status: DC | PRN
  Administered 2020-07-06: 200 mg via INTRAVENOUS

## 2020-07-06 MED ORDER — IBUPROFEN 800 MG PO TABS
800.0000 mg | ORAL_TABLET | Freq: Three times a day (TID) | ORAL | 0 refills | Status: DC | PRN
  Filled 2020-07-06: qty 30, 10d supply, fill #0

## 2020-07-06 MED ORDER — ACETAMINOPHEN 500 MG PO TABS
ORAL_TABLET | ORAL | Status: AC
  Filled 2020-07-06: qty 2

## 2020-07-06 MED ORDER — LIDOCAINE 2% (20 MG/ML) 5 ML SYRINGE
INTRAMUSCULAR | Status: AC
  Filled 2020-07-06: qty 5

## 2020-07-06 MED ORDER — OXYCODONE HCL 5 MG PO TABS: 5.0000 mg | ORAL_TABLET | Freq: Once | ORAL | Status: DC | PRN

## 2020-07-06 MED ORDER — LACTATED RINGERS IV SOLN: INTRAVENOUS | Status: DC

## 2020-07-06 MED ORDER — PROPOFOL 10 MG/ML IV BOLUS
INTRAVENOUS | Status: AC
  Filled 2020-07-06: qty 20

## 2020-07-06 MED ORDER — LIDOCAINE 2% (20 MG/ML) 5 ML SYRINGE
INTRAMUSCULAR | Status: DC | PRN
  Administered 2020-07-06: 100 mg via INTRAVENOUS

## 2020-07-06 MED ORDER — MEPERIDINE HCL 25 MG/ML IJ SOLN: 6.2500 mg | INTRAMUSCULAR | Status: DC | PRN

## 2020-07-06 MED ORDER — DEXAMETHASONE SODIUM PHOSPHATE 10 MG/ML IJ SOLN
INTRAMUSCULAR | Status: AC
  Filled 2020-07-06: qty 1

## 2020-07-06 SURGICAL SUPPLY — 20 items
ABLATOR SURESOUND NOVASURE (ABLATOR) ×2 IMPLANT
CANISTER SUCT 3000ML PPV (MISCELLANEOUS) ×2 IMPLANT
CATH ROBINSON RED A/P 16FR (CATHETERS) ×2 IMPLANT
CNTNR URN SCR LID CUP LEK RST (MISCELLANEOUS) ×2 IMPLANT
CONT SPEC 4OZ STRL OR WHT (MISCELLANEOUS) ×4
COVER WAND RF STERILE (DRAPES) ×2 IMPLANT
DILATOR CANAL MILEX (MISCELLANEOUS) IMPLANT
ELECT REM PT RETURN 9FT ADLT (ELECTROSURGICAL)
ELECTRODE REM PT RTRN 9FT ADLT (ELECTROSURGICAL) IMPLANT
GAUZE 4X4 16PLY RFD (DISPOSABLE) ×2 IMPLANT
GLOVE SURG LTX SZ6.5 (GLOVE) ×2 IMPLANT
GLOVE SURG UNDER POLY LF SZ7 (GLOVE) ×10 IMPLANT
GOWN STRL REUS W/TWL LRG LVL3 (GOWN DISPOSABLE) ×4 IMPLANT
IV NS IRRIG 3000ML ARTHROMATIC (IV SOLUTION) ×2 IMPLANT
KIT PROCEDURE FLUENT (KITS) ×2 IMPLANT
KIT TURNOVER CYSTO (KITS) ×2 IMPLANT
PACK VAGINAL MINOR WOMEN LF (CUSTOM PROCEDURE TRAY) ×2 IMPLANT
PAD OB MATERNITY 4.3X12.25 (PERSONAL CARE ITEMS) ×2 IMPLANT
SEAL ROD LENS SCOPE MYOSURE (ABLATOR) ×2 IMPLANT
TOWEL OR 17X26 10 PK STRL BLUE (TOWEL DISPOSABLE) ×4 IMPLANT

## 2020-07-06 NOTE — Anesthesia Procedure Notes (Signed)
Procedure Name: LMA Insertion Date/Time: 07/06/2020 11:25 AM Performed by: Jessica Priest, CRNA Pre-anesthesia Checklist: Patient identified, Emergency Drugs available, Suction available, Patient being monitored and Timeout performed Patient Re-evaluated:Patient Re-evaluated prior to induction Oxygen Delivery Method: Circle system utilized Preoxygenation: Pre-oxygenation with 100% oxygen Induction Type: IV induction Ventilation: Mask ventilation without difficulty LMA: LMA inserted LMA Size: 4.0 Number of attempts: 1 Airway Equipment and Method: Bite block Placement Confirmation: positive ETCO2,  CO2 detector and breath sounds checked- equal and bilateral Tube secured with: Tape Dental Injury: Teeth and Oropharynx as per pre-operative assessment

## 2020-07-06 NOTE — Op Note (Signed)
07/06/2020  11:50 AM  PATIENT:  Jillian Reyes  30 y.o. female  PRE-OPERATIVE DIAGNOSIS:  Menorrhagia, PCOS  POST-OPERATIVE DIAGNOSIS:  Menorrhagia, PCOS  PROCEDURE:  Procedure(s): HYSTEROSCOPY WITH NOVASURE ABLATION, DILITATION AND CURETTAGE  SURGEON:  Jerene Bears  ASSISTANTS: OR staff.    ANESTHESIA:   general  ESTIMATED BLOOD LOSS: 5 mL  BLOOD ADMINISTERED:none   FLUIDS: 700  UOP: pt voided right before going back to the OR  SPECIMEN:  endometrial curetting  DISPOSITION OF SPECIMEN:  PATHOLOGY  FINDINGS: normal endometrial cavity with normal tubal ostium  DESCRIPTION OF OPERATION: Patient was taken to the operating room.  She is placed in the supine position. SCDs were on her lower extremities and functioning properly. General anesthesia with an LMA was administered without difficulty. Dr. Tacy Dura, anesthesia, oversaw case.  Legs were then placed in the [facility] stirrups in the low lithotomy position. The legs were lifted to the high lithotomy position and the Betadine prep was used on the inner thighs perineum and vagina x3. Patient was draped in a normal standard fashion. A bivalve speculum was placed the vagina. The anterior lip of the cervix was grasped with single-tooth tenaculum.  A paracervical block of 1% lidocaine mixed one-to-one with epinephrine (1:100,000 units).  10 cc was used total. The cervix is dilated up to #21 [facility] dilators. The endometrial cavity sounded to 8.5 cm.  A myosure diagnostic hysteroscope was obtained. Normal saline was used as a hysteroscopic fluid. The hysteroscope was advanced through the endocervical canal into the endometrial cavity. The tubal ostia were noted bilaterally. Cavity appeared normal.  The hysteroscope was removed.  Using a #1 smooth curette, the endometrial cavity was curetted until a rough gritty texture was noted in all quadrants.  Then the Novasure device was obtained.  The cervical length was 4.0cm.  The device was opened  outside the pt to ensure it was working properly.  Then the device was passed to the fundus, withdrawn slightly, and then the array was opened.  The device was seated. The cavity width was 3.0cm.  Power setting was 66w.  The cavity assessment test was performed and this passed easily.  Then the ablation cycle was performed.  This lasted 1:29 minutes.  Once this was complete, the array was closed and the Novasure device removed.  The hysteroscopy was used one last time to visualize the cavity.  A good ablation cycle appeared to have occurred with blanched tissue throughout the cavity.  Then the hysteroscope was removed. The fluid deficit was 65 cc. The tenaculum was removed from the anterior lip of the cervix. The speculum was removed from the vagina. The prep was cleansed of the patient's skin. The legs are positioned back in the supine position. Sponge, lap, needle, initially counts were correct x2. Patient was taken to recovery in stable condition.  COUNTS:  YES  PLAN OF CARE: Transfer to PACU

## 2020-07-06 NOTE — Transfer of Care (Signed)
Immediate Anesthesia Transfer of Care Note  Patient: Jillian Reyes  Procedure(s) Performed: Procedure(s) (LRB): HYSTEROSCOPY WITH NOVASURE ABLATION, DILITATION AND CURETTAGE (N/A)  Patient Location: PACU  Anesthesia Type: General  Level of Consciousness: awake, sedated, patient cooperative and responds to stimulation  Airway & Oxygen Therapy: Patient Spontanous Breathing and Patient connected to [place] 02 and soft FM   Post-op Assessment: Report given to PACU RN, Post -op Vital signs reviewed and stable and Patient moving all extremities  Post vital signs: Reviewed and stable  Complications: No apparent anesthesia complications

## 2020-07-06 NOTE — H&P (Signed)
Jillian Reyes is an 30 y.o. female  G1P1 MWF with h/o menorrhagia with irregular cycles here for hysteroscopy, D&C and endometrial ablation.  Pt is desirous of treatment and does not want any hormonal therapy.  Pt cannot use OCPs due to migraines with aura.  She has failed POPs due to side effects.  She did have a progesterone IUD which also caused side effects so she had it removed.  She used Depo Provera and also had side effects.  She is not interested in hysterectomy at this time.  Husband has undergone a vasectomy.  She is sure that she does not want any more child bearing and knows she should not be pregnant ever again in the future.  She also knows if she and her husband break up, she will need to have permanent sterilization procedure done if future partner has not also had a vasectomy.  Pt voices clear understanding. Risks and benefits have been reviewed.  She is here and ready to proceed.  Pertinent Gynecological History: Contraception: vasectomy DES exposure: denies Sexually transmitted diseases: no past history Previous GYN Procedures: laparoscopy  Last mammogram: n/a  Last pap: normal Date: 11/2018 OB History: G1, P1   Menstrual History: Patient's last menstrual period was 07/05/2020 (exact date).    Past Medical History:  Diagnosis Date  . Acne   . Asthma due to environmental allergies    per pt moderate, prn inhaler,  followed by pcp  . Bipolar 1 disorder ([place])   . Chronic back pain   . Chronic pelvic pain in female   . Dyslipidemia 02/18/2013  . Eczema   . GAD (generalized anxiety disorder)   . GERD (gastroesophageal reflux disease)   . History of abnormal cervical Pap smear   . History of chlamydia 2015  . History of gestational diabetes   . Irregular periods/menstrual cycles   . Migraines   . PCOS (polycystic ovarian syndrome)   . Vitamin D deficiency   . Wears glasses     Past Surgical History:  Procedure Laterality Date  . INCISION AND DRAINAGE Right  01/12/2019   Procedure: INCISION AND DRAINAGE OF LESION;  Surgeon: Megan Salon, MD;  Location: [facility];  Service: Gynecology;  Laterality: Right;  . KNEE ARTHROSCOPY Right 04-16-2019  '@[facility]'   . LAPAROSCOPY N/A 01/12/2019   Procedure: LAPAROSCOPY DIAGNOSTIC; LYSIS OF ADHESIONS;  Surgeon: Megan Salon, MD;  Location: [facility];  Service: Gynecology;  Laterality: N/A;  possible treatement of endometriosis  . WISDOM TOOTH EXTRACTION  2016    Family History  Problem Relation Age of Onset  . Heart disease Other   . Diabetes Father 66       type 2  . Thyroid disease Father   . Brain cancer Father   . Cancer Father        tumor of the brain cancer  . Migraines Mother   . Kidney disease Mother   . Colon polyps Mother   . Hyperlipidemia Other        great grandparents    Social History:  reports that she quit smoking about 2 years ago. Her smoking use included cigarettes. She quit after 13.00 years of use. She has never used smokeless tobacco. She reports previous alcohol use. She reports that she does not use drugs.  Allergies:  Allergies  Allergen Reactions  . Cymbalta [Duloxetine Hcl] Nausea And Vomiting    Medications Prior to Admission  Medication Sig Dispense Refill Last Dose  .  albuterol (VENTOLIN HFA) 108 (90 Base) MCG/ACT inhaler Inhale 2 puffs into the lungs every 6 (six) hours as needed for wheezing or shortness of breath. 18 g 2 07/05/2020 at Unknown time  . alprazolam (XANAX) 2 MG tablet Take 1 tablet (2 mg total) by mouth at bedtime as needed. for sleep. 30 tablet 3 07/05/2020 at Unknown time  . clobetasol cream (TEMOVATE) 0.05 % APPLY TOPICALLY DAILY AS NEEDED. ECZEMA 60 g 3 Past Week at Unknown time  . DUPIXENT 300 MG/2ML SOPN      . EUCRISA 2 % OINT as needed.   Past Month at Unknown time  . fluticasone (FLONASE) 50 MCG/ACT nasal spray Place 2 sprays into both nostrils 2 (two) times daily. 18.2 mL 5 07/06/2020 at 0700  . lamoTRIgine  (LAMICTAL) 200 MG tablet Take 1 tablet (200 mg total) by mouth at bedtime. 30 tablet 2 07/05/2020 at Unknown time  . Multiple Vitamin (MULTIVITAMIN ADULT PO) Take by mouth daily.   07/05/2020 at Unknown time  . Ubrogepant (UBRELVY) 100 MG TABS Take 1 tablet by mouth as needed (May repeat 1 tablet in 2 hours.  Maximum 2 tablets in 24 hours.). 10 tablet 5 Past Month at Unknown time  . Vitamin D, Ergocalciferol, (DRISDOL) 1.25 MG (50000 UNIT) CAPS capsule Take 1 capsule (50,000 Units total) by mouth every 7 (seven) days. 12 capsule 0 Past Week at Unknown time  . Lamotrigine 84 x 25 MG & 14x100 MG KIT Take 25 mg by mouth daily for 14 days, THEN 50 mg daily for 14 days, THEN 100 mg daily for 14 days, THEN 200 mg daily. (Patient not taking: Reported on 06/28/2020) 1 kit 0 Not Taking at Unknown time  . metFORMIN (GLUCOPHAGE) 500 MG tablet Take 1 tablet (500 mg total) by mouth 2 (two) times daily with a meal. TAKE 1 TAB IN THE MORNING FOR 2 WEEKS, THEN INCREASE TO 1 TAB TWICE DAILY (Patient taking differently: Take 500 mg by mouth 2 (two) times daily with a meal. TAKE 1 TAB IN THE MORNING FOR 2 WEEKS, THEN INCREASE TO 1 TAB TWICE DAILY) 60 tablet 12 More than a month at Unknown time    Review of Systems  All other systems reviewed and are negative.   Blood pressure 113/69, pulse 77, temperature 98.4 F (36.9 C), temperature source Oral, resp. rate 17, height 5' 4.5" (1.638 m), weight 84.5 kg, last menstrual period 07/05/2020, SpO2 99 %. Physical Exam Constitutional:      Appearance: Normal appearance.  Cardiovascular:     Rate and Rhythm: Normal rate and regular rhythm.  Pulmonary:     Effort: Pulmonary effort is normal.     Breath sounds: Normal breath sounds.  Neurological:     General: No focal deficit present.     Mental Status: She is alert.  Psychiatric:        Mood and Affect: Mood normal.     Results for orders placed or performed during the hospital encounter of 07/06/20 (from the past 24  hour(s))  CBC     Status: None   Collection Time: 07/06/20  9:38 AM  Result Value Ref Range   WBC 6.5 4.0 - 10.5 K/uL   RBC 4.69 3.87 - 5.11 MIL/uL   Hemoglobin 14.6 12.0 - 15.0 g/dL   HCT 42.9 36.0 - 46.0 %   MCV 91.5 80.0 - 100.0 fL   MCH 31.1 26.0 - 34.0 pg   MCHC 34.0 30.0 - 36.0 g/dL   RDW  12.8 11.5 - 15.5 %   Platelets 255 150 - 400 K/uL   nRBC 0.0 0.0 - 0.2 %  Pregnancy, urine POC     Status: None   Collection Time: 07/06/20  9:43 AM  Result Value Ref Range   Preg Test, Ur NEGATIVE NEGATIVE    No results found.  Assessment/Plan: 30 yo G1p1 MWF with menorrhagia and irregular cycles desirous of treatment without hormonal therapy.  Husband has undergone a vasectomy and she is sure she does not want future childbearing.  Questions answered.  Pt ready to proceed.  Megan Salon 07/06/2020, 11:05 AM

## 2020-07-06 NOTE — Discharge Instructions (Addendum)
Post-surgical Instructions, Outpatient Surgery  You may expect to feel dizzy, weak, and drowsy for as long as 24 hours after receiving the medicine that made you sleep (anesthetic). For the first 24 hours after your surgery:   Do not drive a car, ride a bicycle, participate in physical activities, or take public transportation until you are done taking narcotic pain medicines or as directed by Dr. Miller.  Do not drink alcohol or take tranquilizers.  Do not take medicine that has not been prescribed by your physicians.  Do not sign important papers or make important decisions while on narcotic pain medicines.  Have a responsible person with you.   PAIN MANAGEMENT Motrin 800mg.  (This is the same as 4-200mg over the counter tablets of Motrin or ibuprofen.)  You may take this every eight hours or as needed for cramping.   Vicodin 5/325mg.  For more severe pain, take one or two tablets every four to six hours as needed for pain control.  (Remember that narcotic pain medications increase your risk of constipation.  If this becomes a problem, you may take an over the counter stool softener like Colace 100mg up to four times a day.)  DO'S AND DON'T'S Do not take a tub bath for one week.  You may shower on the first day after your surgery Do not do any heavy lifting for one to two weeks.  This increases the chance of bleeding. Do move around as you feel able.  Stairs are fine.  You may begin to exercise again as you feel able.  Do not lift any weights for two weeks. Do not put anything in the vagina for two weeks--no tampons, intercourse, or douching.    REGULAR MEDIATIONS/VITAMINS: You may restart all of your regular medications as prescribed. You may restart all of your vitamins as you normally take them.    PLEASE CALL OR SEEK MEDICAL CARE IF: You have persistent nausea and vomiting.  You have trouble eating or drinking.  You have an oral temperature above 100.5.  You have constipation that is  not helped by adjusting diet or increasing fluid intake. Pain medicines are a common cause of constipation.  You have heavy vaginal bleeding   Post Anesthesia Home Care Instructions  Activity: Get plenty of rest for the remainder of the day. A responsible individual must stay with you for 24 hours following the procedure.  For the next 24 hours, DO NOT: -Drive a car -Operate machinery -Drink alcoholic beverages -Take any medication unless instructed by your physician -Make any legal decisions or sign important papers.  Meals: Start with liquid foods such as gelatin or soup. Progress to regular foods as tolerated. Avoid greasy, spicy, heavy foods. If nausea and/or vomiting occur, drink only clear liquids until the nausea and/or vomiting subsides. Call your physician if vomiting continues.  Special Instructions/Symptoms: Your throat may feel dry or sore from the anesthesia or the breathing tube placed in your throat during surgery. If this causes discomfort, gargle with warm salt water. The discomfort should disappear within 24 hours.  If you had a scopolamine patch placed behind your ear for the management of post- operative nausea and/or vomiting:  1. The medication in the patch is effective for 72 hours, after which it should be removed.  Wrap patch in a tissue and discard in the trash. Wash hands thoroughly with soap and water. 2. You may remove the patch earlier than 72 hours if you experience unpleasant side effects which may   include dry mouth, dizziness or visual disturbances. 3. Avoid touching the patch. Wash your hands with soap and water after contact with the patch.      

## 2020-07-06 NOTE — Anesthesia Postprocedure Evaluation (Signed)
Anesthesia Post Note  Patient: Jillian Reyes  Procedure(s) Performed: HYSTEROSCOPY WITH NOVASURE ABLATION, DILITATION AND CURETTAGE (N/A Vagina )     Patient location during evaluation: PACU Anesthesia Type: General Level of consciousness: awake and alert Pain management: pain level controlled Vital Signs Assessment: post-procedure vital signs reviewed and stable Respiratory status: spontaneous breathing, nonlabored ventilation, respiratory function stable and patient connected to nasal cannula oxygen Cardiovascular status: blood pressure returned to baseline and stable Postop Assessment: no apparent nausea or vomiting Anesthetic complications: no   No complications documented.  Last Vitals:  Vitals:   07/06/20 1230 07/06/20 1300  BP: (!) 99/58 100/66  Pulse: 82 84  Resp: (!) 8 12  Temp:  36.6 C  SpO2: 94% 97%    Last Pain:  Vitals:   07/06/20 1300  TempSrc:   PainSc: 0-No pain                 Harlon Kutner

## 2020-07-06 NOTE — Anesthesia Preprocedure Evaluation (Addendum)
Anesthesia Evaluation  Patient identified by MRN, date of birth, ID band Patient awake    Reviewed: Allergy & Precautions, NPO status , Patient's Chart, lab work & pertinent test results  Airway Mallampati: I  TM Distance: >3 FB Neck ROM: Full    Dental  (+) Dental Advisory Given, Teeth Intact   Pulmonary asthma , Current Smoker and Patient abstained from smoking., former smoker,    breath sounds clear to auscultation       Cardiovascular  Rhythm:Regular Rate:Normal     Neuro/Psych  Headaches, PSYCHIATRIC DISORDERS Anxiety Bipolar Disorder    GI/Hepatic GERD  Controlled,  Endo/Other    Renal/GU      Musculoskeletal   Abdominal   Peds  Hematology   Anesthesia Other Findings   Reproductive/Obstetrics                            Lab Results  Component Value Date   WBC 8.0 06/21/2020   HGB 13.8 06/21/2020   HCT 40.4 06/21/2020   MCV 90.6 06/21/2020   PLT 253 06/21/2020    Anesthesia Physical  Anesthesia Plan  ASA: II  Anesthesia Plan: General   Post-op Pain Management:    Induction: Intravenous  PONV Risk Score and Plan: 2 and Dexamethasone, Ondansetron and Treatment may vary due to age or medical condition  Airway Management Planned: LMA  Additional Equipment: None  Intra-op Plan:   Post-operative Plan: Extubation in OR  Informed Consent: I have reviewed the patients History and Physical, chart, labs and discussed the procedure including the risks, benefits and alternatives for the proposed anesthesia with the patient or authorized representative who has indicated his/her understanding and acceptance.     Dental advisory given  Plan Discussed with: CRNA and Anesthesiologist  Anesthesia Plan Comments:        Anesthesia Quick Evaluation

## 2020-07-07 ENCOUNTER — Encounter (HOSPITAL_BASED_OUTPATIENT_CLINIC_OR_DEPARTMENT_OTHER): Payer: Self-pay | Admitting: Obstetrics & Gynecology

## 2020-07-07 LAB — SURGICAL PATHOLOGY

## 2020-07-13 NOTE — Progress Notes (Deleted)
NEUROLOGY FOLLOW UP OFFICE NOTE  PHINLEY SCHALL [id number]  Assessment/Plan:   Migraine with aura  1. Migraine prevention:  *** 2. Migraine rescue:  *** 3.  Limit use of pain relievers to no more than 2 days out of week to prevent risk of rebound or medication-overuse headache. 4.  Keep headache diary 5.  Follow up ***  Subjective:  Lurlean A. Ruffino is a 30 year old right-handed female who follows up for migraines.  UPDATE: Resa Miner at last visit. Intensity:  *** Duration:  *** Frequency:  *** Frequency of abortive medication: *** Current NSAIDS/analgesics:  Diclofenac 72m BID (for knee pain) Current triptans:  none Current ergotamine:  none Current anti-emetic:  Zofran ODT 435mCurrent muscle relaxants:  none Current Antihypertensive medications:  none Current Antidepressant medications:  none Current Anticonvulsant medications:  none Current anti-CGRP:  Qulipta 6053mD; Ubrelvy 100m20mrrent Vitamins/Herbal/Supplements:  MVI Current Antihistamines/Decongestants:  Flonase Other therapy:  none Hormone/birth control:  none  Caffeine:  1 cup coffee daily.  Soda once in a while Diet:  3 bottles of water daily.  Skips meals Exercise:  No Depression:  yes; Anxiety:  Yes.  Sister recently passed away from a drug overdose.  History of Bipolar disorder. Other pain:  no Sleep hygiene:  Poor.  Trouble staying asleep.  HISTORY: Beginning at age 61 y42rs old, she had headaches.  In 2012 headaches became more severe.  They start in the back of the neck and base of her skull and radiate to front of head bilaterally.  It is throbbing and associated with nausea, photophobia, phonophobia, rarely vomiting.  Sometimes sees stars or dots.  They usually last 1 to  2 days.  They occur once a month to once every other month.  However, she a dull headache 20 days a month.  Sometimes dull headache may be in temples as well.  Triggers include perfumes and cigarette smell.  Rest  helps.  Maxalt ineffective.  Takes Excedrin Migraine daily.  MRI of cervical spine from 03/22/2015 was unremarkable.   Past NSAIDS/analgesics:  Advil, Tylenol, naproxen, Excedrin Migraine Past abortive triptans:  sumatriptan tab, Maxalt Past abortive ergotamine:  none Past muscle relaxants:  Flexeril, tizanidine Past anti-emetic:  promethazine Past antihypertensive medications:  propranolol Past antidepressant medications:  Duloxetine (side effects) Past anticonvulsant medications:  Topiramate, Depakote Past anti-CGRP:  Aimovig 140mg6mde her sick) Past vitamins/Herbal/Supplements:  none Past antihistamines/decongestants:  none Other past therapies:  Dry needling (helped)   Family history of headache:  Mom (migraines), father (migraines, brain tumor) She has PCOS.  She has irregular periods.    PAST MEDICAL HISTORY: Past Medical History:  Diagnosis Date  . Acne   . Asthma due to environmental allergies    per pt moderate, prn inhaler,  followed by pcp  . Bipolar 1 disorder (HCC) [place] Chronic back pain   . Chronic pelvic pain in female   . Dyslipidemia 02/18/2013  . Eczema   . GAD (generalized anxiety disorder)   . GERD (gastroesophageal reflux disease)   . History of abnormal cervical Pap smear   . History of chlamydia 2015  . History of gestational diabetes   . Irregular periods/menstrual cycles   . Migraines   . PCOS (polycystic ovarian syndrome)   . Vitamin D deficiency   . Wears glasses     MEDICATIONS: Current Outpatient Medications on File Prior to Visit  Medication Sig Dispense Refill  . albuterol (VENTOLIN HFA) 108 (90 Base) MCG/ACT  inhaler Inhale 2 puffs into the lungs every 6 (six) hours as needed for wheezing or shortness of breath. 18 g 2  . alprazolam (XANAX) 2 MG tablet Take 1 tablet (2 mg total) by mouth at bedtime as needed. for sleep. 30 tablet 3  . clobetasol cream (TEMOVATE) 0.05 % APPLY TOPICALLY DAILY AS NEEDED. ECZEMA 60 g 3  . DUPIXENT 300  MG/2ML SOPN     . EUCRISA 2 % OINT as needed.    . fluticasone (FLONASE) 50 MCG/ACT nasal spray Place 2 sprays into both nostrils 2 (two) times daily. 18.2 mL 5  . HYDROcodone-acetaminophen (NORCO/VICODIN) 5-325 MG tablet Take 1-2 tablets by mouth every 6 (six) hours as needed for moderate pain. 15 tablet 0  . ibuprofen (ADVIL) 800 MG tablet Take 1 tablet (800 mg total) by mouth every 8 (eight) hours as needed. 30 tablet 0  . lamoTRIgine (LAMICTAL) 200 MG tablet Take 1 tablet (200 mg total) by mouth at bedtime. 30 tablet 2  . Lamotrigine 84 x 25 MG & 14x100 MG KIT Take 25 mg by mouth daily for 14 days, THEN 50 mg daily for 14 days, THEN 100 mg daily for 14 days, THEN 200 mg daily. (Patient not taking: Reported on 06/28/2020) 1 kit 0  . metFORMIN (GLUCOPHAGE) 500 MG tablet Take 1 tablet (500 mg total) by mouth 2 (two) times daily with a meal. TAKE 1 TAB IN THE MORNING FOR 2 WEEKS, THEN INCREASE TO 1 TAB TWICE DAILY (Patient taking differently: Take 500 mg by mouth 2 (two) times daily with a meal. TAKE 1 TAB IN THE MORNING FOR 2 WEEKS, THEN INCREASE TO 1 TAB TWICE DAILY) 60 tablet 12  . Multiple Vitamin (MULTIVITAMIN ADULT PO) Take by mouth daily.    Marland Kitchen Ubrogepant (UBRELVY) 100 MG TABS Take 1 tablet by mouth as needed (May repeat 1 tablet in 2 hours.  Maximum 2 tablets in 24 hours.). 10 tablet 5  . Vitamin D, Ergocalciferol, (DRISDOL) 1.25 MG (50000 UNIT) CAPS capsule Take 1 capsule (50,000 Units total) by mouth every 7 (seven) days. 12 capsule 0  . [DISCONTINUED] naproxen (NAPROSYN) 375 MG tablet Take 1 tablet (375 mg total) by mouth 2 (two) times daily with a meal. 360 tablet 0   No current facility-administered medications on file prior to visit.    ALLERGIES: Allergies  Allergen Reactions  . Cymbalta [Duloxetine Hcl] Nausea And Vomiting    FAMILY HISTORY: Family History  Problem Relation Age of Onset  . Heart disease Other   . Diabetes Father 27       type 2  . Thyroid disease Father   .  Brain cancer Father   . Cancer Father        tumor of the brain cancer  . Migraines Mother   . Kidney disease Mother   . Colon polyps Mother   . Hyperlipidemia Other        great grandparents      Objective:  *** General: No acute distress.  Patient appears ***-groomed.   Head:  Normocephalic/atraumatic Eyes:  Fundi examined but not visualized Neck: supple, no paraspinal tenderness, full range of motion Heart:  Regular rate and rhythm Lungs:  Clear to auscultation bilaterally Back: No paraspinal tenderness Neurological Exam: alert and oriented to person, place, and time. Attention span and concentration intact, recent and remote memory intact, fund of knowledge intact.  Speech fluent and not dysarthric, language intact.  CN II-XII intact. Bulk and tone normal, muscle strength  5/5 throughout.  Sensation to light touch, temperature and vibration intact.  Deep tendon reflexes 2+ throughout, toes downgoing.  Finger to nose and heel to shin testing intact.  Gait normal, Romberg negative.     [name], DO  CC: ***

## 2020-07-15 ENCOUNTER — Ambulatory Visit: Payer: Federal, State, Local not specified - PPO | Admitting: Neurology

## 2020-07-29 ENCOUNTER — Other Ambulatory Visit (HOSPITAL_COMMUNITY): Payer: Self-pay

## 2020-08-03 ENCOUNTER — Ambulatory Visit (INDEPENDENT_AMBULATORY_CARE_PROVIDER_SITE_OTHER): Payer: Federal, State, Local not specified - PPO | Admitting: Obstetrics & Gynecology

## 2020-08-03 ENCOUNTER — Other Ambulatory Visit: Payer: Self-pay

## 2020-08-03 ENCOUNTER — Encounter (HOSPITAL_BASED_OUTPATIENT_CLINIC_OR_DEPARTMENT_OTHER): Payer: Self-pay | Admitting: Obstetrics & Gynecology

## 2020-08-03 VITALS — BP 97/69 | HR 75 | Wt 186.0 lb

## 2020-08-03 DIAGNOSIS — N921 Excessive and frequent menstruation with irregular cycle: Secondary | ICD-10-CM

## 2020-08-03 DIAGNOSIS — L659 Nonscarring hair loss, unspecified: Secondary | ICD-10-CM | POA: Diagnosis not present

## 2020-08-03 DIAGNOSIS — E559 Vitamin D deficiency, unspecified: Secondary | ICD-10-CM | POA: Diagnosis not present

## 2020-08-03 NOTE — Progress Notes (Signed)
GYNECOLOGY  VISIT  CC:   Recheck after endometrial ablation  HPI: 30 y.o. G67P1011 Married Other or two or more races female here for recheck after undergoing hysteroscopy with novasure ablation on 4/202/2022.  She reports bleeding is none.  She has no pain.  Bowel function is Normal.  Bladder function is normal.    Pathology reviewed:  Yes .  Questions answered.    Still having hair loss.  Has had extensvie blood work done.  Taking multivitamins, Vit D and biotin.  She's taking several additional supplements for possible hair growth.    MEDS:   Current Outpatient Medications on File Prior to Visit  Medication Sig Dispense Refill  . albuterol (VENTOLIN HFA) 108 (90 Base) MCG/ACT inhaler Inhale 2 puffs into the lungs every 6 (six) hours as needed for wheezing or shortness of breath. 18 g 2  . alprazolam (XANAX) 2 MG tablet Take 1 tablet (2 mg total) by mouth at bedtime as needed. for sleep. 30 tablet 3  . clobetasol cream (TEMOVATE) 0.05 % APPLY TOPICALLY DAILY AS NEEDED. ECZEMA 60 g 3  . DUPIXENT 300 MG/2ML SOPN     . EUCRISA 2 % OINT as needed.    . fluticasone (FLONASE) 50 MCG/ACT nasal spray Place 2 sprays into both nostrils 2 (two) times daily. 18.2 mL 5  . lamoTRIgine (LAMICTAL) 200 MG tablet Take 1 tablet (200 mg total) by mouth at bedtime. 30 tablet 2  . Multiple Vitamin (MULTIVITAMIN ADULT PO) Take by mouth daily.    Marland Kitchen Ubrogepant (UBRELVY) 100 MG TABS Take 1 tablet by mouth as needed (May repeat 1 tablet in 2 hours.  Maximum 2 tablets in 24 hours.). 10 tablet 5  . Vitamin D, Ergocalciferol, (DRISDOL) 1.25 MG (50000 UNIT) CAPS capsule Take 1 capsule (50,000 Units total) by mouth every 7 (seven) days. 12 capsule 0  . HYDROcodone-acetaminophen (NORCO/VICODIN) 5-325 MG tablet Take 1-2 tablets by mouth every 6 (six) hours as needed for moderate pain. (Patient not taking: Reported on 08/03/2020) 15 tablet 0  . ibuprofen (ADVIL) 800 MG tablet Take 1 tablet (800 mg total) by mouth every 8  (eight) hours as needed. (Patient not taking: Reported on 08/03/2020) 30 tablet 0  . Lamotrigine 84 x 25 MG & 14x100 MG KIT Take 25 mg by mouth daily for 14 days, THEN 50 mg daily for 14 days, THEN 100 mg daily for 14 days, THEN 200 mg daily. (Patient not taking: Reported on 06/28/2020) 1 kit 0  . metFORMIN (GLUCOPHAGE) 500 MG tablet Take 1 tablet (500 mg total) by mouth 2 (two) times daily with a meal. TAKE 1 TAB IN THE MORNING FOR 2 WEEKS, THEN INCREASE TO 1 TAB TWICE DAILY (Patient not taking: Reported on 08/03/2020) 60 tablet 12  . [DISCONTINUED] naproxen (NAPROSYN) 375 MG tablet Take 1 tablet (375 mg total) by mouth 2 (two) times daily with a meal. 360 tablet 0   No current facility-administered medications on file prior to visit.    SH:  Smoking No    PHYSICAL EXAMINATION:    BP 97/69   Pulse 75   Wt 186 lb (84.4 kg)   LMP 07/05/2020 (Exact Date)   BMI 31.43 kg/m     General appearance: alert, cooperative and appears stated age Abdomen: soft, non-tender; bowel sounds normal; no masses,  no organomegaly Incisions:  C/D/I  Pelvic: External genitalia:  no lesions              Urethra:  normal appearing  urethra with no masses, tenderness or lesions              Bartholins and Skenes: normal                 Vagina: normal appearing vagina with normal color and discharge, no lesions              Cervix: no lesions              Bimanual Exam:  Uterus:  normal size, contour, position, consistency, mobility, non-tender              Adnexa: no mass, fullness, tenderness  Chaperone, Octaviano Batty, CMA, was present for exam.  Assessment/Plan: 1. Menorrhagia with irregular cycle - s/p ablation.  She is going to let me know when has next cycle  2. Vitamin D deficiency - VITAMIN D 25 Hydroxy (Vit-D Deficiency, Fractures); Future - on 50,000 IU weekly.  Has rx.  3. Hair loss - pt is going to see dermatology

## 2020-08-09 ENCOUNTER — Other Ambulatory Visit: Payer: Self-pay

## 2020-08-09 ENCOUNTER — Ambulatory Visit (INDEPENDENT_AMBULATORY_CARE_PROVIDER_SITE_OTHER): Payer: Federal, State, Local not specified - PPO | Admitting: Plastic Surgery

## 2020-08-09 ENCOUNTER — Encounter: Payer: Self-pay | Admitting: Plastic Surgery

## 2020-08-09 VITALS — BP 104/69 | HR 86 | Ht 64.0 in | Wt 183.0 lb

## 2020-08-09 DIAGNOSIS — M549 Dorsalgia, unspecified: Secondary | ICD-10-CM

## 2020-08-09 DIAGNOSIS — L309 Dermatitis, unspecified: Secondary | ICD-10-CM

## 2020-08-09 DIAGNOSIS — M542 Cervicalgia: Secondary | ICD-10-CM | POA: Diagnosis not present

## 2020-08-09 DIAGNOSIS — N62 Hypertrophy of breast: Secondary | ICD-10-CM

## 2020-08-09 NOTE — Progress Notes (Signed)
Patient ID: Jillian Reyes, female    DOB: 1990/08/14, 30 y.o.   MRN: [id number]   Chief Complaint  Patient presents with  . Consult    Mammary Hyperplasia: The patient is a 30 y.o. female with a history of mammary hyperplasia for several years.  She has extremely large breasts causing symptoms that include the following: Back pain in the upper and lower back, including neck pain. She pulls or pins her bra straps to provide better lift and relief of the pressure and pain. She notices relief by holding her breast up manually.  Her shoulder straps cause grooves and pain and pressure that requires padding for relief. Pain medication is sometimes required with motrin and tylenol.  Activities that are hindered by enlarged breasts include: exercise and running.  She has tried supportive clothing as well as fitted bras without improvement.  Her breasts are extremely large and fairly symmetric.  She has hyperpigmentation of the inframammary area on both sides.  The sternal to nipple distance on the right is 34 cm and the left is 34 cm.  The IMF distance is 17 cm.  She is 5 feet 4 inches tall and weighs 183 pounds.  The BMI = 31.4.  Preoperative bra size = F cup.  The estimated excess breast tissue to be removed at the time of surgery = 550 grams on the left and 550 grams on the right.  Mammogram history: none.  Family history of breast cancer:  none.  Tobacco use:  none.   The patient expresses the desire to pursue surgical intervention.   Review of Systems  Constitutional: Positive for activity change. Negative for appetite change.  HENT: Negative.   Eyes: Negative.   Respiratory: Negative.  Negative for chest tightness and shortness of breath.   Cardiovascular: Negative for leg swelling.  Gastrointestinal: Negative for abdominal distention.  Endocrine: Negative.   Genitourinary: Negative.   Musculoskeletal: Positive for back pain and neck pain.  Neurological: Negative.   Hematological:  Negative.   Psychiatric/Behavioral: Negative.     Past Medical History:  Diagnosis Date  . Acne   . Asthma due to environmental allergies    per pt moderate, prn inhaler,  followed by pcp  . Bipolar 1 disorder ([place])   . Chronic back pain   . Chronic pelvic pain in female   . Dyslipidemia 02/18/2013  . Eczema   . GAD (generalized anxiety disorder)   . GERD (gastroesophageal reflux disease)   . History of abnormal cervical Pap smear   . History of chlamydia 2015  . History of gestational diabetes   . Irregular periods/menstrual cycles   . Migraines   . PCOS (polycystic ovarian syndrome)   . Vitamin D deficiency   . Wears glasses     Past Surgical History:  Procedure Laterality Date  . HYSTEROSCOPY WITH NOVASURE N/A 07/06/2020   Procedure: HYSTEROSCOPY WITH NOVASURE ABLATION, DILITATION AND CURETTAGE;  Surgeon: Megan Salon, MD;  Location: [facility];  Service: Gynecology;  Laterality: N/A;  . INCISION AND DRAINAGE Right 01/12/2019   Procedure: INCISION AND DRAINAGE OF LESION;  Surgeon: Megan Salon, MD;  Location: [place];  Service: Gynecology;  Laterality: Right;  . KNEE ARTHROSCOPY Right 04-16-2019  _0   . LAPAROSCOPY N/A 01/12/2019   Procedure: LAPAROSCOPY DIAGNOSTIC; LYSIS OF ADHESIONS;  Surgeon: Megan Salon, MD;  Location: [facility];  Service: Gynecology;  Laterality: N/A;  possible treatement of endometriosis  .  WISDOM TOOTH EXTRACTION  2016      Current Outpatient Medications:  .  albuterol (VENTOLIN HFA) 108 (90 Base) MCG/ACT inhaler, Inhale 2 puffs into the lungs every 6 (six) hours as needed for wheezing or shortness of breath., Disp: 18 g, Rfl: 2 .  alprazolam (XANAX) 2 MG tablet, Take 1 tablet (2 mg total) by mouth at bedtime as needed. for sleep., Disp: 30 tablet, Rfl: 3 .  clobetasol cream (TEMOVATE) 0.05 %, APPLY TOPICALLY DAILY AS NEEDED. ECZEMA, Disp: 60 g, Rfl: 3 .  DUPIXENT 300 MG/2ML SOPN, , Disp: ,  Rfl:  .  EUCRISA 2 % OINT, as needed., Disp: , Rfl:  .  fluticasone (FLONASE) 50 MCG/ACT nasal spray, Place 2 sprays into both nostrils 2 (two) times daily., Disp: 18.2 mL, Rfl: 5 .  lamoTRIgine (LAMICTAL) 200 MG tablet, Take 1 tablet (200 mg total) by mouth at bedtime., Disp: 30 tablet, Rfl: 2 .  metFORMIN (GLUCOPHAGE) 500 MG tablet, Take 1 tablet (500 mg total) by mouth 2 (two) times daily with a meal. TAKE 1 TAB IN THE MORNING FOR 2 WEEKS, THEN INCREASE TO 1 TAB TWICE DAILY, Disp: 60 tablet, Rfl: 12 .  Multiple Vitamin (MULTIVITAMIN ADULT PO), Take by mouth daily., Disp: , Rfl:  .  Ubrogepant (UBRELVY) 100 MG TABS, Take 1 tablet by mouth as needed (May repeat 1 tablet in 2 hours.  Maximum 2 tablets in 24 hours.)., Disp: 10 tablet, Rfl: 5 .  Vitamin D, Ergocalciferol, (DRISDOL) 1.25 MG (50000 UNIT) CAPS capsule, Take 1 capsule (50,000 Units total) by mouth every 7 (seven) days., Disp: 12 capsule, Rfl: 0 .  HYDROcodone-acetaminophen (NORCO/VICODIN) 5-325 MG tablet, Take 1-2 tablets by mouth every 6 (six) hours as needed for moderate pain. (Patient not taking: Reported on 08/03/2020), Disp: 15 tablet, Rfl: 0 .  ibuprofen (ADVIL) 800 MG tablet, Take 1 tablet (800 mg total) by mouth every 8 (eight) hours as needed. (Patient not taking: Reported on 08/03/2020), Disp: 30 tablet, Rfl: 0 .  Lamotrigine 84 x 25 MG & 14x100 MG KIT, Take 25 mg by mouth daily for 14 days, THEN 50 mg daily for 14 days, THEN 100 mg daily for 14 days, THEN 200 mg daily. (Patient not taking: Reported on 06/28/2020), Disp: 1 kit, Rfl: 0   Objective:   Vitals:   08/09/20 0820  BP: 104/69  Pulse: 86  SpO2: 97%    Physical Exam Vitals and nursing note reviewed.  Constitutional:      Appearance: Normal appearance.  HENT:     Head: Normocephalic and atraumatic.  Eyes:     Pupils: Pupils are equal, round, and reactive to light.  Cardiovascular:     Rate and Rhythm: Normal rate.     Pulses: Normal pulses.  Pulmonary:      Effort: Pulmonary effort is normal. No respiratory distress.  Abdominal:     General: Abdomen is flat. There is no distension.     Tenderness: There is no abdominal tenderness.  Musculoskeletal:        General: No swelling or deformity.  Skin:    General: Skin is warm.     Capillary Refill: Capillary refill takes less than 2 seconds.     Coloration: Skin is not jaundiced.     Findings: Rash present. No bruising or lesion.  Neurological:     General: No focal deficit present.     Mental Status: She is alert and oriented to person, place, and time.  Psychiatric:  Mood and Affect: Mood normal.        Behavior: Behavior normal.     Assessment & Plan:  Bilateral neck pain  Mid back pain  Eczema, unspecified type  Symptomatic mammary hypertrophy  The procedure the patient selected and that was best for the patient was discussed. The risk were discussed and include but not limited to the following:  Breast asymmetry, fluid accumulation, firmness of the breast, inability to breast feed, loss of nipple or areola, skin loss, change in skin and nipple sensation, fat necrosis of the breast tissue, bleeding, infection and healing delay.  There are risks of anesthesia and injury to nerves or blood vessels.  Allergic reaction to tape, suture and skin glue are possible.  There will be swelling.  Any of these can lead to the need for revisional surgery.  A breast reduction has potential to interfere with diagnostic procedures in the future.  This procedure is best done when the breast is fully developed.  Changes in the breast will continue to occur over time: pregnancy, weight gain or weigh loss.    Total time: 45 minutes. This includes time spent with the patient during the visit as well as time spent before and after the visit reviewing the chart, documenting the encounter and ordering pertinent studies. and literature emailed to the patient.   Physical therapy:  May not be  needed. Mammogram:  Not indicated Healthy Weight and Wellness:  no  The patient is a good candidate for bilateral breast reduction.  We will submit.  Patient might be interested in liposuction of the lateral area of her breasts. Pictures were obtained of the patient and placed in the chart with the patient's or guardian's permission.   [place], DO

## 2020-08-29 ENCOUNTER — Institutional Professional Consult (permissible substitution): Payer: Federal, State, Local not specified - PPO | Admitting: Neurology

## 2020-08-29 DIAGNOSIS — L2084 Intrinsic (allergic) eczema: Secondary | ICD-10-CM | POA: Diagnosis not present

## 2020-08-29 DIAGNOSIS — L718 Other rosacea: Secondary | ICD-10-CM | POA: Diagnosis not present

## 2020-08-29 DIAGNOSIS — L650 Telogen effluvium: Secondary | ICD-10-CM | POA: Diagnosis not present

## 2020-09-06 ENCOUNTER — Ambulatory Visit: Payer: Federal, State, Local not specified - PPO | Attending: Plastic Surgery | Admitting: Physical Therapy

## 2020-09-06 ENCOUNTER — Encounter: Payer: Self-pay | Admitting: Physical Therapy

## 2020-09-06 ENCOUNTER — Other Ambulatory Visit: Payer: Self-pay | Admitting: Family Medicine

## 2020-09-06 ENCOUNTER — Ambulatory Visit: Payer: Federal, State, Local not specified - PPO

## 2020-09-06 ENCOUNTER — Other Ambulatory Visit: Payer: Self-pay

## 2020-09-06 DIAGNOSIS — G8929 Other chronic pain: Secondary | ICD-10-CM | POA: Insufficient documentation

## 2020-09-06 DIAGNOSIS — M545 Low back pain, unspecified: Secondary | ICD-10-CM | POA: Diagnosis not present

## 2020-09-06 DIAGNOSIS — M542 Cervicalgia: Secondary | ICD-10-CM | POA: Diagnosis not present

## 2020-09-06 DIAGNOSIS — M546 Pain in thoracic spine: Secondary | ICD-10-CM | POA: Insufficient documentation

## 2020-09-06 DIAGNOSIS — E785 Hyperlipidemia, unspecified: Secondary | ICD-10-CM

## 2020-09-06 NOTE — Patient Instructions (Signed)
Access Code: [email or web address] URL: [email or web address] Date: 09/06/2020 Prepared by: Myrla Halsted  Exercises Bird Dog Progression - 1 x daily - 7 x weekly - 2 sets - 10 reps Supine Bridge - 1 x daily - 7 x weekly - 2 sets - 10 reps Supine Posterior Pelvic Tilt - 1 x daily - 7 x weekly - 2 sets - 10 reps Sidelying Thoracic Rotation with Open Book - 1 x daily - 7 x weekly - 2 sets - 10 reps Seated Piriformis Stretch with Trunk Bend - 1 x daily - 7 x weekly - 2 sets - 10 reps Seated Bilateral Shoulder Flexion Towel Slide at Table Top - 1 x daily - 7 x weekly - 2 sets - 10 reps - 3sec hold Seated Scapular Retraction - 1 x daily - 7 x weekly - 2 sets - 10 reps - 5sec hold Seated Cervical Retraction and Extension - 1 x daily - 7 x weekly - 2 sets - 10 reps

## 2020-09-06 NOTE — Therapy (Signed)
[facility] [address] [place], [place], [address] Phone: [phone]   Fax:  [phone]  Physical Therapy Evaluation  Patient Details  Name: Jillian MansDebra A Giusto MRN: [id number] Date of Birth: 08/31/1990 Referring Provider (PT): Foster Simpsonlaire Dillingham, DO   Encounter Date: 09/06/2020   PT End of Session - 09/06/20 2050     Visit Number 1    Number of Visits 6    Date for PT Re-Evaluation 10/18/20    Authorization Type BCBS    Progress Note Due on Visit 10    PT Start Time 1520    PT Stop Time 1615    PT Time Calculation (min) 55 min    Activity Tolerance No increased pain;Patient tolerated treatment well    Behavior [facility] for tasks assessed/performed             Past Medical History:  Diagnosis Date   Acne    Asthma due to environmental allergies    per pt moderate, prn inhaler,  followed by pcp   Bipolar 1 disorder (HCC)    Chronic back pain    Chronic pelvic pain in female    Dyslipidemia 02/18/2013   Eczema    GAD (generalized anxiety disorder)    GERD (gastroesophageal reflux disease)    History of abnormal cervical Pap smear    History of chlamydia 2015   History of gestational diabetes    Irregular periods/menstrual cycles    Migraines    PCOS (polycystic ovarian syndrome)    Vitamin D deficiency    Wears glasses     Past Surgical History:  Procedure Laterality Date   HYSTEROSCOPY WITH NOVASURE N/A 07/06/2020   Procedure: HYSTEROSCOPY WITH NOVASURE ABLATION, DILITATION AND CURETTAGE;  Surgeon: Jerene BearsMiller, Mary S, MD;  [facility];  Service: Gynecology;  Laterality: N/A;   INCISION AND DRAINAGE Right 01/12/2019   Procedure: INCISION AND DRAINAGE OF LESION;  Surgeon: Jerene BearsMiller, Mary S, MD;  [facility] [place];  Service: Gynecology;  Laterality: Right;   KNEE ARTHROSCOPY Right 04-16-2019  @[facility]    LAPAROSCOPY N/A 01/12/2019   Procedure: LAPAROSCOPY DIAGNOSTIC;  LYSIS OF ADHESIONS;  Surgeon: Jerene BearsMiller, Mary S, MD;  [facility] [place];  Service: Gynecology;  Laterality: N/A;  possible treatement of endometriosis   WISDOM TOOTH EXTRACTION  2016    There were no vitals filed for this visit.    Subjective Assessment - 09/06/20 1532     Subjective Patient reports neck, upper and lower back and UT region pain for years, finally decided it was getting bad enough to do something about. Patient reports increasing difficulty with sup shoulder pain and dent from bra straps.    How long can you sit comfortably? 45 min    Currently in Pain? Yes    Pain Score 7     Pain Location Neck    Pain Orientation Mid    Pain Descriptors / Indicators Aching;Throbbing    Pain Type Chronic pain    Pain Frequency Constant   Occasional low/no pain.   Aggravating Factors  lifting/carrying    Pain Relieving Factors Ice, heat    Multiple Pain Sites Yes    Pain Score 6    Pain Location Thoracic    Pain Orientation Upper;Lower    Pain Type Chronic pain    Pain Frequency Several days a week    Aggravating Factors  Prolonged standing    Pain Relieving Factors heat  [facility] PT Assessment - 09/06/20 0001       Assessment   Medical Diagnosis M62 Mammary Hypertrophy, M54.2 cervicalgia, M54.9 Dorsalgia, shoulder pain    Referring Provider (PT) Foster Simpson, DO    Onset Date/Surgical Date 03/20/11    Hand Dominance Right;Left    Next MD Visit TBD    Prior Therapy Yes, knee      Precautions   Precautions None      Balance Screen   Has the patient fallen in the past 6 months No    How many times? 0    Has the patient had a decrease in activity level because of a fear of falling?  No    Is the patient reluctant to leave their home because of a fear of falling?  No      Home Environment   Living Environment Private residence    Living Arrangements Spouse/significant other;Children    Home Access Level entry      Prior Function    Level of Independence Independent    Vocation Full time employment    Vocation Requirements postal service    Leisure motorcycle       Observation/Other Assessments   Focus on Therapeutic Outcomes (FOTO)  N/A      Sensation   Light Touch Appears Intact      Posture/Postural Control   Posture/Postural Control Postural limitations    Postural Limitations Rounded Shoulders;Forward head    Posture Comments protracted shoulders      AROM   Lumbar Flexion 3cm    Lumbar Extension 50%    Lumbar - Right Side Bend 42    Lumbar - Left Side Bend 43    Lumbar - Right Rotation 50%    Lumbar - Left Rotation 50%      Strength   Right Shoulder Flexion 5/5    Right Shoulder ABduction 5/5    Left Shoulder Flexion 4+/5    Left Shoulder ABduction 5/5    Right Hip Flexion 5/5    Left Hip Flexion 5/5      Flexibility   Hamstrings R=65 L=75      Palpation   Palpation comment TTP B UT, cervical and upper thoracic and lumbar paraspinals, PA glides cervical upper thoracic and L2-L4      FABER test   findings Negative    Comment Bilateral      Straight Leg Raise   Findings Negative    Comment Bilateral      Pelvic Dictraction   Findings Negative      Pelvic Compression   Findings Negative      Sacral Compression   Findings Negative                        Objective measurements completed on examination: See above findings.       [facility] Adult PT Treatment/Exercise - 09/06/20 0001       Transfers   Five time sit to stand comments  10sec, low risk of falls      Neck Exercises: Seated   Neck Retraction 10 reps    Neck Retraction Limitations Combo with extension    Other Seated Exercise Scap retraction 10x5sec hold      Lumbar Exercises: Stretches   Piriformis Stretch 2 reps;20 seconds    Piriformis Stretch Limitations Seated    Other Lumbar Stretch Exercise shoulder flexion table slides x10      Lumbar Exercises: Supine   Bridge 10  reps      Lumbar  Exercises: Sidelying   Clam Limitations BLUE    Other Sidelying Lumbar Exercises Open door R/L x10      Lumbar Exercises: Quadruped   Single Arm Raise 10 reps    Straight Leg Raise 10 reps                    PT Education - 09/06/20 2047     Education Details Results of initial evaluation, POC, HEP with verbal cues for technique and pain management during exercises.    Person(s) Educated Patient    Methods Explanation;Demonstration;Verbal cues;Handout    Comprehension Verbalized understanding;Returned demonstration              PT Short Term Goals - 09/06/20 2101       PT SHORT TERM GOAL #1   Title Patient will be independent with initial HEP and pain management techniques.    Baseline No exercise program    Time 2    Period Weeks    Status New    Target Date 09/20/20      PT SHORT TERM GOAL #2   Title Patient will be educated on proper body mechanics and only require min cues to perform.    Baseline No attention to body mechanics.    Time 3    Period Weeks    Status New    Target Date 09/27/20               PT Long Term Goals - 09/06/20 2103       PT LONG TERM GOAL #1   Title Patient will be independent with final HEP and progression to self manage symptoms.    Baseline No exercise program.    Time 6    Period Weeks    Status New    Target Date 10/18/20      PT LONG TERM GOAL #2   Title Patient will place 10 pound weight from floor to head high shelf with proper body mechanics without cues with no more than 3/10 pain.    Baseline Pain with lifting light objects from floor.    Time 6    Period Weeks    Status New    Target Date 10/18/20      PT LONG TERM GOAL #3   Title Patient will report standing >1 hour with no more than 3/10 pain for improved household tasks.    Baseline Less than 45 min to discomfort, difficulty washing dishes.    Time 6    Period Weeks    Status New    Target Date 10/18/20      PT LONG TERM GOAL #4   Title -       PT LONG TERM GOAL #5   Title -                    Plan - 09/06/20 1938     Clinical Impression Statement 30 yo female referred to OPPT with chronic neck and upper/lower back pain with superior shoulder pain, mammary hypertrophy.  Patient presented to PT evaluaion with shoulder/neck/back pain that patient decribes as gradual increase over several years.  Patient is fairly strong B UEs/LEs with decreased trunk ROM particularly with rotation and extension with decreased thoracic and cervical posture.  No apparent SI involvement and no radicular symptoms into UEs or LEs.  Patient would benefit from skilled PT to address deficits and maximize daily tasks with decreased  pain.    Comorbidities LBP, anxiety/depression, asthma, sleep d/o    Examination-Activity Limitations Bend;Lift;Reach Overhead;Stand;Sit    Examination-Participation Restrictions Cleaning;Community Activity;Laundry;Occupation    Stability/Clinical Decision Making Stable/Uncomplicated    Clinical Decision Making Low    Rehab Potential Good    PT Frequency 1x / week    PT Duration 6 weeks    PT Treatment/Interventions ADLs/Self Care Home Management;Aquatic Therapy;Cryotherapy;Electrical Stimulation;Moist Heat;Traction;Functional mobility training;Therapeutic activities;Therapeutic exercise;Neuromuscular re-education;Patient/family education;Manual techniques;Dry needling;Energy conservation;Spinal Manipulations;Joint Manipulations    PT Next Visit Plan Assess affects of HEP, educate on proper body mechanics, manual treatment, core and scapular stability.    PT Home Exercise Plan Access Code: [email or web address]  Prepared by: Myrla Halsted    Exercises  Bird Dog Progression - 1 x daily - 7 x weekly - 2 sets - 10 reps  Supine Bridge - 1 x daily - 7 x weekly - 2 sets - 10 reps  Supine Posterior Pelvic Tilt - 1 x daily - 7 x weekly - 2 sets - 10 reps  Sidelying Thoracic Rotation with Open Book - 1 x daily - 7 x weekly - 2 sets - 10 reps   Seated Piriformis Stretch with Trunk Bend - 1 x daily - 7 x weekly - 2 sets - 10 reps  Seated Bilateral Shoulder Flexion Towel Slide at Table Top - 1 x daily - 7 x weekly - 2 sets - 10 reps - 3sec hold  Seated Scapular Retraction - 1 x daily - 7 x weekly - 2 sets - 10 reps - 5sec hold  Seated Cervical Retraction and Extension - 1 x daily - 7 x weekly - 2 sets - 10 reps    Consulted and Agree with Plan of Care Patient             Patient will benefit from skilled therapeutic intervention in order to improve the following deficits and impairments:  Decreased range of motion, Impaired UE functional use, Decreased activity tolerance, Pain, Impaired flexibility, Improper body mechanics, Postural dysfunction  Visit Diagnosis: Chronic bilateral low back pain without sciatica  Pain in thoracic spine  Cervicalgia     Problem List Patient Active Problem List   Diagnosis Date Noted   Symptomatic mammary hypertrophy 08/09/2020   PCOS (polycystic ovarian syndrome) 10/16/2019   Grief reaction 05/27/2019   Painful coitus, female 12/05/2018   Tachycardia 07/14/2018   Hypocalcemia 12/22/2016   Right hand pain 12/22/2016   Bilateral calf pain 12/21/2016   Right knee pain 12/21/2016   Closed nondisplaced fracture of distal phalanx of right ring finger 12/07/2016   Hemorrhoids 12/01/2016   History of hidradenitis suppurativa 10/14/2016   Abdominal pain 10/12/2016   Left ankle sprain 04/10/2016   Folliculitis 04/10/2016   Bilateral neck pain 03/16/2016   Preventative health care 04/10/2015   Vitamin D deficiency 04/10/2015   Dysuria 01/10/2014   Allergic rhinitis 11/30/2013   Migraine without aura and without status migrainosus, not intractable 11/17/2013   Encounter for preconception consultation 07/27/2013   Insomnia 07/02/2013   Tendinopathy of rotator cuff 03/08/2013   Dyslipidemia 02/18/2013   Contraceptive management 01/06/2012   Eczema 01/06/2012   Acne 01/06/2012   Hx of  gestational diabetes mellitus, not currently pregnant    Mid back pain    Allergic state    Abnormal cells of cervix    Anxiety as acute reaction to exceptional stress 05/01/2011   Bipolar 1 disorder (HCC) 07/05/2010    Myrla Halsted, PT 09/06/2020, 9:11 PM  [facility] [address] [place], [place], [address] Phone: [phone]   Fax:  [phone]  Name: ALEKA TWITTY MRN: [id number] Date of Birth: December 20, 1990

## 2020-09-06 NOTE — Telephone Encounter (Signed)
Requesting: alprazolam 2mg   Contract: 05/31/2016 UDS: 09/17/2017 Last Visit: 04/20/2020  Next Visit: None Last Refill: 01/08/2020 #30 and 3RF  Please Advise

## 2020-09-13 ENCOUNTER — Encounter: Payer: Self-pay | Admitting: Physical Therapy

## 2020-09-13 ENCOUNTER — Ambulatory Visit: Payer: Federal, State, Local not specified - PPO | Admitting: Physical Therapy

## 2020-09-13 ENCOUNTER — Other Ambulatory Visit: Payer: Self-pay

## 2020-09-13 DIAGNOSIS — M542 Cervicalgia: Secondary | ICD-10-CM | POA: Diagnosis not present

## 2020-09-13 DIAGNOSIS — M546 Pain in thoracic spine: Secondary | ICD-10-CM

## 2020-09-13 DIAGNOSIS — G8929 Other chronic pain: Secondary | ICD-10-CM | POA: Diagnosis not present

## 2020-09-13 DIAGNOSIS — M545 Low back pain, unspecified: Secondary | ICD-10-CM | POA: Diagnosis not present

## 2020-09-13 NOTE — Therapy (Signed)
[facility] [address] [place], [place], [address] Phone: [phone]   Fax:  [phone]  Physical Therapy Treatment  Patient Details  Name: Jillian Reyes MRN: [id number] Date of Birth: 04/05/1990 Referring Provider (PT): Foster Simpson, DO   Encounter Date: 09/13/2020   PT End of Session - 09/13/20 0959     Visit Number 2    Number of Visits 6    Date for PT Re-Evaluation 10/18/20    Authorization Type BCBS    PT Start Time 0959    PT Stop Time 1042    PT Time Calculation (min) 43 min    Activity Tolerance No increased pain;Patient tolerated treatment well    Behavior During Therapy [facility] for tasks assessed/performed             Past Medical History:  Diagnosis Date   Acne    Asthma due to environmental allergies    per pt moderate, prn inhaler,  followed by pcp   Bipolar 1 disorder (HCC)    Chronic back pain    Chronic pelvic pain in female    Dyslipidemia 02/18/2013   Eczema    GAD (generalized anxiety disorder)    GERD (gastroesophageal reflux disease)    History of abnormal cervical Pap smear    History of chlamydia 2015   History of gestational diabetes    Irregular periods/menstrual cycles    Migraines    PCOS (polycystic ovarian syndrome)    Vitamin D deficiency    Wears glasses     Past Surgical History:  Procedure Laterality Date   HYSTEROSCOPY WITH NOVASURE N/A 07/06/2020   Procedure: HYSTEROSCOPY WITH NOVASURE ABLATION, DILITATION AND CURETTAGE;  Surgeon: Jerene Bears, MD;  Location[facility];  Service: Gynecology;  Laterality: N/A;   INCISION AND DRAINAGE Right 01/12/2019   Procedure: INCISION AND DRAINAGE OF LESION;  Surgeon: Jerene Bears, MD;  Location: [facility];  Service: Gynecology;  Laterality: Right;   KNEE ARTHROSCOPY Right 04-16-2019  @[facility]    LAPAROSCOPY N/A 01/12/2019   Procedure: LAPAROSCOPY DIAGNOSTIC; LYSIS OF ADHESIONS;  Surgeon: 01/14/2019, MD;  Location[facility];  Service: Gynecology;  Laterality: N/A;  possible treatement of endometriosis   WISDOM TOOTH EXTRACTION  2016    There were no vitals filed for this visit.   Subjective Assessment - 09/13/20 1002     Subjective Patient reports moderate compliance with HEP, no particular improvement, the cervical retraction/extension causes pain, advised to stop.    Currently in Pain? Yes    Pain Score 5     Pain Location Neck    Pain Orientation Mid;Lateral    Pain Descriptors / Indicators Aching;Throbbing                [facility] PT Assessment - 09/13/20 0001       Assessment   Medical Diagnosis M62 Mammary Hypertrophy, M54.2 cervicalgia, M54.9 Dorsalgia, shoulder pain    Referring Provider (PT) 09/15/20, DO    Onset Date/Surgical Date 03/20/11    Hand Dominance Right;Left    Next MD Visit TBD      Precautions   Precautions None                           [facility] Adult PT Treatment/Exercise - 09/13/20 0001       Neck Exercises: Supine   Shoulder Flexion 10 reps    Shoulder  Flexion Limitations Alt Flex/Ext on Grey foam 2sets    Other Supine Exercise Horiz ABD/ADD on Grey foam 10x2sets      Lumbar Exercises: Quadruped   Madcat/Old Horse 5 reps    Madcat/Old Horse Limitations 5sec hold    Other Quadruped Lumbar Exercises Bent over row 6# 2x10ea      Shoulder Exercises: Supine   Protraction 10 reps    Protraction Weight (lbs) 6    Protraction Limitations 2sets      Neck Exercises: Stretches   Upper Trapezius Stretch 2 reps;30 seconds    Chest Stretch 2 reps    Chest Stretch Limitations Doorway "W"                    PT Education - 09/13/20 1018     Education Details Addition of chest stretch in doorway, discontinue cervical retraction/extension. Discussed proper body mechanics.    Person(s) Educated Patient    Methods Explanation;Demonstration;Verbal cues;Handout    Comprehension Verbalized  understanding              PT Short Term Goals - 09/13/20 1035       PT SHORT TERM GOAL #1   Title Patient will be independent with initial HEP and pain management techniques.    Baseline No exercise program    Time 2    Period Weeks    Status Achieved               PT Long Term Goals - 09/06/20 2103       PT LONG TERM GOAL #1   Title Patient will be independent with final HEP and progression to self manage symptoms.    Baseline No exercise program.    Time 6    Period Weeks    Status New    Target Date 10/18/20      PT LONG TERM GOAL #2   Title Patient will place 10 pound weight from floor to head high shelf with proper body mechanics without cues with no more than 3/10 pain.    Baseline Pain with lifting light objects from floor.    Time 6    Period Weeks    Status New    Target Date 10/18/20      PT LONG TERM GOAL #3   Title Patient will report standing >1 hour with no more than 3/10 pain for improved household tasks.    Baseline Less than 45 min to discomfort, difficulty washing dishes.    Time 6    Period Weeks    Status New    Target Date 10/18/20      PT LONG TERM GOAL #4   Title -      PT LONG TERM GOAL #5   Title -                   Plan - 09/13/20 1030     Clinical Impression Statement Patient has made limited progress so far, but some improved flexibility. Patient responded well to UT/levator stretch.  Patient could benefit from continued skilled PT to address deficits and maximize daily tasks and work at [employer] with decrased pain.    Examination-Activity Limitations Bend;Lift;Reach Overhead;Stand;Sit    Examination-Participation Restrictions Cleaning;Community Activity;Laundry;Occupation    PT Treatment/Interventions ADLs/Self Care Home Management;Aquatic Therapy;Cryotherapy;Electrical Stimulation;Moist Heat;Traction;Functional mobility training;Therapeutic activities;Therapeutic exercise;Neuromuscular  re-education;Patient/family education;Manual techniques;Dry needling;Energy conservation;Spinal Manipulations;Joint Manipulations    PT Next Visit Plan Assess affects of HEP, educate on proper body mechanics, manual treatment,  core and scapular stability.    PT Home Exercise Plan Access Code: [email or web address]    Consulted and Agree with Plan of Care Patient             Patient will benefit from skilled therapeutic intervention in order to improve the following deficits and impairments:  Decreased range of motion, Impaired UE functional use, Decreased activity tolerance, Pain, Impaired flexibility, Improper body mechanics, Postural dysfunction  Visit Diagnosis: Chronic bilateral low back pain without sciatica  Pain in thoracic spine  Cervicalgia     Problem List Patient Active Problem List   Diagnosis Date Noted   Symptomatic mammary hypertrophy 08/09/2020   PCOS (polycystic ovarian syndrome) 10/16/2019   Grief reaction 05/27/2019   Painful coitus, female 12/05/2018   Tachycardia 07/14/2018   Hypocalcemia 12/22/2016   Right hand pain 12/22/2016   Bilateral calf pain 12/21/2016   Right knee pain 12/21/2016   Closed nondisplaced fracture of distal phalanx of right ring finger 12/07/2016   Hemorrhoids 12/01/2016   History of hidradenitis suppurativa 10/14/2016   Abdominal pain 10/12/2016   Left ankle sprain 04/10/2016   Folliculitis 04/10/2016   Bilateral neck pain 03/16/2016   Preventative health care 04/10/2015   Vitamin D deficiency 04/10/2015   Dysuria 01/10/2014   Allergic rhinitis 11/30/2013   Migraine without aura and without status migrainosus, not intractable 11/17/2013   Encounter for preconception consultation 07/27/2013   Insomnia 07/02/2013   Tendinopathy of rotator cuff 03/08/2013   Dyslipidemia 02/18/2013   Contraceptive management 01/06/2012   Eczema 01/06/2012   Acne 01/06/2012   Hx of gestational diabetes mellitus, not currently pregnant    Mid back pain     Allergic state    Abnormal cells of cervix    Anxiety as acute reaction to exceptional stress 05/01/2011   Bipolar 1 disorder (HCC) 07/05/2010    Myrla Halsted, PT 09/13/2020, 10:45 AM  [facility] [address] [place], [place], [address] Phone: [phone]   Fax:  [phone]  Name: Jillian Reyes MRN: [id number] Date of Birth: [birth year]

## 2020-09-13 NOTE — Patient Instructions (Signed)
Access Code: [email or web address] URL: [email or web address] Date: 09/13/2020 Prepared by: Myrla Halsted  Exercises  Doorway Pec Stretch at 60 Elevation - 1 x daily - 7 x weekly - 2 sets - 30sec hold

## 2020-09-20 ENCOUNTER — Other Ambulatory Visit (HOSPITAL_BASED_OUTPATIENT_CLINIC_OR_DEPARTMENT_OTHER): Payer: Federal, State, Local not specified - PPO

## 2020-09-20 ENCOUNTER — Encounter: Payer: Self-pay | Admitting: Physical Therapy

## 2020-09-20 ENCOUNTER — Other Ambulatory Visit: Payer: Self-pay

## 2020-09-20 ENCOUNTER — Ambulatory Visit: Payer: Federal, State, Local not specified - PPO | Attending: Plastic Surgery | Admitting: Physical Therapy

## 2020-09-20 DIAGNOSIS — G8929 Other chronic pain: Secondary | ICD-10-CM

## 2020-09-20 DIAGNOSIS — M25661 Stiffness of right knee, not elsewhere classified: Secondary | ICD-10-CM

## 2020-09-20 DIAGNOSIS — M546 Pain in thoracic spine: Secondary | ICD-10-CM

## 2020-09-20 DIAGNOSIS — M25561 Pain in right knee: Secondary | ICD-10-CM

## 2020-09-20 DIAGNOSIS — M6281 Muscle weakness (generalized): Secondary | ICD-10-CM

## 2020-09-20 DIAGNOSIS — M542 Cervicalgia: Secondary | ICD-10-CM

## 2020-09-20 DIAGNOSIS — M545 Low back pain, unspecified: Secondary | ICD-10-CM | POA: Insufficient documentation

## 2020-09-20 DIAGNOSIS — R2689 Other abnormalities of gait and mobility: Secondary | ICD-10-CM | POA: Insufficient documentation

## 2020-09-20 NOTE — Patient Instructions (Signed)
Access Code: [email or web address] URL: [email or web address] Date: 09/20/2020 Prepared by: Myrla Halsted  NEW Exercises  Scapular Retraction with Resistance - 1 x daily - 7 x weekly - 2 sets - 10 reps - 5sec hold

## 2020-09-20 NOTE — Therapy (Signed)
[facility] Outpatient Rehabilitation [facility] [address] [place], [place], [address] Phone: [phone]   Fax:  [phone]  Physical Therapy Treatment  Patient Details  Name: Jillian Reyes MRN: [id number] Date of Birth: 05-18-1990 Referring Provider (PT): Foster Simpson, DO   Encounter Date: 09/20/2020   PT End of Session - 09/20/20 1022     Visit Number 3    Number of Visits 6    Date for PT Re-Evaluation 10/18/20    Authorization Type BCBS    Progress Note Due on Visit 10    PT Start Time 1000    PT Stop Time 1043    PT Time Calculation (min) 43 min    Activity Tolerance No increased pain;Patient tolerated treatment well    Behavior During Therapy [facility] for tasks assessed/performed             Past Medical History:  Diagnosis Date   Acne    Asthma due to environmental allergies    per pt moderate, prn inhaler,  followed by pcp   Bipolar 1 disorder (HCC)    Chronic back pain    Chronic pelvic pain in female    Dyslipidemia 02/18/2013   Eczema    GAD (generalized anxiety disorder)    GERD (gastroesophageal reflux disease)    History of abnormal cervical Pap smear    History of chlamydia 2015   History of gestational diabetes    Irregular periods/menstrual cycles    Migraines    PCOS (polycystic ovarian syndrome)    Vitamin D deficiency    Wears glasses     Past Surgical History:  Procedure Laterality Date   HYSTEROSCOPY WITH NOVASURE N/A 07/06/2020   Procedure: HYSTEROSCOPY WITH NOVASURE ABLATION, DILITATION AND CURETTAGE;  Surgeon: Jerene Bears, MD;  Location: [facility];  Service: Gynecology;  Laterality: N/A;   INCISION AND DRAINAGE Right 01/12/2019   Procedure: INCISION AND DRAINAGE OF LESION;  Surgeon: Jerene Bears, MD;  Location: [facility] [place];  Service: Gynecology;  Laterality: Right;   KNEE ARTHROSCOPY Right 04-16-2019  @[facility]    LAPAROSCOPY N/A 01/12/2019   Procedure: LAPAROSCOPY DIAGNOSTIC;  LYSIS OF ADHESIONS;  Surgeon: 01/14/2019, MD;  Location: [facility] [place];  Service: Gynecology;  Laterality: N/A;  possible treatement of endometriosis   WISDOM TOOTH EXTRACTION  2016    There were no vitals filed for this visit.   Subjective Assessment - 09/20/20 1000     Subjective Patient reports the stretching feels good for a while but no particular improvement overall.  The top of the shoulders are really tender right now.    Currently in Pain? Yes    Pain Score 5     Pain Location Neck                [facility] PT Assessment - 09/20/20 0001       Assessment   Medical Diagnosis M62 Mammary Hypertrophy, M54.2 cervicalgia, M54.9 Dorsalgia, shoulder pain    Referring Provider (PT) 11/21/20, DO    Onset Date/Surgical Date 03/20/11    Hand Dominance Right;Left      Precautions   Precautions None                           [facility] Adult PT Treatment/Exercise - 09/20/20 0001       Neck Exercises: Supine   Shoulder Flexion 10 reps    Shoulder Flexion Limitations Alt Flex/Ext  on Grey foam 2sets    Other Supine Exercise Horiz ABD/ADD on Grey foam 10x2sets      Lumbar Exercises: Standing   Scapular Retraction 10 reps;Both    Theraband Level (Scapular Retraction) Level 4 (Blue)    Shoulder Extension 10 reps;Both    Theraband Level (Shoulder Extension) Level 4 (Blue)    Other Standing Lumbar Exercises Trunk rotation, resisted with GREEN 2x10ea side      Lumbar Exercises: Sidelying   Other Sidelying Lumbar Exercises Open door R/L x10      Lumbar Exercises: Quadruped   Madcat/Old Horse 5 reps    Madcat/Old Horse Limitations 5sec hold    Single Arm Raise 10 reps    Straight Leg Raise 10 reps    Opposite Arm/Leg Raise 10 reps      Shoulder Exercises: Supine   Protraction 10 reps    Protraction Weight (lbs) 7    Protraction Limitations 2sets                    PT Education - 09/20/20 1020     Education Details Replace  scap retraction with resisted scap retraction. Patient given BLUE band.    Person(s) Educated Patient    Methods Explanation;Demonstration;Handout;Verbal cues    Comprehension Verbalized understanding;Returned demonstration              PT Short Term Goals - 09/13/20 1035       PT SHORT TERM GOAL #1   Title Patient will be independent with initial HEP and pain management techniques.    Baseline No exercise program    Time 2    Period Weeks    Status Achieved               PT Long Term Goals - 09/06/20 2103       PT LONG TERM GOAL #1   Title Patient will be independent with final HEP and progression to self manage symptoms.    Baseline No exercise program.    Time 6    Period Weeks    Status New    Target Date 10/18/20      PT LONG TERM GOAL #2   Title Patient will place 10 pound weight from floor to head high shelf with proper body mechanics without cues with no more than 3/10 pain.    Baseline Pain with lifting light objects from floor.    Time 6    Period Weeks    Status New    Target Date 10/18/20      PT LONG TERM GOAL #3   Title Patient will report standing >1 hour with no more than 3/10 pain for improved household tasks.    Baseline Less than 45 min to discomfort, difficulty washing dishes.    Time 6    Period Weeks    Status New    Target Date 10/18/20      PT LONG TERM GOAL #4   Title -      PT LONG TERM GOAL #5   Title -                   Plan - 09/20/20 1023     Clinical Impression Statement Patient continues to make limited overall progress with some temporary symptom management has improved.  Patient could benefit from continued skilled PT to address deficits and maximize daily tasks and work at [employer] with decreased pain.    Examination-Activity Limitations Bend;Lift;Reach Overhead;Stand;Sit  Examination-Participation Restrictions Cleaning;Community Activity;Laundry;Occupation    PT Treatment/Interventions ADLs/Self Care  Home Management;Aquatic Therapy;Cryotherapy;Electrical Stimulation;Moist Heat;Traction;Functional mobility training;Therapeutic activities;Therapeutic exercise;Neuromuscular re-education;Patient/family education;Manual techniques;Dry needling;Energy conservation;Spinal Manipulations;Joint Manipulations    PT Next Visit Plan Assess affects of HEP, educate on proper body mechanics, manual treatment, core and scapular stability.    PT Home Exercise Plan Access Code: [email or web address]    Consulted and Agree with Plan of Care Patient             Patient will benefit from skilled therapeutic intervention in order to improve the following deficits and impairments:  Decreased range of motion, Impaired UE functional use, Decreased activity tolerance, Pain, Impaired flexibility, Improper body mechanics, Postural dysfunction  Visit Diagnosis: Chronic bilateral low back pain without sciatica  Pain in thoracic spine  Stiffness of right knee, not elsewhere classified  Cervicalgia  Acute pain of right knee  Muscle weakness (generalized)     Problem List Patient Active Problem List   Diagnosis Date Noted   Symptomatic mammary hypertrophy 08/09/2020   PCOS (polycystic ovarian syndrome) 10/16/2019   Grief reaction 05/27/2019   Painful coitus, female 12/05/2018   Tachycardia 07/14/2018   Hypocalcemia 12/22/2016   Right hand pain 12/22/2016   Bilateral calf pain 12/21/2016   Right knee pain 12/21/2016   Closed nondisplaced fracture of distal phalanx of right ring finger 12/07/2016   Hemorrhoids 12/01/2016   History of hidradenitis suppurativa 10/14/2016   Abdominal pain 10/12/2016   Left ankle sprain 04/10/2016   Folliculitis 04/10/2016   Bilateral neck pain 03/16/2016   Preventative health care 04/10/2015   Vitamin D deficiency 04/10/2015   Dysuria 01/10/2014   Allergic rhinitis 11/30/2013   Migraine without aura and without status migrainosus, not intractable 11/17/2013   Encounter for  preconception consultation 07/27/2013   Insomnia 07/02/2013   Tendinopathy of rotator cuff 03/08/2013   Dyslipidemia 02/18/2013   Contraceptive management 01/06/2012   Eczema 01/06/2012   Acne 01/06/2012   Hx of gestational diabetes mellitus, not currently pregnant    Mid back pain    Allergic state    Abnormal cells of cervix    Anxiety as acute reaction to exceptional stress 05/01/2011   Bipolar 1 disorder (HCC) 07/05/2010    Myrla Halsted, PT 09/20/2020, 10:41 AM  [facility] [address] [place], [place], [address] Phone: [phone]   Fax:  [phone]  Name: Jillian Reyes MRN: [id number] Date of Birth: Sep 11, 1990

## 2020-09-27 ENCOUNTER — Encounter: Payer: Self-pay | Admitting: Physical Therapy

## 2020-09-27 ENCOUNTER — Ambulatory Visit: Payer: Federal, State, Local not specified - PPO | Admitting: Physical Therapy

## 2020-09-27 ENCOUNTER — Other Ambulatory Visit: Payer: Self-pay

## 2020-09-27 DIAGNOSIS — M546 Pain in thoracic spine: Secondary | ICD-10-CM | POA: Diagnosis not present

## 2020-09-27 DIAGNOSIS — R2689 Other abnormalities of gait and mobility: Secondary | ICD-10-CM | POA: Diagnosis not present

## 2020-09-27 DIAGNOSIS — M545 Low back pain, unspecified: Secondary | ICD-10-CM

## 2020-09-27 DIAGNOSIS — M542 Cervicalgia: Secondary | ICD-10-CM | POA: Diagnosis not present

## 2020-09-27 DIAGNOSIS — M6281 Muscle weakness (generalized): Secondary | ICD-10-CM

## 2020-09-27 DIAGNOSIS — G8929 Other chronic pain: Secondary | ICD-10-CM

## 2020-09-27 DIAGNOSIS — M25661 Stiffness of right knee, not elsewhere classified: Secondary | ICD-10-CM | POA: Diagnosis not present

## 2020-09-27 DIAGNOSIS — M25561 Pain in right knee: Secondary | ICD-10-CM | POA: Diagnosis not present

## 2020-09-27 NOTE — Patient Instructions (Signed)
Access Code: [email or web address] URL: [email or web address] Date: 09/27/2020 Prepared by: Myrla Halsted  NEW Exercises  Isometric Dead Bug - 1 x daily - 7 x weekly - 10 reps - 10sec hold

## 2020-09-27 NOTE — Therapy (Signed)
[facility] Outpatient Rehabilitation [facility] [address] [place], [place], [address] Phone: [phone]   Fax:  [phone]  Physical Therapy Treatment  Patient Details  Name: Jillian Reyes MRN: [id number] Date of Birth: [birth year] Referring Provider (PT): Foster Simpson, DO   Encounter Date: 09/27/2020   PT End of Session - 09/27/20 0910     Visit Number 4    Number of Visits 6    Date for PT Re-Evaluation 10/18/20    Authorization Type BCBS    Progress Note Due on Visit 10    PT Start Time 0910    PT Stop Time 0950    PT Time Calculation (min) 40 min    Activity Tolerance No increased pain;Patient tolerated treatment well    Behavior During Therapy [facility] for tasks assessed/performed             Past Medical History:  Diagnosis Date   Acne    Asthma due to environmental allergies    per pt moderate, prn inhaler,  followed by pcp   Bipolar 1 disorder (HCC)    Chronic back pain    Chronic pelvic pain in female    Dyslipidemia 02/18/2013   Eczema    GAD (generalized anxiety disorder)    GERD (gastroesophageal reflux disease)    History of abnormal cervical Pap smear    History of chlamydia 2015   History of gestational diabetes    Irregular periods/menstrual cycles    Migraines    PCOS (polycystic ovarian syndrome)    Vitamin D deficiency    Wears glasses     Past Surgical History:  Procedure Laterality Date   HYSTEROSCOPY WITH NOVASURE N/A 07/06/2020   Procedure: HYSTEROSCOPY WITH NOVASURE ABLATION, DILITATION AND CURETTAGE;  Surgeon: Jerene Bears, MD;  Location: [facility];  Service: Gynecology;  Laterality: N/A;   INCISION AND DRAINAGE Right 01/12/2019   Procedure: INCISION AND DRAINAGE OF LESION;  Surgeon: Jerene Bears, MD;  Location: [facility] [place];  Service: Gynecology;  Laterality: Right;   KNEE ARTHROSCOPY Right 04-16-2019  @[facility]    LAPAROSCOPY N/A 01/12/2019   Procedure: LAPAROSCOPY DIAGNOSTIC;  LYSIS OF ADHESIONS;  Surgeon: 01/14/2019, MD;  Location: [facility] [place];  Service: Gynecology;  Laterality: N/A;  possible treatement of endometriosis   WISDOM TOOTH EXTRACTION  2016    There were no vitals filed for this visit.   Subjective Assessment - 09/27/20 0918     Subjective Patient reports the stretching feels good for a while but no particular improvement overall.  The top of the shoulders are really tender right now.    Currently in Pain? Yes    Pain Score 4     Pain Location Neck    Pain Orientation Mid;Lateral    Pain Descriptors / Indicators Aching;Throbbing    Pain Type Chronic pain                [facility] PT Assessment - 09/27/20 0001       Assessment   Medical Diagnosis M62 Mammary Hypertrophy, M54.2 cervicalgia, M54.9 Dorsalgia, shoulder pain    Referring Provider (PT) 11/28/20, DO    Onset Date/Surgical Date 03/20/11    Hand Dominance Right;Left      Precautions   Precautions None                           [facility] Adult PT Treatment/Exercise - 09/27/20 0001  Neck Exercises: Supine   Other Supine Exercise Horiz ABD/ADD on Grey foam 10x2sets      Lumbar Exercises: Standing   Other Standing Lumbar Exercises Trunk rotation, resisted with GREEN 2x10ea side      Lumbar Exercises: Supine   Dead Bug 10 reps    Dead Bug Limitations Static x10sec hold      Lumbar Exercises: Sidelying   Other Sidelying Lumbar Exercises Open door R/L x10      Lumbar Exercises: Quadruped   Other Quadruped Lumbar Exercises PRONE on ball Ys and Ts      Neck Exercises: Stretches   Upper Trapezius Stretch 2 reps;30 seconds    Chest Stretch 2 reps    Chest Stretch Limitations Doorway "W"                    PT Education - 09/27/20 0938     Education Details Replace Bird Dog with Dead Bug    Person(s) Educated Patient    Methods Explanation;Demonstration;Verbal cues;Handout    Comprehension Verbalized  understanding;Returned demonstration              PT Short Term Goals - 09/27/20 0946       PT SHORT TERM GOAL #2   Title Patient will be educated on proper body mechanics and only require min cues to perform.    Baseline No attention to body mechanics.    Time 3    Period Weeks    Status Achieved               PT Long Term Goals - 09/06/20 2103       PT LONG TERM GOAL #1   Title Patient will be independent with final HEP and progression to self manage symptoms.    Baseline No exercise program.    Time 6    Period Weeks    Status New    Target Date 10/18/20      PT LONG TERM GOAL #2   Title Patient will place 10 pound weight from floor to head high shelf with proper body mechanics without cues with no more than 3/10 pain.    Baseline Pain with lifting light objects from floor.    Time 6    Period Weeks    Status New    Target Date 10/18/20      PT LONG TERM GOAL #3   Title Patient will report standing >1 hour with no more than 3/10 pain for improved household tasks.    Baseline Less than 45 min to discomfort, difficulty washing dishes.    Time 6    Period Weeks    Status New    Target Date 10/18/20      PT LONG TERM GOAL #4   Title -      PT LONG TERM GOAL #5   Title -                   Plan - 09/27/20 0939     Clinical Impression Statement Patient continues with slow/limited progress with overall back pain, able to manage pain temporarily with stretches.  Patient could benefit from continued skilled PT to address deficits and maximize daily tasks with decreased pain.    Comorbidities LBP, anxiety/depression, asthma, sleep d/o    Examination-Activity Limitations Bend;Lift;Reach Overhead;Stand;Sit    Examination-Participation Restrictions Cleaning;Community Activity;Laundry;Occupation    PT Treatment/Interventions ADLs/Self Care Home Management;Aquatic Therapy;Cryotherapy;Electrical Stimulation;Moist Heat;Traction;Functional mobility  training;Therapeutic activities;Therapeutic exercise;Neuromuscular re-education;Patient/family education;Manual techniques;Dry needling;Energy conservation;Spinal  Manipulations;Joint Manipulations    PT Next Visit Plan Assess affects of HEP, manual treatment, core and scapular stability, progress dead bug and Y's/T's, consider superman's for home secondary Bird Dog causes knee pain.    PT Home Exercise Plan Access Code: [email or web address]    Consulted and Agree with Plan of Care Patient             Patient will benefit from skilled therapeutic intervention in order to improve the following deficits and impairments:  Decreased range of motion, Impaired UE functional use, Decreased activity tolerance, Pain, Impaired flexibility, Improper body mechanics, Postural dysfunction  Visit Diagnosis: Chronic bilateral low back pain without sciatica  Pain in thoracic spine  Stiffness of right knee, not elsewhere classified  Cervicalgia  Muscle weakness (generalized)  Other abnormalities of gait and mobility     Problem List Patient Active Problem List   Diagnosis Date Noted   Symptomatic mammary hypertrophy 08/09/2020   PCOS (polycystic ovarian syndrome) 10/16/2019   Grief reaction 05/27/2019   Painful coitus, female 12/05/2018   Tachycardia 07/14/2018   Hypocalcemia 12/22/2016   Right hand pain 12/22/2016   Bilateral calf pain 12/21/2016   Right knee pain 12/21/2016   Closed nondisplaced fracture of distal phalanx of right ring finger 12/07/2016   Hemorrhoids 12/01/2016   History of hidradenitis suppurativa 10/14/2016   Abdominal pain 10/12/2016   Left ankle sprain 04/10/2016   Folliculitis 04/10/2016   Bilateral neck pain 03/16/2016   Preventative health care 04/10/2015   Vitamin D deficiency 04/10/2015   Dysuria 01/10/2014   Allergic rhinitis 11/30/2013   Migraine without aura and without status migrainosus, not intractable 11/17/2013   Encounter for preconception consultation  07/27/2013   Insomnia 07/02/2013   Tendinopathy of rotator cuff 03/08/2013   Dyslipidemia 02/18/2013   Contraceptive management 01/06/2012   Eczema 01/06/2012   Acne 01/06/2012   Hx of gestational diabetes mellitus, not currently pregnant    Mid back pain    Allergic state    Abnormal cells of cervix    Anxiety as acute reaction to exceptional stress 05/01/2011   Bipolar 1 disorder (HCC) 07/05/2010    Myrla Halsted, PT 09/27/2020, 9:54 AM  [facility] [address] [place], [place], [address] Phone: [phone]   Fax:  [phone]  Name: Jillian Reyes MRN: [id number] Date of Birth: [birth year]

## 2020-09-29 ENCOUNTER — Other Ambulatory Visit: Payer: Self-pay | Admitting: Obstetrics & Gynecology

## 2020-09-29 NOTE — Telephone Encounter (Signed)
Pt to come in for Vit D level on 7/18

## 2020-10-03 ENCOUNTER — Ambulatory Visit (HOSPITAL_BASED_OUTPATIENT_CLINIC_OR_DEPARTMENT_OTHER): Payer: Federal, State, Local not specified - PPO

## 2020-10-04 ENCOUNTER — Other Ambulatory Visit: Payer: Self-pay

## 2020-10-04 ENCOUNTER — Ambulatory Visit: Payer: Federal, State, Local not specified - PPO | Admitting: Physical Therapy

## 2020-10-04 ENCOUNTER — Encounter: Payer: Self-pay | Admitting: Physical Therapy

## 2020-10-04 DIAGNOSIS — M546 Pain in thoracic spine: Secondary | ICD-10-CM

## 2020-10-04 DIAGNOSIS — G8929 Other chronic pain: Secondary | ICD-10-CM | POA: Diagnosis not present

## 2020-10-04 DIAGNOSIS — M25661 Stiffness of right knee, not elsewhere classified: Secondary | ICD-10-CM

## 2020-10-04 DIAGNOSIS — M545 Low back pain, unspecified: Secondary | ICD-10-CM | POA: Diagnosis not present

## 2020-10-04 DIAGNOSIS — R2689 Other abnormalities of gait and mobility: Secondary | ICD-10-CM | POA: Diagnosis not present

## 2020-10-04 DIAGNOSIS — M25561 Pain in right knee: Secondary | ICD-10-CM | POA: Diagnosis not present

## 2020-10-04 DIAGNOSIS — M6281 Muscle weakness (generalized): Secondary | ICD-10-CM | POA: Diagnosis not present

## 2020-10-04 DIAGNOSIS — M542 Cervicalgia: Secondary | ICD-10-CM | POA: Diagnosis not present

## 2020-10-04 NOTE — Therapy (Signed)
[facility] [address] [place], [place], [address] Phone: [phone]   Fax:  [phone]  Physical Therapy Treatment  Patient Details  Name: Jillian Reyes MRN: [id number] Date of Birth: Nov 16, 1990 Referring Provider (PT): Foster Simpson, DO   Encounter Date: 10/04/2020   PT End of Session - 10/04/20 0919     Visit Number 5    Number of Visits 6    Date for PT Re-Evaluation 10/18/20    Authorization Type BCBS    Progress Note Due on Visit 10    PT Start Time 0920    PT Stop Time 1000    PT Time Calculation (min) 40 min    Activity Tolerance No increased pain;Patient tolerated treatment well    Behavior During Therapy [facility] for tasks assessed/performed             Past Medical History:  Diagnosis Date   Acne    Asthma due to environmental allergies    per pt moderate, prn inhaler,  followed by pcp   Bipolar 1 disorder (HCC)    Chronic back pain    Chronic pelvic pain in female    Dyslipidemia 02/18/2013   Eczema    GAD (generalized anxiety disorder)    GERD (gastroesophageal reflux disease)    History of abnormal cervical Pap smear    History of chlamydia 2015   History of gestational diabetes    Irregular periods/menstrual cycles    Migraines    PCOS (polycystic ovarian syndrome)    Vitamin D deficiency    Wears glasses     Past Surgical History:  Procedure Laterality Date   HYSTEROSCOPY WITH NOVASURE N/A 07/06/2020   Procedure: HYSTEROSCOPY WITH NOVASURE ABLATION, DILITATION AND CURETTAGE;  Surgeon: Jerene Bears, MD;  Location: [facility] [place];  Service: Gynecology;  Laterality: N/A;   INCISION AND DRAINAGE Right 01/12/2019   Procedure: INCISION AND DRAINAGE OF LESION;  Surgeon: Jerene Bears, MD;  Location: [facility];  Service: Gynecology;  Laterality: Right;   KNEE ARTHROSCOPY Right 04-16-2019  @[facility]    LAPAROSCOPY N/A 01/12/2019   Procedure: LAPAROSCOPY DIAGNOSTIC;  LYSIS OF ADHESIONS;  Surgeon: 01/14/2019, MD;  Location: [facility] ;  Service: Gynecology;  Laterality: N/A;  possible treatement of endometriosis   WISDOM TOOTH EXTRACTION  2016    There were no vitals filed for this visit.   Subjective Assessment - 10/04/20 0922     Subjective Patient reiterates the HEP is going well.  She reports over the weekend she was wearing her regular bra for work plus trip and had significant increased pain and dent in shoulder at bra strap.    Currently in Pain? Yes    Pain Score 6    Max over the weekned = 8/10   Pain Location Neck    Pain Score 6    Pain Location Shoulder                [facility] PT Assessment - 10/04/20 0001       Assessment   Medical Diagnosis M62 Mammary Hypertrophy, M54.2 cervicalgia, M54.9 Dorsalgia, shoulder pain    Referring Provider (PT) 10/06/20, DO    Onset Date/Surgical Date 03/20/11    Hand Dominance Right;Left      Precautions   Precautions None                           [facility]  Adult PT Treatment/Exercise - 10/04/20 0001       Lumbar Exercises: Standing   Scapular Retraction 10 reps;Both    Theraband Level (Scapular Retraction) Level 4 (Blue)    Other Standing Lumbar Exercises Triceps press BLUE 2x10      Lumbar Exercises: Supine   Dead Bug 10 reps    Dead Bug Limitations Static x10sec hold      Lumbar Exercises: Sidelying   Other Sidelying Lumbar Exercises Open door R/L x10      Lumbar Exercises: Quadruped   Other Quadruped Lumbar Exercises PRONE on ball Ys and Ts      Manual Therapy   Manual Therapy Soft tissue mobilization    Soft tissue mobilization STM B UT/levator/periscap, mid scap region      Neck Exercises: Stretches   Upper Trapezius Stretch 2 reps;30 seconds    Chest Stretch 2 reps    Chest Stretch Limitations Doorway "W"                    PT Education - 10/04/20 0940     Education Details Patient to continue with current HEP,  focus on stretches.    Person(s) Educated Patient    Methods Explanation;Demonstration    Comprehension Verbalized understanding              PT Short Term Goals - 09/27/20 0946       PT SHORT TERM GOAL #2   Title Patient will be educated on proper body mechanics and only require min cues to perform.    Baseline No attention to body mechanics.    Time 3    Period Weeks    Status Achieved               PT Long Term Goals - 09/06/20 2103       PT LONG TERM GOAL #1   Title Patient will be independent with final HEP and progression to self manage symptoms.    Baseline No exercise program.    Time 6    Period Weeks    Status New    Target Date 10/18/20      PT LONG TERM GOAL #2   Title Patient will place 10 pound weight from floor to head high shelf with proper body mechanics without cues with no more than 3/10 pain.    Baseline Pain with lifting light objects from floor.    Time 6    Period Weeks    Status New    Target Date 10/18/20      PT LONG TERM GOAL #3   Title Patient will report standing >1 hour with no more than 3/10 pain for improved household tasks.    Baseline Less than 45 min to discomfort, difficulty washing dishes.    Time 6    Period Weeks    Status New    Target Date 10/18/20      PT LONG TERM GOAL #4   Title -      PT LONG TERM GOAL #5   Title -                   Plan - 10/04/20 0940     Clinical Impression Statement Patient continues with slow/limited progress with overall back pain, able to manage pain temporarily with stretches. Patient could benefit from continued skilled PT to address deficits and maximize daily tasks with decreased pain.    Comorbidities LBP, anxiety/depression, asthma, sleep d/o  Examination-Activity Limitations Bend;Lift;Reach Overhead;Stand;Sit    Examination-Participation Restrictions Cleaning;Community Activity;Laundry;Occupation    PT Treatment/Interventions ADLs/Self Care Home  Management;Aquatic Therapy;Cryotherapy;Electrical Stimulation;Moist Heat;Traction;Functional mobility training;Therapeutic activities;Therapeutic exercise;Neuromuscular re-education;Patient/family education;Manual techniques;Dry needling;Energy conservation;Spinal Manipulations;Joint Manipulations    PT Next Visit Plan Discharge next visit, review goals/objective measures    PT Home Exercise Plan Access Code: [email or web address]    Consulted and Agree with Plan of Care Patient             Patient will benefit from skilled therapeutic intervention in order to improve the following deficits and impairments:  Decreased range of motion, Impaired UE functional use, Decreased activity tolerance, Pain, Impaired flexibility, Improper body mechanics, Postural dysfunction  Visit Diagnosis: Chronic bilateral low back pain without sciatica  Pain in thoracic spine  Stiffness of right knee, not elsewhere classified  Cervicalgia     Problem List Patient Active Problem List   Diagnosis Date Noted   Symptomatic mammary hypertrophy 08/09/2020   PCOS (polycystic ovarian syndrome) 10/16/2019   Grief reaction 05/27/2019   Painful coitus, female 12/05/2018   Tachycardia 07/14/2018   Hypocalcemia 12/22/2016   Right hand pain 12/22/2016   Bilateral calf pain 12/21/2016   Right knee pain 12/21/2016   Closed nondisplaced fracture of distal phalanx of right ring finger 12/07/2016   Hemorrhoids 12/01/2016   History of hidradenitis suppurativa 10/14/2016   Abdominal pain 10/12/2016   Left ankle sprain 04/10/2016   Folliculitis 04/10/2016   Bilateral neck pain 03/16/2016   Preventative health care 04/10/2015   Vitamin D deficiency 04/10/2015   Dysuria 01/10/2014   Allergic rhinitis 11/30/2013   Migraine without aura and without status migrainosus, not intractable 11/17/2013   Encounter for preconception consultation 07/27/2013   Insomnia 07/02/2013   Tendinopathy of rotator cuff 03/08/2013   Dyslipidemia  02/18/2013   Contraceptive management 01/06/2012   Eczema 01/06/2012   Acne 01/06/2012   Hx of gestational diabetes mellitus, not currently pregnant    Mid back pain    Allergic state    Abnormal cells of cervix    Anxiety as acute reaction to exceptional stress 05/01/2011   Bipolar 1 disorder (HCC) 07/05/2010    Myrla Halsted, PT 10/04/2020, 10:03 AM  [facility] [address] [place], [place], [address] Phone: [phone]   Fax:  [phone]  Name: Jillian Reyes MRN: [id number] Date of Birth: [birth year]

## 2020-10-11 ENCOUNTER — Other Ambulatory Visit: Payer: Self-pay

## 2020-10-11 ENCOUNTER — Ambulatory Visit: Payer: Federal, State, Local not specified - PPO | Admitting: Physical Therapy

## 2020-10-11 ENCOUNTER — Encounter: Payer: Self-pay | Admitting: Physical Therapy

## 2020-10-11 DIAGNOSIS — R2689 Other abnormalities of gait and mobility: Secondary | ICD-10-CM

## 2020-10-11 DIAGNOSIS — M546 Pain in thoracic spine: Secondary | ICD-10-CM | POA: Diagnosis not present

## 2020-10-11 DIAGNOSIS — M542 Cervicalgia: Secondary | ICD-10-CM

## 2020-10-11 DIAGNOSIS — M25661 Stiffness of right knee, not elsewhere classified: Secondary | ICD-10-CM

## 2020-10-11 DIAGNOSIS — G8929 Other chronic pain: Secondary | ICD-10-CM

## 2020-10-11 DIAGNOSIS — M545 Low back pain, unspecified: Secondary | ICD-10-CM | POA: Diagnosis not present

## 2020-10-11 DIAGNOSIS — M6281 Muscle weakness (generalized): Secondary | ICD-10-CM | POA: Diagnosis not present

## 2020-10-11 DIAGNOSIS — M25561 Pain in right knee: Secondary | ICD-10-CM | POA: Diagnosis not present

## 2020-10-11 NOTE — Therapy (Signed)
[place], [place], [address] Phone: [phone]   Fax:  [phone]  Physical Therapy Treatment/ DISCHARGE SUMMARY  Patient Details  Name: Jillian Reyes MRN: [id number] Date of Birth: 01-21-1991 Referring Provider (PT): Audelia Hives, DO   Encounter Date: 10/11/2020   PT End of Session - 10/11/20 0919     Visit Number 6    Number of Visits 6    Date for PT Re-Evaluation 10/18/20    Authorization Type BCBS    Progress Note Due on Visit 10    PT Start Time 0919    PT Stop Time 0947    PT Time Calculation (min) 28 min    Activity Tolerance No increased pain;Patient tolerated treatment well    Behavior During Therapy [facility] for tasks assessed/performed             Past Medical History:  Diagnosis Date   Acne    Asthma due to environmental allergies    per pt moderate, prn inhaler,  followed by pcp   Bipolar 1 disorder ([place])    Chronic back pain    Chronic pelvic pain in female    Dyslipidemia 02/18/2013   Eczema    GAD (generalized anxiety disorder)    GERD (gastroesophageal reflux disease)    History of abnormal cervical Pap smear    History of chlamydia 2015   History of gestational diabetes    Irregular periods/menstrual cycles    Migraines    PCOS (polycystic ovarian syndrome)    Vitamin D deficiency    Wears glasses     Past Surgical History:  Procedure Laterality Date   HYSTEROSCOPY WITH NOVASURE N/A 07/06/2020   Procedure: HYSTEROSCOPY WITH NOVASURE ABLATION, DILITATION AND CURETTAGE;  Surgeon: Megan Salon, MD;  Location: [place];  Service: Gynecology;  Laterality: N/A;   INCISION AND DRAINAGE Right 01/12/2019   Procedure: INCISION AND DRAINAGE OF LESION;  Surgeon: Megan Salon, MD;  Location: [place];  Service: Gynecology;  Laterality: Right;   KNEE ARTHROSCOPY Right 04-16-2019  '@[facility]'    LAPAROSCOPY N/A 01/12/2019   Procedure:  LAPAROSCOPY DIAGNOSTIC; LYSIS OF ADHESIONS;  Surgeon: Megan Salon, MD;  Location: [place];  Service: Gynecology;  Laterality: N/A;  possible treatement of endometriosis   WISDOM TOOTH EXTRACTION  2016    There were no vitals filed for this visit.   Subjective Assessment - 10/11/20 0922     Subjective Patient reports being tired, had a long weekend away camping.    Pain Score 6    Max=8/10 this week   Pain Location Neck    Pain Score 6    Pain Location Shoulder    Pain Orientation Upper                [facility] PT Assessment - 10/11/20 0001       Assessment   Medical Diagnosis M62 Mammary Hypertrophy, M54.2 cervicalgia, M54.9 Dorsalgia, shoulder pain    Referring Provider (PT) Audelia Hives, DO    Onset Date/Surgical Date 03/20/11      Precautions   Precautions None      Sensation   Light Touch Appears Intact      AROM   Lumbar Flexion 5cm    Lumbar Extension 75%    Lumbar - Right Side Bend 45cm    Lumbar - Left Side Bend 47cm    Lumbar - Right Rotation 75%    Lumbar -  Left Rotation 75%      Strength   Right Shoulder ABduction 5/5    Left Shoulder Flexion 5/5    Left Shoulder ABduction 5/5    Right Hip Flexion 5/5    Left Hip Flexion 5/5      Flexibility   Hamstrings R=65 L=75      Palpation   Palpation comment TTP B UT, cervical and upper thoracic and lumbar paraspinals, PA glides cervical upper thoracic and L2-L4      Transfers   Five time sit to stand comments  10sec, low risk of falls                           [facility] Adult PT Treatment/Exercise - 10/11/20 0001       Neck Exercises: Seated   Neck Retraction 10 reps    Neck Retraction Limitations Combo with extension      Lumbar Exercises: Supine   Dead Bug 10 reps    Dead Bug Limitations Static x10sec hold      Manual Therapy   Manual Therapy Soft tissue mobilization    Soft tissue mobilization STM B UT/levator/periscap, mid scap region                     PT Education - 10/11/20 0956     Education Details Goals reviewed and patient with good understanding of continuation and progression.    Person(s) Educated Patient    Methods Explanation;Demonstration    Comprehension Verbalized understanding;Returned demonstration              PT Short Term Goals - 10/11/20 0936       PT SHORT TERM GOAL #1   Title Patient will be independent with initial HEP and pain management techniques.    Status Achieved      PT SHORT TERM GOAL #2   Title Patient will be educated on proper body mechanics and only require min cues to perform.    Status Achieved      PT SHORT TERM GOAL #3   Title report < or = to 5/10 Lt neck pain with work activities    Status Not Met               PT Long Term Goals - 10/11/20 9528       PT LONG TERM GOAL #1   Title Patient will be independent with final HEP and progression to self manage symptoms.    Baseline No exercise program.    Status Achieved      PT LONG TERM GOAL #2   Title Patient will place 10 pound weight from floor to head high shelf with proper body mechanics without cues with no more than 3/10 pain.    Baseline Pain with lifting light objects from floor.    Status Partially Met   Able to perform, already at 6/10.     PT LONG TERM GOAL #3   Title Patient will report standing >1 hour with no more than 3/10 pain for improved household tasks.    Baseline Less than 45 min to discomfort, difficulty washing dishes.    Status Not Met                   Plan - 10/11/20 0949     Clinical Impression Statement Patient reports stretches help with trunk mobility especially with lower trunk, less so with shoulder/neck region. Patient continues to report UT/levator  region pain up to 8/10, typically 6/10 during activity and particularly uncomfortable in the bra strap area.  Patient has tried several different types of bras without success.  Patient has met 2/3 short term goals,  and partially met one long term goal, not achieving goals related to continued pain with activity.  Patient has completed PT program with limited success and will be discharged with referral back to MD for further treatment alternatives.    Comorbidities LBP, anxiety/depression, asthma, sleep d/o    Examination-Activity Limitations Bend;Lift;Reach Overhead;Stand;Sit    Examination-Participation Restrictions Cleaning;Community Activity;Laundry;Occupation    PT Treatment/Interventions ADLs/Self Care Home Management;Aquatic Therapy;Cryotherapy;Electrical Stimulation;Moist Heat;Traction;Functional mobility training;Therapeutic activities;Therapeutic exercise;Neuromuscular re-education;Patient/family education;Manual techniques;Dry needling;Energy conservation;Spinal Manipulations;Joint Manipulations    PT Next Visit Plan DISCHARGE this visit    PT Home Exercise Plan Access Code: [email or web address]    Consulted and Agree with Plan of Care Patient             Patient will benefit from skilled therapeutic intervention in order to improve the following deficits and impairments:  Decreased range of motion, Impaired UE functional use, Decreased activity tolerance, Pain, Impaired flexibility, Improper body mechanics, Postural dysfunction  Visit Diagnosis: Chronic bilateral low back pain without sciatica  Stiffness of right knee, not elsewhere classified  Cervicalgia  Muscle weakness (generalized)  Other abnormalities of gait and mobility  Acute pain of right knee  Pain in thoracic spine     Problem List Patient Active Problem List   Diagnosis Date Noted   Symptomatic mammary hypertrophy 08/09/2020   PCOS (polycystic ovarian syndrome) 10/16/2019   Grief reaction 05/27/2019   Painful coitus, female 12/05/2018   Tachycardia 07/14/2018   Hypocalcemia 12/22/2016   Right hand pain 12/22/2016   Bilateral calf pain 12/21/2016   Right knee pain 12/21/2016   Closed nondisplaced fracture of distal  phalanx of right ring finger 12/07/2016   Hemorrhoids 12/01/2016   History of hidradenitis suppurativa 10/14/2016   Abdominal pain 10/12/2016   Left ankle sprain 24/26/8341   Folliculitis 96/22/2979   Bilateral neck pain 03/16/2016   Preventative health care 04/10/2015   Vitamin D deficiency 04/10/2015   Dysuria 01/10/2014   Allergic rhinitis 11/30/2013   Migraine without aura and without status migrainosus, not intractable 11/17/2013   Encounter for preconception consultation 07/27/2013   Insomnia 07/02/2013   Tendinopathy of rotator cuff 03/08/2013   Dyslipidemia 02/18/2013   Contraceptive management 01/06/2012   Eczema 01/06/2012   Acne 01/06/2012   Hx of gestational diabetes mellitus, not currently pregnant    Mid back pain    Allergic state    Abnormal cells of cervix    Anxiety as acute reaction to exceptional stress 05/01/2011   Bipolar 1 disorder ([place]) 07/05/2010    Pollyann Samples, PT 10/11/2020, 9:57 AM  [facility] [address] [place], [place], [address] Phone: [phone]   Fax:  [phone]  Name: Jillian Reyes MRN: [id number] Date of Birth: June 21, 1990  PHYSICAL THERAPY DISCHARGE SUMMARY  Visits from Start of Care: 6  Current functional level related to goals / functional outcomes: Patient able to lift heavier objects from floor with good body mechanics and without increased pain.   Remaining deficits: Patient throughout the day particularly in upper back/neck/UT-levator region.   Education / Equipment: HEP   Patient agrees to discharge. Patient goals were partially met. Patient is being discharged due to maximized rehab potential.    Pollyann Samples, PT

## 2020-10-14 ENCOUNTER — Encounter: Payer: Self-pay | Admitting: Family Medicine

## 2020-10-24 ENCOUNTER — Encounter: Payer: Self-pay | Admitting: Physician Assistant

## 2020-10-24 NOTE — Progress Notes (Signed)
   Subjective:     Patient ID: Jillian Reyes, female    DOB: 1990/06/14, 30 y.o.   MRN: [id number]  Chief Complaint  Patient presents with   Follow-up    HPI: The patient is a 30 y.o. female here for follow-up for her bilateral neck discomfort related to macromastia.    Patient was last seen here in clinic on 08/09/2020.  At that time, her sternal to nipple distance was 34 cm bilaterally with IMF distance of 17 cm.  Preoperative bra size estimated at F cup.  Estimated excess breast tissue to be removed at time of surgery is 550 g bilaterally.  She has since completed a 6-week course of physical therapy.  She denies any tobacco use or personal/family history of breast cancer.  She takes Metformin for PCOS, but has not had progression to DM.  Patient continues to endorse bilateral trapezial region neck and upper back discomfort despite physical therapy.  She complains of continued indentations in the trapezial regions bilaterally.  She also continues to have inframammary rashes for which she is treated by dermatology with topical steroids.  Denies any other changes in her health and wellbeing.  She would still very much like to proceed with surgical intervention.  She would like to be a C, if not smaller.     Review of Systems  Constitutional:  Negative for fever.  Respiratory:  Negative for shortness of breath.   Cardiovascular:  Negative for chest pain.  Skin:  Negative for wound.    Objective:   Vital Signs There were no vitals taken for this visit. Vital Signs and Nursing Note Reviewed Chaperone present Physical Exam Exam conducted with a chaperone present.  Constitutional:      Appearance: Normal appearance.  Pulmonary:     Effort: Pulmonary effort is normal.  Musculoskeletal:        General: Normal range of motion.  Neurological:     Mental Status: She is alert and oriented to person, place, and time.     Gait: Gait normal.      Assessment/Plan:     ICD-10-CM    1. Macromastia  N62     2. Neck pain  M54.2        Patient is strong candidate for breast reduction surgery given her symptomatic macromastia. She has continued discomfort despite treatment with physical therapy.  Will notify our surgical coordinator and try to schedule her for later this year.  Addressed her questions here in clinic and recommend that she call us back with any additional questions or concerns.  She will receive a phone call from [place] in a few weeks regarding scheduling for surgical intervention as well as determination of in-clinic pre-operative history and physical exam.   Evelena Leyden, PA-C 10/25/2020, 8:50 AM

## 2020-10-25 ENCOUNTER — Ambulatory Visit: Payer: Federal, State, Local not specified - PPO | Admitting: Physician Assistant

## 2020-10-25 ENCOUNTER — Other Ambulatory Visit: Payer: Self-pay

## 2020-10-25 VITALS — Wt 184.6 lb

## 2020-10-25 DIAGNOSIS — M542 Cervicalgia: Secondary | ICD-10-CM

## 2020-10-25 DIAGNOSIS — N62 Hypertrophy of breast: Secondary | ICD-10-CM
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
## 2020-12-16 ENCOUNTER — Other Ambulatory Visit: Payer: Self-pay

## 2020-12-16 ENCOUNTER — Encounter: Payer: Self-pay | Admitting: Physician Assistant

## 2020-12-16 ENCOUNTER — Ambulatory Visit: Payer: Federal, State, Local not specified - PPO | Admitting: Physician Assistant

## 2020-12-16 VITALS — BP 112/73 | HR 93 | Ht 64.0 in | Wt 181.0 lb

## 2020-12-16 DIAGNOSIS — N62 Hypertrophy of breast: Secondary | ICD-10-CM

## 2020-12-16 MED ORDER — CEPHALEXIN 500 MG PO CAPS: 500.0000 mg | ORAL_CAPSULE | Freq: Four times a day (QID) | ORAL | 0 refills | Status: AC

## 2020-12-16 MED ORDER — HYDROCODONE-ACETAMINOPHEN 5-325 MG PO TABS: 1.0000 | ORAL_TABLET | Freq: Four times a day (QID) | ORAL | 0 refills | Status: AC | PRN

## 2020-12-16 MED ORDER — ONDANSETRON 4 MG PO TBDP: 4.0000 mg | ORAL_TABLET | Freq: Three times a day (TID) | ORAL | 0 refills | Status: DC | PRN

## 2020-12-27 ENCOUNTER — Other Ambulatory Visit: Payer: Self-pay

## 2020-12-27 ENCOUNTER — Encounter (HOSPITAL_BASED_OUTPATIENT_CLINIC_OR_DEPARTMENT_OTHER): Payer: Self-pay | Admitting: Plastic Surgery

## 2020-12-28 DIAGNOSIS — J029 Acute pharyngitis, unspecified: Secondary | ICD-10-CM | POA: Diagnosis not present

## 2020-12-30 ENCOUNTER — Other Ambulatory Visit: Payer: Self-pay | Admitting: Family Medicine

## 2020-12-30 ENCOUNTER — Encounter: Payer: Self-pay | Admitting: Plastic Surgery

## 2020-12-30 ENCOUNTER — Other Ambulatory Visit: Payer: Self-pay | Admitting: Physician Assistant

## 2020-12-30 ENCOUNTER — Encounter: Payer: Self-pay | Admitting: Family Medicine

## 2020-12-30 DIAGNOSIS — E785 Hyperlipidemia, unspecified: Secondary | ICD-10-CM

## 2020-12-30 MED ORDER — FLUCONAZOLE 150 MG PO TABS: 150.0000 mg | ORAL_TABLET | Freq: Every day | ORAL | 0 refills | Status: DC

## 2020-12-30 MED ORDER — ALPRAZOLAM 2 MG PO TABS: 2.0000 mg | ORAL_TABLET | Freq: Every evening | ORAL | 0 refills | Status: DC | PRN

## 2020-12-30 MED ORDER — LAMOTRIGINE 200 MG PO TABS: 200.0000 mg | ORAL_TABLET | Freq: Every day | ORAL | 1 refills | Status: DC

## 2020-12-30 NOTE — Progress Notes (Cosign Needed)
Spoke with patient.  She informs me that she often will have a vaginal yeast infection in the context of antibiotics.  We will provide her with a prescription for Diflucan to have in the event she develops symptoms concerning for vaginal candidiasis.

## 2021-01-02 ENCOUNTER — Other Ambulatory Visit: Payer: Self-pay

## 2021-01-02 MED ORDER — UBRELVY 100 MG PO TABS: 1.0000 | ORAL_TABLET | ORAL | 0 refills | Status: DC | PRN

## 2021-01-02 NOTE — Addendum Note (Signed)
Addended by: Leida Lauth on: 01/02/2021 09:34 AM   Modules accepted: Orders

## 2021-01-05 ENCOUNTER — Ambulatory Visit (HOSPITAL_BASED_OUTPATIENT_CLINIC_OR_DEPARTMENT_OTHER): Payer: Federal, State, Local not specified - PPO | Admitting: Anesthesiology

## 2021-01-05 ENCOUNTER — Encounter (HOSPITAL_BASED_OUTPATIENT_CLINIC_OR_DEPARTMENT_OTHER)
Admission: RE | Disposition: A | Discharge status: Home / Self Care | Payer: Self-pay | Source: Home / Self Care | Attending: Plastic Surgery

## 2021-01-05 ENCOUNTER — Other Ambulatory Visit: Payer: Self-pay

## 2021-01-05 ENCOUNTER — Encounter (HOSPITAL_BASED_OUTPATIENT_CLINIC_OR_DEPARTMENT_OTHER): Payer: Self-pay | Admitting: Plastic Surgery

## 2021-01-05 ENCOUNTER — Ambulatory Visit (HOSPITAL_BASED_OUTPATIENT_CLINIC_OR_DEPARTMENT_OTHER)
Admission: RE | Admit: 2021-01-05 | Discharge: 2021-01-05 | Disposition: A | Discharge status: Home / Self Care | Payer: Federal, State, Local not specified - PPO | Attending: Plastic Surgery | Admitting: Plastic Surgery

## 2021-01-05 DIAGNOSIS — M542 Cervicalgia: Secondary | ICD-10-CM | POA: Insufficient documentation

## 2021-01-05 DIAGNOSIS — Z888 Allergy status to other drugs, medicaments and biological substances status: Secondary | ICD-10-CM | POA: Insufficient documentation

## 2021-01-05 DIAGNOSIS — E559 Vitamin D deficiency, unspecified: Secondary | ICD-10-CM | POA: Diagnosis not present

## 2021-01-05 DIAGNOSIS — Z833 Family history of diabetes mellitus: Secondary | ICD-10-CM | POA: Diagnosis not present

## 2021-01-05 DIAGNOSIS — Z808 Family history of malignant neoplasm of other organs or systems: Secondary | ICD-10-CM | POA: Diagnosis not present

## 2021-01-05 DIAGNOSIS — Z7984 Long term (current) use of oral hypoglycemic drugs: Secondary | ICD-10-CM | POA: Diagnosis not present

## 2021-01-05 DIAGNOSIS — N62 Hypertrophy of breast: Secondary | ICD-10-CM | POA: Insufficient documentation

## 2021-01-05 DIAGNOSIS — M549 Dorsalgia, unspecified: Secondary | ICD-10-CM | POA: Diagnosis not present

## 2021-01-05 DIAGNOSIS — Z841 Family history of disorders of kidney and ureter: Secondary | ICD-10-CM | POA: Diagnosis not present

## 2021-01-05 DIAGNOSIS — M546 Pain in thoracic spine: Secondary | ICD-10-CM | POA: Diagnosis not present

## 2021-01-05 DIAGNOSIS — Z6830 Body mass index (BMI) 30.0-30.9, adult: Secondary | ICD-10-CM | POA: Insufficient documentation

## 2021-01-05 DIAGNOSIS — E669 Obesity, unspecified: Secondary | ICD-10-CM | POA: Diagnosis not present

## 2021-01-05 DIAGNOSIS — N6012 Diffuse cystic mastopathy of left breast: Secondary | ICD-10-CM | POA: Diagnosis not present

## 2021-01-05 DIAGNOSIS — Z8249 Family history of ischemic heart disease and other diseases of the circulatory system: Secondary | ICD-10-CM | POA: Insufficient documentation

## 2021-01-05 DIAGNOSIS — G8929 Other chronic pain: Secondary | ICD-10-CM | POA: Diagnosis not present

## 2021-01-05 DIAGNOSIS — Z87891 Personal history of nicotine dependence: Secondary | ICD-10-CM | POA: Insufficient documentation

## 2021-01-05 DIAGNOSIS — Z8371 Family history of colonic polyps: Secondary | ICD-10-CM | POA: Insufficient documentation

## 2021-01-05 DIAGNOSIS — N6011 Diffuse cystic mastopathy of right breast: Secondary | ICD-10-CM | POA: Diagnosis not present

## 2021-01-05 HISTORY — PX: BREAST REDUCTION SURGERY: SHX8

## 2021-01-05 LAB — POCT PREGNANCY, URINE: Preg Test, Ur: NEGATIVE

## 2021-01-05 SURGERY — BREAST REDUCTION WITH LIPOSUCTION
Anesthesia: General | Site: Breast | Laterality: Bilateral

## 2021-01-05 MED ORDER — HYDROMORPHONE HCL 1 MG/ML IJ SOLN
INTRAMUSCULAR | Status: AC
  Filled 2021-01-05: qty 0.5

## 2021-01-05 MED ORDER — SUCCINYLCHOLINE CHLORIDE 200 MG/10ML IV SOSY
PREFILLED_SYRINGE | INTRAVENOUS | Status: AC
  Filled 2021-01-05: qty 10

## 2021-01-05 MED ORDER — OXYCODONE HCL 5 MG/5ML PO SOLN: 5.0000 mg | Freq: Once | ORAL | Status: AC | PRN

## 2021-01-05 MED ORDER — AMISULPRIDE (ANTIEMETIC) 5 MG/2ML IV SOLN: 10.0000 mg | Freq: Once | INTRAVENOUS | Status: DC | PRN

## 2021-01-05 MED ORDER — PROPOFOL 10 MG/ML IV BOLUS
INTRAVENOUS | Status: DC | PRN
  Administered 2021-01-05: 150 mg via INTRAVENOUS

## 2021-01-05 MED ORDER — ACETAMINOPHEN 325 MG RE SUPP: 650.0000 mg | RECTAL | Status: DC | PRN

## 2021-01-05 MED ORDER — LIDOCAINE-EPINEPHRINE 1 %-1:100000 IJ SOLN
INTRAMUSCULAR | Status: DC | PRN
  Administered 2021-01-05: 25 mL

## 2021-01-05 MED ORDER — ONDANSETRON HCL 4 MG/2ML IJ SOLN
INTRAMUSCULAR | Status: AC
  Filled 2021-01-05: qty 2

## 2021-01-05 MED ORDER — SODIUM CHLORIDE 0.9 % IV SOLN: 250.0000 mL | INTRAVENOUS | Status: DC | PRN

## 2021-01-05 MED ORDER — OXYCODONE HCL 5 MG PO TABS
5.0000 mg | ORAL_TABLET | Freq: Once | ORAL | Status: AC | PRN
  Administered 2021-01-05: 5 mg via ORAL

## 2021-01-05 MED ORDER — SODIUM CHLORIDE 0.9% FLUSH: 3.0000 mL | Freq: Two times a day (BID) | INTRAVENOUS | Status: DC

## 2021-01-05 MED ORDER — EPHEDRINE SULFATE 50 MG/ML IJ SOLN
INTRAMUSCULAR | Status: DC | PRN
  Administered 2021-01-05: 10 mg via INTRAVENOUS

## 2021-01-05 MED ORDER — ROCURONIUM BROMIDE 10 MG/ML (PF) SYRINGE
PREFILLED_SYRINGE | INTRAVENOUS | Status: AC
  Filled 2021-01-05: qty 10

## 2021-01-05 MED ORDER — MIDAZOLAM HCL 5 MG/5ML IJ SOLN
INTRAMUSCULAR | Status: DC | PRN
  Administered 2021-01-05: 2 mg via INTRAVENOUS

## 2021-01-05 MED ORDER — PROMETHAZINE HCL 25 MG/ML IJ SOLN: 6.2500 mg | INTRAMUSCULAR | Status: DC | PRN

## 2021-01-05 MED ORDER — ACETAMINOPHEN 325 MG PO TABS
ORAL_TABLET | ORAL | Status: AC
  Filled 2021-01-05: qty 1

## 2021-01-05 MED ORDER — DEXMEDETOMIDINE HCL IN NACL 200 MCG/50ML IV SOLN
INTRAVENOUS | Status: AC
  Filled 2021-01-05: qty 50

## 2021-01-05 MED ORDER — HYDROMORPHONE HCL 1 MG/ML IJ SOLN
INTRAMUSCULAR | Status: DC | PRN
  Administered 2021-01-05 (×2): .5 mg via INTRAVENOUS

## 2021-01-05 MED ORDER — LIDOCAINE 2% (20 MG/ML) 5 ML SYRINGE
INTRAMUSCULAR | Status: AC
  Filled 2021-01-05: qty 5

## 2021-01-05 MED ORDER — CHLORHEXIDINE GLUCONATE CLOTH 2 % EX PADS: 6.0000 | MEDICATED_PAD | Freq: Once | CUTANEOUS | Status: DC

## 2021-01-05 MED ORDER — FENTANYL CITRATE (PF) 100 MCG/2ML IJ SOLN
INTRAMUSCULAR | Status: AC
  Filled 2021-01-05: qty 2

## 2021-01-05 MED ORDER — DEXMEDETOMIDINE (PRECEDEX) IN NS 20 MCG/5ML (4 MCG/ML) IV SYRINGE
PREFILLED_SYRINGE | INTRAVENOUS | Status: DC | PRN
  Administered 2021-01-05: 8 ug via INTRAVENOUS

## 2021-01-05 MED ORDER — SUGAMMADEX SODIUM 200 MG/2ML IV SOLN
INTRAVENOUS | Status: DC | PRN
  Administered 2021-01-05: 200 mg via INTRAVENOUS

## 2021-01-05 MED ORDER — LACTATED RINGERS IV SOLN: INTRAVENOUS | Status: DC

## 2021-01-05 MED ORDER — OXYCODONE HCL 5 MG PO TABS: 5.0000 mg | ORAL_TABLET | ORAL | Status: DC | PRN

## 2021-01-05 MED ORDER — ROCURONIUM BROMIDE 100 MG/10ML IV SOLN
INTRAVENOUS | Status: DC | PRN
  Administered 2021-01-05: 80 mg via INTRAVENOUS

## 2021-01-05 MED ORDER — ATROPINE SULFATE 0.4 MG/ML IV SOLN
INTRAVENOUS | Status: AC
  Filled 2021-01-05: qty 1

## 2021-01-05 MED ORDER — MORPHINE SULFATE (PF) 4 MG/ML IV SOLN
2.0000 mg | INTRAVENOUS | Status: DC | PRN
Start: 2021-01-05 — End: 2021-01-05

## 2021-01-05 MED ORDER — HYDROMORPHONE HCL 1 MG/ML IJ SOLN
0.2500 mg | INTRAMUSCULAR | Status: DC | PRN
  Administered 2021-01-05 (×3): 0.5 mg via INTRAVENOUS

## 2021-01-05 MED ORDER — HYDROMORPHONE HCL 1 MG/ML IJ SOLN
INTRAMUSCULAR | Status: AC
  Filled 2021-01-05: qty 1

## 2021-01-05 MED ORDER — SODIUM CHLORIDE 0.9% FLUSH: 3.0000 mL | INTRAVENOUS | Status: DC | PRN

## 2021-01-05 MED ORDER — EPHEDRINE 5 MG/ML INJ
INTRAVENOUS | Status: AC
  Filled 2021-01-05: qty 5

## 2021-01-05 MED ORDER — DEXAMETHASONE SODIUM PHOSPHATE 10 MG/ML IJ SOLN
INTRAMUSCULAR | Status: AC
  Filled 2021-01-05: qty 1

## 2021-01-05 MED ORDER — FENTANYL CITRATE (PF) 100 MCG/2ML IJ SOLN
INTRAMUSCULAR | Status: DC | PRN
  Administered 2021-01-05: 50 ug via INTRAVENOUS
  Administered 2021-01-05: 100 ug via INTRAVENOUS
  Administered 2021-01-05: 50 ug via INTRAVENOUS

## 2021-01-05 MED ORDER — LIDOCAINE HCL (CARDIAC) PF 100 MG/5ML IV SOSY
PREFILLED_SYRINGE | INTRAVENOUS | Status: DC | PRN
  Administered 2021-01-05: 80 mg via INTRAVENOUS

## 2021-01-05 MED ORDER — CEFAZOLIN SODIUM-DEXTROSE 2-4 GM/100ML-% IV SOLN
2.0000 g | INTRAVENOUS | Status: AC
  Administered 2021-01-05: 2 g via INTRAVENOUS

## 2021-01-05 MED ORDER — PHENYLEPHRINE 40 MCG/ML (10ML) SYRINGE FOR IV PUSH (FOR BLOOD PRESSURE SUPPORT)
PREFILLED_SYRINGE | INTRAVENOUS | Status: AC
  Filled 2021-01-05: qty 10

## 2021-01-05 MED ORDER — DEXAMETHASONE SODIUM PHOSPHATE 4 MG/ML IJ SOLN
INTRAMUSCULAR | Status: DC | PRN
  Administered 2021-01-05: 10 mg via INTRAVENOUS

## 2021-01-05 MED ORDER — ACETAMINOPHEN 325 MG PO TABS
650.0000 mg | ORAL_TABLET | ORAL | Status: DC | PRN
  Administered 2021-01-05: 650 mg via ORAL

## 2021-01-05 MED ORDER — 0.9 % SODIUM CHLORIDE (POUR BTL) OPTIME
TOPICAL | Status: DC | PRN
  Administered 2021-01-05: 1000 mL

## 2021-01-05 MED ORDER — OXYCODONE HCL 5 MG PO TABS
ORAL_TABLET | ORAL | Status: AC
  Filled 2021-01-05: qty 1

## 2021-01-05 MED ORDER — MEPERIDINE HCL 25 MG/ML IJ SOLN: 6.2500 mg | INTRAMUSCULAR | Status: DC | PRN

## 2021-01-05 MED ORDER — PROPOFOL 500 MG/50ML IV EMUL
INTRAVENOUS | Status: AC
  Filled 2021-01-05: qty 50

## 2021-01-05 MED ORDER — CEFAZOLIN SODIUM-DEXTROSE 2-4 GM/100ML-% IV SOLN
INTRAVENOUS | Status: AC
  Filled 2021-01-05: qty 100

## 2021-01-05 MED ORDER — LIDOCAINE HCL 1 % IJ SOLN
INTRAVENOUS | Status: DC | PRN
  Administered 2021-01-05: 300 mL

## 2021-01-05 SURGICAL SUPPLY — 65 items
ADH SKN CLS APL DERMABOND .7 (GAUZE/BANDAGES/DRESSINGS) ×2
BAG DECANTER FOR FLEXI CONT (MISCELLANEOUS) IMPLANT
BINDER BREAST LRG (GAUZE/BANDAGES/DRESSINGS) IMPLANT
BINDER BREAST MEDIUM (GAUZE/BANDAGES/DRESSINGS) IMPLANT
BINDER BREAST XLRG (GAUZE/BANDAGES/DRESSINGS) ×2 IMPLANT
BINDER BREAST XXLRG (GAUZE/BANDAGES/DRESSINGS) IMPLANT
BIOPATCH RED 1 DISK 7.0 (GAUZE/BANDAGES/DRESSINGS) IMPLANT
BLADE HEX COATED 2.75 (ELECTRODE) ×2 IMPLANT
BLADE KNIFE PERSONA 10 (BLADE) ×10 IMPLANT
BLADE SURG 15 STRL LF DISP TIS (BLADE) ×1 IMPLANT
BLADE SURG 15 STRL SS (BLADE) ×2
CANISTER SUCT 1200ML W/VALVE (MISCELLANEOUS) ×2 IMPLANT
COVER BACK TABLE 60X90IN (DRAPES) ×2 IMPLANT
COVER MAYO STAND STRL (DRAPES) ×2 IMPLANT
DECANTER SPIKE VIAL GLASS SM (MISCELLANEOUS) IMPLANT
DERMABOND ADVANCED (GAUZE/BANDAGES/DRESSINGS) ×2
DERMABOND ADVANCED .7 DNX12 (GAUZE/BANDAGES/DRESSINGS) ×2 IMPLANT
DRAIN CHANNEL 19F RND (DRAIN) IMPLANT
DRAPE LAPAROSCOPIC ABDOMINAL (DRAPES) ×2 IMPLANT
DRSG OPSITE POSTOP 4X6 (GAUZE/BANDAGES/DRESSINGS) ×4 IMPLANT
DRSG PAD ABDOMINAL 8X10 ST (GAUZE/BANDAGES/DRESSINGS) ×6 IMPLANT
ELECT BLADE 4.0 EZ CLEAN MEGAD (MISCELLANEOUS) ×4
ELECT REM PT RETURN 9FT ADLT (ELECTROSURGICAL) ×4
ELECTRODE BLDE 4.0 EZ CLN MEGD (MISCELLANEOUS) ×2 IMPLANT
ELECTRODE REM PT RTRN 9FT ADLT (ELECTROSURGICAL) ×2 IMPLANT
EVACUATOR SILICONE 100CC (DRAIN) IMPLANT
GLOVE SURG ENC MOIS LTX SZ6.5 (GLOVE) ×6 IMPLANT
GLOVE SURG ENC MOIS LTX SZ7.5 (GLOVE) ×2 IMPLANT
GLOVE SURG POLYISO LF SZ6.5 (GLOVE) ×6 IMPLANT
GLOVE SURG UNDER POLY LF SZ7 (GLOVE) ×8 IMPLANT
GOWN STRL REUS W/ TWL LRG LVL3 (GOWN DISPOSABLE) ×5 IMPLANT
GOWN STRL REUS W/TWL LRG LVL3 (GOWN DISPOSABLE) ×10
NEEDLE FILTER BLUNT 18X 1/2SAF (NEEDLE) ×1
NEEDLE FILTER BLUNT 18X1 1/2 (NEEDLE) ×1 IMPLANT
NEEDLE HYPO 25X1 1.5 SAFETY (NEEDLE) ×2 IMPLANT
NS IRRIG 1000ML POUR BTL (IV SOLUTION) ×2 IMPLANT
PACK BASIN DAY SURGERY FS (CUSTOM PROCEDURE TRAY) ×2 IMPLANT
PAD ALCOHOL SWAB (MISCELLANEOUS) ×4 IMPLANT
PAD FOAM SILICONE BACKED (GAUZE/BANDAGES/DRESSINGS) ×2 IMPLANT
PENCIL SMOKE EVACUATOR (MISCELLANEOUS) ×4 IMPLANT
PIN SAFETY STERILE (MISCELLANEOUS) IMPLANT
SLEEVE SCD COMPRESS KNEE MED (STOCKING) ×2 IMPLANT
SPONGE T-LAP 18X18 ~~LOC~~+RFID (SPONGE) ×10 IMPLANT
STAPLER VISISTAT 35W (STAPLE) ×2 IMPLANT
STRIP SUTURE WOUND CLOSURE 1/2 (MISCELLANEOUS) ×8 IMPLANT
SUT MNCRL AB 4-0 PS2 18 (SUTURE) ×18 IMPLANT
SUT MON AB 3-0 SH 27 (SUTURE) ×6
SUT MON AB 3-0 SH27 (SUTURE) ×3 IMPLANT
SUT MON AB 5-0 PS2 18 (SUTURE) ×4 IMPLANT
SUT PDS 3-0 CT2 (SUTURE) ×12
SUT PDS II 3-0 CT2 27 ABS (SUTURE) ×6 IMPLANT
SUT SILK 3 0 PS 1 (SUTURE) IMPLANT
SYR 20CC LL (SYRINGE) ×2 IMPLANT
SYR 3ML 18GX1 1/2 (SYRINGE) ×2 IMPLANT
SYR 50ML LL SCALE MARK (SYRINGE) IMPLANT
SYR BULB IRRIG 60ML STRL (SYRINGE) ×2 IMPLANT
SYR CONTROL 10ML LL (SYRINGE) ×2 IMPLANT
TAPE MEASURE VINYL STERILE (MISCELLANEOUS) IMPLANT
TOWEL GREEN STERILE FF (TOWEL DISPOSABLE) ×4 IMPLANT
TRAY DSU PREP LF (CUSTOM PROCEDURE TRAY) ×2 IMPLANT
TUBE CONNECTING 20X1/4 (TUBING) ×2 IMPLANT
TUBING INFILTRATION IT-10001 (TUBING) ×2 IMPLANT
TUBING SET GRADUATE ASPIR 12FT (MISCELLANEOUS) ×2 IMPLANT
UNDERPAD 30X36 HEAVY ABSORB (UNDERPADS AND DIAPERS) ×4 IMPLANT
YANKAUER SUCT BULB TIP NO VENT (SUCTIONS) ×2 IMPLANT

## 2021-01-05 NOTE — Discharge Instructions (Addendum)
INSTRUCTIONS FOR AFTER SURGERY   You will likely have some questions about what to expect following your operation.  The following information will help you and your family understand what to expect when you are discharged from the hospital.  Following these guidelines will help ensure a smooth recovery and reduce risks of complications.  Postoperative instructions include information on: diet, wound care, medications and physical activity.  AFTER SURGERY Expect to go home after the procedure.  In some cases, you may need to spend one night in the hospital for observation.  DIET This surgery does not require a specific diet.  However, I have to mention that the healthier you eat the better your body can start healing. It is important to increasing your protein intake.  This means limiting the foods with added sugar.  Focus on fruits and vegetables and some meat. It is very important to drink water after your surgery.  If your urine is bright yellow, then it is concentrated, and you need to drink more water.  As a general rule after surgery, you should have 8 ounces of water every hour while awake.  If you find you are persistently nauseated or unable to take in liquids let us know.  NO TOBACCO USE or EXPOSURE.  This will slow your healing process and increase the risk of a wound.  WOUND CARE You can shower the day after surgery.  Use fragrance free soap.  Dial, Dove, Ivory and Cetaphil are usually mild on the skin.  If you have steri-strips / tape directly attached to your skin leave them in place. It is OK to get these wet.  No baths, pools or hot tubs for two weeks. We close your incision to leave the smallest and best-looking scar. No ointment or creams on your incisions until given the go ahead.  Especially not Neosporin (Too many skin reactions with this one).  A few weeks after surgery you can use Mederma and start massaging the scar. We ask you to wear your binder or sports bra for the first 6  weeks around the clock, including while sleeping. This provides added comfort and helps reduce the fluid accumulation at the surgery site.  ACTIVITY No heavy lifting until cleared by the doctor.  It is OK to walk and climb stairs. In fact, moving your legs is very important to decrease your risk of a blood clot.  It will also help keep you from getting deconditioned.  Every 1 to 2 hours get up and walk for 5 minutes. This will help with a quicker recovery back to normal.  Let pain be your guide so you don't do too much.  NO, you cannot do the spring cleaning and don't plan on taking care of anyone else.  This is your time for TLC.   WORK Everyone returns to work at different times. As a rough guide, most people take at least 1 - 2 weeks off prior to returning to work. If you need documentation for your job, bring the forms to your postoperative follow up visit.  DRIVING Arrange for someone to bring you home from the hospital.  You may be able to drive a few days after surgery but not while taking any narcotics or valium.  BOWEL MOVEMENTS Constipation can occur after anesthesia and while taking pain medication.  It is important to stay ahead for your comfort.  We recommend taking Milk of Magnesia (2 tablespoons; twice a day) while taking the pain pills.  SEROMA This   is fluid your body tried to put in the surgical site.  This is normal but if it creates excessive pain and swelling let us know.  It usually decreases in a few weeks.  MEDICATIONS and PAIN CONTROL At your preoperative visit for you history and physical you were given the following medications: An antibiotic: Start this medication when you get home and take according to the instructions on the bottle. Zofran 4 mg:  This is to treat nausea and vomiting.  You can take this every 6 hours as needed and only if needed. Norco (hydrocodone/acetaminophen) 5/325 mg:  This is only to be used after you have taken the motrin or the tylenol. Every  8 hours as needed. Over the counter Medication to take: Ibuprofen (Motrin) 600 mg:  Take this every 6 hours.  If you have additional pain then take 500 mg of the tylenol.  Only take the Norco after you have tried these two. Miralax or stool softener of choice: Take this according to the bottle if you take the Norco.  WHEN TO CALL Call your surgeon's office if any of the following occur:  Fever 101 degrees F or greater  Excessive bleeding or fluid from the incision site.  Pain that increases over time without aid from the medications  Redness, warmth, or pus draining from incision sites  Persistent nausea or inability to take in liquids  Severe misshapen area that underwent the operation.   Post Anesthesia Home Care Instructions  Activity: Get plenty of rest for the remainder of the day. A responsible individual must stay with you for 24 hours following the procedure.  For the next 24 hours, DO NOT: -Drive a car -Operate machinery -Drink alcoholic beverages -Take any medication unless instructed by your physician -Make any legal decisions or sign important papers.  Meals: Start with liquid foods such as gelatin or soup. Progress to regular foods as tolerated. Avoid greasy, spicy, heavy foods. If nausea and/or vomiting occur, drink only clear liquids until the nausea and/or vomiting subsides. Call your physician if vomiting continues.  Special Instructions/Symptoms: Your throat may feel dry or sore from the anesthesia or the breathing tube placed in your throat during surgery. If this causes discomfort, gargle with warm salt water. The discomfort should disappear within 24 hours.  If you had a scopolamine patch placed behind your ear for the management of post- operative nausea and/or vomiting:  1. The medication in the patch is effective for 72 hours, after which it should be removed.  Wrap patch in a tissue and discard in the trash. Wash hands thoroughly with soap and water. 2. You  may remove the patch earlier than 72 hours if you experience unpleasant side effects which may include dry mouth, dizziness or visual disturbances. 3. Avoid touching the patch. Wash your hands with soap and water after contact with the patch.      

## 2021-01-05 NOTE — Anesthesia Postprocedure Evaluation (Signed)
Anesthesia Post Note  Patient: Jillian Reyes  Procedure(s) Performed: BREAST REDUCTION WITH LIPOSUCTION (Bilateral: Breast)     Patient location during evaluation: PACU Anesthesia Type: General Level of consciousness: awake and alert Pain management: pain level controlled Vital Signs Assessment: post-procedure vital signs reviewed and stable Respiratory status: spontaneous breathing, nonlabored ventilation and respiratory function stable Cardiovascular status: blood pressure returned to baseline and stable Postop Assessment: no apparent nausea or vomiting Anesthetic complications: no   No notable events documented.  Last Vitals:  Vitals:   01/05/21 1145 01/05/21 1216  BP: 105/67 109/66  Pulse: 96 84  Resp: 15 16  Temp:  36.6 C  SpO2: 96% 94%    Last Pain:  Vitals:   01/05/21 1216  TempSrc:   PainSc: 4                  Lowella Curb

## 2021-01-05 NOTE — Transfer of Care (Signed)
Immediate Anesthesia Transfer of Care Note  Patient: Jillian Reyes  Procedure(s) Performed: BREAST REDUCTION WITH LIPOSUCTION (Bilateral: Breast)  Patient Location: PACU  Anesthesia Type:General  Level of Consciousness: awake, alert , oriented, drowsy and patient cooperative  Airway & Oxygen Therapy: Patient Spontanous Breathing and Patient connected to face mask oxygen  Post-op Assessment: Report given to RN and Post -op Vital signs reviewed and stable  Post vital signs: Reviewed and stable  Last Vitals:  Vitals Value Taken Time  BP 107/72 01/05/21 1104  Temp    Pulse 99 01/05/21 1105  Resp 13 01/05/21 1105  SpO2 96 % 01/05/21 1105  Vitals shown include unvalidated device data.  Last Pain:  Vitals:   01/05/21 0727  TempSrc: Oral  PainSc: 0-No pain      Patients Stated Pain Goal: 3 (01/05/21 0727)  Complications: No notable events documented.

## 2021-01-05 NOTE — Anesthesia Procedure Notes (Signed)
Procedure Name: LMA Insertion Date/Time: 01/05/2021 9:07 AM Performed by: Ronnette Hila, CRNA Pre-anesthesia Checklist: Patient identified, Emergency Drugs available, Suction available and Patient being monitored Patient Re-evaluated:Patient Re-evaluated prior to induction Oxygen Delivery Method: Circle system utilized Preoxygenation: Pre-oxygenation with 100% oxygen Induction Type: IV induction Ventilation: Mask ventilation without difficulty LMA: LMA inserted LMA Size: 4.0 Number of attempts: 1 Airway Equipment and Method: Bite block Placement Confirmation: positive ETCO2 Tube secured with: Tape Dental Injury: Teeth and Oropharynx as per pre-operative assessment

## 2021-01-05 NOTE — Interval H&P Note (Signed)
History and Physical Interval Note:  01/05/2021 8:09 AM  Jillian Reyes  has presented today for surgery, with the diagnosis of mammary hypertrophy.  The various methods of treatment have been discussed with the patient and family. After consideration of risks, benefits and other options for treatment, the patient has consented to  Procedure(s) with comments: BREAST REDUCTION WITH LIPOSUCTION (Bilateral) - 3 hours as a surgical intervention.  The patient's history has been reviewed, patient examined, no change in status, stable for surgery.  I have reviewed the patient's chart and labs.  Questions were answered to the patient's satisfaction.     Alena Bills Glenn Christo

## 2021-01-05 NOTE — Anesthesia Preprocedure Evaluation (Signed)
Anesthesia Evaluation  Patient identified by MRN, date of birth, ID band Patient awake    Reviewed: Allergy & Precautions, NPO status , Patient's Chart, lab work & pertinent test results  Airway Mallampati: I  TM Distance: >3 FB Neck ROM: Full    Dental  (+) Dental Advisory Given, Teeth Intact   Pulmonary asthma , former smoker,    breath sounds clear to auscultation       Cardiovascular  Rhythm:Regular Rate:Normal     Neuro/Psych  Headaches, PSYCHIATRIC DISORDERS Anxiety Bipolar Disorder    GI/Hepatic GERD  Controlled,  Endo/Other    Renal/GU      Musculoskeletal   Abdominal (+) + obese,   Peds  Hematology   Anesthesia Other Findings   Reproductive/Obstetrics                             Lab Results  Component Value Date   WBC 6.5 07/06/2020   HGB 14.6 07/06/2020   HCT 42.9 07/06/2020   MCV 91.5 07/06/2020   PLT 255 07/06/2020    Anesthesia Physical  Anesthesia Plan  ASA: II  Anesthesia Plan: General   Post-op Pain Management:    Induction: Intravenous  PONV Risk Score and Plan: 3 and Dexamethasone, Ondansetron, Treatment may vary due to age or medical condition and Midazolam  Airway Management Planned: Oral ETT  Additional Equipment: None  Intra-op Plan:   Post-operative Plan: Extubation in OR  Informed Consent: I have reviewed the patients History and Physical, chart, labs and discussed the procedure including the risks, benefits and alternatives for the proposed anesthesia with the patient or authorized representative who has indicated his/her understanding and acceptance.     Dental advisory given  Plan Discussed with: CRNA and Anesthesiologist  Anesthesia Plan Comments:         Anesthesia Quick Evaluation

## 2021-01-05 NOTE — Op Note (Signed)
Breast Reduction Op note:    DATE OF PROCEDURE: 01/05/2021  LOCATION: [facility]  SURGEON: [facility] Ryin Schillo, DO  ASSISTANT: Evelena Leyden, PA, Dr. Janne Napoleon  PREOPERATIVE DIAGNOSIS 1. Macromastia 2. Neck Pain 3. Back Pain  POSTOPERATIVE DIAGNOSIS 1. Macromastia 2. Neck Pain 3. Back Pain  PROCEDURES 1. Bilateral breast reduction.  Right reduction 450 g, Left reduction 550 g  COMPLICATIONS: None.  DRAINS: none  INDICATIONS FOR PROCEDURE Jillian Reyes is a 30 y.o. year-old female born on 1990/11/14,with a history of symptomatic macromastia with concominant back pain, neck pain, shoulder grooving from her bra.   MRN: [id number]  CONSENT Informed consent was obtained directly from the patient. The risks, benefits and alternatives were fully discussed. Specific risks including but not limited to bleeding, infection, hematoma, seroma, scarring, pain, nipple necrosis, asymmetry, poor cosmetic results, and need for further surgery were discussed. The patient had ample opportunity to have her questions answered to her satisfaction.  DESCRIPTION OF PROCEDURE  Patient was brought into the operating room and placed in a supine position.  SCDs were placed and appropriate padding was performed.  Antibiotics were given. The patient underwent general anesthesia and the chest was prepped and draped in a sterile fashion.  A timeout was performed and all information was confirmed to be correct. Tumescent place in the lateral breast.  Liposuction done on each lateral breast.  Right side: Preoperative markings were confirmed.  Incision lines were injected with local with epinephrine.  After waiting for vasoconstriction, the marked lines were incised.  A Wise-pattern superomedial breast reduction was performed by de-epithelializing the pedicle, using bovie to create the superomedial pedicle, and removing breast tissue from the superior, lateral, and inferior  portions of the breast.  Care was taken to not undermine the breast pedicle. Hemostasis was achieved.  The nipple was gently rotated into position and the soft tissue closed with 4-0 Monocryl.   The pocket was irrigated and hemostasis confirmed.  The deep tissues were approximated with 3-0 Monocryl sutures and the skin was closed with deep dermal and subcuticular 4-0 Monocryl sutures.  The nipple and skin flaps had good capillary refill at the end of the procedure.    Left side: Preoperative markings were confirmed.  Incision lines were injected with local with epinephrine.  After waiting for vasoconstriction, the marked lines were incised.  A Wise-pattern superomedial breast reduction was performed by de-epithelializing the pedicle, using bovie to create the superomedial pedicle, and removing breast tissue from the superior, lateral, and inferior portions of the breast.  Care was taken to not undermine the breast pedicle. Hemostasis was achieved.  The nipple was gently rotated into position and the soft tissue was closed with 4-0 Monocryl.  The patient was sat upright and size and shape symmetry was confirmed.  The pocket was irrigated and hemostasis confirmed.  The deep tissues were approximated with 3-0 Monocryl sutures and the skin was closed with deep dermal and subcuticular 4-0 Monocryl sutures.  Dermabond was applied.  A breast binder and ABDs were placed.  The nipple and skin flaps had good capillary refill at the end of the procedure.  The patient tolerated the procedure well. The patient was allowed to wake from anesthesia and taken to the recovery room in satisfactory condition.  The advanced practice practitioner (APP) assisted throughout the case.  The APP was essential in retraction and counter traction when needed to make the case progress smoothly.  This retraction and assistance made it possible  to see the tissue plans for the procedure.  The assistance was needed for blood control, tissue  re-approximation and assisted with closure of the incision site.

## 2021-01-06 ENCOUNTER — Encounter (HOSPITAL_BASED_OUTPATIENT_CLINIC_OR_DEPARTMENT_OTHER): Payer: Self-pay | Admitting: Plastic Surgery

## 2021-01-06 LAB — SURGICAL PATHOLOGY

## 2021-01-06 NOTE — Addendum Note (Signed)
Addendum  created 01/06/21 0717 by Ronnette Hila, CRNA   Charge Capture section accepted

## 2021-01-11 ENCOUNTER — Telehealth: Payer: Self-pay

## 2021-01-11 ENCOUNTER — Telehealth: Payer: Self-pay | Admitting: Plastic Surgery

## 2021-01-11 NOTE — Telephone Encounter (Signed)
Please Follow Up  Patient called in regards to Breast Reduction done on 10/20. Said she has not been taking her pain medicine due to nausea so she was wondering if she could ice her breast.  If someone could follow up with her, she would love to know if ice is not okay is there anything else that can be recommended.  Thank You

## 2021-01-11 NOTE — Telephone Encounter (Signed)
Please advise. Thanks.  

## 2021-01-11 NOTE — Telephone Encounter (Signed)
New message    Call [employer] at [phone] to initiate prior authorization for Ubrelvy  100 mg tablets.  Spoke with [employer] Snuffer the medication Bernita Raisin is not in the patient formulary plan,   Nurtec is the only alternative that requires Prior Authorization.  Please Advise

## 2021-01-12 MED ORDER — NURTEC 75 MG PO TBDP: 75.0000 mg | ORAL_TABLET | ORAL | 0 refills | Status: DC | PRN

## 2021-01-12 NOTE — Telephone Encounter (Signed)
Pt advised of Dr,Jaffe note, She hasn't made a follow up appointment yet.  As it has been over a year since I saw her, I cannot start a different medication. If she has a follow up appointment scheduled that she will keep, then we can try Nurtec (1 tablet daily as needed.  Quantity 16, refill 0) and send to [facility] .   Front desk pleae let me know pt has been schedule so we can send script

## 2021-01-12 NOTE — Telephone Encounter (Signed)
Yes, that's fine.  I believe that we called her in Zofran but can certainly change up her antiemetic if that has been inadequate for controlling her nausea

## 2021-01-13 ENCOUNTER — Other Ambulatory Visit: Payer: Self-pay

## 2021-01-13 ENCOUNTER — Encounter: Payer: Self-pay | Admitting: Physician Assistant

## 2021-01-13 ENCOUNTER — Ambulatory Visit (INDEPENDENT_AMBULATORY_CARE_PROVIDER_SITE_OTHER): Payer: Federal, State, Local not specified - PPO | Admitting: Physician Assistant

## 2021-01-13 DIAGNOSIS — Z9889 Other specified postprocedural states: Secondary | ICD-10-CM

## 2021-01-13 NOTE — Telephone Encounter (Addendum)
Pt was seen by Gerre Pebbles, PA in office today and all concerns were addressed.

## 2021-01-13 NOTE — Progress Notes (Signed)
Patient is a 30 year old female with PMH of eczema on Dupixent injections and macromastia s/p bilateral breast reduction with liposuction performed 01/05/2021 by Dr. Ulice Bold who presents for postoperative follow-up.  She had 450 g removed from the right side and 550 g removed from the left side.  No drains were placed.  Today, patient is doing well.  She states that she took Norco for the first 2 days after surgery, but did not like the associated nausea and constipation so she has been managing with Tylenol and ibuprofen alone.  She states that it is difficult for her to sleep on her back, but understands the importance of doing so for an additional few weeks.   Her discomfort is predominantly in axillary regions where she had liposuction performed.  She also endorses discomfort in inframammary fold.  She has been placing ABD pads under her breasts before placing compression bra or breast binder which has helped considerably.  She endorses bruising, but denies any significant swelling, redness, streaking, worsening pain symptoms, fevers or chills, drainage, or other symptoms.  Physical exam largely reassuring.  There is ecchymoses over breast bilaterally as well as in her axillary regions.  NAC's appear viable.  No obvious areas of subcutaneous fluid collection.  Breasts appear symmetric, good shape and contour.  No obvious wounds.  Steri-Strips and honeycomb dressing appear firmly intact.  Continue with activity modification and compression.  She will call the clinic should she develop any questions or concerns, otherwise can plan for follow-up at her scheduled appointment in 2 weeks.

## 2021-01-23 ENCOUNTER — Telehealth: Payer: Self-pay

## 2021-01-23 NOTE — Telephone Encounter (Signed)
New message    [facility] questions answered for Nurtec ODT.

## 2021-01-24 NOTE — Telephone Encounter (Signed)
F/u   Received fax from [facility]   [facility] Clinical Call Center  The prior authorization request has been approved for Nurtec ODT 75 mg  The authorization is valid for 56 every 90 days from 12/24/20 through  01/23/2021 .   A letter of explanation will also be mailed to the patient.

## 2021-01-25 NOTE — Telephone Encounter (Signed)
January 24, 2021 Me   GD   12:43 PM Note   F/u    Received fax from [facility]  [facility] Clinical Call Center   The prior authorization request has been approved for Nurtec ODT 75 mg   The authorization is valid for 56 every 90 days from 12/24/20 through  01/23/2021 .    A letter of explanation will also be mailed to the patient.

## 2021-01-26 ENCOUNTER — Ambulatory Visit: Payer: Federal, State, Local not specified - PPO | Admitting: Neurology

## 2021-01-26 ENCOUNTER — Encounter: Payer: Self-pay | Admitting: Neurology

## 2021-01-26 ENCOUNTER — Other Ambulatory Visit: Payer: Self-pay

## 2021-01-26 VITALS — BP 103/67 | HR 93 | Ht 65.0 in | Wt 175.4 lb

## 2021-01-26 DIAGNOSIS — G43109 Migraine with aura, not intractable, without status migrainosus: Secondary | ICD-10-CM

## 2021-01-26 DIAGNOSIS — M542 Cervicalgia: Secondary | ICD-10-CM | POA: Diagnosis not present

## 2021-01-26 MED ORDER — UBRELVY 100 MG PO TABS: 1.0000 | ORAL_TABLET | ORAL | 5 refills | Status: DC | PRN

## 2021-01-26 MED ORDER — TIZANIDINE HCL 4 MG PO TABS: ORAL_TABLET | ORAL | 5 refills | Status: DC

## 2021-01-26 NOTE — Progress Notes (Signed)
NEUROLOGY FOLLOW UP OFFICE NOTE  VESTER TITSWORTH [id number]  Assessment/Plan:    Migraine with aura, without status migrainosus, not intractable - worse since recent breast reduction surgery and neck pain. Cervicalgia, myofascial  Migraine prevention:  Qulipta 60mg  daily Migraine rescue:  Resend Ubrelvy 100mg  - She has failed two triptans and Nurtec.  has been effective. Tizanidine 2 to 4mg  as needed for neck pain Limit use of pain relievers to no more than 2 days out of week to prevent risk of rebound or medication-overuse headache. Keep headache diary Follow up 6 months.   Subjective:  Zaiya A. Balazs is a 30 year old right-handed female who follows up for migraine.  UPDATE Last seen in October 2021.  Started on [place], which has helped.  Nicolasa Ducking has helped but she was no longer able to receive it as it is not formulary with her insurance, so she was started on Nurtec.  Not only does Nurtec not work, it makes the headache worse.   Intensity:  severe Duration:  several hours with Nurtec Frequency:  1-2 days a month She has a breast reduction a few weeks ago and she has had a migraine once a week followed by a postdrome headache for 2 days.  Reports increased neck pain.  She takes ibuprofen and Tylenol daily to treat post-op pain.    Current NSAIDS/analgesics:  Excedrin Migraine. Diclofenac 75mg  BID (for knee pain) Current triptans:  none Current ergotamine:  none Current anti-emetic:  Zofran ODT 4mg  Current muscle relaxants:  none Current Antihypertensive medications:  none Current Antidepressant medications:  none Current Anticonvulsant medications:  none Current anti-CGRP:  Qulipta 60mg  daily, Nurtec (rescue) Current Vitamins/Herbal/Supplements:  MVI Current Antihistamines/Decongestants:  Flonase Other therapy:  none Hormone/birth control:  none  Caffeine:  1 cup coffee daily.  Soda once in a while Diet:  3 bottles of water daily.   Skips meals Exercise:  No Depression:  yes; Anxiety:  Yes.  Sister recently passed away from a drug overdose.  History of Bipolar disorder. Other pain:  no Sleep hygiene:  Poor.  Trouble staying asleep.  HISTORY:  Beginning at age 35 years old, she had headaches.  In 2012 headaches became more severe.  They start in the back of the neck and base of her skull and radiate to front of head bilaterally.  It is throbbing and associated with nausea, photophobia, phonophobia, rarely vomiting.  Sometimes sees stars or dots.  They usually last 1 to  2 days.  They occur once a month to once every other month.  However, she a dull headache 20 days a month.  Sometimes dull headache may be in temples as well.  Triggers include perfumes and cigarette smell.  Rest helps.  Maxalt ineffective.  Takes Excedrin Migraine daily.   MRI of cervical spine from 1/3/2017was unremarkable.     Past NSAIDS/analgesics:  Advil, Tylenol, naproxen, hydrocodone Past abortive triptans:  sumatriptan tab, Maxalt Past abortive ergotamine:  none Past muscle relaxants:  Flexeril, tizanidine Past anti-emetic:  promethazine Past antihypertensive medications:  propranolol Past antidepressant medications:  Duloxetine (side effects) Past anticonvulsant medications:  Topiramate, Depakote Past anti-CGRP:  Aimovig 140mg  (made her sick), Ubrelvy (helpful but not formulary) Past vitamins/Herbal/Supplements:  none Past antihistamines/decongestants:  none Other past therapies:  Dry needling (helped)    Family history of headache:  Mom (migraines), father (migraines, brain tumor) She has PCOS.  She has irregular periods.    PAST MEDICAL HISTORY: Past Medical History:  Diagnosis Date   Acne    Asthma due to environmental allergies    per pt moderate, prn inhaler,  followed by pcp   Bipolar 1 disorder (HCC)    Chronic back pain    Chronic pelvic pain in female    Dyslipidemia 02/18/2013   Eczema    GAD (generalized anxiety  disorder)    GERD (gastroesophageal reflux disease)    History of abnormal cervical Pap smear    History of chlamydia 2015   History of gestational diabetes    Irregular periods/menstrual cycles    Migraines    PCOS (polycystic ovarian syndrome)    Vitamin D deficiency    Wears glasses     MEDICATIONS: Current Outpatient Medications on File Prior to Visit  Medication Sig Dispense Refill   albuterol (VENTOLIN HFA) 108 (90 Base) MCG/ACT inhaler Inhale 2 puffs into the lungs every 6 (six) hours as needed for wheezing or shortness of breath. 18 g 2   alprazolam (XANAX) 2 MG tablet Take 1 tablet (2 mg total) by mouth at bedtime as needed. for sleep. 30 tablet 0   clobetasol cream (TEMOVATE) 0.05 % APPLY TOPICALLY DAILY AS NEEDED. ECZEMA 60 g 3   DUPIXENT 300 MG/2ML SOPN      EUCRISA 2 % OINT as needed.     fluconazole (DIFLUCAN) 150 MG tablet Take 1 tablet (150 mg total) by mouth daily. Take one pill and then take second pill 72 hours if still symptomatic. 2 tablet 0   fluticasone (FLONASE) 50 MCG/ACT nasal spray Place 2 sprays into both nostrils 2 (two) times daily. 18.2 mL 5   lamoTRIgine (LAMICTAL) 200 MG tablet Take 1 tablet (200 mg total) by mouth at bedtime. 90 tablet 1   metroNIDAZOLE (METROCREAM) 0.75 % cream Apply topically 2 (two) times daily.     Multiple Vitamin (MULTIVITAMIN ADULT PO) Take by mouth daily.     ondansetron (ZOFRAN ODT) 4 MG disintegrating tablet Take 1 tablet (4 mg total) by mouth every 8 (eight) hours as needed for nausea or vomiting. (Patient not taking: Reported on 01/13/2021) 20 tablet 0   Rimegepant Sulfate (NURTEC) 75 MG TBDP Take 75 mg by mouth as needed (take 1 tab at the earlist onset of a Migraine. Max 1 tab in 24 hours). 16 tablet 0   tacrolimus (PROTOPIC) 0.1 % ointment Apply topically 2 (two) times daily.     Ubrogepant (UBRELVY) 100 MG TABS Take 1 tablet by mouth as needed (May repeat 1 tablet in 2 hours.  Maximum 2 tablets in 24 hours.). 16 tablet 0    [DISCONTINUED] naproxen (NAPROSYN) 375 MG tablet Take 1 tablet (375 mg total) by mouth 2 (two) times daily with a meal. 360 tablet 0   No current facility-administered medications on file prior to visit.    ALLERGIES: Allergies  Allergen Reactions   Cymbalta [Duloxetine Hcl] Nausea And Vomiting    FAMILY HISTORY: Family History  Problem Relation Age of Onset   Heart disease Other    Diabetes Father 64       type 2   Thyroid disease Father    Brain cancer Father    Cancer Father        tumor of the brain cancer   Migraines Mother    Kidney disease Mother    Colon polyps Mother    Hyperlipidemia Other        great grandparents      Objective:  Blood pressure 103/67, pulse 93, height  5\' 5"  (1.651 m), weight 175 lb 6.4 oz (79.6 kg), SpO2 97 %. General: No acute distress.  Patient appears well-groomed.   Head:  Normocephalic/atraumatic Eyes:  Fundi examined but not visualized Neck: supple, no paraspinal tenderness, full range of motion Heart:  Regular rate and rhythm Lungs:  Clear to auscultation bilaterally Back: No paraspinal tenderness Neurological Exam: alert and oriented to person, place, and time.  Speech fluent and not dysarthric, language intact.  CN II-XII intact. Bulk and tone normal, muscle strength 5/5 throughout.  Sensation to light touch intact.  Deep tendon reflexes 2+ throughout,.  Finger to nose testing intact.  Gait normal, Romberg negative.   , DO  CC: Shon Millet, MD

## 2021-01-26 NOTE — Patient Instructions (Signed)
Jillian Reyes sent to [facility] Qulipta 60mg  daily Tizanidine 1/2 to 1 tablet three times daily as needed for neck pain Follow up 6 months.

## 2021-01-30 NOTE — Progress Notes (Signed)
Patient is a 30 year old female with PMH of eczema on Dupixent injections and macromastia s/p bilateral breast reduction with liposuction performed 01/05/2021 by Dr. Ulice Bold who presents for postoperative follow-up.  She was last seen here in the office on 01/05/2021.  At that time, complained of postoperative discomfort, predominantly in axillary regions where liposuction had been performed.  Physical exam was reassuring.  Steri-Strips and honeycomb dressing remained firmly intact.  Plan was for continued activity modification and compression bras.  Today, patient is doing really well.  She removed the vast majority of her Steri-Strips independently as they were falling off.  She states that the redness under the breast that she had called in about 16 days ago resolved without issue or intervention.  She still endorses postoperative soreness, particularly in the axillary regions bilaterally.  Denies any fevers, chills, recent infection, redness, streaking, drainage, or other symptoms.  She states that the sometimes the Dermabond/scabbing catches on her clothing.  Physical exam is entirely reassuring.  Breast with good shape and symmetry.  Soft, nontender to palpation.  Mild tenderness appreciated over bilateral axillary regions.  Old periareolar ecchymoses, no recent bruising.  NAC's are viable.  Incisions intact.  No drainage or areas of dehiscence.  No erythema or evidence concerning for infection.    Recommending continued compression bra for another couple of weeks before transitioning to normal bras.  She understands not to wear an underwire bra moving forward.  She can apply Vaseline over incisions around NAC's and vertical limb to help dissolve the Dermabond.  Recommending that she increase activity as tolerated.  Reminded no lifting/pushing/pulling greater than 15 pounds for another few weeks.  I also told the patient that she is safe to restart her medical regimen for eczema.  Given how well  she looks today, no specific follow-up required.  She can certainly call to schedule appointment should she have any questions or concerns.  Discussed scar cream in 2 to 3 weeks after incisions have entirely healed and Dermabond has dissolved.  She may call the clinic to pick up [facility].  Picture(s) obtained of the patient and placed in the chart were with the patient's or guardian's permission.

## 2021-01-31 ENCOUNTER — Other Ambulatory Visit: Payer: Self-pay

## 2021-01-31 ENCOUNTER — Ambulatory Visit (INDEPENDENT_AMBULATORY_CARE_PROVIDER_SITE_OTHER): Payer: Federal, State, Local not specified - PPO | Admitting: Physician Assistant

## 2021-01-31 DIAGNOSIS — Z9889 Other specified postprocedural states: Secondary | ICD-10-CM

## 2021-02-13 ENCOUNTER — Other Ambulatory Visit: Payer: Self-pay

## 2021-02-13 ENCOUNTER — Ambulatory Visit (INDEPENDENT_AMBULATORY_CARE_PROVIDER_SITE_OTHER): Payer: Federal, State, Local not specified - PPO | Admitting: Physician Assistant

## 2021-02-13 DIAGNOSIS — Z9889 Other specified postprocedural states: Secondary | ICD-10-CM

## 2021-02-13 MED ORDER — CLINDAMYCIN HCL 150 MG PO CAPS: 450.0000 mg | ORAL_CAPSULE | Freq: Three times a day (TID) | ORAL | 0 refills | Status: DC

## 2021-02-13 MED ORDER — CLINDAMYCIN HCL 150 MG PO CAPS: 450.0000 mg | ORAL_CAPSULE | Freq: Three times a day (TID) | ORAL | 0 refills | Status: AC

## 2021-02-13 NOTE — Progress Notes (Signed)
Patient is a 30 year old female with PMH of eczema on Dupixent injections and macromastia s/p bilateral breast reduction with liposuction performed 01/05/2021 by Dr. Ulice Bold who presents for postoperative follow-up.  She was last seen here in the office on 04/02/2020.  At that time, exam was reassuring and she was without any complaints.  Discussed scar creams.  No specific follow-up was warranted given how well she looked.  She then sent a message stating that she had noticed an area of redness over her medial right breast that was tender and seemingly getting worse by the day.  Asked that she come in for evaluation.  Patient reports that she started her Dupixent injections for her eczema about 10 days ago and then 3 days ago developed medial aspect breast redness that has been getting progressively more tender.  She tried putting a heat pack on it last evening which did not help.  She denies fevers, chills, streaking, or other systemic symptoms.  Physical exam shows breast with good shape and symmetry.  She does have a small area of mild redness approximately 3 x 3 cm that is mildly firm over medial aspect right breast.  No fluctuance.  No crepitus.  Doubt abscess.  No fluid shift in no obvious seroma appreciated on exam.  No areas of wound dehiscence.  NACs are viable.  Exam is otherwise entirely reassuring.  Will cover with antibiotics.  Prescribing clindamycin x7 days.  Patient to return to clinic in 10 to 14 days unless she is completely resolved in which case she can cancel.

## 2021-02-13 NOTE — Addendum Note (Signed)
Addended by: Evelena Leyden on: 02/13/2021 01:31 PM   Modules accepted: Orders

## 2021-02-20 NOTE — Progress Notes (Signed)
Patient is a 30 year old female with PMH of eczema on Dupixent injections and macromastia s/p bilateral breast reduction with liposuction performed 01/05/2021 by Dr. Ulice Bold who presents for postoperative follow-up.   She was seen most recently on 02/05/2021.  At that time, she had good shape and symmetry.  There was a small area of mild redness over medial aspect of right breast.  It was firm, tender.  No crepitus or fluctuance appreciated.  No seromas.  She had just recently restarted her Dupixent injections for her eczema.  Denies any systemic symptoms.  Prescribed a 7-day course of clindamycin.   Today, patient states that her area of redness over medial aspect right breast has resolved after clindamycin.  She states that there is still a small area of firmness appreciated, but considerably improved from last time she was here in the office.  Nontender.  Denies any areas of swelling.  She also feels as though her left breast is a bit softer and more ptotic than the right.  Denies recent fevers, chills, or other systemic symptoms.  Physical exam is entirely reassuring.  Pictures obtained and placed in chart.  No areas of wound dehiscence noted.  No obvious seromas.  There is a very small, 1 x 1 cm area of slight firmness over medial aspect right breast, not under an incision.  Feels much improved compared to last visit.  Suspect small area of fat necrosis and recommending that she massage the area gently.  There is no areas of redness or other concern for infection at this time.  Her left breast is mildly ptotic relative to the right, suspect that the right breast still has not yet fully settled.  Suspect that the symmetry will be improved as she is not even 2 months postop.  Overall she looks great and is satisfied with her surgical outcome.  No specific follow-up required.  She can call the office in a few months if she is at all dissatisfied with any asymmetries to discuss possible fat grafting for  contour.  However, she is overall pleased.  Picture(s) obtained of the patient and placed in the chart were with the patient's or guardian's permission.

## 2021-02-24 ENCOUNTER — Ambulatory Visit: Payer: Federal, State, Local not specified - PPO | Admitting: Physician Assistant

## 2021-02-24 ENCOUNTER — Other Ambulatory Visit: Payer: Self-pay

## 2021-02-24 DIAGNOSIS — Z9889 Other specified postprocedural states: Secondary | ICD-10-CM

## 2021-03-26 ENCOUNTER — Encounter (HOSPITAL_BASED_OUTPATIENT_CLINIC_OR_DEPARTMENT_OTHER): Payer: Self-pay | Admitting: Obstetrics & Gynecology

## 2021-03-26 ENCOUNTER — Other Ambulatory Visit (HOSPITAL_BASED_OUTPATIENT_CLINIC_OR_DEPARTMENT_OTHER): Payer: Self-pay | Admitting: Obstetrics & Gynecology

## 2021-03-26 MED ORDER — METRONIDAZOLE 500 MG PO TABS: 500.0000 mg | ORAL_TABLET | Freq: Two times a day (BID) | ORAL | 0 refills | Status: DC

## 2021-04-11 ENCOUNTER — Ambulatory Visit: Payer: Federal, State, Local not specified - PPO | Admitting: Family

## 2021-04-11 VITALS — BP 100/72 | HR 79 | Temp 98.3°F | Ht 64.0 in | Wt 170.0 lb

## 2021-04-11 DIAGNOSIS — G43809 Other migraine, not intractable, without status migrainosus: Secondary | ICD-10-CM

## 2021-04-11 MED ORDER — KETOROLAC TROMETHAMINE 60 MG/2ML IM SOLN
60.0000 mg | Freq: Once | INTRAMUSCULAR | Status: AC
  Administered 2021-04-11: 14:00:00 60 mg via INTRAMUSCULAR

## 2021-04-11 MED ORDER — ONDANSETRON 8 MG PO TBDP: 8.0000 mg | ORAL_TABLET | Freq: Three times a day (TID) | ORAL | 0 refills | Status: DC | PRN

## 2021-04-11 NOTE — Progress Notes (Signed)
Jillian MansDebra A Reyes is a 31 y.o. female with the following history as recorded in EpicCare:  Patient Active Problem List   Diagnosis Date Noted   Symptomatic mammary hypertrophy 08/09/2020   PCOS (polycystic ovarian syndrome) 10/16/2019   Grief reaction 05/27/2019   Painful coitus, female 12/05/2018   Tachycardia 07/14/2018   Hypocalcemia 12/22/2016   Right hand pain 12/22/2016   Bilateral calf pain 12/21/2016   Right knee pain 12/21/2016   Closed nondisplaced fracture of distal phalanx of right ring finger 12/07/2016   Hemorrhoids 12/01/2016   History of hidradenitis suppurativa 10/14/2016   Abdominal pain 10/12/2016   Left ankle sprain 04/10/2016   Folliculitis 04/10/2016   Bilateral neck pain 03/16/2016   Preventative health care 04/10/2015   Vitamin D deficiency 04/10/2015   Dysuria 01/10/2014   Allergic rhinitis 11/30/2013   Migraine without aura and without status migrainosus, not intractable 11/17/2013   Encounter for preconception consultation 07/27/2013   Insomnia 07/02/2013   Tendinopathy of rotator cuff 03/08/2013   Dyslipidemia 02/18/2013   Contraceptive management 01/06/2012   Eczema 01/06/2012   Acne 01/06/2012   Hx of gestational diabetes mellitus, not currently pregnant    Mid back pain    Allergic state    Abnormal cells of cervix    Anxiety as acute reaction to exceptional stress 05/01/2011   Bipolar 1 disorder (HCC) 07/05/2010    Current Outpatient Medications  Medication Sig Dispense Refill   albuterol (VENTOLIN HFA) 108 (90 Base) MCG/ACT inhaler Inhale 2 puffs into the lungs every 6 (six) hours as needed for wheezing or shortness of breath. 18 g 2   alprazolam (XANAX) 2 MG tablet Take 1 tablet (2 mg total) by mouth at bedtime as needed. for sleep. 30 tablet 0   clobetasol cream (TEMOVATE) 0.05 % APPLY TOPICALLY DAILY AS NEEDED. ECZEMA 60 g 3   DUPIXENT 300 MG/2ML SOPN      EUCRISA 2 % OINT as needed.     fluticasone (FLONASE) 50 MCG/ACT nasal spray  Place 2 sprays into both nostrils 2 (two) times daily. 18.2 mL 5   lamoTRIgine (LAMICTAL) 200 MG tablet Take 1 tablet (200 mg total) by mouth at bedtime. 90 tablet 1   metroNIDAZOLE (FLAGYL) 500 MG tablet Take 1 tablet (500 mg total) by mouth 2 (two) times daily. (Patient not taking: Reported on 04/11/2021) 14 tablet 0   metroNIDAZOLE (METROCREAM) 0.75 % cream Apply topically 2 (two) times daily.     Multiple Vitamin (MULTIVITAMIN ADULT PO) Take by mouth daily.     ondansetron (ZOFRAN-ODT) 8 MG disintegrating tablet Take 1 tablet (8 mg total) by mouth every 8 (eight) hours as needed for nausea or vomiting. 20 tablet 0   tiZANidine (ZANAFLEX) 4 MG tablet Take 1/2 tablet to 1 tablet three times daily as needed.  Caution for drowsiness. 30 tablet 5   Ubrogepant (UBRELVY) 100 MG TABS Take 1 tablet by mouth as needed (May repeat 1 tablet in 2 hours.  Maximum 2 tablets in 24 hours.). 16 tablet 5   tacrolimus (PROTOPIC) 0.1 % ointment Apply topically 2 (two) times daily.     Current Facility-Administered Medications  Medication Dose Route Frequency Provider Last Rate Last Admin   ketorolac (TORADOL) injection 60 mg  60 mg Intramuscular Once Jillian Reyes, Jillian Levi Woodruff, FNP        Allergies: Cymbalta [duloxetine hcl]  Past Medical History:  Diagnosis Date   Acne    Asthma due to environmental allergies    per pt moderate,  prn inhaler,  followed by pcp   Bipolar 1 disorder (HCC)    Chronic back pain    Chronic pelvic pain in female    Dyslipidemia 02/18/2013   Eczema    GAD (generalized anxiety disorder)    GERD (gastroesophageal reflux disease)    History of abnormal cervical Pap smear    History of chlamydia 2015   History of gestational diabetes    Irregular periods/menstrual cycles    Migraines    PCOS (polycystic ovarian syndrome)    Vitamin D deficiency    Wears glasses     Past Surgical History:  Procedure Laterality Date   BREAST REDUCTION SURGERY Bilateral 01/05/2021   Procedure:  BREAST REDUCTION WITH LIPOSUCTION;  Surgeon: Jillian Form, DO;  Location: [facility];  Service: Plastics;  Laterality: Bilateral;   HYSTEROSCOPY WITH NOVASURE N/A 07/06/2020   Procedure: HYSTEROSCOPY WITH NOVASURE ABLATION, DILITATION AND CURETTAGE;  Surgeon: Jillian Bears, MD;  Location: [facility];  Service: Gynecology;  Laterality: N/A;   INCISION AND DRAINAGE Right 01/12/2019   Procedure: INCISION AND DRAINAGE OF LESION;  Surgeon: Jillian Bears, MD;  Location[facility] ;  Service: Gynecology;  Laterality: Right;   KNEE ARTHROSCOPY Right 04-16-2019  @[facility]    LAPAROSCOPY N/A 01/12/2019   Procedure: LAPAROSCOPY DIAGNOSTIC; LYSIS OF ADHESIONS;  Surgeon: 01/14/2019, MD;  Location: [facility];  Service: Gynecology;  Laterality: N/A;  possible treatement of endometriosis   WISDOM TOOTH EXTRACTION  2016    Family History  Problem Relation Age of Onset   Heart disease Other    Diabetes Father 48       type 2   Thyroid disease Father    Brain cancer Father    Cancer Father        tumor of the brain cancer   Migraines Mother    Kidney disease Mother    Colon polyps Mother    Hyperlipidemia Other        great grandparents    Social History   Tobacco Use   Smoking status: Former    Years: 13.00    Types: Cigarettes    Quit date: 06/29/2018    Years since quitting: 2.7   Smokeless tobacco: Never  Substance Use Topics   Alcohol use: Not Currently    Subjective:  History of migraine headaches; typically responds well to [place]; has not had a headache "this bad" in over a year; used Ubrelvy this morning with no relief; headache has been present for 3 days; feeling nauseated as well; has responded well to Toradol in the past and comfortable to get injection today; no loss of vision/ numbness/ tingling of extremities;   LMP- uterine ablation   Objective:  Vitals:   04/11/21 1400  BP: 100/72  Pulse: 79  Temp:  98.3 F (36.8 C)  TempSrc: Oral  SpO2: 97%  Weight: 170 lb (77.1 kg)  Height: 5\' 4"  (1.626 m)    General: Well developed, well nourished, in mild distress- prefers room to be darkened;   Skin : Warm and dry.  Head: Normocephalic and atraumatic  Eyes: Sclera and conjunctiva clear; pupils round and reactive to light; extraocular movements intact  Lungs: Respirations unlabored; clear to auscultation bilaterally without wheeze, rales, rhonchi  CVS exam: normal rate and regular rhythm.  Neurologic: Alert and oriented; speech intact; face symmetrical; moves all extremities well; CNII-XII intact without focal deficit   Assessment:  1. Other migraine without  status migrainosus, not intractable     Plan:  Toradol IM 60 mg daily; refill updated for Zofran for nausea; follow up with her neurologist if symptoms persist.  This visit occurred during the SARS-CoV-2 public health emergency.  Safety protocols were in place, including screening questions prior to the visit, additional usage of staff PPE, and extensive cleaning of exam room while observing appropriate contact time as indicated for disinfecting solutions.    No follow-ups on file.  No orders of the defined types were placed in this encounter.   Requested Prescriptions   Signed Prescriptions Disp Refills   ondansetron (ZOFRAN-ODT) 8 MG disintegrating tablet 20 tablet 0    Sig: Take 1 tablet (8 mg total) by mouth every 8 (eight) hours as needed for nausea or vomiting.

## 2021-04-18 DIAGNOSIS — Z79899 Other long term (current) drug therapy: Secondary | ICD-10-CM | POA: Diagnosis not present

## 2021-04-18 DIAGNOSIS — L708 Other acne: Secondary | ICD-10-CM | POA: Diagnosis not present

## 2021-04-18 DIAGNOSIS — L2089 Other atopic dermatitis: Secondary | ICD-10-CM | POA: Diagnosis not present

## 2021-05-16 ENCOUNTER — Encounter (HOSPITAL_BASED_OUTPATIENT_CLINIC_OR_DEPARTMENT_OTHER): Payer: Self-pay | Admitting: Obstetrics & Gynecology

## 2021-05-17 ENCOUNTER — Ambulatory Visit (HOSPITAL_BASED_OUTPATIENT_CLINIC_OR_DEPARTMENT_OTHER): Payer: Federal, State, Local not specified - PPO | Admitting: Obstetrics & Gynecology

## 2021-05-22 ENCOUNTER — Telehealth: Payer: Self-pay

## 2021-05-22 NOTE — Telephone Encounter (Signed)
New message   [employer] (Key: [id number])Need help? Call us at [phone] Outcome N/A Your PA request cannot be processed electronically. For further inquiries please contact the number on the back of the member prescription card. (Message [phone]) Drug Roselyn Meier 100MG  tablets Form Teacher, music Program (FEP) Prior Authorization Form Prior Authorizations for members of the [employer] Program [phone]

## 2021-05-24 ENCOUNTER — Ambulatory Visit: Payer: Federal, State, Local not specified - PPO | Admitting: Family Medicine

## 2021-05-29 ENCOUNTER — Ambulatory Visit: Payer: Federal, State, Local not specified - PPO | Admitting: Family Medicine

## 2021-05-29 NOTE — Progress Notes (Deleted)
? ?Established Patient Office Visit ? ?Subjective:  ?Patient ID: Jillian Mansebra A Tourigny, female    DOB: 06/01/1990  Age: 31 y.o. MRN: [id number] ? ?CC: No chief complaint on file. ? ? ?HPI ?Jillian Reyes presents for *** ? ? ? ? ? ? ? ?Past Medical History:  ?Diagnosis Date  ? Acne   ? Asthma due to environmental allergies   ? per pt moderate, prn inhaler,  followed by pcp  ? Bipolar 1 disorder (HCC)   ? Chronic back pain   ? Chronic pelvic pain in female   ? Dyslipidemia 02/18/2013  ? Eczema   ? GAD (generalized anxiety disorder)   ? GERD (gastroesophageal reflux disease)   ? History of abnormal cervical Pap smear   ? History of chlamydia 2015  ? History of gestational diabetes   ? Irregular periods/menstrual cycles   ? Migraines   ? PCOS (polycystic ovarian syndrome)   ? Vitamin D deficiency   ? Wears glasses   ? ? ?Past Surgical History:  ?Procedure Laterality Date  ? BREAST REDUCTION SURGERY Bilateral 01/05/2021  ? Procedure: BREAST REDUCTION WITH LIPOSUCTION;  Surgeon: Peggye Formillingham, Claire S, DO;  Location: [facility];  Service: Plastics;  Laterality: Bilateral;  ? HYSTEROSCOPY WITH NOVASURE N/A 07/06/2020  ? Procedure: HYSTEROSCOPY WITH NOVASURE ABLATION, DILITATION AND CURETTAGE;  Surgeon: Jerene BearsMiller, Mary S, MD;  [facility] [place];  Service: Gynecology;  Laterality: N/A;  ? INCISION AND DRAINAGE Right 01/12/2019  ? Procedure: INCISION AND DRAINAGE OF LESION;  Surgeon: Jerene BearsMiller, Mary S, MD;  [facility] [place];  Service: Gynecology;  Laterality: Right;  ? KNEE ARTHROSCOPY Right 04-16-2019  @[facility]   ? LAPAROSCOPY N/A 01/12/2019  ? Procedure: LAPAROSCOPY DIAGNOSTIC; LYSIS OF ADHESIONS;  Surgeon: Jerene BearsMiller, Mary S, MD;  [facility] [place];  Service: Gynecology;  Laterality: N/A;  possible treatement of endometriosis  ? WISDOM TOOTH EXTRACTION  2016  ? ? ?Family History  ?Problem Relation Age of Onset  ? Heart disease Other   ? Diabetes Father 3335  ?     type 2  ?  Thyroid disease Father   ? Brain cancer Father   ? Cancer Father   ?     tumor of the brain cancer  ? Migraines Mother   ? Kidney disease Mother   ? Colon polyps Mother   ? Hyperlipidemia Other   ?     great grandparents  ? ? ?Social History  ? ?Socioeconomic History  ? Marital status: Married  ?  Spouse name: Not on file  ? Number of children: 1  ? Years of education: Not on file  ? Highest education level: Not on file  ?Occupational History  ? Occupation: clerck   ?  Comment: [employer]  ?Tobacco Use  ? Smoking status: Former  ?  Years: 13.00  ?  Types: Cigarettes  ?  Quit date: 06/29/2018  ?  Years since quitting: 2.9  ? Smokeless tobacco: Never  ?Vaping Use  ? Vaping Use: Some days  ?Substance and Sexual Activity  ? Alcohol use: Not Currently  ? Drug use: Never  ? Sexual activity: Yes  ?  Partners: Male  ?  Comment: husband had vasectomy   ?Other Topics Concern  ? Not on file  ?Social History Narrative  ? Lives home with husband,  4 children.  Works for [employer]. Education college.  ? ?Social Determinants of Health  ? ?Financial Resource Strain: Not on file  ?Food  Insecurity: Not on file  ?Transportation Needs: Not on file  ?Physical Activity: Not on file  ?Stress: Not on file  ?Social Connections: Not on file  ?Intimate Partner Violence: Not on file  ? ? ?Outpatient Medications Prior to Visit  ?Medication Sig Dispense Refill  ? metroNIDAZOLE (FLAGYL) 500 MG tablet Take 1 tablet (500 mg total) by mouth 2 (two) times daily. (Patient not taking: Reported on 04/11/2021) 14 tablet 0  ? albuterol (VENTOLIN HFA) 108 (90 Base) MCG/ACT inhaler Inhale 2 puffs into the lungs every 6 (six) hours as needed for wheezing or shortness of breath. 18 g 2  ? alprazolam (XANAX) 2 MG tablet Take 1 tablet (2 mg total) by mouth at bedtime as needed. for sleep. 30 tablet 0  ? clobetasol cream (TEMOVATE) 0.05 % APPLY TOPICALLY DAILY AS NEEDED. ECZEMA 60 g 3  ? DUPIXENT 300 MG/2ML SOPN     ? EUCRISA 2 % OINT as needed.    ? fluticasone (FLONASE) 50  MCG/ACT nasal spray Place 2 sprays into both nostrils 2 (two) times daily. 18.2 mL 5  ? lamoTRIgine (LAMICTAL) 200 MG tablet Take 1 tablet (200 mg total) by mouth at bedtime. 90 tablet 1  ? metroNIDAZOLE (METROCREAM) 0.75 % cream Apply topically 2 (two) times daily.    ? Multiple Vitamin (MULTIVITAMIN ADULT PO) Take by mouth daily.    ? ondansetron (ZOFRAN-ODT) 8 MG disintegrating tablet Take 1 tablet (8 mg total) by mouth every 8 (eight) hours as needed for nausea or vomiting. 20 tablet 0  ? tacrolimus (PROTOPIC) 0.1 % ointment Apply topically 2 (two) times daily.    ? tiZANidine (ZANAFLEX) 4 MG tablet Take 1/2 tablet to 1 tablet three times daily as needed.  Caution for drowsiness. 30 tablet 5  ? Ubrogepant (UBRELVY) 100 MG TABS Take 1 tablet by mouth as needed (May repeat 1 tablet in 2 hours.  Maximum 2 tablets in 24 hours.). 16 tablet 5  ? ?No facility-administered medications prior to visit.  ? ? ?Allergies  ?Allergen Reactions  ? Cymbalta [Duloxetine Hcl] Nausea And Vomiting  ? ? ?ROS ?Review of Systems ? ?  ?Objective:  ?  ?Physical Exam ? ?There were no vitals taken for this visit. ?Wt Readings from Last 3 Encounters:  ?04/11/21 170 lb (77.1 kg)  ?01/26/21 175 lb 6.4 oz (79.6 kg)  ?01/05/21 179 lb 0.2 oz (81.2 kg)  ? ? ? ?Health Maintenance Due  ?Topic Date Due  ? COVID-19 Vaccine (1) Never done  ? INFLUENZA VACCINE  10/17/2020  ? ? ?There are no preventive care reminders to display for this patient. ? ?Lab Results  ?Component Value Date  ? TSH 1.313 06/21/2020  ? ?Lab Results  ?Component Value Date  ? WBC 6.5 07/06/2020  ? HGB 14.6 07/06/2020  ? HCT 42.9 07/06/2020  ? MCV 91.5 07/06/2020  ? PLT 255 07/06/2020  ? ?Lab Results  ?Component Value Date  ? NA 138 06/21/2020  ? K 3.6 06/21/2020  ? CO2 25 06/21/2020  ? GLUCOSE 84 06/21/2020  ? BUN 8 06/21/2020  ? CREATININE 0.78 06/21/2020  ? BILITOT 0.5 06/21/2020  ? ALKPHOS 56 06/21/2020  ? AST 14 (L) 06/21/2020  ? ALT 13 06/21/2020  ? PROT 6.8 06/21/2020  ?  ALBUMIN 4.3 06/21/2020  ? CALCIUM 8.8 (L) 06/21/2020  ? ANIONGAP 8 06/21/2020  ? GFR 80.07 12/17/2018  ? ?Lab Results  ?Component Value Date  ? CHOL 137 06/21/2020  ? ?Lab Results  ?Component Value  Date  ? HDL 46 06/21/2020  ? ?Lab Results  ?Component Value Date  ? LDLCALC 68 06/21/2020  ? ?Lab Results  ?Component Value Date  ? TRIG 117 06/21/2020  ? ?Lab Results  ?Component Value Date  ? CHOLHDL 3.0 06/21/2020  ? ?Lab Results  ?Component Value Date  ? HGBA1C 4.9 06/21/2020  ? ? ?  ?Assessment & Plan:  ? ?Problem List Items Addressed This Visit   ?None ? ? ?No orders of the defined types were placed in this encounter. ? ? ?Follow-up: No follow-ups on file.  ? ? ?Clayborne Dana, NP ?

## 2021-06-06 ENCOUNTER — Telehealth: Payer: Self-pay | Admitting: Neurology

## 2021-06-06 NOTE — Telephone Encounter (Signed)
Patient called and stated that her insurance and the coupon that she tried has been denied for her Jillian Reyes. ?

## 2021-06-06 NOTE — Telephone Encounter (Signed)
Patient advised PA started.  ?Pt advised that the PA may be denied due to her insurance. Assistance may not cover it as well. ?

## 2021-06-09 ENCOUNTER — Ambulatory Visit: Payer: Federal, State, Local not specified - PPO | Admitting: Plastic Surgery

## 2021-06-09 ENCOUNTER — Other Ambulatory Visit: Payer: Self-pay

## 2021-06-09 ENCOUNTER — Encounter: Payer: Self-pay | Admitting: Plastic Surgery

## 2021-06-09 DIAGNOSIS — N62 Hypertrophy of breast: Secondary | ICD-10-CM | POA: Diagnosis not present

## 2021-06-09 NOTE — Telephone Encounter (Signed)
Pt is calling in wanting to know the name of the person she needs to speak with concerning making an appointment for sizing for the silicone breast and the quote for how much it will cost.  Pt would like to have a call back. ?

## 2021-06-09 NOTE — Progress Notes (Signed)
? ?  Subjective:  ? ? Patient ID: Jillian Reyes, female    DOB: Jun 24, 1990, 31 y.o.   MRN: [id number] ? ?The patient is a lovely 31 year old female here for follow-up on her bilateral breast reduction from October 2022 she had over 400 g removed from both breasts.  She does not like the lack of fullness in the upper pole.  We discussed the options of an implant and fat filling.  Both would make her breasts larger.  I really want her to think it over before making the decision.  She says she has thought a lot a lot about it and discussed it with her husband.  She also mentions wanting to possibly would not have to wear a bra.  I do not think that that is an option for the look that she wants.  The left breast has a slight more volume at the inferior pole.  Other than that she looks really really good especially compared to her preop picture. ? ? ? ?Review of Systems  ?Constitutional: Negative.   ?Eyes: Negative.   ?Respiratory: Negative.  Negative for chest tightness and shortness of breath.   ?Cardiovascular: Negative.   ?Gastrointestinal: Negative.   ?Endocrine: Negative.   ?Genitourinary: Negative.   ?Musculoskeletal: Negative.   ?Skin: Negative.   ? ?   ?Objective:  ? Physical Exam ?Constitutional:   ?   Appearance: Normal appearance.  ?HENT:  ?   Head: Normocephalic.  ?Cardiovascular:  ?   Rate and Rhythm: Normal rate.  ?   Pulses: Normal pulses.  ?Pulmonary:  ?   Effort: Pulmonary effort is normal.  ?Musculoskeletal:     ?   General: No swelling or deformity.  ?Skin: ?   General: Skin is warm.  ?   Capillary Refill: Capillary refill takes less than 2 seconds.  ?   Coloration: Skin is not jaundiced.  ?   Findings: No bruising.  ?Neurological:  ?   Mental Status: She is alert and oriented to person, place, and time.  ?Psychiatric:     ?   Mood and Affect: Mood normal.     ?   Behavior: Behavior normal.     ?   Thought Content: Thought content normal.  ? ? ?   ?Assessment & Plan:  ? ?  ICD-10-CM   ?1. Symptomatic  mammary hypertrophy  N62   ?  ?  ?Pictures were obtained of the patient and placed in the chart with the patient's or guardian's permission. ? ?We will send the patient a quote for implant placement bilaterally and fat grafting bilaterally.  She is going to think it over and let us know.  If she wants only the left breast fixed then we can do that in the office with a small incision at the inframammary fold. ?

## 2021-06-14 ENCOUNTER — Encounter (HOSPITAL_BASED_OUTPATIENT_CLINIC_OR_DEPARTMENT_OTHER): Payer: Self-pay | Admitting: Obstetrics & Gynecology

## 2021-06-14 ENCOUNTER — Ambulatory Visit (INDEPENDENT_AMBULATORY_CARE_PROVIDER_SITE_OTHER): Payer: Federal, State, Local not specified - PPO | Admitting: Obstetrics & Gynecology

## 2021-06-14 ENCOUNTER — Other Ambulatory Visit (HOSPITAL_COMMUNITY)
Admission: RE | Admit: 2021-06-14 | Discharge: 2021-06-14 | Disposition: A | Discharge status: Home / Self Care | Payer: Federal, State, Local not specified - PPO | Source: Ambulatory Visit | Attending: Obstetrics & Gynecology | Admitting: Obstetrics & Gynecology

## 2021-06-14 VITALS — BP 122/84 | HR 73 | Ht 64.0 in | Wt 162.6 lb

## 2021-06-14 DIAGNOSIS — Z124 Encounter for screening for malignant neoplasm of cervix: Secondary | ICD-10-CM | POA: Insufficient documentation

## 2021-06-14 DIAGNOSIS — Z01419 Encounter for gynecological examination (general) (routine) without abnormal findings: Secondary | ICD-10-CM

## 2021-06-14 DIAGNOSIS — Z9889 Other specified postprocedural states: Secondary | ICD-10-CM | POA: Diagnosis not present

## 2021-06-14 DIAGNOSIS — E282 Polycystic ovarian syndrome: Secondary | ICD-10-CM

## 2021-06-14 NOTE — Progress Notes (Signed)
31 y.o. G49P1011 Married Other or two or more races female here for annual exam.  Is not having any bleeding.  Does still have some cramping.  Currently satisfied with endometrial ablation done 06/2020.  Decided to have breast reduction done last year.  Please with this as well.   ? ?No LMP recorded. (Menstrual status: Irregular Periods).          ?Sexually active: Yes.    ?The current method of family planning is  vasectomy ?Exercising: walking during the day ?Smoker:  no ? ?Health Maintenance: ?Pap:  12/09/2018 Negative ?History of abnormal Pap:  remote hx ?MMG:  guidelines reviewed ?Colonoscopy:  guidelines reviewed ?Screening Labs: 06/2020 ? ? reports that she quit smoking about 2 years ago. Her smoking use included cigarettes. She has never used smokeless tobacco. She reports that she does not currently use alcohol. She reports that she does not use drugs. ? ?Past Medical History:  ?Diagnosis Date  ? Acne   ? Asthma due to environmental allergies   ? per pt moderate, prn inhaler,  followed by pcp  ? Bipolar 1 disorder ([place])   ? Chronic back pain   ? Chronic pelvic pain in female   ? Dyslipidemia 02/18/2013  ? Eczema   ? GAD (generalized anxiety disorder)   ? GERD (gastroesophageal reflux disease)   ? History of abnormal cervical Pap smear   ? History of chlamydia 2015  ? History of gestational diabetes   ? Irregular periods/menstrual cycles   ? Migraines   ? PCOS (polycystic ovarian syndrome)   ? Vitamin D deficiency   ? Wears glasses   ? ? ?Past Surgical History:  ?Procedure Laterality Date  ? BREAST REDUCTION SURGERY Bilateral 01/05/2021  ? Procedure: BREAST REDUCTION WITH LIPOSUCTION;  Surgeon: Wallace Going, DO;  Location: [place];  Service: Plastics;  Laterality: Bilateral;  ? HYSTEROSCOPY WITH NOVASURE N/A 07/06/2020  ? Procedure: HYSTEROSCOPY WITH NOVASURE ABLATION, DILITATION AND CURETTAGE;  Surgeon: Megan Salon, MD;  Location: [facility];  Service: Gynecology;   Laterality: N/A;  ? INCISION AND DRAINAGE Right 01/12/2019  ? Procedure: INCISION AND DRAINAGE OF LESION;  Surgeon: Megan Salon, MD;  Location: [facility];  Service: Gynecology;  Laterality: Right;  ? KNEE ARTHROSCOPY Right 04-16-2019  @[facility]   ? LAPAROSCOPY N/A 01/12/2019  ? Procedure: LAPAROSCOPY DIAGNOSTIC; LYSIS OF ADHESIONS;  Surgeon: Megan Salon, MD;  Location: [facility];  Service: Gynecology;  Laterality: N/A;  possible treatement of endometriosis  ? [place] EXTRACTION  2016  ? ? ?Current Outpatient Medications  ?Medication Sig Dispense Refill  ? albuterol (VENTOLIN HFA) 108 (90 Base) MCG/ACT inhaler Inhale 2 puffs into the lungs every 6 (six) hours as needed for wheezing or shortness of breath. 18 g 2  ? alprazolam (XANAX) 2 MG tablet Take 1 tablet (2 mg total) by mouth at bedtime as needed. for sleep. 30 tablet 0  ? clobetasol cream (TEMOVATE) 0.05 % APPLY TOPICALLY DAILY AS NEEDED. ECZEMA 60 g 3  ? DUPIXENT 300 MG/2ML SOPN     ? EUCRISA 2 % OINT as needed.    ? fluticasone (FLONASE) 50 MCG/ACT nasal spray Place 2 sprays into both nostrils 2 (two) times daily. 18.2 mL 5  ? metroNIDAZOLE (METROCREAM) 0.75 % cream Apply topically 2 (two) times daily.    ? Multiple Vitamin (MULTIVITAMIN ADULT PO) Take by mouth daily.    ? ondansetron (ZOFRAN-ODT) 8 MG disintegrating tablet Take  1 tablet (8 mg total) by mouth every 8 (eight) hours as needed for nausea or vomiting. 20 tablet 0  ? tacrolimus (PROTOPIC) 0.1 % ointment Apply topically 2 (two) times daily.    ? Ubrogepant (UBRELVY) 100 MG TABS Take 1 tablet by mouth as needed (May repeat 1 tablet in 2 hours.  Maximum 2 tablets in 24 hours.). 16 tablet 5  ? ?No current facility-administered medications for this visit.  ? ? ?Family History  ?Problem Relation Age of Onset  ? Heart disease Other   ? Diabetes Father 54  ?     type 2  ? Thyroid disease Father   ? Brain cancer Father   ? Cancer Father   ?     tumor of the brain  cancer  ? Migraines Mother   ? Kidney disease Mother   ? Colon polyps Mother   ? Hyperlipidemia Other   ?     great grandparents  ? ? ?Review of Systems  ?All other systems reviewed and are negative. ? ?Exam:   ?BP 122/84 (BP Location: Right Arm, Patient Position: Sitting, Cuff Size: Normal)   Pulse 73   Ht 5\' 4"  (1.626 m) Comment: Report  Wt 162 lb 9.6 oz (73.8 kg)   BMI 27.91 kg/m?   Height: 5\' 4"  (162.6 cm) (Report) ? ?General appearance: alert, cooperative and appears stated age ?Head: Normocephalic, without obvious abnormality, atraumatic ?Neck: no adenopathy, supple, symmetrical, trachea midline and thyroid normal to inspection and palpation ?Lungs: clear to auscultation bilaterally ?Breasts: normal appearance, no masses or tenderness ?Heart: regular rate and rhythm ?Abdomen: soft, non-tender; bowel sounds normal; no masses,  no organomegaly ?Extremities: extremities normal, atraumatic, no cyanosis or edema ?Skin: Skin color, texture, turgor normal. No rashes or lesions ?Lymph nodes: Cervical, supraclavicular, and axillary nodes normal. ?No abnormal inguinal nodes palpated ?Neurologic: Grossly normal ? ? ?Pelvic: External genitalia:  no lesions ?             Urethra:  normal appearing urethra with no masses, tenderness or lesions ?             Bartholins and Skenes: normal    ?             Vagina: normal appearing vagina with normal color and no discharge, no lesions ?             Cervix: no lesions ?             Pap taken: Yes.   ?Bimanual Exam:  Uterus:  normal size, contour, position, consistency, mobility, non-tender ?             Adnexa: normal adnexa and no mass, fullness, tenderness ?              Rectovaginal: Confirms ?              Anus:  normal sphincter tone, no lesions ? ?Chaperone, Octaviano Batty, CMA, was present for exam. ? ?Assessment/Plan: ?1. Well woman exam with routine gynecological exam ?- Pap smear obtained today ?- Mammogram guidelines reviewed ?- Colonoscopy guidelines reviewed ?-  lab work done done with PCP ?- vaccines reviewed/updated ? ?2. Cervical cancer screening ?- Cytology - PAP( [place]) ? ?3. History of endometrial ablation ?- doing well ? ?4. PCOS (polycystic ovarian syndrome) ? ? ?

## 2021-06-16 ENCOUNTER — Telehealth: Payer: Self-pay

## 2021-06-16 NOTE — Telephone Encounter (Signed)
I called patient to schedule procedure on 09/05/2021.  Patient has questions regarding specifics of procedure, post-procedure care, and recovery.  Please call. ?

## 2021-06-18 LAB — CYTOLOGY - PAP
Comment: NEGATIVE
Diagnosis: NEGATIVE
High risk HPV: NEGATIVE

## 2021-06-30 ENCOUNTER — Encounter: Payer: Self-pay | Admitting: Family Medicine

## 2021-06-30 ENCOUNTER — Other Ambulatory Visit: Payer: Self-pay | Admitting: Family Medicine

## 2021-06-30 ENCOUNTER — Ambulatory Visit: Payer: Federal, State, Local not specified - PPO | Admitting: Family Medicine

## 2021-06-30 VITALS — BP 108/64 | HR 72 | Temp 98.0°F | Ht 64.0 in | Wt 165.5 lb

## 2021-06-30 DIAGNOSIS — J0140 Acute pansinusitis, unspecified: Secondary | ICD-10-CM | POA: Diagnosis not present

## 2021-06-30 DIAGNOSIS — Z79899 Other long term (current) drug therapy: Secondary | ICD-10-CM | POA: Diagnosis not present

## 2021-06-30 DIAGNOSIS — E785 Hyperlipidemia, unspecified: Secondary | ICD-10-CM | POA: Diagnosis not present

## 2021-06-30 DIAGNOSIS — J4521 Mild intermittent asthma with (acute) exacerbation: Secondary | ICD-10-CM

## 2021-06-30 MED ORDER — PREDNISONE 20 MG PO TABS: 40.0000 mg | ORAL_TABLET | Freq: Every day | ORAL | 0 refills | Status: AC

## 2021-06-30 MED ORDER — ALBUTEROL SULFATE HFA 108 (90 BASE) MCG/ACT IN AERS: 2.0000 | INHALATION_SPRAY | Freq: Four times a day (QID) | RESPIRATORY_TRACT | 2 refills | Status: AC | PRN

## 2021-06-30 MED ORDER — ALPRAZOLAM 2 MG PO TABS: 2.0000 mg | ORAL_TABLET | Freq: Every evening | ORAL | 0 refills | Status: DC | PRN

## 2021-06-30 NOTE — Progress Notes (Signed)
Chief Complaint  ?Patient presents with  ? Cough  ?  Sore throat ?Congestion ?  ? ? ?Vista Lawman here for URI complaints. Here w husband. ? ?Duration: 2 days  ?Associated symptoms: sinus headache, sinus congestion, sinus pain, rhinorrhea, itchy watery eyes, ear pain, sore throat, wheezing, shortness of breath, myalgia, and cough ?Denies: ear drainage and fevers, N/V ?Treatment to date: Mucinex ?Sick contacts: Yes; son ? ?Past Medical History:  ?Diagnosis Date  ? Acne   ? Asthma due to environmental allergies   ? per pt moderate, prn inhaler,  followed by pcp  ? Bipolar 1 disorder ([place])   ? Chronic back pain   ? Chronic pelvic pain in female   ? Dyslipidemia 02/18/2013  ? Eczema   ? GAD (generalized anxiety disorder)   ? GERD (gastroesophageal reflux disease)   ? History of abnormal cervical Pap smear   ? History of chlamydia 2015  ? History of gestational diabetes   ? Irregular periods/menstrual cycles   ? Migraines   ? PCOS (polycystic ovarian syndrome)   ? Vitamin D deficiency   ? Wears glasses   ? ? ?Objective ?BP 108/64   Pulse 72   Temp 98 ?F (36.7 ?C) (Oral)   Ht 5\' 4"  (1.626 m)   Wt 165 lb 8 oz (75.1 kg)   SpO2 97%   BMI 28.41 kg/m?  ?General: Awake, alert, appears stated age ?HEENT: AT, [place], ears patent b/l and TM's neg, nares patent w/o discharge, pharynx pink and without exudates, MMM, frontal and max sinuses ttp b/l ?Neck: No masses or asymmetry ?Heart: RRR ?Lungs: CTAB, no accessory muscle use ?Psych: Age appropriate judgment and insight, normal mood and affect ? ?Mild intermittent asthma with acute exacerbation - Plan: albuterol (VENTOLIN HFA) 108 (90 Base) MCG/ACT inhaler, predniSONE (DELTASONE) 20 MG tablet ? ?Acute pansinusitis, recurrence not specified ? ?Dyslipidemia - Plan: alprazolam (XANAX) 2 MG tablet ? ?Encounter for long-term (current) use of high-risk medication - Plan: Drug Monitoring Panel [phone] , Urine ? ?Exacerbation of chronic issue. 5 d pred burst 40 mg/d. Hopefully this  helps w sinuses as well.  Send a message in a few days if no improvement and we will send in doxycycline along with Diflucan. ?Continue to push fluids, practice good hand hygiene, cover mouth when coughing. ?F/u prn. If starting to experience fevers, shaking, or shortness of breath, seek immediate care. ?Pt voiced understanding and agreement to the plan. ? ?Shelda Pal, DO ?06/30/21 ?4:16 PM ? ?

## 2021-06-30 NOTE — Patient Instructions (Addendum)
Continue to push fluids, practice good hand hygiene, and cover your mouth if you cough. ? ?If you start having fevers, shaking or shortness of breath, seek immediate care. ? ?OK to take Tylenol 1000 mg (2 extra strength tabs) or 975 mg (3 regular strength tabs) every 6 hours as needed. ? ?Send me a message Monday evening/Tuesday if not improving, sooner if getting worse.  ? ?Let us know if you need anything. ? ? ?

## 2021-07-02 ENCOUNTER — Encounter: Payer: Self-pay | Admitting: Family Medicine

## 2021-07-03 ENCOUNTER — Other Ambulatory Visit (HOSPITAL_COMMUNITY): Payer: Self-pay

## 2021-07-03 ENCOUNTER — Telehealth: Payer: Self-pay | Admitting: Family Medicine

## 2021-07-03 ENCOUNTER — Other Ambulatory Visit: Payer: Self-pay | Admitting: Family Medicine

## 2021-07-03 LAB — DRUG MONITORING PANEL 376104, URINE
Amphetamines: NEGATIVE ng/mL (ref <500)
Barbiturates: NEGATIVE ng/mL (ref <300)
Benzodiazepines: NEGATIVE ng/mL (ref <100)
Cocaine Metabolite: NEGATIVE ng/mL (ref <150)
Desmethyltramadol: NEGATIVE ng/mL (ref <100)
Opiates: NEGATIVE ng/mL (ref <100)
Oxycodone: NEGATIVE ng/mL (ref <100)
Tramadol: NEGATIVE ng/mL (ref <100)

## 2021-07-03 LAB — DM TEMPLATE

## 2021-07-03 MED ORDER — FLUCONAZOLE 150 MG PO TABS: ORAL_TABLET | ORAL | 0 refills | Status: DC

## 2021-07-03 MED ORDER — DOXYCYCLINE HYCLATE 100 MG PO TABS
100.0000 mg | ORAL_TABLET | Freq: Two times a day (BID) | ORAL | 0 refills | Status: DC
  Filled 2021-07-03: qty 14, 7d supply, fill #0

## 2021-07-03 MED ORDER — DOXYCYCLINE HYCLATE 100 MG PO TABS
100.0000 mg | ORAL_TABLET | Freq: Two times a day (BID) | ORAL | 0 refills | Status: AC
Start: 2021-07-03 — End: 2021-07-10

## 2021-07-03 MED ORDER — FLUCONAZOLE 150 MG PO TABS
ORAL_TABLET | ORAL | 0 refills | Status: DC
  Filled 2021-07-03: qty 2, 3d supply, fill #0

## 2021-07-03 NOTE — Telephone Encounter (Signed)
Has been sent in to the [facility] [address] ?

## 2021-07-03 NOTE — Telephone Encounter (Signed)
Patient needs [facility] medication [facility] to [facility] [address]. Saw [place]ling today. Please advise.  ? ?1.fluconazole (DIFLUCAN) 150 MG tablet [[id number]]  ?2.doxycycline (VIBRA-TABS) 100 MG tablet [[id number]]  ? ? ? ? ?[id number] [place] [place] - [address]  ?[address] [place], [place] [address]  ?Phone:  [phone]  Fax:  [phone]  ?

## 2021-07-03 NOTE — Telephone Encounter (Signed)
Patient saw Dr. Carmelia Roller on 04/14, not today.  ?

## 2021-07-05 NOTE — Telephone Encounter (Signed)
Spoke with patient the end of March. Explained to her she will have local anesthesia. Dr. Ulice Bold will make small incision at the [facility], tighten/tuck skin, suture, and cover with a dressing. Will need to follow up 2 weeks to have sutures removed. Patient was a little leery having surgery performed in office. Assured her she will be completely numb and will not feel anything. ?

## 2021-07-26 DIAGNOSIS — L708 Other acne: Secondary | ICD-10-CM | POA: Diagnosis not present

## 2021-07-26 DIAGNOSIS — L218 Other seborrheic dermatitis: Secondary | ICD-10-CM | POA: Diagnosis not present

## 2021-07-26 DIAGNOSIS — L501 Idiopathic urticaria: Secondary | ICD-10-CM | POA: Diagnosis not present

## 2021-07-26 DIAGNOSIS — L2089 Other atopic dermatitis: Secondary | ICD-10-CM | POA: Diagnosis not present

## 2021-08-01 ENCOUNTER — Ambulatory Visit: Payer: Federal, State, Local not specified - PPO | Admitting: Neurology

## 2021-09-05 ENCOUNTER — Other Ambulatory Visit: Payer: Self-pay | Admitting: Family Medicine

## 2021-09-05 ENCOUNTER — Ambulatory Visit: Payer: Federal, State, Local not specified - PPO | Admitting: Neurology

## 2021-09-05 ENCOUNTER — Ambulatory Visit: Payer: Federal, State, Local not specified - PPO | Admitting: Plastic Surgery

## 2021-09-05 ENCOUNTER — Encounter: Payer: Self-pay | Admitting: Plastic Surgery

## 2021-09-05 VITALS — BP 112/76 | HR 75 | Ht 64.0 in | Wt 159.6 lb

## 2021-09-05 DIAGNOSIS — N62 Hypertrophy of breast: Secondary | ICD-10-CM

## 2021-09-05 DIAGNOSIS — N651 Disproportion of reconstructed breast: Secondary | ICD-10-CM

## 2021-09-05 DIAGNOSIS — E785 Hyperlipidemia, unspecified: Secondary | ICD-10-CM

## 2021-09-05 DIAGNOSIS — Z719 Counseling, unspecified: Secondary | ICD-10-CM

## 2021-09-05 NOTE — Telephone Encounter (Signed)
Last OV--06/30/2021 Last RF--06/30/2021---#30 no refills Last UDS---09/17/2017

## 2021-09-05 NOTE — Progress Notes (Signed)
Procedure Note  Preoperative Dx: left breast asymmetry  Postoperative Dx: Same  Procedure: excision of left breast skin for improved symmetry  Anesthesia: Lidocaine 1% with 1:100,000 epinephrine   Description of Procedure: Risks and complications were explained to the patient.  Consent was confirmed and the patient understands the risks and benefits.  The potential complications and alternatives were explained and the patient consents.  The patient expressed understanding the option of not having the procedure and the risks of a scar.  Time out was called and all information was confirmed to be correct.    The area was prepped and drapped.  Lidocaine 1% with epinepherine was injected in the subcutaneous area.  After waiting several minutes for the local to take affect a #15 blade was used to excise the lower 2 cm of the vertical limb to lower the NAC.   A 4-0 Monocryl was used to close the deep layers with simple interrupted stitches.  The skin edges were reapproximated with 4-0 Monocryl subcuticular running closure.  A dressing was applied.  The patient was given instructions on how to care for the area and a follow up appointment.  Jillian Reyes tolerated the procedure well and there were no complications. No specimen

## 2021-09-14 ENCOUNTER — Telehealth: Payer: Federal, State, Local not specified - PPO | Admitting: Physician Assistant

## 2021-09-14 DIAGNOSIS — B9689 Other specified bacterial agents as the cause of diseases classified elsewhere: Secondary | ICD-10-CM | POA: Diagnosis not present

## 2021-09-14 DIAGNOSIS — J019 Acute sinusitis, unspecified: Secondary | ICD-10-CM

## 2021-09-14 MED ORDER — AMOXICILLIN-POT CLAVULANATE 875-125 MG PO TABS: 1.0000 | ORAL_TABLET | Freq: Two times a day (BID) | ORAL | 0 refills | Status: DC

## 2021-09-14 NOTE — Progress Notes (Signed)
Virtual Visit Consent   Jillian Reyes, you are scheduled for a virtual visit with a [facility] provider today. Just as with appointments in the office, your consent must be obtained to participate. Your consent will be active for this visit and any virtual visit you may have with one of our providers in the next 365 days. If you have a MyChart account, a copy of this consent can be sent to you electronically.  As this is a virtual visit, video technology does not allow for your provider to perform a traditional examination. This may limit your provider's ability to fully assess your condition. If your provider identifies any concerns that need to be evaluated in person or the need to arrange testing (such as labs, EKG, etc.), we will make arrangements to do so. Although advances in technology are sophisticated, we cannot ensure that it will always work on either your end or our end. If the connection with a video visit is poor, the visit may have to be switched to a telephone visit. With either a video or telephone visit, we are not always able to ensure that we have a secure connection.  By engaging in this virtual visit, you consent to the provision of healthcare and authorize for your insurance to be billed (if applicable) for the services provided during this visit. Depending on your insurance coverage, you may receive a charge related to this service.  I need to obtain your verbal consent now. Are you willing to proceed with your visit today? Jillian Reyes has provided verbal consent on 09/14/2021 for a virtual visit (video or telephone). Piedad Climes, [place]  Date: 09/14/2021 8:04 AM  Virtual Visit via Video Note   I, Piedad Climes, PA-C, attempted to connect with Jillian Reyes; MRN [id number] on 09/14/21 via [employer] to complete a video urgent care visit. The patient was unable to successfully connect to the video platform. As such, the patient was contacted by this  provider via phone to complete the encounter.   Location: Patient: Virtual Visit Location Patient: Home Provider: Virtual Visit Location Provider: Home Office   I discussed the limitations of evaluation and management by telemedicine and the availability of in person appointments. The patient expressed understanding and agreed to proceed.    History of Present Illness: Jillian Reyes is a 31 y.o. who identifies as a female who was assigned female at birth, and is being seen today for possible sinus infection. Notes symptoms starting 5 days ago and progressively worsening symptoms since onset. Notes head congestion, sinus pressure/pain with thick drainage. Denies fever. Some chest congestion and coughing up drainage. Denies recent travel. Denies sick contact. COVID test negative. Well   HPI: HPI  Problems:  Patient Active Problem List   Diagnosis Date Noted   Symptomatic mammary hypertrophy 08/09/2020   PCOS (polycystic ovarian syndrome) 10/16/2019   Grief reaction 05/27/2019   Painful coitus, female 12/05/2018   Tachycardia 07/14/2018   Hypocalcemia 12/22/2016   Right hand pain 12/22/2016   Bilateral calf pain 12/21/2016   Right knee pain 12/21/2016   Hemorrhoids 12/01/2016   History of hidradenitis suppurativa 10/14/2016   Bilateral neck pain 03/16/2016   Preventative health care 04/10/2015   Vitamin D deficiency 04/10/2015   Dysuria 01/10/2014   Allergic rhinitis 11/30/2013   Migraine without aura and without status migrainosus, not intractable 11/17/2013   Encounter for preconception consultation 07/27/2013   Insomnia 07/02/2013   Tendinopathy of rotator cuff 03/08/2013  Dyslipidemia 02/18/2013   Eczema 01/06/2012   Acne 01/06/2012   Mid back pain    Allergic state    Anxiety as acute reaction to exceptional stress 05/01/2011   Bipolar 1 disorder (HCC) 07/05/2010    Allergies:  Allergies  Allergen Reactions   Cymbalta [Duloxetine Hcl] Nausea And Vomiting    Medications:  Current Outpatient Medications:    amoxicillin-clavulanate (AUGMENTIN) 875-125 MG tablet, Take 1 tablet by mouth 2 (two) times daily., Disp: 20 tablet, Rfl: 0   albuterol (VENTOLIN HFA) 108 (90 Base) MCG/ACT inhaler, Inhale 2 puffs into the lungs every 6 (six) hours as needed for wheezing or shortness of breath., Disp: 18 g, Rfl: 2   alprazolam (XANAX) 2 MG tablet, TAKE 1 TABLET (2 MG TOTAL) BY MOUTH AT BEDTIME AS NEEDED. FOR SLEEP., Disp: 30 tablet, Rfl: 1   clobetasol cream (TEMOVATE) 0.05 %, APPLY TOPICALLY DAILY AS NEEDED. ECZEMA, Disp: 60 g, Rfl: 3   DUPIXENT 300 MG/2ML SOPN, , Disp: , Rfl:    EUCRISA 2 % OINT, as needed., Disp: , Rfl:    fluticasone (FLONASE) 50 MCG/ACT nasal spray, Place 2 sprays into both nostrils 2 (two) times daily., Disp: 18.2 mL, Rfl: 5   metroNIDAZOLE (METROCREAM) 0.75 % cream, Apply topically 2 (two) times daily., Disp: , Rfl:    Multiple Vitamin (MULTIVITAMIN ADULT PO), Take by mouth daily., Disp: , Rfl:    ondansetron (ZOFRAN-ODT) 8 MG disintegrating tablet, Take 1 tablet (8 mg total) by mouth every 8 (eight) hours as needed for nausea or vomiting., Disp: 20 tablet, Rfl: 0   tacrolimus (PROTOPIC) 0.1 % ointment, Apply topically 2 (two) times daily., Disp: , Rfl:    Ubrogepant (UBRELVY) 100 MG TABS, Take 1 tablet by mouth as needed (May repeat 1 tablet in 2 hours.  Maximum 2 tablets in 24 hours.)., Disp: 16 tablet, Rfl: 5  Observations/Objective: No labored breathing. Speech is clear and coherent with logical content.  Patient is alert and oriented at baseline.   Assessment and Plan: 1. Acute bacterial sinusitis - amoxicillin-clavulanate (AUGMENTIN) 875-125 MG tablet; Take 1 tablet by mouth 2 (two) times daily.  Dispense: 20 tablet; Refill: 0  Rx Augmentin.  Increase fluids.  Rest.  Saline nasal spray.  Probiotic.  Mucinex as directed.  Humidifier in bedroom. Continue Flonase and allergy medications.  Call or return to clinic if symptoms are  not improving.   Follow Up Instructions: I discussed the assessment and treatment plan with the patient. The patient was provided an opportunity to ask questions and all were answered. The patient agreed with the plan and demonstrated an understanding of the instructions.  A copy of instructions were sent to the patient via MyChart unless otherwise noted below.   The patient was advised to call back or seek an in-person evaluation if the symptoms worsen or if the condition fails to improve as anticipated.  Time:  I spent 8 minutes with the patient via telehealth technology discussing the above problems/concerns.    Piedad Climes, PA-C

## 2021-09-14 NOTE — Patient Instructions (Signed)
Jillian Reyes, thank you for joining Piedad Climes, PA-C for today's virtual visit.  While this provider is not your primary care provider (PCP), if your PCP is located in our provider database this encounter information will be shared with them immediately following your visit.  Consent: (Patient) Jillian Reyes provided verbal consent for this virtual visit at the beginning of the encounter.  Current Medications:  Current Outpatient Medications:    albuterol (VENTOLIN HFA) 108 (90 Base) MCG/ACT inhaler, Inhale 2 puffs into the lungs every 6 (six) hours as needed for wheezing or shortness of breath., Disp: 18 g, Rfl: 2   alprazolam (XANAX) 2 MG tablet, TAKE 1 TABLET (2 MG TOTAL) BY MOUTH AT BEDTIME AS NEEDED. FOR SLEEP., Disp: 30 tablet, Rfl: 1   clobetasol cream (TEMOVATE) 0.05 %, APPLY TOPICALLY DAILY AS NEEDED. ECZEMA, Disp: 60 g, Rfl: 3   DUPIXENT 300 MG/2ML SOPN, , Disp: , Rfl:    EUCRISA 2 % OINT, as needed., Disp: , Rfl:    fluticasone (FLONASE) 50 MCG/ACT nasal spray, Place 2 sprays into both nostrils 2 (two) times daily., Disp: 18.2 mL, Rfl: 5   metroNIDAZOLE (METROCREAM) 0.75 % cream, Apply topically 2 (two) times daily., Disp: , Rfl:    Multiple Vitamin (MULTIVITAMIN ADULT PO), Take by mouth daily., Disp: , Rfl:    ondansetron (ZOFRAN-ODT) 8 MG disintegrating tablet, Take 1 tablet (8 mg total) by mouth every 8 (eight) hours as needed for nausea or vomiting., Disp: 20 tablet, Rfl: 0   tacrolimus (PROTOPIC) 0.1 % ointment, Apply topically 2 (two) times daily., Disp: , Rfl:    Ubrogepant (UBRELVY) 100 MG TABS, Take 1 tablet by mouth as needed (May repeat 1 tablet in 2 hours.  Maximum 2 tablets in 24 hours.)., Disp: 16 tablet, Rfl: 5   Medications ordered in this encounter:  No orders of the defined types were placed in this encounter.    *If you need refills on other medications prior to your next appointment, please contact your pharmacy*  Follow-Up: Call back or  seek an in-person evaluation if the symptoms worsen or if the condition fails to improve as anticipated.  Other Instructions Please take antibiotic as directed.  Increase fluid intake.  Use Saline nasal spray.  Take a daily multivitamin. Continue Flonase and OTC medications.  Place a humidifier in the bedroom.  Please call or return clinic if symptoms are not improving.  Sinusitis Sinusitis is redness, soreness, and swelling (inflammation) of the paranasal sinuses. Paranasal sinuses are air pockets within the bones of your face (beneath the eyes, the middle of the forehead, or above the eyes). In healthy paranasal sinuses, mucus is able to drain out, and air is able to circulate through them by way of your nose. However, when your paranasal sinuses are inflamed, mucus and air can become trapped. This can allow bacteria and other germs to grow and cause infection. Sinusitis can develop quickly and last only a short time (acute) or continue over a long period (chronic). Sinusitis that lasts for more than 12 weeks is considered chronic.  CAUSES  Causes of sinusitis include: Allergies. Structural abnormalities, such as displacement of the cartilage that separates your nostrils (deviated septum), which can decrease the air flow through your nose and sinuses and affect sinus drainage. Functional abnormalities, such as when the small hairs (cilia) that line your sinuses and help remove mucus do not work properly or are not present. SYMPTOMS  Symptoms of acute and chronic sinusitis are  the same. The primary symptoms are pain and pressure around the affected sinuses. Other symptoms include: Upper toothache. Earache. Headache. Bad breath. Decreased sense of smell and taste. A cough, which worsens when you are lying flat. Fatigue. Fever. Thick drainage from your nose, which often is green and may contain pus (purulent). Swelling and warmth over the affected sinuses. DIAGNOSIS  Your caregiver will  perform a physical exam. During the exam, your caregiver may: Look in your nose for signs of abnormal growths in your nostrils (nasal polyps). Tap over the affected sinus to check for signs of infection. View the inside of your sinuses (endoscopy) with a special imaging device with a light attached (endoscope), which is inserted into your sinuses. If your caregiver suspects that you have chronic sinusitis, one or more of the following tests may be recommended: Allergy tests. Nasal culture A sample of mucus is taken from your nose and sent to a lab and screened for bacteria. Nasal cytology A sample of mucus is taken from your nose and examined by your caregiver to determine if your sinusitis is related to an allergy. TREATMENT  Most cases of acute sinusitis are related to a viral infection and will resolve on their own within 10 days. Sometimes medicines are prescribed to help relieve symptoms (pain medicine, decongestants, nasal steroid sprays, or saline sprays).  However, for sinusitis related to a bacterial infection, your caregiver will prescribe antibiotic medicines. These are medicines that will help kill the bacteria causing the infection.  Rarely, sinusitis is caused by a fungal infection. In theses cases, your caregiver will prescribe antifungal medicine. For some cases of chronic sinusitis, surgery is needed. Generally, these are cases in which sinusitis recurs more than 3 times per year, despite other treatments. HOME CARE INSTRUCTIONS  Drink plenty of water. Water helps thin the mucus so your sinuses can drain more easily. Use a humidifier. Inhale steam 3 to 4 times a day (for example, sit in the bathroom with the shower running). Apply a warm, moist washcloth to your face 3 to 4 times a day, or as directed by your caregiver. Use saline nasal sprays to help moisten and clean your sinuses. Take over-the-counter or prescription medicines for pain, discomfort, or fever only as directed by  your caregiver. SEEK IMMEDIATE MEDICAL CARE IF: You have increasing pain or severe headaches. You have nausea, vomiting, or drowsiness. You have swelling around your face. You have vision problems. You have a stiff neck. You have difficulty breathing. MAKE SURE YOU:  Understand these instructions. Will watch your condition. Will get help right away if you are not doing well or get worse. Document Released: 03/05/2005 Document Revised: 05/28/2011 Document Reviewed: 03/20/2011 [facility] Patient Information 2014 [place], [place].    If you have been instructed to have an in-person evaluation today at a local Urgent Care facility, please use the link below. It will take you to a list of all of our available [facility], including address, phone number and hours of operation. Please do not delay care.  [facility]  If you or a family member do not have a primary care provider, use the link below to schedule a visit and establish care. When you choose a [facility] primary care physician or advanced practice provider, you gain a long-term partner in health. Find a Primary Care Provider  Learn more about [facility]'s in-office and virtual care options:  - Get Care Now

## 2021-09-14 NOTE — Progress Notes (Deleted)
NEUROLOGY FOLLOW UP OFFICE NOTE  Jillian Reyes [id number]  Assessment/Plan:   1  Migraine with aura, without status migrainosus, not intractable - worse since recent breast reduction surgery and neck pain. 2 Cervicalgia, myofascial   Migraine prevention:  Qulipta 60mg  daily Migraine rescue:  Ubrelvy 100mg  Tizanidine 2 to 4mg  as needed for neck pain Limit use of pain relievers to no more than 2 days out of week to prevent risk of rebound or medication-overuse headache. Keep headache diary Follow up 6 months.     Subjective:  Jillian Reyes is a 31 year old right-handed female who follows up for migraine.   UPDATE: Intensity:  severe Duration:  several hours with Nurtec Frequency:  1-2 days a month She has a breast reduction a few weeks ago and she has had a migraine once a week followed by a postdrome headache for 2 days.  Reports increased neck pain.  She takes ibuprofen and Tylenol daily to treat post-op pain.     Current NSAIDS/analgesics:  Excedrin Migraine. Diclofenac 75mg  BID (for knee pain) Current triptans:  none Current ergotamine:  none Current anti-emetic:  Zofran ODT 4mg  Current muscle relaxants:  none Current Antihypertensive medications:  none Current Antidepressant medications:  none Current Anticonvulsant medications:  none Current anti-CGRP:  Qulipta 60mg  daily, Ubrelvy 100mg  Current Vitamins/Herbal/Supplements:  MVI Current Antihistamines/Decongestants:  Flonase Other therapy:  none Hormone/birth control:  none   Caffeine:  1 cup coffee daily.  Soda once in a while Diet:  3 bottles of water daily.  Skips meals Exercise:  No Depression:  yes; Anxiety:  Yes.  Sister recently passed away from a drug overdose.  History of Bipolar disorder. Other pain:  no Sleep hygiene:  Poor.  Trouble staying asleep.   HISTORY:  Beginning at age 44 years old, she had headaches.  In 2012 headaches became more severe.  They start in the back of the neck and base  of her skull and radiate to front of head bilaterally.  It is throbbing and associated with nausea, photophobia, phonophobia, rarely vomiting.  Sometimes sees stars or dots.  They usually last 1 to  2 days.  They occur once a month to once every other month.  However, she a dull headache 20 days a month.  Sometimes dull headache may be in temples as well.  Triggers include perfumes and cigarette smell.  Rest helps.  Maxalt ineffective.  Takes Excedrin Migraine daily.   MRI of cervical spine from 1/3/2017was unremarkable.     Past NSAIDS/analgesics:  Advil, Tylenol, naproxen, hydrocodone Past abortive triptans:  sumatriptan tab, Maxalt Past abortive ergotamine:  none Past muscle relaxants:  Flexeril, tizanidine Past anti-emetic:  promethazine Past antihypertensive medications:  propranolol Past antidepressant medications:  Duloxetine (side effects) Past anticonvulsant medications:  Topiramate, Depakote Past anti-CGRP:  Aimovig 140mg  (made her sick), Nurtec Past vitamins/Herbal/Supplements:  none Past antihistamines/decongestants:  none Other past therapies:  Dry needling (helped)     Family history of headache:  Mom (migraines), father (migraines, brain tumor) She has PCOS.  She has irregular periods.      PAST MEDICAL HISTORY: Past Medical History:  Diagnosis Date   Acne    Asthma due to environmental allergies    per pt moderate, prn inhaler,  followed by pcp   Bipolar 1 disorder (HCC)    Chronic back pain    Chronic pelvic pain in female    Dyslipidemia 02/18/2013   Eczema    GAD (generalized anxiety disorder)  GERD (gastroesophageal reflux disease)    History of abnormal cervical Pap smear    History of chlamydia 2015   History of gestational diabetes    Irregular periods/menstrual cycles    Migraines    PCOS (polycystic ovarian syndrome)    Vitamin D deficiency    Wears glasses     MEDICATIONS: Current Outpatient Medications on File Prior to Visit  Medication Sig  Dispense Refill   albuterol (VENTOLIN HFA) 108 (90 Base) MCG/ACT inhaler Inhale 2 puffs into the lungs every 6 (six) hours as needed for wheezing or shortness of breath. 18 g 2   alprazolam (XANAX) 2 MG tablet TAKE 1 TABLET (2 MG TOTAL) BY MOUTH AT BEDTIME AS NEEDED. FOR SLEEP. 30 tablet 1   clobetasol cream (TEMOVATE) 0.05 % APPLY TOPICALLY DAILY AS NEEDED. ECZEMA 60 g 3   DUPIXENT 300 MG/2ML SOPN      EUCRISA 2 % OINT as needed.     fluticasone (FLONASE) 50 MCG/ACT nasal spray Place 2 sprays into both nostrils 2 (two) times daily. 18.2 mL 5   metroNIDAZOLE (METROCREAM) 0.75 % cream Apply topically 2 (two) times daily.     Multiple Vitamin (MULTIVITAMIN ADULT PO) Take by mouth daily.     ondansetron (ZOFRAN-ODT) 8 MG disintegrating tablet Take 1 tablet (8 mg total) by mouth every 8 (eight) hours as needed for nausea or vomiting. 20 tablet 0   tacrolimus (PROTOPIC) 0.1 % ointment Apply topically 2 (two) times daily.     Ubrogepant (UBRELVY) 100 MG TABS Take 1 tablet by mouth as needed (May repeat 1 tablet in 2 hours.  Maximum 2 tablets in 24 hours.). 16 tablet 5   [DISCONTINUED] naproxen (NAPROSYN) 375 MG tablet Take 1 tablet (375 mg total) by mouth 2 (two) times daily with a meal. 360 tablet 0   No current facility-administered medications on file prior to visit.    ALLERGIES: Allergies  Allergen Reactions   Cymbalta [Duloxetine Hcl] Nausea And Vomiting    FAMILY HISTORY: Family History  Problem Relation Age of Onset   Heart disease Other    Diabetes Father 60       type 2   Thyroid disease Father    Brain cancer Father    Cancer Father        tumor of the brain cancer   Migraines Mother    Kidney disease Mother    Colon polyps Mother    Hyperlipidemia Other        great grandparents      Objective:  *** General: No acute distress.  Patient appears ***-groomed.   Head:  Normocephalic/atraumatic Eyes:  Fundi examined but not visualized Neck: supple, no paraspinal  tenderness, full range of motion Heart:  Regular rate and rhythm Lungs:  Clear to auscultation bilaterally Back: No paraspinal tenderness Neurological Exam: alert and oriented to person, place, and time.  Speech fluent and not dysarthric, language intact.  CN II-XII intact. Bulk and tone normal, muscle strength 5/5 throughout.  Sensation to light touch intact.  Deep tendon reflexes 2+ throughout, toes downgoing.  Finger to nose testing intact.  Gait normal, Romberg negative.   Shon Millet, DO  CC: Danise Edge, MD

## 2021-09-15 ENCOUNTER — Ambulatory Visit: Payer: Federal, State, Local not specified - PPO | Admitting: Neurology

## 2021-09-26 ENCOUNTER — Ambulatory Visit: Payer: Federal, State, Local not specified - PPO | Admitting: Plastic Surgery

## 2021-09-26 ENCOUNTER — Encounter: Payer: Self-pay | Admitting: Student

## 2021-09-26 ENCOUNTER — Ambulatory Visit (INDEPENDENT_AMBULATORY_CARE_PROVIDER_SITE_OTHER): Payer: Federal, State, Local not specified - PPO | Admitting: Student

## 2021-09-26 DIAGNOSIS — N62 Hypertrophy of breast: Secondary | ICD-10-CM

## 2021-09-26 NOTE — Progress Notes (Signed)
Patient is a 31 year old female with history of macromastia.  She underwent bilateral breast reduction with Dr. Ulice Bold on 01/05/2021.  Patient most recently underwent excision of left breast skin for improved symmetry on 09/05/2021 with Dr. Ulice Bold in the clinic.  The skin edges were reapproximated with 4-0 Monocryl.  She is 3 weeks post procedure.  She presents today for follow-up.  Today, patient reports she is doing well.  Patient reports she feels her breasts are still slightly asymmetric.  She states that she is interested in pursuing some other options that she discussed with Dr. Ulice Bold for improvement of symmetry, but she states she does not want to pursue it at this time, but is interested for the future.  In terms of the incision site, she patient feels she states that sometimes she has some soreness in the area.  She denies any erythema or drainage from the incision site.  She denies any fevers or chills.  She denies any other issues related to the procedure.  Chaperone present on exam.  On exam, patient is sitting upright in no acute distress.  Incision is healing well.  There is no erythema, swelling or drainage.  There are 2 Monocryl suture knots noted at either end of the incision.  These were cut and removed.  Patient tolerated well.  Discussed with patient that she may put a small amount of Vaseline on the incision daily.  Discussed that she should keep the area clean and dry.  Discussed that she may start using scar creams and 2 to 3 weeks.  Discussed that she should wait another 2 weeks to submerge the incisions.  Discussed with patient to call back if she has any questions or concerns.  Patient to follow-up as needed.

## 2021-10-16 ENCOUNTER — Encounter: Payer: Self-pay | Admitting: Family Medicine

## 2021-10-18 ENCOUNTER — Encounter: Payer: Self-pay | Admitting: Family Medicine

## 2021-10-18 ENCOUNTER — Ambulatory Visit: Payer: Federal, State, Local not specified - PPO | Admitting: Family Medicine

## 2021-10-18 VITALS — BP 110/80 | HR 50 | Temp 98.1°F | Ht 64.0 in | Wt 159.0 lb

## 2021-10-18 DIAGNOSIS — R233 Spontaneous ecchymoses: Secondary | ICD-10-CM

## 2021-10-18 DIAGNOSIS — M542 Cervicalgia: Secondary | ICD-10-CM | POA: Diagnosis not present

## 2021-10-18 MED ORDER — PREDNISONE 20 MG PO TABS: 40.0000 mg | ORAL_TABLET | Freq: Every day | ORAL | 0 refills | Status: AC

## 2021-10-18 NOTE — Progress Notes (Signed)
Chief Complaint  Patient presents with   Rash    2 weeks    Jillian Reyes is a 31 y.o. female here for a skin complaint.  Duration: 2 weeks Location: chest and legs Pruritic? No Painful? No Drainage? No New soaps/lotions/topicals/detergents? No Sick contacts? No Other associated symptoms: no fevers, spreading Therapies tried thus far: none  Past Medical History:  Diagnosis Date   Acne    Asthma due to environmental allergies    per pt moderate, prn inhaler,  followed by pcp   Bipolar 1 disorder (HCC)    Chronic back pain    Chronic pelvic pain in female    Dyslipidemia 02/18/2013   Eczema    GAD (generalized anxiety disorder)    GERD (gastroesophageal reflux disease)    History of abnormal cervical Pap smear    History of chlamydia 2015   History of gestational diabetes    Irregular periods/menstrual cycles    Migraines    PCOS (polycystic ovarian syndrome)    Vitamin D deficiency    Wears glasses     BP 110/80   Pulse (!) 50   Temp 98.1 F (36.7 C) (Oral)   Ht 5\' 4"  (1.626 m)   Wt 159 lb (72.1 kg)   SpO2 98%   BMI 27.29 kg/m  Gen: awake, alert, appearing stated age Lungs: No accessory muscle use Skin: Small reddish-purple lesions measuring 1 to 2 cm in diameter over the anterior portion of her thighs and lower legs.  They do not blanch. No drainage, TTP, fluctuance, excoriation MSK: Soft tissue edema of the left SCM appreciated along with slightly enlarged and tender submandibular gland on the left, mild TTP over the suboccipital triangle bilaterally Psych: Age appropriate judgment and insight  Petechial rash - Plan: CBC, predniSONE (DELTASONE) 20 MG tablet  Neck pain - Plan: predniSONE (DELTASONE) 20 MG tablet  Check CBC.  5-day prednisone burst 40 mg daily.  For the neck, could be related to viral issue.  Prednisone as above, ice, heat, Tylenol, stretches and exercises.  If no improvement, consider biopsy versus follow-up with her dermatologist. F/u  prn. The patient voiced understanding and agreement to the plan.  [place], DO 10/18/21 3:28 PM

## 2021-10-19 ENCOUNTER — Other Ambulatory Visit: Payer: Self-pay | Admitting: Family Medicine

## 2021-10-19 DIAGNOSIS — R233 Spontaneous ecchymoses: Secondary | ICD-10-CM

## 2021-10-19 DIAGNOSIS — M542 Cervicalgia: Secondary | ICD-10-CM

## 2021-10-19 LAB — CBC
HCT: 42.9 % (ref 36.0–46.0)
Hemoglobin: 14.4 g/dL (ref 12.0–15.0)
MCHC: 33.6 g/dL (ref 30.0–36.0)
MCV: 93 fl (ref 78.0–100.0)
Platelets: 269 10*3/uL (ref 150.0–400.0)
RBC: 4.62 Mil/uL (ref 3.87–5.11)
RDW: 13.1 % (ref 11.5–15.5)
WBC: 9.4 10*3/uL (ref 4.0–10.5)

## 2021-11-08 ENCOUNTER — Other Ambulatory Visit: Payer: Self-pay

## 2021-11-08 DIAGNOSIS — E785 Hyperlipidemia, unspecified: Secondary | ICD-10-CM

## 2021-11-08 MED ORDER — ALPRAZOLAM 2 MG PO TABS: 2.0000 mg | ORAL_TABLET | Freq: Every evening | ORAL | 1 refills | Status: DC | PRN

## 2021-11-08 NOTE — Telephone Encounter (Signed)
Requesting: alprazolam 2mg   Contract: None UDS: 06/30/21 Last Visit: 10/18/21 w/ 12/18/21 Next Visit: None Last Refill: 09/05/21 #30 and 1RF  Please Advise

## 2021-11-15 ENCOUNTER — Encounter: Payer: Self-pay | Admitting: Family Medicine

## 2021-11-21 ENCOUNTER — Encounter (HOSPITAL_BASED_OUTPATIENT_CLINIC_OR_DEPARTMENT_OTHER): Payer: Self-pay | Admitting: Obstetrics & Gynecology

## 2021-11-22 ENCOUNTER — Ambulatory Visit (INDEPENDENT_AMBULATORY_CARE_PROVIDER_SITE_OTHER): Payer: Federal, State, Local not specified - PPO | Admitting: *Deleted

## 2021-11-22 ENCOUNTER — Other Ambulatory Visit (HOSPITAL_COMMUNITY)
Admission: RE | Admit: 2021-11-22 | Discharge: 2021-11-22 | Disposition: A | Discharge status: Home / Self Care | Payer: Federal, State, Local not specified - PPO | Source: Ambulatory Visit | Attending: Obstetrics & Gynecology | Admitting: Obstetrics & Gynecology

## 2021-11-22 DIAGNOSIS — N898 Other specified noninflammatory disorders of vagina: Secondary | ICD-10-CM | POA: Insufficient documentation

## 2021-11-22 MED ORDER — METRONIDAZOLE 500 MG PO TABS: 500.0000 mg | ORAL_TABLET | Freq: Two times a day (BID) | ORAL | 0 refills | Status: DC

## 2021-11-22 NOTE — Progress Notes (Signed)
Pt present to office with complaints of vaginal discharge and odor. Denies vaginal itching. Has had BV several times in the past and feels that her symptoms are consistent with this. Pt performed self swab to be sent for testing. Advised that prescription would be sent based off of symptoms and if swab came back with different diagnosis we would notify her of change. Pt verbalized understanding.

## 2021-11-23 LAB — CERVICOVAGINAL ANCILLARY ONLY
Bacterial Vaginitis (gardnerella): NEGATIVE
Candida Glabrata: NEGATIVE
Candida Vaginitis: NEGATIVE
Comment: NEGATIVE
Comment: NEGATIVE
Comment: NEGATIVE

## 2021-11-29 ENCOUNTER — Other Ambulatory Visit (HOSPITAL_COMMUNITY): Payer: Self-pay

## 2021-12-02 ENCOUNTER — Other Ambulatory Visit: Payer: Self-pay | Admitting: Family Medicine

## 2021-12-12 NOTE — Progress Notes (Deleted)
NEUROLOGY FOLLOW UP OFFICE NOTE  Jillian Reyes [id number]  Assessment/Plan:   1  Migraine with aura, without status migrainosus, not intractable - worse since recent breast reduction surgery and neck pain. 2  Cervicalgia, myofascial  Migraine prevention:  Qulipta 60mg  daily Migraine rescue:  Resend Ubrelvy 100mg  -  Tizanidine 2 to 4mg  as needed for neck pain Limit use of pain relievers to no more than 2 days out of week to prevent risk of rebound or medication-overuse headache. Keep headache diary Follow up 6 months.   Subjective:  Jillian Reyes is a 31 year old right-handed female who follows up for migraine.  UPDATE: Intensity:  severe Duration:  several hours with Nurtec Frequency:  1-2 days a month She has a breast reduction a few weeks ago and she has had a migraine once a week followed by a postdrome headache for 2 days.  Reports increased neck pain.  She takes ibuprofen and Tylenol daily to treat post-op pain.    Current NSAIDS/analgesics:  Excedrin Migraine. Diclofenac 75mg  BID (for knee pain) Current triptans:  none Current ergotamine:  none Current anti-emetic:  Zofran ODT 4mg  Current muscle relaxants:  none Current Antihypertensive medications:  none Current Antidepressant medications:  none Current Anticonvulsant medications:  none Current anti-CGRP:  Qulipta 60mg  daily, Nurtec (rescue) Current Vitamins/Herbal/Supplements:  MVI Current Antihistamines/Decongestants:  Flonase Other therapy:  none Hormone/birth control:  none  Caffeine:  1 cup coffee daily.  Soda once in a while Diet:  3 bottles of water daily.  Skips meals Exercise:  No Depression:  yes; Anxiety:  Yes.  Sister recently passed away from a drug overdose.  History of Bipolar disorder. Other pain:  no Sleep hygiene:  Poor.  Trouble staying asleep.  HISTORY:  Beginning at age 23 years old, she had headaches.  In 2012 headaches became more severe.  They start in the back of the neck and  base of her skull and radiate to front of head bilaterally.  It is throbbing and associated with nausea, photophobia, phonophobia, rarely vomiting.  Sometimes sees stars or dots.  They usually last 1 to  2 days.  They occur once a month to once every other month.  However, she a dull headache 20 days a month.  Sometimes dull headache may be in temples as well.  Triggers include perfumes and cigarette smell.  Rest helps.  Maxalt ineffective.  Takes Excedrin Migraine daily.   MRI of cervical spine from 1/3/2017was unremarkable.     Past NSAIDS/analgesics:  Advil, Tylenol, naproxen, hydrocodone Past abortive triptans:  sumatriptan tab, Maxalt Past abortive ergotamine:  none Past muscle relaxants:  Flexeril, tizanidine Past anti-emetic:  promethazine Past antihypertensive medications:  propranolol Past antidepressant medications:  Duloxetine (side effects) Past anticonvulsant medications:  Topiramate, Depakote Past anti-CGRP:  Aimovig 140mg  (made her sick), Ubrelvy (helpful but not formulary) Past vitamins/Herbal/Supplements:  none Past antihistamines/decongestants:  none Other past therapies:  Dry needling (helped)    Family history of headache:  Mom (migraines), father (migraines, brain tumor) She has PCOS.  She has irregular periods.    PAST MEDICAL HISTORY: Past Medical History:  Diagnosis Date   Acne    Asthma due to environmental allergies    per pt moderate, prn inhaler,  followed by pcp   Bipolar 1 disorder ([place])    Chronic back pain    Chronic pelvic pain in female    Dyslipidemia 02/18/2013   Eczema    GAD (generalized anxiety disorder)  GERD (gastroesophageal reflux disease)    History of abnormal cervical Pap smear    History of chlamydia 2015   History of gestational diabetes    Irregular periods/menstrual cycles    Migraines    PCOS (polycystic ovarian syndrome)    Vitamin D deficiency    Wears glasses     MEDICATIONS: Current Outpatient Medications on File  Prior to Visit  Medication Sig Dispense Refill   albuterol (VENTOLIN HFA) 108 (90 Base) MCG/ACT inhaler Inhale 2 puffs into the lungs every 6 (six) hours as needed for wheezing or shortness of breath. 18 g 2   alprazolam (XANAX) 2 MG tablet Take 1 tablet (2 mg total) by mouth at bedtime as needed. for sleep. 30 tablet 1   amoxicillin-clavulanate (AUGMENTIN) 875-125 MG tablet Take 1 tablet by mouth 2 (two) times daily. 20 tablet 0   clobetasol cream (TEMOVATE) 0.05 % APPLY TOPICALLY DAILY AS NEEDED. ECZEMA 60 g 3   DUPIXENT 300 MG/2ML SOPN      EUCRISA 2 % OINT as needed.     fluticasone (FLONASE) 50 MCG/ACT nasal spray Place 2 sprays into both nostrils 2 (two) times daily. 18.2 mL 5   metroNIDAZOLE (FLAGYL) 500 MG tablet Take 1 tablet (500 mg total) by mouth 2 (two) times daily. 14 tablet 0   metroNIDAZOLE (METROCREAM) 0.75 % cream Apply topically 2 (two) times daily.     Multiple Vitamin (MULTIVITAMIN ADULT PO) Take by mouth daily.     ondansetron (ZOFRAN-ODT) 8 MG disintegrating tablet Take 1 tablet (8 mg total) by mouth every 8 (eight) hours as needed for nausea or vomiting. 20 tablet 0   tacrolimus (PROTOPIC) 0.1 % ointment Apply topically 2 (two) times daily.     Ubrogepant (UBRELVY) 100 MG TABS Take 1 tablet by mouth as needed (May repeat 1 tablet in 2 hours.  Maximum 2 tablets in 24 hours.). 16 tablet 5   [DISCONTINUED] naproxen (NAPROSYN) 375 MG tablet Take 1 tablet (375 mg total) by mouth 2 (two) times daily with a meal. 360 tablet 0   No current facility-administered medications on file prior to visit.    ALLERGIES: Allergies  Allergen Reactions   Cymbalta [Duloxetine Hcl] Nausea And Vomiting    FAMILY HISTORY: Family History  Problem Relation Age of Onset   Heart disease Other    Diabetes Father 61       type 2   Thyroid disease Father    Brain cancer Father    Cancer Father        tumor of the brain cancer   Migraines Mother    Kidney disease Mother    Colon polyps  Mother    Hyperlipidemia Other        great grandparents      Objective:  *** General: No acute distress.  Patient appears well-groomed.   Head:  Normocephalic/atraumatic Eyes:  Fundi examined but not visualized Neck: supple, no paraspinal tenderness, full range of motion Heart:  Regular rate and rhythm Neurological Exam: alert and oriented to person, place, and time.  Speech fluent and not dysarthric, language intact.  CN II-XII intact. Bulk and tone normal, muscle strength 5/5 throughout.  Sensation to light touch intact.  Deep tendon reflexes 2+ throughout.  Finger to nose testing intact.  Gait normal, Romberg negative.    [name], DO  CC: Penni Homans, MD

## 2021-12-15 ENCOUNTER — Ambulatory Visit: Payer: Federal, State, Local not specified - PPO | Admitting: Neurology

## 2021-12-21 NOTE — Progress Notes (Deleted)
NEUROLOGY FOLLOW UP OFFICE NOTE  Jillian Reyes [id number]  Assessment/Plan:   1  Migraine with aura, without status migrainosus, not intractable - worse since recent breast reduction surgery and neck pain. 2  Cervicalgia, myofascial  Migraine prevention:  Qulipta 60mg  daily Migraine rescue:  Resend Ubrelvy 100mg  -  Tizanidine 2 to 4mg  as needed for neck pain Limit use of pain relievers to no more than 2 days out of week to prevent risk of rebound or medication-overuse headache. Keep headache diary Follow up 6 months.   Subjective:  Jillian Reyes is a 31 year old right-handed female who follows up for migraine.  UPDATE: Intensity:  severe Duration:  several hours with Nurtec Frequency:  1-2 days a month She has a breast reduction a few weeks ago and she has had a migraine once a week followed by a postdrome headache for 2 days.  Reports increased neck pain.  She takes ibuprofen and Tylenol daily to treat post-op pain.    Current NSAIDS/analgesics:  Excedrin Migraine. Diclofenac 75mg  BID (for knee pain) Current triptans:  none Current ergotamine:  none Current anti-emetic:  Zofran ODT 4mg  Current muscle relaxants:  none Current Antihypertensive medications:  none Current Antidepressant medications:  none Current Anticonvulsant medications:  none Current anti-CGRP:  Qulipta 60mg  daily, Nurtec (rescue) Current Vitamins/Herbal/Supplements:  MVI Current Antihistamines/Decongestants:  Flonase Other therapy:  none Hormone/birth control:  none  Caffeine:  1 cup coffee daily.  Soda once in a while Diet:  3 bottles of water daily.  Skips meals Exercise:  No Depression:  yes; Anxiety:  Yes.  Sister recently passed away from a drug overdose.  History of Bipolar disorder. Other pain:  no Sleep hygiene:  Poor.  Trouble staying asleep.  HISTORY:  Beginning at age 31 years old, she had headaches.  In 2012 headaches became more severe.  They start in the back of the neck and  base of her skull and radiate to front of head bilaterally.  It is throbbing and associated with nausea, photophobia, phonophobia, rarely vomiting.  Sometimes sees stars or dots.  They usually last 1 to  2 days.  They occur once a month to once every other month.  However, she a dull headache 20 days a month.  Sometimes dull headache may be in temples as well.  Triggers include perfumes and cigarette smell.  Rest helps.  Maxalt ineffective.  Takes Excedrin Migraine daily.   MRI of cervical spine from 1/3/2017was unremarkable.     Past NSAIDS/analgesics:  Advil, Tylenol, naproxen, hydrocodone Past abortive triptans:  sumatriptan tab, Maxalt Past abortive ergotamine:  none Past muscle relaxants:  Flexeril, tizanidine Past anti-emetic:  promethazine Past antihypertensive medications:  propranolol Past antidepressant medications:  Duloxetine (side effects) Past anticonvulsant medications:  Topiramate, Depakote Past anti-CGRP:  Aimovig 140mg  (made her sick), Ubrelvy (helpful but not formulary) Past vitamins/Herbal/Supplements:  none Past antihistamines/decongestants:  none Other past therapies:  Dry needling (helped)    Family history of headache:  Mom (migraines), father (migraines, brain tumor) She has PCOS.  She has irregular periods.    PAST MEDICAL HISTORY: Past Medical History:  Diagnosis Date   Acne    Asthma due to environmental allergies    per pt moderate, prn inhaler,  followed by pcp   Bipolar 1 disorder ([place])    Chronic back pain    Chronic pelvic pain in female    Dyslipidemia 02/18/2013   Eczema    GAD (generalized anxiety disorder)  GERD (gastroesophageal reflux disease)    History of abnormal cervical Pap smear    History of chlamydia 2015   History of gestational diabetes    Irregular periods/menstrual cycles    Migraines    PCOS (polycystic ovarian syndrome)    Vitamin D deficiency    Wears glasses     MEDICATIONS: Current Outpatient Medications on File  Prior to Visit  Medication Sig Dispense Refill   albuterol (VENTOLIN HFA) 108 (90 Base) MCG/ACT inhaler Inhale 2 puffs into the lungs every 6 (six) hours as needed for wheezing or shortness of breath. 18 g 2   alprazolam (XANAX) 2 MG tablet Take 1 tablet (2 mg total) by mouth at bedtime as needed. for sleep. 30 tablet 1   amoxicillin-clavulanate (AUGMENTIN) 875-125 MG tablet Take 1 tablet by mouth 2 (two) times daily. 20 tablet 0   clobetasol cream (TEMOVATE) 0.05 % APPLY TOPICALLY DAILY AS NEEDED. ECZEMA 60 g 3   DUPIXENT 300 MG/2ML SOPN      EUCRISA 2 % OINT as needed.     fluticasone (FLONASE) 50 MCG/ACT nasal spray Place 2 sprays into both nostrils 2 (two) times daily. 18.2 mL 5   metroNIDAZOLE (FLAGYL) 500 MG tablet Take 1 tablet (500 mg total) by mouth 2 (two) times daily. 14 tablet 0   metroNIDAZOLE (METROCREAM) 0.75 % cream Apply topically 2 (two) times daily.     Multiple Vitamin (MULTIVITAMIN ADULT PO) Take by mouth daily.     ondansetron (ZOFRAN-ODT) 8 MG disintegrating tablet Take 1 tablet (8 mg total) by mouth every 8 (eight) hours as needed for nausea or vomiting. 20 tablet 0   tacrolimus (PROTOPIC) 0.1 % ointment Apply topically 2 (two) times daily.     Ubrogepant (UBRELVY) 100 MG TABS Take 1 tablet by mouth as needed (May repeat 1 tablet in 2 hours.  Maximum 2 tablets in 24 hours.). 16 tablet 5   [DISCONTINUED] naproxen (NAPROSYN) 375 MG tablet Take 1 tablet (375 mg total) by mouth 2 (two) times daily with a meal. 360 tablet 0   No current facility-administered medications on file prior to visit.    ALLERGIES: Allergies  Allergen Reactions   Cymbalta [Duloxetine Hcl] Nausea And Vomiting    FAMILY HISTORY: Family History  Problem Relation Age of Onset   Heart disease Other    Diabetes Father 64       type 2   Thyroid disease Father    Brain cancer Father    Cancer Father        tumor of the brain cancer   Migraines Mother    Kidney disease Mother    Colon polyps  Mother    Hyperlipidemia Other        great grandparents      Objective:  *** General: No acute distress.  Patient appears well-groomed.   Head:  Normocephalic/atraumatic Eyes:  Fundi examined but not visualized Neck: supple, no paraspinal tenderness, full range of motion Heart:  Regular rate and rhythm Neurological Exam: ***    Shon Millet, DO  CC: Danise Edge, MD

## 2021-12-25 ENCOUNTER — Ambulatory Visit: Payer: Federal, State, Local not specified - PPO | Admitting: Neurology

## 2022-01-01 ENCOUNTER — Telehealth: Payer: Self-pay | Admitting: Neurology

## 2022-01-01 NOTE — Telephone Encounter (Signed)
Advised Patient if the migraine is that bad and lasting this long after  an object hit her head Please go the ED.  Keep appt for the 8th of December for now. After the visit to the ED, Dr.jaffe will review to see if patient needs to be seen earlier then that or send in a Prednisone taper to break the migraine.

## 2022-01-01 NOTE — Telephone Encounter (Signed)
Patient has a appt with Jaffe in Dec and is on the wait list   She sent a message in this morning about  Concussion - a 40lb metal shelf fell on my head 10/10 Major migraine and short term memory loss.   She has had a migraine for the past week now please call she needs to know what to do

## 2022-01-02 ENCOUNTER — Telehealth: Payer: Self-pay

## 2022-01-02 ENCOUNTER — Other Ambulatory Visit: Payer: Self-pay | Admitting: Family Medicine

## 2022-01-02 NOTE — Telephone Encounter (Signed)
Per patient she hasn't been seen by ED. Too expensive right now. Per Patient she hasn't passed out fainted or vomited. Just has a lingering throbbing headache.  Per Dr.Jaffe patient may come in to see him to be evaluated.

## 2022-01-10 NOTE — Progress Notes (Signed)
NEUROLOGY FOLLOW UP OFFICE NOTE  DUJUANA TRAIN [id number]  Assessment/Plan:   Concussion resolved Migraine with aura, without status migrainosus, not intractable     Migraine prevention:  Will try to restart Qulipta 60mg  daily, provided copay card and samples.   Migraine rescue:  Resend Ubrelvy 100mg  - She has failed two triptans and Nurtec.  Roselyn Meier has been effective. Limit use of pain relievers to no more than 2 days out of week to prevent risk of rebound or medication-overuse headache. Keep headache diary Follow up 4-5 months.     Subjective:  Armonie A. Cantarero is a 31 year old right-handed female who follows up for migraine.   UPDATE: She hasn't been able to get Ubrelvy or [place] approved, so she has been out for a year.   Intensity:  severe Duration:  several hours with Tylenol Frequency:  4 days a month  3 weeks ago, a 40 lb shelf fell on her head.  For about a week and a half, she had persistent migraine as well as short term memory problems.  It has since resolved.    Current NSAIDS/analgesics:  Excedrin Migraine. Diclofenac 75mg  BID (for knee pain) Current triptans:  none Current ergotamine:  none Current anti-emetic:  Zofran ODT 4mg  Current muscle relaxants:  none Current Antihypertensive medications:  none Current Antidepressant medications:  none Current Anticonvulsant medications:  none Current anti-CGRP:  Qulipta 60mg  daily, Nurtec (rescue) Current Vitamins/Herbal/Supplements:  MVI Current Antihistamines/Decongestants:  Flonase Other therapy:  none Hormone/birth control:  none   Caffeine:  1 cup coffee daily.  Soda once in a while Diet:  3 bottles of water daily.  Skips meals Exercise:  No Depression:  yes; Anxiety:  Yes.  Sister recently passed away from a drug overdose.  History of Bipolar disorder. Other pain:  no Sleep hygiene:  Poor.  Trouble staying asleep.   HISTORY:  Beginning at age 31 years old, she had headaches.  In 2012 headaches  became more severe.  They start in the back of the neck and base of her skull and radiate to front of head bilaterally.  It is throbbing and associated with nausea, photophobia, phonophobia, rarely vomiting.  Sometimes sees stars or dots.  They usually last 1 to  2 days.  They occur once a month to once every other month.  However, she a dull headache 20 days a month.  Sometimes dull headache may be in temples as well.  Triggers include perfumes and cigarette smell.  Rest helps.  Maxalt ineffective.  Takes Excedrin Migraine daily.   MRI of cervical spine from 1/3/2017was unremarkable.     Past NSAIDS/analgesics:  Advil, Tylenol, naproxen, hydrocodone Past abortive triptans:  sumatriptan tab, Maxalt Past abortive ergotamine:  none Past muscle relaxants:  Flexeril, tizanidine Past anti-emetic:  promethazine Past antihypertensive medications:  propranolol Past antidepressant medications:  Duloxetine (side effects) Past anticonvulsant medications:  Topiramate, Depakote Past anti-CGRP:  Aimovig 140mg  (made her sick), Ubrelvy (helpful but not formulary) Past vitamins/Herbal/Supplements:  none Past antihistamines/decongestants:  none Other past therapies:  Dry needling (helped)     Family history of headache:  Mom (migraines), father (migraines, brain tumor) She has PCOS.  She has irregular periods.    PAST MEDICAL HISTORY: Past Medical History:  Diagnosis Date   Acne    Asthma due to environmental allergies    per pt moderate, prn inhaler,  followed by pcp   Bipolar 1 disorder ([place])    Chronic back pain    Chronic  pelvic pain in female    Dyslipidemia 02/18/2013   Eczema    GAD (generalized anxiety disorder)    GERD (gastroesophageal reflux disease)    History of abnormal cervical Pap smear    History of chlamydia 2015   History of gestational diabetes    Irregular periods/menstrual cycles    Migraines    PCOS (polycystic ovarian syndrome)    Vitamin D deficiency    Wears glasses      MEDICATIONS: Current Outpatient Medications on File Prior to Visit  Medication Sig Dispense Refill   albuterol (VENTOLIN HFA) 108 (90 Base) MCG/ACT inhaler Inhale 2 puffs into the lungs every 6 (six) hours as needed for wheezing or shortness of breath. 18 g 2   alprazolam (XANAX) 2 MG tablet Take 1 tablet (2 mg total) by mouth at bedtime as needed. for sleep. 30 tablet 1   amoxicillin-clavulanate (AUGMENTIN) 875-125 MG tablet Take 1 tablet by mouth 2 (two) times daily. 20 tablet 0   clobetasol cream (TEMOVATE) 0.05 % APPLY TOPICALLY DAILY AS NEEDED. ECZEMA 60 g 3   DUPIXENT 300 MG/2ML SOPN      EUCRISA 2 % OINT as needed.     fluticasone (FLONASE) 50 MCG/ACT nasal spray Place 2 sprays into both nostrils 2 (two) times daily. 18.2 mL 5   lamoTRIgine (LAMICTAL) 200 MG tablet Take 1 tablet (200 mg total) by mouth at bedtime. 90 tablet 0   metroNIDAZOLE (FLAGYL) 500 MG tablet Take 1 tablet (500 mg total) by mouth 2 (two) times daily. 14 tablet 0   metroNIDAZOLE (METROCREAM) 0.75 % cream Apply topically 2 (two) times daily.     Multiple Vitamin (MULTIVITAMIN ADULT PO) Take by mouth daily.     ondansetron (ZOFRAN-ODT) 8 MG disintegrating tablet Take 1 tablet (8 mg total) by mouth every 8 (eight) hours as needed for nausea or vomiting. 20 tablet 0   tacrolimus (PROTOPIC) 0.1 % ointment Apply topically 2 (two) times daily.     Ubrogepant (UBRELVY) 100 MG TABS Take 1 tablet by mouth as needed (May repeat 1 tablet in 2 hours.  Maximum 2 tablets in 24 hours.). 16 tablet 5   [DISCONTINUED] naproxen (NAPROSYN) 375 MG tablet Take 1 tablet (375 mg total) by mouth 2 (two) times daily with a meal. 360 tablet 0   No current facility-administered medications on file prior to visit.    ALLERGIES: Allergies  Allergen Reactions   Cymbalta [Duloxetine Hcl] Nausea And Vomiting    FAMILY HISTORY: Family History  Problem Relation Age of Onset   Heart disease Other    Diabetes Father 76       type 2    Thyroid disease Father    Brain cancer Father    Cancer Father        tumor of the brain cancer   Migraines Mother    Kidney disease Mother    Colon polyps Mother    Hyperlipidemia Other        great grandparents      Objective:  Blood pressure 105/70, pulse 84, resp. rate 18, height 5\' 4"  (1.626 m), weight 160 lb (72.6 kg), SpO2 98 %. General: No acute distress.  Patient appears well-groomed.   Head:  Normocephalic/atraumatic Eyes:  Fundi examined but not visualized Neck: supple, no paraspinal tenderness, full range of motion Heart:  Regular rate and rhythm Lungs:  Clear to auscultation bilaterally Back: No paraspinal tenderness Neurological Exam: alert and oriented to person, place, and time.  Speech fluent  and not dysarthric, language intact.  CN II-XII intact. Bulk and tone normal, muscle strength 5/5 throughout.  Sensation to light touch intact.  Deep tendon reflexes 2+ throughout, toes downgoing.  Finger to nose testing intact.  Gait normal, Romberg negative.   [name], DO  CC: Penni Homans, MD

## 2022-01-15 ENCOUNTER — Encounter: Payer: Self-pay | Admitting: Neurology

## 2022-01-15 ENCOUNTER — Ambulatory Visit: Payer: Federal, State, Local not specified - PPO | Admitting: Neurology

## 2022-01-15 ENCOUNTER — Telehealth: Payer: Self-pay

## 2022-01-15 ENCOUNTER — Other Ambulatory Visit: Payer: Self-pay | Admitting: Neurology

## 2022-01-15 VITALS — BP 105/70 | HR 84 | Resp 18 | Ht 64.0 in | Wt 160.0 lb

## 2022-01-15 DIAGNOSIS — G43109 Migraine with aura, not intractable, without status migrainosus: Secondary | ICD-10-CM

## 2022-01-15 MED ORDER — UBRELVY 100 MG PO TABS: 1.0000 | ORAL_TABLET | ORAL | 11 refills | Status: DC | PRN

## 2022-01-15 MED ORDER — QULIPTA 60 MG PO TABS: 60.0000 mg | ORAL_TABLET | Freq: Every day | ORAL | 11 refills | Status: DC

## 2022-01-15 NOTE — Progress Notes (Signed)
Medication Samples have been provided to the patient.  Drug name: Ladean Raya       Strength: 60 mg        Qty: 8  LOT: [id number]  Exp.Date: 01/2022  Dosing instructions: Daily  The patient has been instructed regarding the correct time, dose, and frequency of taking this medication, including desired effects and most common side effects.   Venetia Night 2:38 PM 01/15/2022    Medication Samples have been provided to the patient.  Drug name: Tiffany Kocher       Strength: 100 mg        Qty: 4  LOT: [id number]  Exp.Date: 11/2023  Dosing instructions: As needed  The patient has been instructed regarding the correct time, dose, and frequency of taking this medication, including desired effects and most common side effects.   Venetia Night 2:38 PM 01/15/2022

## 2022-01-15 NOTE — Patient Instructions (Signed)
Qulipta 60mg  daily Ubrelvy as needed Follow up 4-5 months.

## 2022-01-15 NOTE — Telephone Encounter (Signed)
Patient seen in office today.  Per patient message received from Pharmacy PA needed for [place].

## 2022-01-16 ENCOUNTER — Other Ambulatory Visit (HOSPITAL_COMMUNITY): Payer: Self-pay

## 2022-01-16 ENCOUNTER — Telehealth: Payer: Self-pay | Admitting: Pharmacy Technician

## 2022-01-16 NOTE — Telephone Encounter (Signed)
Patient Advocate Encounter   Received notification that prior authorization for Qulipta 60MG  tablets is required.   PA submitted on 01/16/2022 Key [id number] Status is pending       Lyndel Safe, [place] Patient Advocate Specialist [place] Patient Advocate Team Direct Number: [phone]  Fax: [phone]

## 2022-01-17 NOTE — Telephone Encounter (Signed)
Patient Advocate Encounter  Prior Authorization for Qulipta 60MG  tablets  has been approved.    PA# [id number] Effective dates: 01/16/2022 through 01/16/2023      Lyndel Safe, [place] Patient Advocate Specialist [place] Patient Advocate Team Direct Number: [phone]  Fax: [phone]

## 2022-01-28 ENCOUNTER — Other Ambulatory Visit: Payer: Self-pay | Admitting: Neurology

## 2022-01-30 ENCOUNTER — Other Ambulatory Visit (HOSPITAL_COMMUNITY): Payer: Self-pay

## 2022-02-01 ENCOUNTER — Telehealth: Payer: Self-pay | Admitting: Pharmacy Technician

## 2022-02-01 ENCOUNTER — Other Ambulatory Visit (HOSPITAL_COMMUNITY): Payer: Self-pay

## 2022-02-01 NOTE — Telephone Encounter (Signed)
Pharmacy Patient Advocate Encounter   Received notification from Office that prior authorization for Jillian Reyes is required/requested.   PA submitted on 02/01/2022 to [employer] via CoverMyMeds Key [id number] Status is pending

## 2022-02-05 ENCOUNTER — Other Ambulatory Visit (HOSPITAL_COMMUNITY): Payer: Self-pay

## 2022-02-05 NOTE — Telephone Encounter (Signed)
Faxed Formulary exception form to [facility] [phone] Also sent office notes from 01/15/22 and 01/26/21

## 2022-02-14 ENCOUNTER — Other Ambulatory Visit (HOSPITAL_COMMUNITY): Payer: Self-pay

## 2022-02-16 ENCOUNTER — Encounter: Payer: Self-pay | Admitting: Neurology

## 2022-02-23 ENCOUNTER — Ambulatory Visit: Payer: Federal, State, Local not specified - PPO | Admitting: Neurology

## 2022-02-26 ENCOUNTER — Other Ambulatory Visit (HOSPITAL_COMMUNITY): Payer: Self-pay

## 2022-02-26 NOTE — Telephone Encounter (Signed)
Patient advised of approval.  

## 2022-02-26 NOTE — Telephone Encounter (Signed)
Patient Advocate Encounter  Prior Authorization for Bernita Raisin 100mg  tablets has been approved.    Effective: 02-26-2022 to 09-14-2022  09-16-2022 approved is 10 tabs n 30 days  Determination letter attached to patient chart

## 2022-03-06 ENCOUNTER — Other Ambulatory Visit: Payer: Self-pay | Admitting: Family Medicine

## 2022-03-06 DIAGNOSIS — E785 Hyperlipidemia, unspecified: Secondary | ICD-10-CM

## 2022-03-06 NOTE — Telephone Encounter (Signed)
Requesting:alprazolam 2mg   Contract: 05/31/2016 UDS: 06/30/21 Last Visit: 10/18/21 w/ 12/18/21 Next Visit: None Last Refill: 11/08/21 #30 and 1RF   Please Advise

## 2022-03-08 ENCOUNTER — Encounter: Payer: Self-pay | Admitting: *Deleted

## 2022-03-08 NOTE — Telephone Encounter (Signed)
Mychart message sent for patient to schedule appointment.

## 2022-03-14 ENCOUNTER — Ambulatory Visit: Payer: Federal, State, Local not specified - PPO | Admitting: Family Medicine

## 2022-03-31 ENCOUNTER — Other Ambulatory Visit: Payer: Self-pay | Admitting: Family Medicine

## 2022-04-24 ENCOUNTER — Encounter: Payer: Self-pay | Admitting: Neurology

## 2022-04-25 ENCOUNTER — Ambulatory Visit (INDEPENDENT_AMBULATORY_CARE_PROVIDER_SITE_OTHER): Payer: Federal, State, Local not specified - PPO

## 2022-04-25 DIAGNOSIS — G43109 Migraine with aura, not intractable, without status migrainosus: Secondary | ICD-10-CM | POA: Diagnosis not present

## 2022-04-25 MED ORDER — KETOROLAC TROMETHAMINE 60 MG/2ML IM SOLN
60.0000 mg | Freq: Once | INTRAMUSCULAR | Status: AC
  Administered 2022-04-25: 60 mg via INTRAMUSCULAR

## 2022-04-25 MED ORDER — DIPHENHYDRAMINE HCL 50 MG/ML IJ SOLN
50.0000 mg | Freq: Once | INTRAMUSCULAR | Status: AC
  Administered 2022-04-25: 25 mg via INTRAMUSCULAR

## 2022-04-25 MED ORDER — METOCLOPRAMIDE HCL 5 MG/ML IJ SOLN
10.0000 mg | Freq: Once | INTRAMUSCULAR | Status: AC
  Administered 2022-04-25: 10 mg via INTRAMUSCULAR

## 2022-04-26 ENCOUNTER — Telehealth: Payer: Self-pay

## 2022-04-26 DIAGNOSIS — R0683 Snoring: Secondary | ICD-10-CM

## 2022-05-09 ENCOUNTER — Ambulatory Visit (INDEPENDENT_AMBULATORY_CARE_PROVIDER_SITE_OTHER): Payer: Federal, State, Local not specified - PPO | Admitting: Acute Care

## 2022-05-09 ENCOUNTER — Encounter: Payer: Self-pay | Admitting: Acute Care

## 2022-05-09 VITALS — BP 98/60 | HR 77 | Temp 98.0°F | Ht 64.0 in | Wt 167.8 lb

## 2022-05-09 DIAGNOSIS — R0683 Snoring: Secondary | ICD-10-CM

## 2022-05-09 NOTE — Progress Notes (Signed)
History of Present Illness Jillian Reyes is a 32 y.o. female with suspected sleep disorder breathing.She has a history of asthma, chroic headaches, and breast surgery in 12/2020.  She was referred for sleep evaluation by Jillian Clines, DO.   05/09/2022 Pt. Presents for snoring and waking herself up gasping . She has also has witnessed sleep apnea, pauses in her breathing by her husband. She has teeth grinding. She has had terrible migraines the last month. She does have migraines at baseline, they have been much worse the last several months.  She has had weight loss and weight gain. Overall her weight is between 20-30 pounds below what she was 2 years ago. She is always tired.  She works at [employer] 5am-2 pm M-F. She is an operational support person. The consistent hours are new. She does recognize that she needs a better sleep environment.  Sister died of OD in 06-Jun-2019. The anniversary date of her death is always especially hard. She lives at home with her husband and 2 teenage children.  She has not been exposed to TB. She has no allergies. She has taken prednisone in the past. Last dose was 8-12 months ago.   Test Results: Sleep questionnaire Symptoms-  Snoring , waking up gasping for air, restless legs, constant headaches when waking up.  Prior sleep study-  No Bedtime- 9 pm-10:30 pm Time to fall asleep- 1-1.5 hours Nocturnal awakenings- 1 Out of bed/start of day- 0300  Weight changes- down 20-30 pounds Do you operate heavy machinery- No Do you currently wear CPAP- No Do you current wear oxygen- No Epworth-  11       05/09/2022    3:00 PM  Results of the Epworth flowsheet  Sitting and reading 2  Watching TV 2  Sitting, inactive in a public place (e.g. a theatre or a meeting) 1  As a passenger in a car for an hour without a break 2  Lying down to rest in the afternoon when circumstances permit 3  Sitting and talking to someone 0  Sitting quietly after a lunch without  alcohol 1  In a car, while stopped for a few minutes in traffic 0  Total score 11          Latest Ref Rng & Units 10/18/2021    3:13 PM 07/06/2020    9:38 AM 06/21/2020    1:55 PM  CBC  WBC 4.0 - 10.5 K/uL 9.4  6.5  8.0   Hemoglobin 12.0 - 15.0 g/dL 14.4  14.6  13.8   Hematocrit 36.0 - 46.0 % 42.9  42.9  40.4   Platelets 150.0 - 400.0 K/uL 269.0  255  253        Latest Ref Rng & Units 06/21/2020    1:55 PM 12/17/2018   10:53 AM 01/14/2018    4:05 PM  BMP  Glucose 70 - 99 mg/dL 84  89  96   BUN 6 - 20 mg/dL '8  10  13   '$ Creatinine 0.44 - 1.00 mg/dL 0.78  0.84  0.92   Sodium 135 - 145 mmol/L 138  139  140   Potassium 3.5 - 5.1 mmol/L 3.6  4.2  4.0   Chloride 98 - 111 mmol/L 105  106  106   CO2 22 - 32 mmol/L '25  25  26   '$ Calcium 8.9 - 10.3 mg/dL 8.8  9.5  9.3     BNP No results found for: "BNP"  ProBNP No  results found for: "PROBNP"  PFT No results found for: "FEV1PRE", "FEV1POST", "FVCPRE", "FVCPOST", "TLC", "DLCOUNC", "PREFEV1FVCRT", "PSTFEV1FVCRT"  No results found.   Past medical hx Past Medical History:  Diagnosis Date   Acne    Asthma due to environmental allergies    per pt moderate, prn inhaler,  followed by pcp   Bipolar 1 disorder ([place])    Chronic back pain    Chronic pelvic pain in female    Dyslipidemia 02/18/2013   Eczema    GAD (generalized anxiety disorder)    GERD (gastroesophageal reflux disease)    History of abnormal cervical Pap smear    History of chlamydia 2015   History of gestational diabetes    Irregular periods/menstrual cycles    Migraines    PCOS (polycystic ovarian syndrome)    Vitamin D deficiency    Wears glasses      Social History   Tobacco Use   Smoking status: Former    Years: 13.00    Types: Cigarettes    Quit date: 06/29/2018    Years since quitting: 3.8   Smokeless tobacco: Never  Vaping Use   Vaping Use: Some days  Substance Use Topics   Alcohol use: Not Currently   Drug use: Never    [name] reports  that she quit smoking about 3 years ago. Her smoking use included cigarettes. She has never used smokeless tobacco. She reports that she does not currently use alcohol. She reports that she does not use drugs.  Tobacco Cessation: Former smoker , Quit 2020 with a 13 pack year smoking history   Past surgical hx, Family hx, Social hx all reviewed.  Current Outpatient Medications on File Prior to Visit  Medication Sig   albuterol (VENTOLIN HFA) 108 (90 Base) MCG/ACT inhaler Inhale 2 puffs into the lungs every 6 (six) hours as needed for wheezing or shortness of breath.   alprazolam (XANAX) 2 MG tablet TAKE 1 TABLET (2 MG TOTAL) BY MOUTH AT BEDTIME AS NEEDED. FOR SLEEP.   Atogepant (QULIPTA) 60 MG TABS Take 60 mg by mouth daily.   clobetasol cream (TEMOVATE) 0.05 % APPLY TOPICALLY DAILY AS NEEDED. ECZEMA   DUPIXENT 300 MG/2ML SOPN    EUCRISA 2 % OINT as needed.   lamoTRIgine (LAMICTAL) 200 MG tablet TAKE 1 TABLET BY MOUTH AT BEDTIME.   metroNIDAZOLE (METROCREAM) 0.75 % cream Apply topically 2 (two) times daily.   Multiple Vitamin (MULTIVITAMIN ADULT PO) Take by mouth daily.   ondansetron (ZOFRAN-ODT) 8 MG disintegrating tablet Take 1 tablet (8 mg total) by mouth every 8 (eight) hours as needed for nausea or vomiting.   spironolactone (ALDACTONE) 50 MG tablet Take 50 mg by mouth daily. 1 daily, 2 at HS   tacrolimus (PROTOPIC) 0.1 % ointment Apply topically 2 (two) times daily.   Ubrogepant (UBRELVY) 100 MG TABS Take 1 tablet by mouth as needed. May repeat after 2 hours.  Maximum 2 tablets in 24 hours.   rizatriptan (MAXALT) 10 MG tablet Take 10 mg by mouth as needed.   [DISCONTINUED] naproxen (NAPROSYN) 375 MG tablet Take 1 tablet (375 mg total) by mouth 2 (two) times daily with a meal.   No current facility-administered medications on file prior to visit.     Allergies  Allergen Reactions   Cymbalta [Duloxetine Hcl] Nausea And Vomiting    Review Of Systems:  Constitutional:   No   weight loss, night sweats,  Fevers, chills, fatigue, or  lassitude.  HEENT:   No  headaches,  Difficulty swallowing,  Tooth/dental problems, or  Sore throat,                No sneezing, itching, ear ache, nasal congestion, post nasal drip,   CV:  No chest pain,  Orthopnea, PND, swelling in lower extremities, anasarca, dizziness, palpitations, syncope.   GI  No heartburn, indigestion, abdominal pain, nausea, vomiting, diarrhea, change in bowel habits, loss of appetite, bloody stools.   Resp: No shortness of breath with exertion or at rest.  No excess mucus, no productive cough,  No non-productive cough,  No coughing up of blood.  No change in color of mucus.  No wheezing.  No chest wall deformity  Skin: no rash or lesions.  GU: no dysuria, change in color of urine, no urgency or frequency.  No flank pain, no hematuria   MS:  No joint pain or swelling.  No decreased range of motion.  No back pain.  Psych:  No change in mood or affect. No depression or anxiety.  No memory loss.   Vital Signs BP 98/60 (BP Location: Left Arm, Patient Position: Sitting, Cuff Size: Normal)   Pulse 77   Temp 98 F (36.7 C) (Oral)   Ht '5\' 4"'$  (1.626 m)   Wt 167 lb 12.8 oz (76.1 kg)   SpO2 99%   BMI 28.80 kg/m    Physical Exam:  General- No distress,  A&O x 3, pleasant ENT: No sinus tenderness, TM clear, pale nasal mucosa, no oral exudate,no post nasal drip, no LAN Cardiac: S1, S2, regular rate and rhythm, no murmur Chest: No wheeze/ rales/ dullness; no accessory muscle use, no nasal flaring, no sternal retractions Abd.: Soft Non-tender, ND, BS +, Body mass index is 28.8 kg/m.  Ext: No clubbing cyanosis, edema Neuro:  normal strength, MAE x 4, A&O x 3 Skin: No rashes, warm and dry, No lesions  Psych: normal mood and behavior   Assessment/Plan Suspected sleep apnea / snoring  AM headaches Wakes up gasping for air Plan We will order  a home sleep study. We will schedule follow up to review the  results with you.  Remember to establish a good bedtime routine, and work on sleep hygiene.  Limit daytime naps , avoid stimulants such as caffeine and nicotine close to bedtime, exercise daily to promote sleep quality, avoid heavy , spicy, fried , or rich foods before bed. Ensure adequate exposure to natural light during the day,establish a relaxing bedtime routine with a pleasant sleep environment ( Bedroom between 60 and 67 degrees, turn off bright lights , TV or device screens  , consider black out curtains or white noise machines) Do not drive if sleepy. Follow up with  Sleep MD or Dakia Schifano NP once  sleep study has been completed and read.  Call if you need [place] sooner.  Please contact office for sooner follow up if symptoms do not improve or worsen or seek emergency care    I spent 25 minutes dedicated to the care of this patient on the date of this encounter to include pre-visit review of records, face-to-face time with the patient discussing conditions above, post visit ordering of testing, clinical documentation with the electronic health record, making appropriate referrals as documented, and communicating necessary information to the patient's healthcare team.   Magdalen Spatz, NP 05/09/2022  3:15 PM

## 2022-05-09 NOTE — Patient Instructions (Addendum)
It is good to see you today. We will order  a home sleep study. We will schedule follow up to review the results with you.  Remember to establish a good bedtime routine, and work on sleep hygiene.  Limit daytime naps , avoid stimulants such as caffeine and nicotine close to bedtime, exercise daily to promote sleep quality, avoid heavy , spicy, fried , or rich foods before bed. Ensure adequate exposure to natural light during the day,establish a relaxing bedtime routine with a pleasant sleep environment ( Bedroom between 60 and 67 degrees, turn off bright lights , TV or device screens  , consider black out curtains or white noise machines) Do not drive if sleepy. Follow up with  Sleep MD or Astin Rape NP once  sleep study has been completed and read.  Call if you need [place] sooner.  Please contact office for sooner follow up if symptoms do not improve or worsen or seek emergency care

## 2022-05-10 ENCOUNTER — Ambulatory Visit: Payer: Federal, State, Local not specified - PPO

## 2022-05-10 DIAGNOSIS — R0683 Snoring: Secondary | ICD-10-CM

## 2022-05-10 DIAGNOSIS — G4733 Obstructive sleep apnea (adult) (pediatric): Secondary | ICD-10-CM

## 2022-05-15 ENCOUNTER — Other Ambulatory Visit: Payer: Self-pay

## 2022-05-15 ENCOUNTER — Encounter: Payer: Self-pay | Admitting: Acute Care

## 2022-05-15 NOTE — Progress Notes (Signed)
Reviewed and agree with assessment/plan.   Chesley Mires, MD [facility] Pulmonary/Critical Care 05/15/2022, 10:26 AM Pager:  [phone]

## 2022-05-25 ENCOUNTER — Telehealth: Payer: Self-pay | Admitting: Primary Care

## 2022-05-25 ENCOUNTER — Ambulatory Visit: Payer: Federal, State, Local not specified - PPO | Admitting: Primary Care

## 2022-05-25 DIAGNOSIS — G4733 Obstructive sleep apnea (adult) (pediatric): Secondary | ICD-10-CM

## 2022-05-25 NOTE — Telephone Encounter (Signed)
Pt states she is returning missed call from [place], pls call [phone]

## 2022-05-25 NOTE — Telephone Encounter (Signed)
This would have been put in Dr Janee Morn look at to be read

## 2022-05-25 NOTE — Telephone Encounter (Signed)
Dr. Annamaria Boots can you advise on patients sleep study results?

## 2022-05-25 NOTE — Telephone Encounter (Signed)
Called and spoke with pt. Showing that her f/u appt was cancelled due to no sleep study results being available yet. Pt said she believes she did the sleep study 2/23.  PCCs, please advise.

## 2022-05-25 NOTE — Telephone Encounter (Signed)
I am reading that study now. It shows very mild apnea. The formal report will go to Eric Form, NP, who ordered it. She will disccuss what to do.

## 2022-06-01 NOTE — Telephone Encounter (Signed)
Judson Roch can you please advise on patients sleep study results

## 2022-06-01 NOTE — Telephone Encounter (Signed)
Judson Roch just an [employer] over sleep study results with patient. She doesn't feel like results are accurate. She states she took the nasal cannula out of her nose but still had the sleep monitor on. She states she is currently at work, but will call back Monday to schedule a f/u

## 2022-06-22 ENCOUNTER — Ambulatory Visit (INDEPENDENT_AMBULATORY_CARE_PROVIDER_SITE_OTHER): Payer: Federal, State, Local not specified - PPO | Admitting: Obstetrics & Gynecology

## 2022-06-22 ENCOUNTER — Encounter (HOSPITAL_BASED_OUTPATIENT_CLINIC_OR_DEPARTMENT_OTHER): Payer: Self-pay | Admitting: Obstetrics & Gynecology

## 2022-06-22 VITALS — BP 95/63 | HR 62 | Ht 64.0 in | Wt 163.4 lb

## 2022-06-22 DIAGNOSIS — E282 Polycystic ovarian syndrome: Secondary | ICD-10-CM | POA: Diagnosis not present

## 2022-06-22 DIAGNOSIS — Z872 Personal history of diseases of the skin and subcutaneous tissue: Secondary | ICD-10-CM

## 2022-06-22 DIAGNOSIS — Z01419 Encounter for gynecological examination (general) (routine) without abnormal findings: Secondary | ICD-10-CM | POA: Diagnosis not present

## 2022-06-22 DIAGNOSIS — Z9889 Other specified postprocedural states: Secondary | ICD-10-CM

## 2022-06-22 MED ORDER — METFORMIN HCL 500 MG PO TABS: 500.0000 mg | ORAL_TABLET | Freq: Every day | ORAL | 3 refills | Status: DC

## 2022-06-22 NOTE — Addendum Note (Signed)
Addended by: Jerene Bears on: 06/22/2022 08:50 AM   Modules accepted: Orders

## 2022-06-22 NOTE — Progress Notes (Addendum)
32 y.o. G55P1011 Married Other or two or more races female here for annual exam.  H/o endometrial ablation.  Does not have any bleeding since her ablation.  Would like to restart metformin.  Did well with the 500mg  dosage.  No LMP recorded. Patient has had an ablation.          Sexually active: Yes.    The current method of family planning is vasectomy.    Smoker:  former smoker  Health Maintenance: Pap:  06/14/2021 Negative History of abnormal Pap:  Remote hx MMG:  guidelines reviewed Colonoscopy:  guidelines reviewed Screening Labs: does with Dr. Rogelia Rohrer   reports that she quit smoking about 3 years ago. Her smoking use included cigarettes. She has never used smokeless tobacco. She reports that she does not currently use alcohol. She reports that she does not use drugs.  Past Medical History:  Diagnosis Date   Acne    Asthma due to environmental allergies    per pt moderate, prn inhaler,  followed by pcp   Bipolar 1 disorder    Chronic back pain    Chronic pelvic pain in female    Dyslipidemia 02/18/2013   Eczema    GAD (generalized anxiety disorder)    GERD (gastroesophageal reflux disease)    History of abnormal cervical Pap smear    History of chlamydia 2015   History of gestational diabetes    Irregular periods/menstrual cycles    Migraines    PCOS (polycystic ovarian syndrome)    Vitamin D deficiency    Wears glasses     Past Surgical History:  Procedure Laterality Date   BREAST REDUCTION SURGERY Bilateral 01/05/2021   Procedure: BREAST REDUCTION WITH LIPOSUCTION;  Surgeon: Peggye Form, DO;  Location: [facility];  Service: Plastics;  Laterality: Bilateral;   HYSTEROSCOPY WITH NOVASURE N/A 07/06/2020   Procedure: HYSTEROSCOPY WITH NOVASURE ABLATION, DILITATION AND CURETTAGE;  Surgeon: Jerene Bears, MD;  Location: [facility] [place];  Service: Gynecology;  Laterality: N/A;   INCISION AND DRAINAGE Right 01/12/2019   Procedure: INCISION  AND DRAINAGE OF LESION;  Surgeon: Jerene Bears, MD;  Location: [facility] [place];  Service: Gynecology;  Laterality: Right;   KNEE ARTHROSCOPY Right 04-16-2019  @[facility]    LAPAROSCOPY N/A 01/12/2019   Procedure: LAPAROSCOPY DIAGNOSTIC; LYSIS OF ADHESIONS;  Surgeon: Jerene Bears, MD;  Location: [facility] [place];  Service: Gynecology;  Laterality: N/A;  possible treatement of endometriosis   WISDOM TOOTH EXTRACTION  2016    Current Outpatient Medications  Medication Sig Dispense Refill   albuterol (VENTOLIN HFA) 108 (90 Base) MCG/ACT inhaler Inhale 2 puffs into the lungs every 6 (six) hours as needed for wheezing or shortness of breath. 18 g 2   alprazolam (XANAX) 2 MG tablet TAKE 1 TABLET (2 MG TOTAL) BY MOUTH AT BEDTIME AS NEEDED. FOR SLEEP. 30 tablet 1   Atogepant (QULIPTA) 60 MG TABS Take 60 mg by mouth daily. 30 tablet 11   clobetasol cream (TEMOVATE) 0.05 % APPLY TOPICALLY DAILY AS NEEDED. ECZEMA 60 g 3   DUPIXENT 300 MG/2ML SOPN      EUCRISA 2 % OINT as needed.     lamoTRIgine (LAMICTAL) 200 MG tablet TAKE 1 TABLET BY MOUTH AT BEDTIME. 90 tablet 0   metroNIDAZOLE (METROCREAM) 0.75 % cream Apply topically 2 (two) times daily.     Multiple Vitamin (MULTIVITAMIN ADULT PO) Take by mouth daily.     ondansetron (ZOFRAN-ODT) 8 MG disintegrating  tablet Take 1 tablet (8 mg total) by mouth every 8 (eight) hours as needed for nausea or vomiting. 20 tablet 0   spironolactone (ALDACTONE) 50 MG tablet Take 50 mg by mouth daily. 1 daily, 2 at HS     tacrolimus (PROTOPIC) 0.1 % ointment Apply topically 2 (two) times daily.     Ubrogepant (UBRELVY) 100 MG TABS Take 1 tablet by mouth as needed. May repeat after 2 hours.  Maximum 2 tablets in 24 hours. 10 tablet 11   No current facility-administered medications for this visit.    Family History  Problem Relation Age of Onset   Heart disease Other    Diabetes Father 1735       type 2   Thyroid disease Father    Brain cancer Father     Cancer Father        tumor of the brain cancer   Migraines Mother    Kidney disease Mother    Colon polyps Mother    Hyperlipidemia Other        great grandparents    ROS: Constitutional: negative Genitourinary:negative  Exam:   BP 95/63 (BP Location: Right Arm, Patient Position: Sitting, Cuff Size: Large)   Pulse 62   Ht 5\' 4"  (1.626 m) Comment: Reported  Wt 163 lb 6.4 oz (74.1 kg)   BMI 28.05 kg/m   Height: 5\' 4"  (162.6 cm) (Reported)  General appearance: alert, cooperative and appears stated age Head: Normocephalic, without obvious abnormality, atraumatic Neck: no adenopathy, supple, symmetrical, trachea midline and thyroid normal to inspection and palpation Lungs: clear to auscultation bilaterally Breasts: normal appearance, no masses or tenderness Heart: regular rate and rhythm Abdomen: soft, non-tender; bowel sounds normal; no masses,  no organomegaly Extremities: extremities normal, atraumatic, no cyanosis or edema Skin: Skin color, texture, turgor normal. No rashes or lesions Lymph nodes: Cervical, supraclavicular, and axillary nodes normal. No abnormal inguinal nodes palpated Neurologic: Grossly normal   Pelvic: External genitalia:  no lesions              Urethra:  normal appearing urethra with no masses, tenderness or lesions              Bartholins and Skenes: normal                 Vagina: normal appearing vagina with normal color and no discharge, no lesions              Cervix: no lesions              Pap taken: No. Bimanual Exam:  Uterus:  normal size, contour, position, consistency, mobility, non-tender              Adnexa: normal adnexa and no mass, fullness, tenderness               Rectovaginal: Confirms               Anus:  normal sphincter tone, no lesions  Chaperone, Ina Homesonya Garland, CMA, was present for exam.  Assessment/Plan: 1. Well woman exam with routine gynecological exam - Pap smear neg 2023 with neg HR HPV - Mammogram guidelines  reviewed - Colonoscopy reviewed - lab work done with PCP, Dr. Rogelia RohrerBlythe - vaccines reviewed/updated  2. History of endometrial ablation  3. History of hidradenitis suppurativa - on spironlactone  4. PCOS (polycystic ovarian syndrome) - rx for metformin 500mg  qAC.  #90/3RF

## 2022-07-05 ENCOUNTER — Ambulatory Visit: Payer: Federal, State, Local not specified - PPO | Admitting: Family Medicine

## 2022-07-05 ENCOUNTER — Encounter: Payer: Self-pay | Admitting: Family Medicine

## 2022-07-05 VITALS — BP 104/67 | HR 66 | Ht 64.0 in | Wt 166.0 lb

## 2022-07-05 DIAGNOSIS — G43909 Migraine, unspecified, not intractable, without status migrainosus: Secondary | ICD-10-CM | POA: Diagnosis not present

## 2022-07-05 DIAGNOSIS — J0140 Acute pansinusitis, unspecified: Secondary | ICD-10-CM | POA: Diagnosis not present

## 2022-07-05 MED ORDER — FLUTICASONE PROPIONATE 50 MCG/ACT NA SUSP: 2.0000 | Freq: Every day | NASAL | 6 refills | Status: DC

## 2022-07-05 MED ORDER — PREDNISONE 20 MG PO TABS: 40.0000 mg | ORAL_TABLET | Freq: Every day | ORAL | 0 refills | Status: AC

## 2022-07-05 MED ORDER — AMOXICILLIN-POT CLAVULANATE 875-125 MG PO TABS: 1.0000 | ORAL_TABLET | Freq: Two times a day (BID) | ORAL | 0 refills | Status: DC

## 2022-07-05 MED ORDER — FLUCONAZOLE 150 MG PO TABS: 150.0000 mg | ORAL_TABLET | Freq: Every day | ORAL | 0 refills | Status: DC

## 2022-07-05 MED ORDER — KETOROLAC TROMETHAMINE 60 MG/2ML IM SOLN
60.0000 mg | Freq: Once | INTRAMUSCULAR | Status: AC
  Administered 2022-07-05: 60 mg via INTRAMUSCULAR

## 2022-07-05 NOTE — Patient Instructions (Signed)
Toradol shot in office today for pain control Tomorrow you can start the prednisone burst for headache, sinus pressure, etc. Continue your allergy medicine and start Flonase daily for the rest of allergy season Continue warm compresses to your sinuses, especially lacrimal duct - if eye symptoms do not improve, see your eye doctor If sinus symptoms do not improve in the next 2-3 days, start the antibiotics (Augmentin - take with food) - adding Diflucan for possible yeast infection if needed.  Continue supportive measures including rest, hydration, humidifier use, steam showers, warm compresses to sinuses, warm liquids with lemon and honey, and over-the-counter cough, cold, and analgesics as needed.   Please contact office for follow-up if symptoms do not improve or worsen. Seek emergency care if symptoms become severe.

## 2022-07-05 NOTE — Progress Notes (Signed)
Acute Office Visit  Subjective:     Patient ID: Jillian Reyes, female    DOB: Oct 16, 1990, 32 y.o.   MRN: [id number]  Chief Complaint  Patient presents with   Headache   Eye Problem   Patient is in today for severe headache, sinus pressure, tear duct inflammation.   Patient reports that for about a week now she has been having sinus symptoms including nasal congestion, rhinorrhea (yellow mucus), sneezing, maxillary/ethmoid/frontal sinus pressure, left lacrimal duct inflammation/tenderness (4/10), tearing, occasional erythema. States the sinus pressure symptoms have cause migraine-level headache for her, up to 6/10. She has not had any nausea or vomiting. She has been doing warm compresses to lacrimal duct, eye drops from eye doctor, Bernita Raisin, Aleve sinus - no improvement. She denies cough, sore throat, fevers, chills, body aches, chest pain, wheezing, dyspnea.     All review of systems negative except what is listed in the HPI      Objective:    BP 104/67   Pulse 66   Ht  (1.626 m)   Wt 166 lb (75.3 kg)   SpO2 100%   BMI 28.49 kg/m    Physical Exam Vitals reviewed.  Constitutional:      General: She is not in acute distress.    Appearance: Normal appearance. She is not ill-appearing.  HENT:     Head: Normocephalic and atraumatic.     Right Ear: Tympanic membrane normal.     Left Ear: Tympanic membrane normal.     Nose: Congestion and rhinorrhea present.     Right Turbinates: Swollen.     Left Turbinates: Swollen.     Right Sinus: Maxillary sinus tenderness and frontal sinus tenderness present.     Left Sinus: Maxillary sinus tenderness and frontal sinus tenderness present.     Mouth/Throat:     Mouth: Mucous membranes are moist.     Pharynx: Oropharynx is clear. No oropharyngeal exudate or posterior oropharyngeal erythema.  Eyes:     General: Lids are normal.        Right eye: No foreign body, discharge or hordeolum.        Left eye: No foreign body,  discharge or hordeolum.     Extraocular Movements: Extraocular movements intact.     Conjunctiva/sclera:     Right eye: Right conjunctiva is not injected. No chemosis, exudate or hemorrhage.    Left eye: Left conjunctiva is not injected. No chemosis, exudate or hemorrhage. Cardiovascular:     Rate and Rhythm: Normal rate and regular rhythm.     Pulses: Normal pulses.     Heart sounds: Normal heart sounds.  Pulmonary:     Effort: Pulmonary effort is normal.     Breath sounds: Normal breath sounds.  Musculoskeletal:     Cervical back: Normal range of motion and neck supple.  Skin:    General: Skin is warm and dry.  Neurological:     Mental Status: She is alert and oriented to person, place, and time.  Psychiatric:        Mood and Affect: Mood normal.        Behavior: Behavior normal.        Thought Content: Thought content normal.        Judgment: Judgment normal.     No results found for any visits on 07/05/22.      Assessment & Plan:   Problem List Items Addressed This Visit   None Visit Diagnoses     Acute  non-recurrent pansinusitis    -  Primary   Relevant Medications   predniSONE (DELTASONE) 20 MG tablet   amoxicillin-clavulanate (AUGMENTIN) 875-125 MG tablet   fluticasone (FLONASE) 50 MCG/ACT nasal spray   fluconazole (DIFLUCAN) 150 MG tablet   Migraine without status migrainosus, not intractable, unspecified migraine type       Relevant Medications   ketorolac (TORADOL) injection 60 mg (Completed)     Toradol shot in office today for pain control Tomorrow you can start the prednisone burst for headache, sinus pressure, etc. Continue your allergy medicine and start Flonase daily for the rest of allergy season Continue warm compresses to your sinuses, especially lacrimal duct - if eye symptoms do not improve, see your eye doctor If sinus symptoms do not improve in the next 2-3 days, start the antibiotics (Augmentin - take with food) - adding Diflucan for possible  yeast infection if needed.  Continue supportive measures including rest, hydration, humidifier use, steam showers, warm compresses to sinuses, warm liquids with lemon and honey, and over-the-counter cough, cold, and analgesics as needed.   Patient aware of signs/symptoms requiring further/urgent evaluation.    Meds ordered this encounter  Medications   predniSONE (DELTASONE) 20 MG tablet    Sig: Take 2 tablets (40 mg total) by mouth daily with breakfast for 5 days.    Dispense:  10 tablet    Refill:  0    Order Specific Question:   Supervising Provider    Answer:   Danise Edge A [[id number]]   amoxicillin-clavulanate (AUGMENTIN) 875-125 MG tablet    Sig: Take 1 tablet by mouth 2 (two) times daily.    Dispense:  20 tablet    Refill:  0    Order Specific Question:   Supervising Provider    Answer:   Danise Edge A [[id number]]   fluticasone (FLONASE) 50 MCG/ACT nasal spray    Sig: Place 2 sprays into both nostrils daily.    Dispense:  16 g    Refill:  6    Order Specific Question:   Supervising Provider    Answer:   Danise Edge A [[id number]]   fluconazole (DIFLUCAN) 150 MG tablet    Sig: Take 1 tablet (150 mg total) by mouth daily. May repeat in 3 days if needed.    Dispense:  2 tablet    Refill:  0    Order Specific Question:   Supervising Provider    Answer:   Danise Edge A [[id number]]   ketorolac (TORADOL) injection 60 mg    Return if symptoms worsen or fail to improve.  Clayborne Dana, NP

## 2022-07-08 ENCOUNTER — Other Ambulatory Visit: Payer: Self-pay | Admitting: Family Medicine

## 2022-07-09 ENCOUNTER — Encounter: Payer: Self-pay | Admitting: *Deleted

## 2022-07-17 NOTE — Progress Notes (Deleted)
NEUROLOGY FOLLOW UP OFFICE NOTE  Jillian Reyes [id number]  Assessment/Plan:   Migraine with aura, without status migrainosus, not intractable     Migraine prevention:  Will try to restart Qulipta 60mg  daily, provided copay card and samples.   Migraine rescue:  Resend Ubrelvy 100mg  - She has failed two triptans and Nurtec.  Bernita Raisin has been effective. Limit use of pain relievers to no more than 2 days out of week to prevent risk of rebound or medication-overuse headache. Keep headache diary Follow up 4-5 months.     Subjective:  Jillian Reyes is a 32 year old right-handed female who follows up for migraine.   UPDATE:  Restarted Qulipta. Intensity:  severe Duration:  *** with Bernita Raisin Frequency:  ***. She did have an intractable migraine in February requiring migraine cocktail. She also had sinusitis this month, which aggravated her migraines.    Current NSAIDS/analgesics:  Excedrin Migraine. Diclofenac 75mg  BID (for knee pain) Current triptans:  none Current ergotamine:  none Current anti-emetic:  Zofran ODT 4mg  Current muscle relaxants:  none Current Antihypertensive medications:  none Current Antidepressant medications:  none Current Anticonvulsant medications:  none Current anti-CGRP:  Qulipta 60mg  daily, Nurtec (rescue) Current Vitamins/Herbal/Supplements:  MVI Current Antihistamines/Decongestants:  Flonase Other therapy:  none Hormone/birth control:  none   Caffeine:  1 cup coffee daily.  Soda once in a while Diet:  3 bottles of water daily.  Skips meals Exercise:  No Depression:  yes; Anxiety:  Yes.  Sister recently passed away from a drug overdose.  History of Bipolar disorder. Other pain:  no Sleep hygiene:  Poor.  Trouble staying asleep.  Snores.  Daytime fatigue.  She had a sleep study that revealed mild sleep apnea.  Conservative therapy was recommended.  ***   HISTORY:  Beginning at age 80 years old, she had headaches.  In 2012 headaches became  more severe.  They start in the back of the neck and base of her skull and radiate to front of head bilaterally.  It is throbbing and associated with nausea, photophobia, phonophobia, rarely vomiting.  Sometimes sees stars or dots.  They usually last 1 to  2 days.  They occur once a month to once every other month.  However, she a dull headache 20 days a month.  Sometimes dull headache may be in temples as well.  Triggers include perfumes and cigarette smell.  Rest helps.  Maxalt ineffective.  Takes Excedrin Migraine daily.   MRI of cervical spine from 1/3/2017was unremarkable.     Past NSAIDS/analgesics:  Advil, Tylenol, naproxen, hydrocodone Past abortive triptans:  sumatriptan tab, Maxalt Past abortive ergotamine:  none Past muscle relaxants:  Flexeril, tizanidine Past anti-emetic:  promethazine Past antihypertensive medications:  propranolol Past antidepressant medications:  Duloxetine (side effects) Past anticonvulsant medications:  Topiramate, Depakote Past anti-CGRP:  Aimovig 140mg  (made her sick), Ubrelvy (helpful but not formulary) Past vitamins/Herbal/Supplements:  none Past antihistamines/decongestants:  none Other past therapies:  Dry needling (helped)     Family history of headache:  Mom (migraines), father (migraines, brain tumor) She has PCOS.  She has irregular periods.    PAST MEDICAL HISTORY: Past Medical History:  Diagnosis Date   Acne    Asthma due to environmental allergies    per pt moderate, prn inhaler,  followed by pcp   Bipolar 1 disorder (HCC)    Chronic back pain    Chronic pelvic pain in female    Dyslipidemia 02/18/2013   Eczema  GAD (generalized anxiety disorder)    GERD (gastroesophageal reflux disease)    History of abnormal cervical Pap smear    History of chlamydia 2015   History of gestational diabetes    Irregular periods/menstrual cycles    Migraines    PCOS (polycystic ovarian syndrome)    Vitamin D deficiency    Wears glasses      MEDICATIONS: Current Outpatient Medications on File Prior to Visit  Medication Sig Dispense Refill   albuterol (VENTOLIN HFA) 108 (90 Base) MCG/ACT inhaler Inhale 2 puffs into the lungs every 6 (six) hours as needed for wheezing or shortness of breath. 18 g 2   alprazolam (XANAX) 2 MG tablet TAKE 1 TABLET (2 MG TOTAL) BY MOUTH AT BEDTIME AS NEEDED. FOR SLEEP. 30 tablet 1   amoxicillin-clavulanate (AUGMENTIN) 875-125 MG tablet Take 1 tablet by mouth 2 (two) times daily. 20 tablet 0   Atogepant (QULIPTA) 60 MG TABS Take 60 mg by mouth daily. 30 tablet 11   clobetasol cream (TEMOVATE) 0.05 % APPLY TOPICALLY DAILY AS NEEDED. ECZEMA 60 g 3   DUPIXENT 300 MG/2ML SOPN      EUCRISA 2 % OINT as needed.     fluconazole (DIFLUCAN) 150 MG tablet Take 1 tablet (150 mg total) by mouth daily. May repeat in 3 days if needed. 2 tablet 0   fluticasone (FLONASE) 50 MCG/ACT nasal spray Place 2 sprays into both nostrils daily. 16 g 6   lamoTRIgine (LAMICTAL) 200 MG tablet TAKE 1 TABLET BY MOUTH EVERYDAY AT BEDTIME 90 tablet 0   metFORMIN (GLUCOPHAGE) 500 MG tablet Take 1 tablet (500 mg total) by mouth daily with breakfast. 90 tablet 3   metroNIDAZOLE (METROCREAM) 0.75 % cream Apply topically 2 (two) times daily.     Multiple Vitamin (MULTIVITAMIN ADULT PO) Take by mouth daily.     ondansetron (ZOFRAN-ODT) 8 MG disintegrating tablet Take 1 tablet (8 mg total) by mouth every 8 (eight) hours as needed for nausea or vomiting. 20 tablet 0   spironolactone (ALDACTONE) 50 MG tablet Take 50 mg by mouth daily. 1 daily, 2 at HS     tacrolimus (PROTOPIC) 0.1 % ointment Apply topically 2 (two) times daily.     Ubrogepant (UBRELVY) 100 MG TABS Take 1 tablet by mouth as needed. May repeat after 2 hours.  Maximum 2 tablets in 24 hours. 10 tablet 11   No current facility-administered medications on file prior to visit.    ALLERGIES: Allergies  Allergen Reactions   Cymbalta [Duloxetine Hcl] Nausea And Vomiting     FAMILY HISTORY: Family History  Problem Relation Age of Onset   Heart disease Other    Diabetes Father 25       type 2   Thyroid disease Father    Brain cancer Father    Cancer Father        tumor of the brain cancer   Migraines Mother    Kidney disease Mother    Colon polyps Mother    Hyperlipidemia Other        great grandparents      Objective:  *** General: No acute distress.  Patient appears well-groomed.   Head:  Normocephalic/atraumatic Eyes:  Fundi examined but not visualized Neck: supple, no paraspinal tenderness, full range of motion Heart:  Regular rate and rhythm Neurological Exam:alert and oriented to person, place, and time.  Speech fluent and not dysarthric, language intact.  CN II-XII intact. Bulk and tone normal, muscle strength 5/5 throughout.  Sensation to light touch intact.  Deep tendon reflexes 2+ throughout.  Finger to nose testing intact.  Gait normal, Romberg negative.   Shon Millet, DO  CC: Danise Edge, MD

## 2022-07-18 ENCOUNTER — Ambulatory Visit: Payer: Federal, State, Local not specified - PPO | Admitting: Neurology

## 2022-07-18 ENCOUNTER — Encounter: Payer: Self-pay | Admitting: Neurology

## 2022-07-18 DIAGNOSIS — Z029 Encounter for administrative examinations, unspecified: Secondary | ICD-10-CM

## 2022-07-23 ENCOUNTER — Other Ambulatory Visit: Payer: Self-pay | Admitting: Family Medicine

## 2022-07-23 ENCOUNTER — Telehealth: Payer: Self-pay | Admitting: Family Medicine

## 2022-07-23 DIAGNOSIS — E785 Hyperlipidemia, unspecified: Secondary | ICD-10-CM

## 2022-07-23 MED ORDER — ALPRAZOLAM 2 MG PO TABS: 2.0000 mg | ORAL_TABLET | Freq: Every evening | ORAL | 0 refills | Status: DC | PRN

## 2022-07-23 NOTE — Telephone Encounter (Signed)
Pt requesting refill on alprazolam [name]) 2 MG tablet [ . She has appt on 08/06/22 with Macario Carls. Please send refill to [facility] on [address]

## 2022-08-06 ENCOUNTER — Ambulatory Visit: Payer: Federal, State, Local not specified - PPO | Admitting: Family

## 2022-08-07 ENCOUNTER — Ambulatory Visit: Payer: Federal, State, Local not specified - PPO | Admitting: Family Medicine

## 2022-08-07 ENCOUNTER — Ambulatory Visit: Payer: Federal, State, Local not specified - PPO | Admitting: Family

## 2022-08-18 ENCOUNTER — Telehealth: Payer: Self-pay | Admitting: Pharmacy Technician

## 2022-08-18 NOTE — Telephone Encounter (Signed)
Patient Advocate Encounter  Received notification from [employer] that prior authorization for UBRELVY 100MG  is required.   PA submitted on 6.1.24 Key [id number] Status is pending

## 2022-09-28 ENCOUNTER — Other Ambulatory Visit (HOSPITAL_COMMUNITY): Payer: Self-pay

## 2022-10-01 NOTE — Progress Notes (Unsigned)
NEUROLOGY FOLLOW UP OFFICE NOTE  Jillian Reyes [id number]  Assessment/Plan:   Migraine with aura, without status migrainosus, not intractable     Migraine prevention:  Qulipta 60mg  daily *** Migraine rescue:  Ubrelvy 100mg  *** Limit use of pain relievers to no more than 2 days out of week to prevent risk of rebound or medication-overuse headache. Keep headache diary Follow up ***     Subjective:  Jillian Reyes is a 32 year old right-handed female who follows up for migraine.   UPDATE: Restarted Qulipta Intensity:  severe Duration:  *** with Ubrelvy  Frequency:  4 days a month  Current NSAIDS/analgesics:  Excedrin Migraine. Diclofenac 75mg  BID (for knee pain) Current triptans:  none Current ergotamine:  none Current anti-emetic:  Zofran ODT 4mg  Current muscle relaxants:  none Current Antihypertensive medications:  none Current Antidepressant medications:  none Current Anticonvulsant medications:  none Current anti-CGRP:  Qulipta 60mg  daily, Ubrelvy 100mg  Current Vitamins/Herbal/Supplements:  MVI Current Antihistamines/Decongestants:  Flonase Other therapy:  none Hormone/birth control:  none   Caffeine:  1 cup coffee daily.  Soda once in a while Diet:  3 bottles of water daily.  Skips meals Exercise:  No Depression:  yes; Anxiety:  Yes.  Sister recently passed away from a drug overdose.  History of Bipolar disorder. Other pain:  no Sleep hygiene:  Referred to sleep medicine for evaluation of sleep apnea.  Home sleep study revealed mild sleep apnea.  ***   HISTORY:  Beginning at age 32 years old, she had headaches.  In 2012 headaches became more severe.  They start in the back of the neck and base of her skull and radiate to front of head bilaterally.  It is throbbing and associated with nausea, photophobia, phonophobia, rarely vomiting.  Sometimes sees stars or dots.  They usually last 1 to  2 days.  They occur once a month to once every other month.  However,  she a dull headache 20 days a month.  Sometimes dull headache may be in temples as well.  Triggers include perfumes and cigarette smell.  Rest helps.  Maxalt ineffective.  Takes Excedrin Migraine daily.   MRI of cervical spine from 1/3/2017was unremarkable.     Past NSAIDS/analgesics:  Advil, Tylenol, naproxen, hydrocodone Past abortive triptans:  sumatriptan tab, Maxalt Past abortive ergotamine:  none Past muscle relaxants:  Flexeril, tizanidine Past anti-emetic:  promethazine Past antihypertensive medications:  propranolol Past antidepressant medications:  Duloxetine (side effects) Past anticonvulsant medications:  Topiramate, Depakote Past anti-CGRP:  Aimovig 140mg  (made her sick), Nurtec PRN  Past vitamins/Herbal/Supplements:  none Past antihistamines/decongestants:  none Other past therapies:  Dry needling (helped)     Family history of headache:  Mom (migraines), father (migraines, brain tumor) She has PCOS.  She has irregular periods.    PAST MEDICAL HISTORY: Past Medical History:  Diagnosis Date   Acne    Asthma due to environmental allergies    per pt moderate, prn inhaler,  followed by pcp   Bipolar 1 disorder (HCC)    Chronic back pain    Chronic pelvic pain in female    Dyslipidemia 02/18/2013   Eczema    GAD (generalized anxiety disorder)    GERD (gastroesophageal reflux disease)    History of abnormal cervical Pap smear    History of chlamydia 2015   History of gestational diabetes    Irregular periods/menstrual cycles    Migraines    PCOS (polycystic ovarian syndrome)    Vitamin D  deficiency    Wears glasses     MEDICATIONS: Current Outpatient Medications on File Prior to Visit  Medication Sig Dispense Refill   albuterol (VENTOLIN HFA) 108 (90 Base) MCG/ACT inhaler Inhale 2 puffs into the lungs every 6 (six) hours as needed for wheezing or shortness of breath. 18 g 2   alprazolam (XANAX) 2 MG tablet Take 1 tablet (2 mg total) by mouth at bedtime as  needed. for sleep. 30 tablet 0   amoxicillin-clavulanate (AUGMENTIN) 875-125 MG tablet Take 1 tablet by mouth 2 (two) times daily. 20 tablet 0   Atogepant (QULIPTA) 60 MG TABS Take 60 mg by mouth daily. 30 tablet 11   clobetasol cream (TEMOVATE) 0.05 % APPLY TOPICALLY DAILY AS NEEDED. ECZEMA 60 g 3   DUPIXENT 300 MG/2ML SOPN      EUCRISA 2 % OINT as needed.     fluconazole (DIFLUCAN) 150 MG tablet Take 1 tablet (150 mg total) by mouth daily. May repeat in 3 days if needed. 2 tablet 0   fluticasone (FLONASE) 50 MCG/ACT nasal spray Place 2 sprays into both nostrils daily. 16 g 6   lamoTRIgine (LAMICTAL) 200 MG tablet TAKE 1 TABLET BY MOUTH EVERYDAY AT BEDTIME 90 tablet 0   metFORMIN (GLUCOPHAGE) 500 MG tablet Take 1 tablet (500 mg total) by mouth daily with breakfast. 90 tablet 3   metroNIDAZOLE (METROCREAM) 0.75 % cream Apply topically 2 (two) times daily.     Multiple Vitamin (MULTIVITAMIN ADULT PO) Take by mouth daily.     ondansetron (ZOFRAN-ODT) 8 MG disintegrating tablet Take 1 tablet (8 mg total) by mouth every 8 (eight) hours as needed for nausea or vomiting. 20 tablet 0   spironolactone (ALDACTONE) 50 MG tablet Take 50 mg by mouth daily. 1 daily, 2 at HS     tacrolimus (PROTOPIC) 0.1 % ointment Apply topically 2 (two) times daily.     Ubrogepant (UBRELVY) 100 MG TABS Take 1 tablet by mouth as needed. May repeat after 2 hours.  Maximum 2 tablets in 24 hours. 10 tablet 11   No current facility-administered medications on file prior to visit.    ALLERGIES: Allergies  Allergen Reactions   Cymbalta [Duloxetine Hcl] Nausea And Vomiting    FAMILY HISTORY: Family History  Problem Relation Age of Onset   Heart disease Other    Diabetes Father 27       type 2   Thyroid disease Father    Brain cancer Father    Cancer Father        tumor of the brain cancer   Migraines Mother    Kidney disease Mother    Colon polyps Mother    Hyperlipidemia Other        great grandparents       Objective:  *** General: No acute distress.  Patient appears well-groomed.   Head:  Normocephalic/atraumatic Eyes:  Fundi examined but not visualized Neck: supple, no paraspinal tenderness, full range of motion Heart:  Regular rate and rhythm Lungs:  Clear to auscultation bilaterally Back: No paraspinal tenderness Neurological Exam: ***   Shon Millet, DO  CC: Danise Edge, MD

## 2022-10-02 ENCOUNTER — Encounter: Payer: Self-pay | Admitting: Neurology

## 2022-10-02 ENCOUNTER — Ambulatory Visit: Payer: Federal, State, Local not specified - PPO | Admitting: Neurology

## 2022-10-02 VITALS — BP 105/67 | HR 70 | Ht 65.0 in | Wt 162.0 lb

## 2022-10-02 DIAGNOSIS — G44221 Chronic tension-type headache, intractable: Secondary | ICD-10-CM | POA: Diagnosis not present

## 2022-10-02 DIAGNOSIS — G43009 Migraine without aura, not intractable, without status migrainosus: Secondary | ICD-10-CM | POA: Diagnosis not present

## 2022-10-02 MED ORDER — UBRELVY 100 MG PO TABS: 1.0000 | ORAL_TABLET | ORAL | 11 refills | Status: DC | PRN

## 2022-10-02 MED ORDER — NORTRIPTYLINE HCL 10 MG PO CAPS: 10.0000 mg | ORAL_CAPSULE | Freq: Every day | ORAL | 5 refills | Status: DC

## 2022-10-02 NOTE — Patient Instructions (Signed)
Stop qulipta.  Start nortriptyline 10mg  at bedtime.  If no improvement in headaches in 4 weeks, contact me and I will increase note Take Bernita Raisin a needed. Limit use of pain relievers to no more than 2 days out of week to prevent risk of rebound or medication-overuse headache. Have appropriate accommodation forms be faxed to me Follow up 5 months.

## 2022-10-11 ENCOUNTER — Telehealth: Payer: Self-pay

## 2022-10-11 NOTE — Telephone Encounter (Signed)
Rep for ubrelvy stopped by need to know the outcome of the PA started 6/1.

## 2022-10-26 ENCOUNTER — Other Ambulatory Visit: Payer: Self-pay | Admitting: Neurology

## 2022-11-02 ENCOUNTER — Ambulatory Visit: Payer: Federal, State, Local not specified - PPO | Admitting: Family Medicine

## 2022-11-02 ENCOUNTER — Encounter: Payer: Self-pay | Admitting: Family Medicine

## 2022-11-02 VITALS — BP 109/70 | HR 79 | Ht 65.0 in | Wt 167.0 lb

## 2022-11-02 DIAGNOSIS — E785 Hyperlipidemia, unspecified: Secondary | ICD-10-CM | POA: Diagnosis not present

## 2022-11-02 DIAGNOSIS — Z0000 Encounter for general adult medical examination without abnormal findings: Secondary | ICD-10-CM | POA: Diagnosis not present

## 2022-11-02 DIAGNOSIS — E282 Polycystic ovarian syndrome: Secondary | ICD-10-CM

## 2022-11-02 DIAGNOSIS — F430 Acute stress reaction: Secondary | ICD-10-CM

## 2022-11-02 DIAGNOSIS — F411 Generalized anxiety disorder: Secondary | ICD-10-CM

## 2022-11-02 DIAGNOSIS — E871 Hypo-osmolality and hyponatremia: Secondary | ICD-10-CM

## 2022-11-02 DIAGNOSIS — F319 Bipolar disorder, unspecified: Secondary | ICD-10-CM | POA: Diagnosis not present

## 2022-11-02 DIAGNOSIS — E559 Vitamin D deficiency, unspecified: Secondary | ICD-10-CM | POA: Diagnosis not present

## 2022-11-02 DIAGNOSIS — Z79899 Other long term (current) drug therapy: Secondary | ICD-10-CM | POA: Diagnosis not present

## 2022-11-02 LAB — COMPREHENSIVE METABOLIC PANEL
ALT: 18 U/L (ref 0–35)
AST: 15 U/L (ref 0–37)
Albumin: 4.6 g/dL (ref 3.5–5.2)
Alkaline Phosphatase: 54 U/L (ref 39–117)
BUN: 11 mg/dL (ref 6–23)
CO2: 27 meq/L (ref 19–32)
Calcium: 9.5 mg/dL (ref 8.4–10.5)
Chloride: 99 meq/L (ref 96–112)
Creatinine, Ser: 0.9 mg/dL (ref 0.40–1.20)
GFR: 84.27 mL/min (ref >60.00)
Glucose, Bld: 84 mg/dL (ref 70–99)
Potassium: 4.8 mEq/L (ref 3.5–5.1)
Sodium: 132 meq/L — ABNORMAL LOW (ref 135–145)
Total Bilirubin: 0.7 mg/dL (ref 0.2–1.2)
Total Protein: 6.9 g/dL (ref 6.0–8.3)

## 2022-11-02 LAB — LIPID PANEL
Cholesterol: 154 mg/dL (ref 0–200)
HDL: 46.8 mg/dL (ref >39.00)
LDL Cholesterol: 82 mg/dL (ref 0–99)
NonHDL: 106.87
Total CHOL/HDL Ratio: 3
Triglycerides: 122 mg/dL (ref 0.0–149.0)
VLDL: 24.4 mg/dL (ref 0.0–40.0)

## 2022-11-02 LAB — TSH: TSH: 1.47 u[IU]/mL (ref 0.35–5.50)

## 2022-11-02 LAB — VITAMIN D 25 HYDROXY (VIT D DEFICIENCY, FRACTURES): VITD: 27.97 ng/mL — ABNORMAL LOW (ref 30.00–100.00)

## 2022-11-02 MED ORDER — LAMOTRIGINE 200 MG PO TABS: 200.0000 mg | ORAL_TABLET | Freq: Every day | ORAL | 0 refills | Status: DC

## 2022-11-02 MED ORDER — ALPRAZOLAM 2 MG PO TABS
2.0000 mg | ORAL_TABLET | Freq: Every evening | ORAL | 0 refills | Status: DC | PRN
Start: 2022-11-02 — End: 2023-01-14

## 2022-11-02 NOTE — Assessment & Plan Note (Signed)
Mood Disorder (Anxiety, Insomnia, Bipolar) Stable on current regimen of Xanax and Lamictal. No reported side effects. No SI/HI -Continue Xanax and Lamictal as prescribed. -Plan for three-month follow-up due to daily controlled substance use.

## 2022-11-02 NOTE — Assessment & Plan Note (Signed)
Repeat labs:

## 2022-11-02 NOTE — Assessment & Plan Note (Signed)
Previous low levels, not recently checked. -Order Vitamin D level with other labs today.

## 2022-11-02 NOTE — Progress Notes (Signed)
Established Patient Office Visit  Subjective   Patient ID: Jillian Reyes, female    DOB: December 02, 1990  Age: 32 y.o. MRN: [id number]  Chief Complaint  Patient presents with   Medical Management of Chronic Issues    HPI  Discussed the use of AI scribe software for clinical note transcription with the patient, who gave verbal consent to proceed.  History of Present Illness   The patient, with a history of anxiety, insomnia, bipolar disorder, and polycystic ovary syndrome (PCOS), presents for a routine follow-up and medication refills. They report no new or worsening symptoms related to their mood disorders and feel stable on their current regimen of Xanax and Lamictal, with no side effects noted.  The patient also reports taking Metformin for PCOS and Spironolactone for acne, both of which are well-tolerated. They recently started Nortriptyline for migraines, but express dissatisfaction with its efficacy and plan to discuss dosage adjustments with their neurologist.  The patient has been experiencing stress due to work and family responsibilities, but does not engage in counseling for their mood disorders, as they do not find it beneficial. They have a history of low vitamin D levels, but this has not been checked recently.           11/02/2022    8:31 AM 06/22/2022    8:19 AM 06/14/2021    3:02 PM  PHQ9 SCORE ONLY  PHQ-9 Total Score 5 0 0      11/02/2022    8:31 AM  GAD 7 : Generalized Anxiety Score  Nervous, Anxious, on Edge 2  Control/stop worrying 3  Worry too much - different things 3  Trouble relaxing 3  Restless 2  Easily annoyed or irritable 2  Afraid - awful might happen 1  Total GAD 7 Score 16  Anxiety Difficulty Somewhat difficult          ROS All review of systems negative except what is listed in the HPI    Objective:     BP 109/70   Pulse 79   Ht 5\' 5"  (1.651 m)   Wt 167 lb (75.8 kg)   SpO2 99%   BMI 27.79 kg/m    Physical Exam Vitals  reviewed.  Constitutional:      Appearance: Normal appearance.  Cardiovascular:     Rate and Rhythm: Normal rate and regular rhythm.     Heart sounds: Normal heart sounds.  Pulmonary:     Effort: Pulmonary effort is normal.     Breath sounds: Normal breath sounds.  Skin:    General: Skin is warm and dry.  Neurological:     Mental Status: She is alert and oriented to person, place, and time.  Psychiatric:        Mood and Affect: Mood normal.        Behavior: Behavior normal.        Thought Content: Thought content normal.        Judgment: Judgment normal.      No results found for any visits on 11/02/22.    The ASCVD Risk score (Arnett DK, et al., 2019) failed to calculate for the following reasons:   The 2019 ASCVD risk score is only valid for ages 87 to 46    Assessment & Plan:   Problem List Items Addressed This Visit     Bipolar 1 disorder (HCC)    Mood Disorder (Anxiety, Insomnia, Bipolar) Stable on current regimen of Xanax and Lamictal. No reported side effects. No SI/HI -  Continue Xanax and Lamictal as prescribed. -Plan for three-month follow-up due to daily controlled substance use.      Relevant Medications   lamoTRIgine (LAMICTAL) 200 MG tablet   Anxiety as acute reaction to exceptional stress    Mood Disorder (Anxiety, Insomnia, Bipolar) Stable on current regimen of Xanax and Lamictal. No reported side effects. No SI/HI -Continue Xanax and Lamictal as prescribed. -Plan for three-month follow-up due to daily controlled substance use.      Relevant Medications   alprazolam (XANAX) 2 MG tablet   Dyslipidemia    Repeat labs      Relevant Orders   Lipid panel   Preventative health care   Relevant Orders   Comprehensive metabolic panel   Lipid panel   Vitamin D deficiency    Previous low levels, not recently checked. -Order Vitamin D level with other labs today.      Relevant Orders   VITAMIN D 25 Hydroxy (Vit-D Deficiency, Fractures)   PCOS  (polycystic ovarian syndrome)    Managed with Metformin and Spironolactone. -Continue Metformin and Spironolactone as prescribed.      Relevant Orders   Hemoglobin A1c   TSH   Other Visit Diagnoses     Encounter for long-term (current) use of high-risk medication    -  Primary   Relevant Orders   DRUG MONITORING PANEL [id number] , URINE   High risk medication use   PDMP reviewed.  Refills provided UDS and contract updated today      Relevant Medications   alprazolam (XANAX) 2 MG tablet   Other Relevant Orders   DRUG MONITORING PANEL [id number] , URINE       Return in about 3 months (around 02/02/2023) for routine follow-up.    Clayborne Dana, NP

## 2022-11-02 NOTE — Assessment & Plan Note (Signed)
Managed with Metformin and Spironolactone. -Continue Metformin and Spironolactone as prescribed.

## 2022-11-05 ENCOUNTER — Other Ambulatory Visit (HOSPITAL_COMMUNITY): Payer: Self-pay

## 2022-11-05 LAB — HEMOGLOBIN A1C: Hgb A1c MFr Bld: 5.1 % (ref 4.6–6.5)

## 2022-11-05 NOTE — Addendum Note (Signed)
Addended by: Hyman Hopes B on: 11/05/2022 02:30 PM   Modules accepted: Orders

## 2022-11-08 LAB — DRUG MONITORING PANEL 375977 , URINE
Alcohol Metabolites: NEGATIVE ng/mL (ref <500)
Alphahydroxyalprazolam: 201 ng/mL — ABNORMAL HIGH (ref <25)
Alphahydroxymidazolam: NEGATIVE ng/mL (ref <50)
Alphahydroxytriazolam: NEGATIVE ng/mL (ref <50)
Aminoclonazepam: NEGATIVE ng/mL (ref <25)
Amphetamines: NEGATIVE ng/mL (ref <500)
Barbiturates: NEGATIVE ng/mL (ref <300)
Benzodiazepines: POSITIVE ng/mL — AB (ref <100)
Cocaine Metabolite: NEGATIVE ng/mL (ref <150)
Desmethyltramadol: NEGATIVE ng/mL (ref <100)
Hydroxyethylflurazepam: NEGATIVE ng/mL (ref <50)
Lorazepam: NEGATIVE ng/mL (ref <50)
Marijuana Metabolite: NEGATIVE ng/mL (ref <20)
Nordiazepam: NEGATIVE ng/mL (ref <50)
Opiates: NEGATIVE ng/mL (ref <100)
Oxazepam: NEGATIVE ng/mL (ref <50)
Oxycodone: NEGATIVE ng/mL (ref <100)
Temazepam: NEGATIVE ng/mL (ref <50)
Tramadol: NEGATIVE ng/mL (ref <100)

## 2022-11-08 LAB — DM TEMPLATE

## 2022-11-28 DIAGNOSIS — F411 Generalized anxiety disorder: Secondary | ICD-10-CM | POA: Diagnosis not present

## 2022-11-28 DIAGNOSIS — F319 Bipolar disorder, unspecified: Secondary | ICD-10-CM | POA: Diagnosis not present

## 2022-12-31 ENCOUNTER — Other Ambulatory Visit (HOSPITAL_COMMUNITY): Payer: Self-pay

## 2022-12-31 NOTE — Telephone Encounter (Signed)
I have called [employer]    They will be faxing a set of questions to [employer]

## 2022-12-31 NOTE — Telephone Encounter (Signed)
PA request has been  called on . New Encounter created for follow up. For additional info see Pharmacy Prior Auth telephone encounter from 08/18/2022.

## 2023-01-01 DIAGNOSIS — K5900 Constipation, unspecified: Secondary | ICD-10-CM | POA: Diagnosis not present

## 2023-01-10 ENCOUNTER — Other Ambulatory Visit (HOSPITAL_COMMUNITY): Payer: Self-pay

## 2023-01-14 ENCOUNTER — Other Ambulatory Visit: Payer: Self-pay | Admitting: Family

## 2023-01-14 ENCOUNTER — Encounter: Payer: Self-pay | Admitting: Family Medicine

## 2023-01-14 DIAGNOSIS — F411 Generalized anxiety disorder: Secondary | ICD-10-CM

## 2023-01-14 DIAGNOSIS — Z79899 Other long term (current) drug therapy: Secondary | ICD-10-CM

## 2023-01-14 MED ORDER — ALPRAZOLAM 2 MG PO TABS
2.0000 mg | ORAL_TABLET | Freq: Every evening | ORAL | 1 refills | Status: DC | PRN
Start: 2023-01-14 — End: 2023-05-07

## 2023-01-14 NOTE — Telephone Encounter (Signed)
Requesting: alprazolam 2mg  Contract: 11/02/22 UDS: 11/02/22   Last Visit: 11/02/22 w/ Ladona Ridgel Next Visit: None Last Refill: 11/02/22 #30 and 0RF   Please Advise

## 2023-01-15 ENCOUNTER — Other Ambulatory Visit (HOSPITAL_COMMUNITY): Payer: Self-pay

## 2023-01-15 NOTE — Telephone Encounter (Signed)
Called pt's ins again, since I don't see the form they said they faxed. They will resend it. This med is still not on formulary, therefor PA can't be requested over the phone or electronically. They have to do it by form to show the pt meets the exception.

## 2023-01-16 ENCOUNTER — Other Ambulatory Visit: Payer: Self-pay | Admitting: Neurology

## 2023-01-16 DIAGNOSIS — F319 Bipolar disorder, unspecified: Secondary | ICD-10-CM

## 2023-01-16 MED ORDER — LAMOTRIGINE 200 MG PO TABS
200.0000 mg | ORAL_TABLET | Freq: Every day | ORAL | 0 refills | Status: DC
Start: 2023-01-16 — End: 2023-05-06

## 2023-01-17 ENCOUNTER — Telehealth: Payer: Self-pay | Admitting: Pharmacy Technician

## 2023-01-17 NOTE — Telephone Encounter (Signed)
PA request has been Submitted. New Encounter created for follow up. For additional info see Pharmacy Prior Auth telephone encounter from 01/17/23.  (Finally got the form to fill out)

## 2023-01-17 NOTE — Telephone Encounter (Signed)
Pharmacy Patient Advocate Encounter   Received notification from Pt Calls Messages that prior authorization for Jillian Reyes is required/requested.   Insurance verification completed.   The patient is insured through [employer] .   Per test claim: PA required: Submitted via fax form per requirement. Faxed to [facility] clinical Services [phone]

## 2023-01-21 ENCOUNTER — Encounter (HOSPITAL_BASED_OUTPATIENT_CLINIC_OR_DEPARTMENT_OTHER): Payer: Self-pay | Admitting: Obstetrics & Gynecology

## 2023-01-21 ENCOUNTER — Encounter (HOSPITAL_BASED_OUTPATIENT_CLINIC_OR_DEPARTMENT_OTHER): Payer: Self-pay | Admitting: Emergency Medicine

## 2023-01-21 ENCOUNTER — Other Ambulatory Visit: Payer: Self-pay

## 2023-01-21 ENCOUNTER — Emergency Department (HOSPITAL_BASED_OUTPATIENT_CLINIC_OR_DEPARTMENT_OTHER)
Admission: EM | Admit: 2023-01-21 | Discharge: 2023-01-21 | Disposition: A | Discharge status: Home / Self Care | Payer: Federal, State, Local not specified - PPO | Attending: Emergency Medicine | Admitting: Emergency Medicine

## 2023-01-21 ENCOUNTER — Emergency Department (HOSPITAL_BASED_OUTPATIENT_CLINIC_OR_DEPARTMENT_OTHER): Payer: Federal, State, Local not specified - PPO

## 2023-01-21 DIAGNOSIS — N83201 Unspecified ovarian cyst, right side: Secondary | ICD-10-CM | POA: Diagnosis not present

## 2023-01-21 DIAGNOSIS — K760 Fatty (change of) liver, not elsewhere classified: Secondary | ICD-10-CM | POA: Diagnosis not present

## 2023-01-21 DIAGNOSIS — N3289 Other specified disorders of bladder: Secondary | ICD-10-CM | POA: Diagnosis not present

## 2023-01-21 DIAGNOSIS — K625 Hemorrhage of anus and rectum: Secondary | ICD-10-CM | POA: Diagnosis not present

## 2023-01-21 DIAGNOSIS — R109 Unspecified abdominal pain: Secondary | ICD-10-CM | POA: Diagnosis not present

## 2023-01-21 LAB — COMPREHENSIVE METABOLIC PANEL
ALT: 17 U/L (ref 0–44)
AST: 15 U/L (ref 15–41)
Albumin: 4.7 g/dL (ref 3.5–5.0)
Alkaline Phosphatase: 55 U/L (ref 38–126)
Anion gap: 8 (ref 5–15)
BUN: 13 mg/dL (ref 6–20)
CO2: 25 mmol/L (ref 22–32)
Calcium: 9.5 mg/dL (ref 8.9–10.3)
Chloride: 103 mmol/L (ref 98–111)
Creatinine, Ser: 0.91 mg/dL (ref 0.44–1.00)
GFR, Estimated: >60 mL/min (ref >60)
Glucose, Bld: 102 mg/dL — ABNORMAL HIGH (ref 70–99)
Potassium: 4.1 mmol/L (ref 3.5–5.1)
Sodium: 136 mmol/L (ref 135–145)
Total Bilirubin: 0.5 mg/dL (ref <1.2)
Total Protein: 7.4 g/dL (ref 6.5–8.1)

## 2023-01-21 LAB — CBC WITH DIFFERENTIAL/PLATELET
Abs Immature Granulocytes: 0.02 10*3/uL (ref 0.00–0.07)
Basophils Absolute: 0 10*3/uL (ref 0.0–0.1)
Basophils Relative: 0 %
Eosinophils Absolute: 0.1 10*3/uL (ref 0.0–0.5)
Eosinophils Relative: 1 %
HCT: 40.6 % (ref 36.0–46.0)
Hemoglobin: 14.1 g/dL (ref 12.0–15.0)
Immature Granulocytes: 0 %
Lymphocytes Relative: 29 %
Lymphs Abs: 2 10*3/uL (ref 0.7–4.0)
MCH: 31.4 pg (ref 26.0–34.0)
MCHC: 34.7 g/dL (ref 30.0–36.0)
MCV: 90.4 fL (ref 80.0–100.0)
Monocytes Absolute: 0.5 10*3/uL (ref 0.1–1.0)
Monocytes Relative: 7 %
Neutro Abs: 4.4 10*3/uL (ref 1.7–7.7)
Neutrophils Relative %: 63 %
Platelets: 282 10*3/uL (ref 150–400)
RBC: 4.49 MIL/uL (ref 3.87–5.11)
RDW: 12.8 % (ref 11.5–15.5)
WBC: 6.9 10*3/uL (ref 4.0–10.5)
nRBC: 0 % (ref 0.0–0.2)

## 2023-01-21 LAB — PREGNANCY, URINE: Preg Test, Ur: NEGATIVE

## 2023-01-21 LAB — URINALYSIS, ROUTINE W REFLEX MICROSCOPIC
Bilirubin Urine: NEGATIVE
Glucose, UA: NEGATIVE mg/dL
Hgb urine dipstick: NEGATIVE
Ketones, ur: NEGATIVE mg/dL
Leukocytes,Ua: NEGATIVE
Nitrite: NEGATIVE
Protein, ur: NEGATIVE mg/dL
Specific Gravity, Urine: 1.025 (ref 1.005–1.030)
pH: 5.5 (ref 5.0–8.0)

## 2023-01-21 LAB — LIPASE, BLOOD: Lipase: 34 U/L (ref 11–51)

## 2023-01-21 MED ORDER — IOHEXOL 300 MG/ML  SOLN
100.0000 mL | Freq: Once | INTRAMUSCULAR | Status: AC | PRN
  Administered 2023-01-21: 100 mL via INTRAVENOUS

## 2023-01-21 NOTE — ED Provider Notes (Signed)
[place] EMERGENCY DEPARTMENT AT [facility] Provider Note   CSN: [id number] Arrival date & time: 01/21/23  7829     History  Chief Complaint  Patient presents with   Rectal Bleeding    DERHONDA EASTLICK is a 32 y.o. female.  32 year old female with past medical history of IBS-C, bipolar disorder, dyslipidemia, GERD presents with concern for bright red blood per rectum and abdominal discomfort.  Reports starting Linzess for her IBS-C a few weeks ago, has been having diarrhea, and has abdominal discomfort with bowel movements.  Notes that she has known hemorrhoids and has had bright red blood per rectum, is having to wear 2 pads per day for this.  No history of anemia.  Called her GI provider however was unable to be seen and directed to the emergency room for evaluation.  No prior abdominal surgeries.  Denies fevers, chills, nausea, vomiting.  No other complaints or concerns.       Home Medications Prior to Admission medications   Medication Sig Start Date End Date Taking? Authorizing Provider  albuterol (VENTOLIN HFA) 108 (90 Base) MCG/ACT inhaler Inhale 2 puffs into the lungs every 6 (six) hours as needed for wheezing or shortness of breath. 06/30/21   Sharlene Dory, DO  alprazolam [name]) 2 MG tablet Take 1 tablet (2 mg total) by mouth at bedtime as needed. for sleep. 01/14/23   Eulis Foster, FNP  Atogepant (QULIPTA) 60 MG TABS Take 60 mg by mouth daily. 01/15/22   Everlena Cooper, Adam R, DO  clobetasol cream (TEMOVATE) 0.05 % APPLY TOPICALLY DAILY AS NEEDED. ECZEMA 12/02/18   Bradd Canary, MD  DUPIXENT 300 MG/2ML [facility]  04/07/20   [provider]  EUCRISA 2 % OINT as needed. 04/07/20   [provider]  lamoTRIgine (LAMICTAL) 200 MG tablet Take 1 tablet (200 mg total) by mouth daily. NEEDS APPT FOR REFILLS 01/16/23   Bradd Canary, MD  LINZESS 145 MCG CAPS capsule Take 145 mcg by mouth daily.    [provider]  metFORMIN (GLUCOPHAGE)  500 MG tablet Take 1 tablet (500 mg total) by mouth daily with breakfast. 06/22/22   Jerene Bears, MD  metroNIDAZOLE (METROCREAM) 0.75 % cream Apply topically 2 (two) times daily. 08/29/20   [provider]  Multiple Vitamin (MULTIVITAMIN ADULT PO) Take by mouth daily.    [provider]  nortriptyline (PAMELOR) 10 MG capsule TAKE 1 CAPSULE BY MOUTH AT BEDTIME. 10/26/22   Everlena Cooper, Adam R, DO  ondansetron (ZOFRAN-ODT) 8 MG disintegrating tablet Take 1 tablet (8 mg total) by mouth every 8 (eight) hours as needed for nausea or vomiting. 04/11/21   Olive Bass, FNP  spironolactone (ALDACTONE) 50 MG tablet Take 50 mg by mouth daily. 1 daily, 2 at [facility] 04/09/22   [provider]  tacrolimus (PROTOPIC) 0.1 % ointment Apply topically 2 (two) times daily. 10/12/20   [provider]  Ubrogepant (UBRELVY) 100 MG TABS Take 1 tablet (100 mg total) by mouth as needed. May repeat after 2 hours.  Maximum 2 tablets in 24 hours. 10/02/22   Drema Dallas, DO      Allergies    Cymbalta [duloxetine hcl]    Review of Systems   Review of Systems Negative except as per HPI Physical Exam Updated Vital Signs BP 102/70   Pulse 75   Temp 98.2 F (36.8 C)   Resp 16   Ht 5\' 4"  (1.626 m)   Wt 74.8  kg   SpO2 100%   BMI 28.32 kg/m  Physical Exam Vitals and nursing note reviewed.  Constitutional:      General: She is not in acute distress.    Appearance: She is well-developed. She is not diaphoretic.  HENT:     Head: Normocephalic and atraumatic.  Pulmonary:     Effort: Pulmonary effort is normal.  Abdominal:     Palpations: Abdomen is soft.     Tenderness: There is generalized abdominal tenderness. There is no guarding or rebound.     Comments: Mild generalized discomfort  Skin:    General: Skin is warm and dry.     Findings: No erythema or rash.  Neurological:     Mental Status: She is alert and oriented to person, place, and time.  Psychiatric:        Behavior:  Behavior normal.     ED Results / Procedures / Treatments   Labs (all labs ordered are listed, but only abnormal results are displayed) Labs Reviewed  COMPREHENSIVE METABOLIC PANEL - Abnormal; Notable for the following components:      Result Value   Glucose, Bld 102 (*)    All other components within normal limits  CBC WITH DIFFERENTIAL/PLATELET  LIPASE, BLOOD  URINALYSIS, ROUTINE W REFLEX MICROSCOPIC  PREGNANCY, URINE    EKG None  Radiology CT ABDOMEN PELVIS W CONTRAST  Result Date: 01/21/2023 CLINICAL DATA:  Abdominal pain, acute, nonlocalized. EXAM: CT ABDOMEN AND PELVIS WITH CONTRAST TECHNIQUE: Multidetector CT imaging of the abdomen and pelvis was performed using the standard protocol following bolus administration of intravenous contrast. RADIATION DOSE REDUCTION: This exam was performed according to the departmental dose-optimization program which includes automated exposure control, adjustment of the mA and/or kV according to patient size and/or use of iterative reconstruction technique. CONTRAST:  OMNIPAQUE IOHEXOL 300 MG/ML  SOLN COMPARISON:  None Available. FINDINGS: Lower chest: There are dependent atelectatic changes in the visualized lung bases. No overt consolidation. No pleural effusion. The heart is normal in size. No pericardial effusion. Hepatobiliary: The liver is normal in size. Non-cirrhotic configuration. No suspicious mass. These is mild diffuse hepatic steatosis. No intrahepatic or extrahepatic bile duct dilation. No calcified gallstones. Normal gallbladder wall thickness. No pericholecystic inflammatory changes. Pancreas: Unremarkable. No pancreatic ductal dilatation or surrounding inflammatory changes. Spleen: Within normal limits. No focal lesion. Adrenals/Urinary Tract: Adrenal glands are unremarkable. No suspicious renal mass. No hydronephrosis. No renal or ureteric calculi. Urinary bladder is under distended, precluding optimal assessment. However, no  large mass or stones identified. No perivesical fat stranding. Stomach/Bowel: No disproportionate dilation of the small or large bowel loops. No evidence of abnormal bowel wall thickening or inflammatory changes. The appendix is unremarkable. Vascular/Lymphatic: No ascites or pneumoperitoneum. No abdominal or pelvic lymphadenopathy, by size criteria. No aneurysmal dilation of the major abdominal arteries. Reproductive: Normal-size anteverted uterus. There is a well-circumscribed 5.0 x 5.7 cm hypoattenuating structure in the right adnexa, incompletely evaluated on the current exam but favored ovarian in etiology. This may represent a simple versus hemorrhagic ovarian cyst. Correlate clinically to determine the need for further characterization with pelvic ultrasound. Other: There is a tiny fat containing umbilical hernia. The soft tissues and abdominal wall are otherwise unremarkable. Musculoskeletal: No suspicious osseous lesions. There are mild multilevel degenerative changes in the visualized spine. IMPRESSION: *No acute inflammatory process identified within the abdomen or pelvis. No bowel obstruction. *There is a 5.7 cm hypoattenuating structure in the right adnexa, with imaging characteristics and follow-up recommendations  as discussed above. *Multiple other nonacute observations, as described above. Electronically Signed   By: Jules Schick M.D.   On: 01/21/2023 15:00    Procedures Procedures    Medications Ordered in ED Medications  iohexol (OMNIPAQUE) 300 MG/ML solution 100 mL (100 mLs Intravenous Contrast Given 01/21/23 1219)    ED Course/ Medical Decision Making/ A&P                                 Medical Decision Making Amount and/or Complexity of Data Reviewed Labs: ordered. Radiology: ordered.  Risk Prescription drug management.   This patient presents to the ED for concern of abdominal pain, this involves an extensive number of treatment options, and is a complaint that carries  with it a high risk of complications and morbidity.  The differential diagnosis includes but not limited to diverticulitis, colitis, Crohn's, bowel obstruction, mass, anemia, pancreatitis   Co morbidities that complicate the patient evaluation  IBS-C, bipolar, pain, dyslipidemia deficiency, irregular menstrual cycles, GERD PCOS, eczema, migraines   Additional history obtained:  External records from outside source obtained and reviewed including recent visit to GI dated 01/01/2023, started on Linzess   Lab Tests:  I Ordered, and personally interpreted labs.  The pertinent results include: CBC within normal notes.  Lipase normal.  Urinalysis is normal.  CMP unremarkable. Upreg negative    Imaging Studies ordered:  I ordered imaging studies including CT abdomen/pelvis  I independently visualized and interpreted imaging which showed large ovarian cyst I agree with the radiologist interpretation   Problem List / ED Course / Critical interventions / Medication management  32 year old female with abdominal pain with diarrhea and bright red blood per rectum after starting Linzess. Mild generalized discomfort. Labs reassuring. Rectal deferred. CT with large right likely ovarian cyst. Plan is to follow up with her GI for further guidance on her medication. Follow up with her GYN regarding the cyst. Return to the ER as needed.  I have reviewed the patients home medicines and have made adjustments as needed   Social Determinants of Health:  Has PCP and specialty care team    Test / Admission - Considered:  Consider pelvic US, symptoms not consistent with torsion. Recommend follow up with Dr. Hyacinth Meeker (pt's GYN) for recheck. Return to ER as needed. See GI for further work up on rectal bleeding, vitals and H&H stable          Final Clinical Impression(s) / ED Diagnoses Final diagnoses:  Rectal bleeding  Cyst of right ovary    Rx / DC Orders ED Discharge Orders     None          Jeannie Fend, PA-C 01/21/23 1536    Laurence Spates, MD 01/21/23 1956

## 2023-01-21 NOTE — Discharge Instructions (Signed)
Follow up with your GI to discuss rectal bleeding and medication. Follow up with your GYN for large ovarian cyst. Return to the ER for worsening or concerning symptoms.

## 2023-01-21 NOTE — ED Notes (Signed)
Report rec'd from prev RN 

## 2023-01-21 NOTE — ED Triage Notes (Addendum)
Pt reports that she just started taking Linzess for her constipation. Reports that this weekend she began having some rectal bleeding. Reports that she is having some pain with it. Pt reports that she is also having diarrhea.

## 2023-01-22 ENCOUNTER — Other Ambulatory Visit (HOSPITAL_COMMUNITY): Payer: Self-pay

## 2023-01-22 NOTE — Telephone Encounter (Signed)
Pharmacy Patient Advocate Encounter  Received notification from [facility]  that Prior Authorization for Bernita Raisin has been APPROVED from 01/18/23 to 01/18/24 However 10 tablets were over $90, even with an evoucher, per test claim. I can't tell if the pt has a high deductible plan, or if the med is just on a tier where the cost is a coinsurance (percentage) vs a coapy.

## 2023-01-23 ENCOUNTER — Encounter (HOSPITAL_BASED_OUTPATIENT_CLINIC_OR_DEPARTMENT_OTHER): Payer: Self-pay | Admitting: Obstetrics & Gynecology

## 2023-01-23 ENCOUNTER — Ambulatory Visit (HOSPITAL_BASED_OUTPATIENT_CLINIC_OR_DEPARTMENT_OTHER): Payer: Federal, State, Local not specified - PPO | Admitting: Obstetrics & Gynecology

## 2023-01-23 VITALS — BP 102/63 | HR 76 | Ht 64.0 in | Wt 163.2 lb

## 2023-01-23 DIAGNOSIS — Z8742 Personal history of other diseases of the female genital tract: Secondary | ICD-10-CM | POA: Diagnosis not present

## 2023-01-23 DIAGNOSIS — R102 Pelvic and perineal pain: Secondary | ICD-10-CM | POA: Diagnosis not present

## 2023-01-23 DIAGNOSIS — N83201 Unspecified ovarian cyst, right side: Secondary | ICD-10-CM

## 2023-01-23 DIAGNOSIS — Z9889 Other specified postprocedural states: Secondary | ICD-10-CM | POA: Diagnosis not present

## 2023-01-23 DIAGNOSIS — G8929 Other chronic pain: Secondary | ICD-10-CM

## 2023-01-23 MED ORDER — CELECOXIB 200 MG PO CAPS: 200.0000 mg | ORAL_CAPSULE | Freq: Every day | ORAL | 1 refills | Status: DC

## 2023-01-23 MED ORDER — GABAPENTIN 100 MG PO CAPS: ORAL_CAPSULE | ORAL | 1 refills | Status: DC

## 2023-01-23 NOTE — Progress Notes (Signed)
GYNECOLOGY  VISIT  CC:   ER follow up  HPI: 32 y.o. G87P1011 Married Other or two or more races female here for follow up after ER where she was seen for abdominal pain.  CT done 11/4 showing 5.0 x 5.7cm right adnexal cyst possibly simple vs hemorrhagic.  She has experienced recurrent issues with RLQ and she states several times during our visit that she is "over it".  Having pain with intercourse as well.  Has undergone endometrial ablation for bleeding and although this helped bleeding, she has experienced intermittent pelvic pain that is just gradually worsening.  She is really tired of it and is desirous of hysterectomy and possibly right ovary removal.  Discussed risks of ovary removal, including earlier menopause.  She states she willing to take this risk if she could have improved pain.  Discussed very seriously and honestly with pt that hysterectomy could end up not solving issue and that I would high recommend starting PT.  She did this x 1 in the past but did not continue due to cost.  She is clearly aware pain could continue and is willing to take that risk.  Briefly, surgical risks discussed.  She would like this done before the end of the year if possible.   Past Medical History:  Diagnosis Date   Acne    Asthma due to environmental allergies    per pt moderate, prn inhaler,  followed by pcp   Bipolar 1 disorder (HCC)    Chronic back pain    Chronic pelvic pain in female    Dyslipidemia 02/18/2013   Eczema    GAD (generalized anxiety disorder)    GERD (gastroesophageal reflux disease)    History of abnormal cervical Pap smear    History of chlamydia 2015   History of gestational diabetes    Irregular periods/menstrual cycles    Migraines    PCOS (polycystic ovarian syndrome)    Vitamin D deficiency    Wears glasses     MEDS:   Current Outpatient Medications on File Prior to Visit  Medication Sig Dispense Refill   albuterol (VENTOLIN HFA) 108 (90 Base) MCG/ACT inhaler  Inhale 2 puffs into the lungs every 6 (six) hours as needed for wheezing or shortness of breath. 18 g 2   alprazolam (XANAX) 2 MG tablet Take 1 tablet (2 mg total) by mouth at bedtime as needed. for sleep. 30 tablet 1   clobetasol cream (TEMOVATE) 0.05 % APPLY TOPICALLY DAILY AS NEEDED. ECZEMA 60 g 3   EUCRISA 2 % OINT as needed.     lamoTRIgine (LAMICTAL) 200 MG tablet Take 1 tablet (200 mg total) by mouth daily. NEEDS APPT FOR REFILLS 90 tablet 0   LINZESS 145 MCG CAPS capsule Take 145 mcg by mouth daily.     metFORMIN (GLUCOPHAGE) 500 MG tablet Take 1 tablet (500 mg total) by mouth daily with breakfast. 90 tablet 3   metroNIDAZOLE (METROCREAM) 0.75 % cream Apply topically 2 (two) times daily.     Multiple Vitamin (MULTIVITAMIN ADULT PO) Take by mouth daily.     nortriptyline (PAMELOR) 10 MG capsule TAKE 1 CAPSULE BY MOUTH AT BEDTIME. 90 capsule 2   ondansetron (ZOFRAN-ODT) 8 MG disintegrating tablet Take 1 tablet (8 mg total) by mouth every 8 (eight) hours as needed for nausea or vomiting. 20 tablet 0   spironolactone (ALDACTONE) 50 MG tablet Take 50 mg by mouth daily. 1 daily, 2 at HS     tacrolimus (PROTOPIC) 0.1 %  ointment Apply topically 2 (two) times daily.     Ubrogepant (UBRELVY) 100 MG TABS Take 1 tablet (100 mg total) by mouth as needed. May repeat after 2 hours.  Maximum 2 tablets in 24 hours. 10 tablet 11   No current facility-administered medications on file prior to visit.    ALLERGIES: Cymbalta [duloxetine hcl]  SH:  married, non smoker  Review of Systems  Constitutional: Negative.   Genitourinary: Negative.     PHYSICAL EXAMINATION:    BP 102/63 (BP Location: Right Arm, Patient Position: Sitting, Cuff Size: Large)   Pulse 76   Ht 5\' 4"  (1.626 m) Comment: Reported  Wt 163 lb 3.2 oz (74 kg)   BMI 28.01 kg/m     Physical Exam Constitutional:      Appearance: Normal appearance.  Neurological:     General: No focal deficit present.  Psychiatric:        Mood and  Affect: Mood normal.        Behavior: Behavior normal.     Assessment/Plan: 1. Chronic pelvic pain in female - will start medications to hopefully help with pain prior to getting procedure scheduled - celecoxib (CELEBREX) 200 MG capsule; Take 1 capsule (200 mg total) by mouth daily.  Dispense: 30 capsule; Refill: 1 - Ambulatory referral to Physical Therapy - gabapentin (NEURONTIN) 100 MG capsule; Take 1 capsule nightly x 3 nights and then increase to 2 capsules nightly x 3 nights.  Then increase to 3 capsules.  Dispense: 60 capsule; Refill: 1 - Ambulatory Referral For Surgery Scheduling  2. History of menorrhagia  3. History of endometrial ablation  4. Right ovarian cyst - will need pre-op ultrasound scheduled as well  Total time with pt is 26 minutes Total time with documentation 6 mintues Total time:  32 minutes

## 2023-01-26 DIAGNOSIS — G8929 Other chronic pain: Secondary | ICD-10-CM | POA: Insufficient documentation

## 2023-01-26 DIAGNOSIS — Z9889 Other specified postprocedural states: Secondary | ICD-10-CM | POA: Insufficient documentation

## 2023-02-19 ENCOUNTER — Telehealth: Payer: Self-pay

## 2023-02-19 ENCOUNTER — Encounter (HOSPITAL_BASED_OUTPATIENT_CLINIC_OR_DEPARTMENT_OTHER): Payer: Self-pay

## 2023-02-19 NOTE — Telephone Encounter (Signed)
Called patient to schedule surgery w/ Dr. Hyacinth Meeker. Patient chose to have procedure on 04/09/23 @[facility]  at 7:30 am. Patient was provided pre-op instructions over the phone and is aware she must arrive at 5:30 am. Patient confirmed all surgery details will be sent to her mychart once surgery is scheduled.

## 2023-02-20 ENCOUNTER — Ambulatory Visit (HOSPITAL_BASED_OUTPATIENT_CLINIC_OR_DEPARTMENT_OTHER): Payer: Federal, State, Local not specified - PPO | Admitting: Obstetrics & Gynecology

## 2023-02-20 ENCOUNTER — Ambulatory Visit (HOSPITAL_BASED_OUTPATIENT_CLINIC_OR_DEPARTMENT_OTHER): Payer: Federal, State, Local not specified - PPO

## 2023-02-20 VITALS — BP 100/77 | HR 86 | Ht 64.0 in | Wt 167.8 lb

## 2023-02-20 DIAGNOSIS — R102 Pelvic and perineal pain unspecified side: Secondary | ICD-10-CM

## 2023-02-20 DIAGNOSIS — N946 Dysmenorrhea, unspecified: Secondary | ICD-10-CM | POA: Diagnosis not present

## 2023-02-20 DIAGNOSIS — N83201 Unspecified ovarian cyst, right side: Secondary | ICD-10-CM | POA: Diagnosis not present

## 2023-02-20 DIAGNOSIS — Z8742 Personal history of other diseases of the female genital tract: Secondary | ICD-10-CM | POA: Diagnosis not present

## 2023-02-20 DIAGNOSIS — G8929 Other chronic pain: Secondary | ICD-10-CM

## 2023-02-23 ENCOUNTER — Encounter (HOSPITAL_BASED_OUTPATIENT_CLINIC_OR_DEPARTMENT_OTHER): Payer: Self-pay | Admitting: Obstetrics & Gynecology

## 2023-02-23 NOTE — Progress Notes (Signed)
GYNECOLOGY  VISIT  CC:   Discuss ultrasound results, dysmenorrhea, pelvic pain  HPI: 32 y.o. G47P1011 Married Other or two or more races female here for discussion of ultrasound results.  Ultrasound obtained due to dysmenorrhea and pelvic pain.  Uterus 6.6 x 3.7 x 3.6cm with endometrium 2.20mm.  Ovaries normal.  She is just ready for this all to stop and desires more definitive place.  She is scheduled not for January.  Pelvic PT was recommended she she has been able to schedule but not until February.  She is aware this could help decrease pain.  She is ok if she ends up needing this post op.  She is also having some dyspareunia so will just need to see how much resolution she has with surgery before deciding what comes next..    Procedure discussed with patient.  Hospital stay, recovery and pain management all discussed.  Risks discussed including but not limited to bleeding, 1% risk of receiving a  transfusion, infection, 3-4% risk of bowel/bladder/ureteral/vascular injury discussed as well as possible need for additional surgery if injury does occur discussed.  DVT/PE and rare risk of death discussed.  My actual complications with prior surgeries discussed.  Vaginal cuff dehiscence discussed.  Hernia formation discussed.  Positioning and incision locations discussed.  Patient aware if pathology abnormal she may need additional treatment.  All questions answered.   .   Past Medical History:  Diagnosis Date   Acne    Asthma due to environmental allergies    per pt moderate, prn inhaler,  followed by pcp   Bipolar 1 disorder (HCC)    Chronic back pain    Chronic pelvic pain in female    Dyslipidemia 02/18/2013   Eczema    GAD (generalized anxiety disorder)    GERD (gastroesophageal reflux disease)    History of abnormal cervical Pap smear    History of chlamydia 2015   History of gestational diabetes    Irregular periods/menstrual cycles    Migraines    PCOS (polycystic ovarian syndrome)     Vitamin D deficiency    Wears glasses     MEDS:   Current Outpatient Medications on File Prior to Visit  Medication Sig Dispense Refill   albuterol (VENTOLIN HFA) 108 (90 Base) MCG/ACT inhaler Inhale 2 puffs into the lungs every 6 (six) hours as needed for wheezing or shortness of breath. 18 g 2   alprazolam (XANAX) 2 MG tablet Take 1 tablet (2 mg total) by mouth at bedtime as needed. for sleep. 30 tablet 1   celecoxib (CELEBREX) 200 MG capsule Take 1 capsule (200 mg total) by mouth daily. 30 capsule 1   clobetasol cream (TEMOVATE) 0.05 % APPLY TOPICALLY DAILY AS NEEDED. ECZEMA 60 g 3   EUCRISA 2 % OINT as needed.     gabapentin (NEURONTIN) 100 MG capsule Take 1 capsule nightly x 3 nights and then increase to 2 capsules nightly x 3 nights.  Then increase to 3 capsules. 60 capsule 1   lamoTRIgine (LAMICTAL) 200 MG tablet Take 1 tablet (200 mg total) by mouth daily. NEEDS APPT FOR REFILLS 90 tablet 0   LINZESS 145 MCG CAPS capsule Take 145 mcg by mouth daily.     metFORMIN (GLUCOPHAGE) 500 MG tablet Take 1 tablet (500 mg total) by mouth daily with breakfast. 90 tablet 3   metroNIDAZOLE (METROCREAM) 0.75 % cream Apply topically 2 (two) times daily.     Multiple Vitamin (MULTIVITAMIN ADULT PO) Take by mouth  daily.     nortriptyline (PAMELOR) 10 MG capsule TAKE 1 CAPSULE BY MOUTH AT BEDTIME. 90 capsule 2   ondansetron (ZOFRAN-ODT) 8 MG disintegrating tablet Take 1 tablet (8 mg total) by mouth every 8 (eight) hours as needed for nausea or vomiting. 20 tablet 0   spironolactone (ALDACTONE) 50 MG tablet Take 50 mg by mouth daily. 1 daily, 2 at HS     tacrolimus (PROTOPIC) 0.1 % ointment Apply topically 2 (two) times daily.     Ubrogepant (UBRELVY) 100 MG TABS Take 1 tablet (100 mg total) by mouth as needed. May repeat after 2 hours.  Maximum 2 tablets in 24 hours. 10 tablet 11   No current facility-administered medications on file prior to visit.    ALLERGIES: Cymbalta [duloxetine hcl]  SH:   married, non smoker  Review of Systems  Constitutional: Negative.   Genitourinary:        Dysmenorrhea, pelvic pain    PHYSICAL EXAMINATION:    BP 100/77 (BP Location: Left Arm, Patient Position: Sitting, Cuff Size: Normal)   Pulse 86   Ht 5\' 4"  (1.626 m)   Wt 167 lb 12.8 oz (76.1 kg)   BMI 28.80 kg/m      Physical Exam Constitutional:      Appearance: Normal appearance.  Neurological:     General: No focal deficit present.     Mental Status: She is alert.  Psychiatric:        Mood and Affect: Mood normal.     Assessment/Plan: 1. Dysmenorrhea - pt is already scheduled for TLH/bilateral salpingectomy/cystoscopy.  She will likely need to change appt for pelvic PT but can communicate with them after surgery is done.  She is willing to go to pelvic PT as well post op.  2. Pelvic pain  Time with pt:  28 minutes Documentation;  6 minutes Total time: 34 minutes

## 2023-03-18 ENCOUNTER — Ambulatory Visit: Payer: Federal, State, Local not specified - PPO | Admitting: Neurology

## 2023-03-28 ENCOUNTER — Encounter (HOSPITAL_BASED_OUTPATIENT_CLINIC_OR_DEPARTMENT_OTHER): Payer: Self-pay | Admitting: Obstetrics & Gynecology

## 2023-03-28 NOTE — Progress Notes (Addendum)
 Your procedure is scheduled on  :  Tuesday,  04-09-2023  Report to [facility] AT  _5:30__ AM.   Call this number if you have problems the morning of surgery  :[phone]. Any questions prior to surgery call pre-op nurse,  Loyde Orth :  [phone]   OUR ADDRESS IS [address].  WE ARE LOCATED IN THE [facility]  PLEASE BRING YOUR INSURANCE CARD AND PHOTO ID DAY OF SURGERY.                                     REMEMBER: Do not eat food after midnight night before surgery.  You may have clear liquid from midnight night before surgery until 4:30 AM.   NO clear liquids after 4:30 AM day of surgery.  This includes no water ,  candy,  gum,  and mints.   Please brush your teeth morning of surgery and rinse mouth out.    CLEAR LIQUID DIET  Allowed      Water                                                                    Coffee and tea, regular and decaf  (NO cream or milk products of any type, may sweeten, no honey)                         Carbonated beverages, regular and diet                                    Sports drinks like Gatorade _____________________________________________________________________     TAKE ONLY THESE MEDICATIONS MORNING OF SURGERY:     Linzess  Please bring your Albuterol  (ventolin  ) inhaler with you day of surgery                                       DO NOT WEAR JEWERLY/  METAL/  PIERCINGS (INCLUDING NO PLASTIC PIERCINGS) DO NOT WEAR LOTIONS, POWDERS, PERFUMES OR NAIL POLISH ON YOUR FINGERNAILS. TOENAIL POLISH IS OK TO WEAR. DO NOT SHAVE FOR 48 HOURS PRIOR TO DAY OF SURGERY.  CONTACTS, GLASSES, OR DENTURES MAY NOT BE WORN TO SURGERY.  REMEMBER: NO SMOKING, VAPING ,  DRUGS OR ALCOHOL FOR 24 HOURS BEFORE YOUR SURGERY.                                    [facility] IS NOT RESPONSIBLE  FOR ANY BELONGINGS.                                                                    [facility]  [place] -  Preparing for Surgery Before surgery, you can play an important role.  Because skin is not sterile, your skin needs to be as free of germs as possible.  You can reduce the number of germs on your skin by washing with CHG (chlorahexidine gluconate) soap before surgery.  CHG is an antiseptic cleaner which kills germs and bonds with the skin to continue killing germs even after washing. Please DO NOT use if you have an allergy to CHG or antibacterial soaps.  If your skin becomes reddened/irritated stop using the CHG and inform your nurse when you arrive at Short Stay. Do not shave (including legs and underarms) for at least 48 hours prior to the first CHG shower.  You may shave your face/neck. Please follow these instructions carefully:  1.  Shower with CHG Soap the night before surgery and the  morning of Surgery.  2.  If you choose to wash your hair, wash your hair first as usual with your  normal  shampoo.  3.  After you shampoo, rinse your hair and body thoroughly to remove the  shampoo.                                        4.  Use CHG as you would any other liquid soap.  You can apply chg directly  to the skin and wash , chg soap provided, night before and morning of your surgery.  5.  Apply the CHG Soap to your body ONLY FROM THE NECK DOWN.   Do not use on face/ open                           Wound or open sores. Avoid contact with eyes, ears mouth and genitals (private parts).                       Wash face,  Genitals (private parts) with your normal soap.             6.  Wash thoroughly, paying special attention to the area where your surgery  will be performed.  7.  Thoroughly rinse your body with warm water  from the neck down.  8.  DO NOT shower/wash with your normal soap after using and rinsing off  the CHG Soap.             9.  Pat yourself dry with a clean towel.            10.  Wear clean pajamas.            11.  Place clean sheets on your bed the night of your first shower and do not   sleep with pets. Day of Surgery : Do not apply any lotions/ powders the morning of surgery.  Please wear clean clothes to the hospital/surgery center.  IF YOU HAVE ANY SKIN IRRITATION OR PROBLEMS WITH THE SURGICAL SOAP, PLEASE GET A BAR OF GOLD DIAL SOAP AND SHOWER THE NIGHT BEFORE YOUR SURGERY AND THE MORNING OF YOUR SURGERY. PLEASE LET THE NURSE KNOW MORNING OF YOUR SURGERY IF YOU HAD ANY PROBLEMS WITH THE SURGICAL SOAP.   YOUR SURGEON MAY HAVE REQUESTED EXTENDED RECOVERY TIME AFTER YOUR SURGERY. IT COULD BE A  JUST A FEW HOURS  UP TO AN OVERNIGHT STAY.  YOUR SURGEON SHOULD HAVE  DISCUSSED THIS WITH YOU PRIOR TO YOUR SURGERY. IN THE EVENT YOU NEED TO STAY OVERNIGHT PLEASE REFER TO THE FOLLOWING GUIDELINES. YOU MAY HAVE UP TO 4 VISITORS  MAY VISIT IN THE EXTENDED RECOVERY ROOM UNTIL 800 PM ONLY.  ONE  VISITOR AGE 64 AND OVER MAY SPEND THE NIGHT AND MUST BE IN EXTENDED RECOVERY ROOM NO LATER THAN 800 PM . YOUR DISCHARGE TIME AFTER YOU SPEND THE NIGHT IS 900 AM THE MORNING AFTER YOUR SURGERY. YOU MAY PACK A SMALL OVERNIGHT BAG WITH TOILETRIES FOR YOUR OVERNIGHT STAY IF YOU WISH.  REGARDLESS OF IF YOU STAY OVER NIGHT OR ARE DISCHARGED THE SAME DAY YOU WILL BE REQUIRED TO HAVE A RESPONSIBLE ADULT (18 YRS OLD OR OLDER) STAY WITH YOU FOR AT LEAST THE FIRST 24 HOURS WHEN HOME  YOUR PRESCRIPTION MEDICATIONS WILL BE PROVIDED DURING [facility] STAY.  ________________________________________________________________________

## 2023-03-28 NOTE — Progress Notes (Addendum)
 Spoke w/ via phone for pre-op interview--- pt Lab needs dos----   urine preg      Lab results------ lab appt on 04-02-2023 @ 0900 getting CBC/ T&S COVID test -----patient states asymptomatic no test needed Arrive at ------- 0530 on 04-09-2023 NPO after MN NO Solid Food.  Clear liquids from MN until--- 0430 Med rec completed Medications to take morning of surgery ----- linzess  Diabetic medication ----- n/a Patient instructed no nail polish to be worn day of surgery Patient instructed to bring photo id and insurance card day of surgery Patient aware to have Driver (ride ) / caregiver    for 24 hours after surgery - husband, Jillian Reyes Patient Special Instructions ----- will pick up bag w/ hibiclens  and written instructions at lab appt. Asked to call with any questions Pre-Op special Instructions ----- n/a Patient verbalized understanding of instructions that were given at this phone interview. Patient denies chest pain, sob, fever, cough at the interview.

## 2023-04-01 ENCOUNTER — Other Ambulatory Visit (HOSPITAL_BASED_OUTPATIENT_CLINIC_OR_DEPARTMENT_OTHER): Payer: Self-pay | Admitting: Obstetrics & Gynecology

## 2023-04-01 ENCOUNTER — Encounter (HOSPITAL_COMMUNITY): Admission: RE | Admit: 2023-04-01 | Payer: Federal, State, Local not specified - PPO | Source: Ambulatory Visit

## 2023-04-01 DIAGNOSIS — Z01812 Encounter for preprocedural laboratory examination: Secondary | ICD-10-CM

## 2023-04-01 DIAGNOSIS — R102 Pelvic and perineal pain unspecified side: Secondary | ICD-10-CM

## 2023-04-02 ENCOUNTER — Encounter (HOSPITAL_COMMUNITY)
Admission: RE | Admit: 2023-04-02 | Discharge: 2023-04-02 | Disposition: A | Discharge status: Home / Self Care | Payer: Federal, State, Local not specified - PPO | Source: Ambulatory Visit | Attending: Obstetrics & Gynecology | Admitting: Obstetrics & Gynecology

## 2023-04-02 DIAGNOSIS — R102 Pelvic and perineal pain: Secondary | ICD-10-CM | POA: Insufficient documentation

## 2023-04-02 DIAGNOSIS — Z01812 Encounter for preprocedural laboratory examination: Secondary | ICD-10-CM | POA: Diagnosis not present

## 2023-04-02 LAB — CBC
HCT: 42.5 % (ref 36.0–46.0)
Hemoglobin: 14.1 g/dL (ref 12.0–15.0)
MCH: 31.1 pg (ref 26.0–34.0)
MCHC: 33.2 g/dL (ref 30.0–36.0)
MCV: 93.6 fL (ref 80.0–100.0)
Platelets: 290 10*3/uL (ref 150–400)
RBC: 4.54 MIL/uL (ref 3.87–5.11)
RDW: 12.5 % (ref 11.5–15.5)
WBC: 7.3 10*3/uL (ref 4.0–10.5)
nRBC: 0 % (ref 0.0–0.2)

## 2023-04-08 NOTE — Anesthesia Preprocedure Evaluation (Signed)
Anesthesia Evaluation  Patient identified by MRN, date of birth, ID band Patient awake    Reviewed: Allergy & Precautions, H&P , NPO status , Patient's Chart, lab work & pertinent test results  Airway Mallampati: II  TM Distance: >3 FB Neck ROM: Full    Dental no notable dental hx. (+) Teeth Intact, Dental Advisory Given   Pulmonary asthma , former smoker   Pulmonary exam normal breath sounds clear to auscultation       Cardiovascular Exercise Tolerance: Good negative cardio ROS  Rhythm:Regular Rate:Normal     Neuro/Psych  Headaches  Anxiety  Bipolar Disorder      GI/Hepatic negative GI ROS, Neg liver ROS,,,  Endo/Other  negative endocrine ROS    Renal/GU negative Renal ROS  negative genitourinary   Musculoskeletal   Abdominal   Peds  Hematology negative hematology ROS (+)   Anesthesia Other Findings   Reproductive/Obstetrics negative OB ROS                             Anesthesia Physical Anesthesia Plan  ASA: 2  Anesthesia Plan: General   Post-op Pain Management: Tylenol PO (pre-op)* and Toradol IV (intra-op)*   Induction: Intravenous  PONV Risk Score and Plan: 4 or greater and Ondansetron, Dexamethasone, Midazolam and Scopolamine patch - Pre-op  Airway Management Planned: Oral ETT  Additional Equipment:   Intra-op Plan:   Post-operative Plan: Extubation in OR  Informed Consent: I have reviewed the patients History and Physical, chart, labs and discussed the procedure including the risks, benefits and alternatives for the proposed anesthesia with the patient or authorized representative who has indicated his/her understanding and acceptance.     Dental advisory given  Plan Discussed with: CRNA  Anesthesia Plan Comments:        Anesthesia Quick Evaluation

## 2023-04-09 ENCOUNTER — Encounter (HOSPITAL_BASED_OUTPATIENT_CLINIC_OR_DEPARTMENT_OTHER)
Admission: RE | Disposition: A | Discharge status: Home / Self Care | Payer: Self-pay | Source: Home / Self Care | Attending: Obstetrics & Gynecology

## 2023-04-09 ENCOUNTER — Encounter (HOSPITAL_BASED_OUTPATIENT_CLINIC_OR_DEPARTMENT_OTHER): Payer: Self-pay | Admitting: Obstetrics & Gynecology

## 2023-04-09 ENCOUNTER — Ambulatory Visit (HOSPITAL_BASED_OUTPATIENT_CLINIC_OR_DEPARTMENT_OTHER): Payer: Self-pay | Admitting: Anesthesiology

## 2023-04-09 ENCOUNTER — Ambulatory Visit (HOSPITAL_BASED_OUTPATIENT_CLINIC_OR_DEPARTMENT_OTHER)
Admission: RE | Admit: 2023-04-09 | Discharge: 2023-04-09 | Disposition: A | Discharge status: Home / Self Care | Payer: Federal, State, Local not specified - PPO | Attending: Obstetrics & Gynecology | Admitting: Obstetrics & Gynecology

## 2023-04-09 ENCOUNTER — Other Ambulatory Visit (HOSPITAL_COMMUNITY): Payer: Self-pay

## 2023-04-09 ENCOUNTER — Other Ambulatory Visit (HOSPITAL_BASED_OUTPATIENT_CLINIC_OR_DEPARTMENT_OTHER): Payer: Self-pay | Admitting: Obstetrics & Gynecology

## 2023-04-09 ENCOUNTER — Other Ambulatory Visit: Payer: Self-pay

## 2023-04-09 DIAGNOSIS — Z01818 Encounter for other preprocedural examination: Secondary | ICD-10-CM

## 2023-04-09 DIAGNOSIS — N946 Dysmenorrhea, unspecified: Secondary | ICD-10-CM | POA: Diagnosis not present

## 2023-04-09 DIAGNOSIS — N83201 Unspecified ovarian cyst, right side: Secondary | ICD-10-CM | POA: Diagnosis not present

## 2023-04-09 DIAGNOSIS — R102 Pelvic and perineal pain unspecified side: Secondary | ICD-10-CM | POA: Diagnosis present

## 2023-04-09 DIAGNOSIS — F411 Generalized anxiety disorder: Secondary | ICD-10-CM | POA: Insufficient documentation

## 2023-04-09 DIAGNOSIS — N9985 Post endometrial ablation syndrome: Secondary | ICD-10-CM

## 2023-04-09 DIAGNOSIS — G8929 Other chronic pain: Secondary | ICD-10-CM | POA: Diagnosis not present

## 2023-04-09 DIAGNOSIS — N838 Other noninflammatory disorders of ovary, fallopian tube and broad ligament: Secondary | ICD-10-CM | POA: Diagnosis not present

## 2023-04-09 DIAGNOSIS — Z01812 Encounter for preprocedural laboratory examination: Secondary | ICD-10-CM

## 2023-04-09 DIAGNOSIS — Z9889 Other specified postprocedural states: Secondary | ICD-10-CM

## 2023-04-09 DIAGNOSIS — F319 Bipolar disorder, unspecified: Secondary | ICD-10-CM | POA: Diagnosis not present

## 2023-04-09 DIAGNOSIS — N888 Other specified noninflammatory disorders of cervix uteri: Secondary | ICD-10-CM | POA: Diagnosis not present

## 2023-04-09 DIAGNOSIS — Z87891 Personal history of nicotine dependence: Secondary | ICD-10-CM | POA: Insufficient documentation

## 2023-04-09 DIAGNOSIS — G8918 Other acute postprocedural pain: Secondary | ICD-10-CM

## 2023-04-09 HISTORY — DX: Irritable bowel syndrome with constipation: K58.1

## 2023-04-09 HISTORY — DX: Obstructive sleep apnea (adult) (pediatric): G47.33

## 2023-04-09 HISTORY — PX: TOTAL LAPAROSCOPIC HYSTERECTOMY WITH BILATERAL SALPINGO OOPHORECTOMY: SHX6845

## 2023-04-09 HISTORY — PX: CYSTOSCOPY: SHX5120

## 2023-04-09 HISTORY — DX: Migraine without aura, not intractable, without status migrainosus: G43.009

## 2023-04-09 HISTORY — DX: Mild intermittent asthma, uncomplicated: J45.20

## 2023-04-09 HISTORY — DX: Chronic tension-type headache, intractable: G44.221

## 2023-04-09 HISTORY — DX: Unspecified ovarian cyst, right side: N83.201

## 2023-04-09 LAB — TYPE AND SCREEN
ABO/RH(D): B POS
Antibody Screen: NEGATIVE

## 2023-04-09 LAB — POCT PREGNANCY, URINE: Preg Test, Ur: NEGATIVE

## 2023-04-09 SURGERY — HYSTERECTOMY, TOTAL, LAPAROSCOPIC, WITH BILATERAL SALPINGO-OOPHORECTOMY
Anesthesia: General | Site: Bladder

## 2023-04-09 MED ORDER — SODIUM CHLORIDE 0.9 % IR SOLN
Status: DC | PRN
  Administered 2023-04-09: 1000 mL via INTRAVESICAL
  Administered 2023-04-09: 1000 mL

## 2023-04-09 MED ORDER — SUGAMMADEX SODIUM 200 MG/2ML IV SOLN
INTRAVENOUS | Status: DC | PRN
  Administered 2023-04-09: 200 mg via INTRAVENOUS

## 2023-04-09 MED ORDER — HYDROMORPHONE HCL 1 MG/ML IJ SOLN
0.2500 mg | INTRAMUSCULAR | Status: DC | PRN
  Administered 2023-04-09: 0.25 mg via INTRAVENOUS

## 2023-04-09 MED ORDER — SODIUM CHLORIDE 0.9 % IV SOLN
2.0000 g | INTRAVENOUS | Status: AC
  Administered 2023-04-09: 2 g via INTRAVENOUS

## 2023-04-09 MED ORDER — HEMOSTATIC AGENTS (NO CHARGE) OPTIME
TOPICAL | Status: DC | PRN
  Administered 2023-04-09: 1 via TOPICAL

## 2023-04-09 MED ORDER — PANTOPRAZOLE SODIUM 40 MG IV SOLR: 40.0000 mg | Freq: Every day | INTRAVENOUS | Status: DC

## 2023-04-09 MED ORDER — ONDANSETRON HCL 4 MG/2ML IJ SOLN
INTRAMUSCULAR | Status: DC | PRN
  Administered 2023-04-09: 4 mg via INTRAVENOUS

## 2023-04-09 MED ORDER — FENTANYL CITRATE (PF) 100 MCG/2ML IJ SOLN
INTRAMUSCULAR | Status: DC | PRN
  Administered 2023-04-09 (×3): 50 ug via INTRAVENOUS
  Administered 2023-04-09: 100 ug via INTRAVENOUS

## 2023-04-09 MED ORDER — PROPOFOL 10 MG/ML IV BOLUS
INTRAVENOUS | Status: AC
  Filled 2023-04-09: qty 20

## 2023-04-09 MED ORDER — PROPOFOL 10 MG/ML IV BOLUS
INTRAVENOUS | Status: DC | PRN
  Administered 2023-04-09: 60 mg via INTRAVENOUS
  Administered 2023-04-09: 140 mg via INTRAVENOUS

## 2023-04-09 MED ORDER — MIDAZOLAM HCL 5 MG/5ML IJ SOLN
INTRAMUSCULAR | Status: DC | PRN
  Administered 2023-04-09: 2 mg via INTRAVENOUS

## 2023-04-09 MED ORDER — LIDOCAINE HCL (CARDIAC) PF 100 MG/5ML IV SOSY
PREFILLED_SYRINGE | INTRAVENOUS | Status: DC | PRN
  Administered 2023-04-09: 60 mg via INTRAVENOUS

## 2023-04-09 MED ORDER — OXYCODONE-ACETAMINOPHEN 5-325 MG PO TABS
1.0000 | ORAL_TABLET | ORAL | Status: DC | PRN
  Administered 2023-04-09: 2 via ORAL

## 2023-04-09 MED ORDER — EPHEDRINE 5 MG/ML INJ
INTRAVENOUS | Status: AC
  Filled 2023-04-09: qty 5

## 2023-04-09 MED ORDER — HYDROMORPHONE HCL 1 MG/ML IJ SOLN
INTRAMUSCULAR | Status: AC
  Filled 2023-04-09: qty 1

## 2023-04-09 MED ORDER — LACTATED RINGERS IV SOLN
INTRAVENOUS | Status: DC
Start: 2023-04-09 — End: 2023-04-09

## 2023-04-09 MED ORDER — DEXAMETHASONE SODIUM PHOSPHATE 10 MG/ML IJ SOLN
INTRAMUSCULAR | Status: AC
  Filled 2023-04-09: qty 1

## 2023-04-09 MED ORDER — IBUPROFEN 800 MG PO TABS
800.0000 mg | ORAL_TABLET | Freq: Three times a day (TID) | ORAL | 0 refills | Status: DC | PRN
  Filled 2023-04-09: qty 30, 10d supply, fill #0

## 2023-04-09 MED ORDER — SODIUM CHLORIDE 0.9 % IV SOLN
INTRAVENOUS | Status: DC | PRN
  Administered 2023-04-09: 60 mL

## 2023-04-09 MED ORDER — IBUPROFEN 200 MG PO TABS: 600.0000 mg | ORAL_TABLET | Freq: Four times a day (QID) | ORAL | Status: DC

## 2023-04-09 MED ORDER — STERILE WATER FOR IRRIGATION IR SOLN
Status: DC | PRN
  Administered 2023-04-09: 500 mL

## 2023-04-09 MED ORDER — GABAPENTIN 100 MG PO CAPS
100.0000 mg | ORAL_CAPSULE | ORAL | Status: AC
  Administered 2023-04-09: 100 mg via ORAL

## 2023-04-09 MED ORDER — ONDANSETRON HCL 4 MG/2ML IJ SOLN
INTRAMUSCULAR | Status: AC
  Filled 2023-04-09: qty 2

## 2023-04-09 MED ORDER — ONDANSETRON HCL 4 MG PO TABS: 4.0000 mg | ORAL_TABLET | Freq: Four times a day (QID) | ORAL | Status: DC | PRN

## 2023-04-09 MED ORDER — ACETAMINOPHEN 500 MG PO TABS
1000.0000 mg | ORAL_TABLET | ORAL | Status: AC
  Administered 2023-04-09: 1000 mg via ORAL

## 2023-04-09 MED ORDER — POVIDONE-IODINE 10 % EX SWAB: 2.0000 | Freq: Once | CUTANEOUS | Status: DC

## 2023-04-09 MED ORDER — ONDANSETRON HCL 4 MG/2ML IJ SOLN: 4.0000 mg | Freq: Four times a day (QID) | INTRAMUSCULAR | Status: DC | PRN

## 2023-04-09 MED ORDER — ROCURONIUM BROMIDE 100 MG/10ML IV SOLN
INTRAVENOUS | Status: DC | PRN
  Administered 2023-04-09: 60 mg via INTRAVENOUS
  Administered 2023-04-09: 20 mg via INTRAVENOUS

## 2023-04-09 MED ORDER — DEXTROSE-SODIUM CHLORIDE 5-0.45 % IV SOLN: INTRAVENOUS | Status: DC

## 2023-04-09 MED ORDER — EPHEDRINE SULFATE (PRESSORS) 50 MG/ML IJ SOLN
INTRAMUSCULAR | Status: DC | PRN
  Administered 2023-04-09 (×2): 10 mg via INTRAVENOUS
  Administered 2023-04-09: 5 mg via INTRAVENOUS

## 2023-04-09 MED ORDER — SIMETHICONE 80 MG PO CHEW: 80.0000 mg | CHEWABLE_TABLET | Freq: Four times a day (QID) | ORAL | Status: DC | PRN

## 2023-04-09 MED ORDER — ROCURONIUM BROMIDE 10 MG/ML (PF) SYRINGE
PREFILLED_SYRINGE | INTRAVENOUS | Status: AC
  Filled 2023-04-09: qty 10

## 2023-04-09 MED ORDER — MORPHINE SULFATE (PF) 4 MG/ML IV SOLN: 1.0000 mg | INTRAVENOUS | Status: DC | PRN

## 2023-04-09 MED ORDER — DEXAMETHASONE SODIUM PHOSPHATE 4 MG/ML IJ SOLN
INTRAMUSCULAR | Status: DC | PRN
  Administered 2023-04-09: 5 mg via INTRAVENOUS

## 2023-04-09 MED ORDER — KETOROLAC TROMETHAMINE 30 MG/ML IJ SOLN
INTRAMUSCULAR | Status: DC | PRN
  Administered 2023-04-09: 30 mg via INTRAVENOUS

## 2023-04-09 MED ORDER — MIDAZOLAM HCL 2 MG/2ML IJ SOLN
INTRAMUSCULAR | Status: AC
Start: 2023-04-09 — End: ?
  Filled 2023-04-09: qty 2

## 2023-04-09 MED ORDER — MENTHOL 3 MG MT LOZG: 1.0000 | LOZENGE | OROMUCOSAL | Status: DC | PRN

## 2023-04-09 MED ORDER — FENTANYL CITRATE (PF) 250 MCG/5ML IJ SOLN
INTRAMUSCULAR | Status: AC
  Filled 2023-04-09: qty 5

## 2023-04-09 MED ORDER — ALUM & MAG HYDROXIDE-SIMETH 200-200-20 MG/5ML PO SUSP: 30.0000 mL | ORAL | Status: DC | PRN

## 2023-04-09 MED ORDER — ACETAMINOPHEN 500 MG PO TABS
ORAL_TABLET | ORAL | Status: AC
  Filled 2023-04-09: qty 2

## 2023-04-09 MED ORDER — GABAPENTIN 100 MG PO CAPS
ORAL_CAPSULE | ORAL | Status: AC
  Filled 2023-04-09: qty 1

## 2023-04-09 MED ORDER — OXYCODONE-ACETAMINOPHEN 5-325 MG PO TABS
2.0000 | ORAL_TABLET | ORAL | 0 refills | Status: AC | PRN
  Filled 2023-04-09: qty 30, 3d supply, fill #0

## 2023-04-09 MED ORDER — OXYCODONE-ACETAMINOPHEN 5-325 MG PO TABS
ORAL_TABLET | ORAL | Status: AC
  Filled 2023-04-09: qty 2

## 2023-04-09 MED ORDER — BUPIVACAINE HCL (PF) 0.25 % IJ SOLN
INTRAMUSCULAR | Status: DC | PRN
  Administered 2023-04-09: 12 mL

## 2023-04-09 MED ORDER — CEFOTETAN DISODIUM 2 G IJ SOLR
INTRAMUSCULAR | Status: AC
  Filled 2023-04-09: qty 2

## 2023-04-09 MED ORDER — LIDOCAINE HCL (PF) 2 % IJ SOLN
INTRAMUSCULAR | Status: AC
Start: 2023-04-09 — End: ?
  Filled 2023-04-09: qty 5

## 2023-04-09 SURGICAL SUPPLY — 70 items
APPLICATOR ARISTA FLEXITIP XL (MISCELLANEOUS) IMPLANT
APPLICATOR COTTON TIP 6 STRL (MISCELLANEOUS) ×2 IMPLANT
APPLICATOR COTTON TIP 6IN STRL (MISCELLANEOUS) ×2 IMPLANT
BLADE SURG 10 STRL SS (BLADE) IMPLANT
CABLE HIGH FREQUENCY MONO STRZ (ELECTRODE) IMPLANT
CELL SAVER LIPIGURD (MISCELLANEOUS) IMPLANT
CHLORAPREP W/TINT 26 (MISCELLANEOUS) ×2 IMPLANT
COVER BACK TABLE 60X90IN (DRAPES) ×2 IMPLANT
COVER MAYO STAND STRL (DRAPES) ×2 IMPLANT
COVER SURGICAL LIGHT HANDLE (MISCELLANEOUS) IMPLANT
DERMABOND ADVANCED .7 DNX12 (GAUZE/BANDAGES/DRESSINGS) ×2 IMPLANT
DEVICE RETRIEVAL ALEXIS 14 (MISCELLANEOUS) IMPLANT
DILATOR CANAL MILEX (MISCELLANEOUS) IMPLANT
DRAPE SURG IRRIG POUCH 19X23 (DRAPES) ×2 IMPLANT
EXTRT SYSTEM ALEXIS 14CM (MISCELLANEOUS) IMPLANT
EXTRT SYSTEM ALEXIS 17CM (MISCELLANEOUS) IMPLANT
GAUZE 4X4 16PLY ~~LOC~~+RFID DBL (SPONGE) ×4 IMPLANT
GLOVE BIO SURGEON STRL SZ 6.5 (GLOVE) ×2 IMPLANT
GLOVE BIOGEL PI IND STRL 6.5 (GLOVE) ×2 IMPLANT
GLOVE BIOGEL PI IND STRL 7.0 (GLOVE) ×4 IMPLANT
GLOVE BIOGEL PI IND STRL 7.5 (GLOVE) IMPLANT
GLOVE ECLIPSE 6.5 STRL STRAW (GLOVE) ×4 IMPLANT
GOWN STRL REUS W/TWL XL LVL3 (GOWN DISPOSABLE) ×4 IMPLANT
HARMONIC RUM II 4.0CM SILVER (DISPOSABLE) ×2 IMPLANT
HEMOSTAT ARISTA ABSORB 3G PWDR (HEMOSTASIS) IMPLANT
IRRIG SUCT STRYKERFLOW 2 WTIP (MISCELLANEOUS) ×2 IMPLANT
IRRIGATION SUCT STRKRFLW 2 WTP (MISCELLANEOUS) ×4 IMPLANT
IV NS 1000ML BAXH (IV SOLUTION) IMPLANT
KIT PINK PAD W/HEAD ARE REST (MISCELLANEOUS) ×2 IMPLANT
KIT PINK PAD W/HEAD ARM REST (MISCELLANEOUS) ×2 IMPLANT
KIT TURNOVER CYSTO (KITS) ×2 IMPLANT
LEGGING LITHOTOMY PAIR STRL (DRAPES) ×2 IMPLANT
LIGASURE VESSEL 5MM BLUNT TIP (ELECTROSURGICAL) ×2 IMPLANT
NDL INSUFFLATION 14GA 120MM (NEEDLE) ×2 IMPLANT
NEEDLE INSUFFLATION 14GA 120MM (NEEDLE) ×2 IMPLANT
NS IRRIG 1000ML POUR BTL (IV SOLUTION) ×2 IMPLANT
OCCLUDER COLPOPNEUMO (BALLOONS) ×2 IMPLANT
PACK LAPAROSCOPY BASIN (CUSTOM PROCEDURE TRAY) ×2 IMPLANT
PENCIL SMOKE EVACUATOR (MISCELLANEOUS) IMPLANT
POUCH LAPAROSCOPIC INSTRUMENT (MISCELLANEOUS) ×2 IMPLANT
POWDER SURGICEL 3.0 GRAM (HEMOSTASIS) IMPLANT
PROTECTOR NERVE ULNAR (MISCELLANEOUS) ×2 IMPLANT
SCALPEL HRMNC RUM II 4.0 SILVR (DISPOSABLE) IMPLANT
SCISSORS LAP 5X35 DISP (ENDOMECHANICALS) IMPLANT
SET IRRIG Y TYPE TUR BLADDER L (SET/KITS/TRAYS/PACK) ×2 IMPLANT
SET TRI-LUMEN FLTR TB AIRSEAL (TUBING) ×2 IMPLANT
SHEARS HARMONIC 36 ACE (MISCELLANEOUS) ×2 IMPLANT
SLEEVE SCD COMPRESS KNEE MED (STOCKING) ×2 IMPLANT
SUT VIC AB 0 CT1 27XBRD ANBCTR (SUTURE) ×4 IMPLANT
SUT VIC AB 4-0 PS2 18 (SUTURE) ×2 IMPLANT
SUT VICRYL 0 UR6 27IN ABS (SUTURE) IMPLANT
SUT VLOC 180 0 9IN GS21 (SUTURE) ×2 IMPLANT
SYR 10ML LL (SYRINGE) ×2 IMPLANT
SYR 50ML LL SCALE MARK (SYRINGE) ×4 IMPLANT
SYSTEM CARTER THOMASON II (TROCAR) IMPLANT
SYSTEM CONTND EXTRCTN KII BLLN (MISCELLANEOUS) IMPLANT
TIP ENDOSCOPIC SURGICEL (TIP) IMPLANT
TIP UTERINE 5.1X6CM LAV DISP (MISCELLANEOUS) IMPLANT
TIP UTERINE 6.7X10CM GRN DISP (MISCELLANEOUS) IMPLANT
TIP UTERINE 6.7X6CM WHT DISP (MISCELLANEOUS) IMPLANT
TIP UTERINE 6.7X8CM BLUE DISP (MISCELLANEOUS) IMPLANT
TOWEL OR 17X24 6PK STRL BLUE (TOWEL DISPOSABLE) ×4 IMPLANT
TRAY FOLEY W/BAG SLVR 14FR LF (SET/KITS/TRAYS/PACK) ×2 IMPLANT
TROCAR ADV FIXATION 5X100MM (TROCAR) ×2 IMPLANT
TROCAR KII 8X100ML NONTHREADED (TROCAR) ×2 IMPLANT
TROCAR PORT AIRSEAL 5X120 (TROCAR) ×2 IMPLANT
TROCAR Z-THREAD FIOS 5X100MM (TROCAR) ×2 IMPLANT
WARMER LAPAROSCOPE (MISCELLANEOUS) ×2 IMPLANT
WATER STERILE IRR 3000ML UROMA (IV SOLUTION) ×2 IMPLANT
WATER STERILE IRR 500ML POUR (IV SOLUTION) IMPLANT

## 2023-04-09 NOTE — Transfer of Care (Signed)
Immediate Anesthesia Transfer of Care Note  Patient: Jillian Reyes  Procedure(s) Performed: Procedure(s) (LRB): TOTAL LAPAROSCOPIC HYSTERECTOMY WITH SALPINGECTOMY POSSIBLE OOPHORECTOMY (Bilateral) CYSTOSCOPY (N/A)  Patient Location: PACU  Anesthesia Type: GA  Level of Consciousness: awake, sedated, patient cooperative and responds to stimulation, c/o pain in back - comfort measures given w/ medication   Airway & Oxygen Therapy: Patient Spontanous Breathing and Patient connected to [place] oxygen  Post-op Assessment: Report given to PACU RN, Post -op Vital signs reviewed and stable and Patient moving all extremities  Post vital signs: Reviewed and stable  Complications: No apparent anesthesia complications

## 2023-04-09 NOTE — Op Note (Signed)
04/09/2023  10:18 AM  PATIENT:  Jillian Reyes  33 y.o. female  PRE-OPERATIVE DIAGNOSIS:  Pelvic Pain Right ovarian cyst  POST-OPERATIVE DIAGNOSIS:  Pelvic PainRight ovarian cyst  PROCEDURE:  Procedure(s): TOTAL LAPAROSCOPIC HYSTERECTOMY WITH SALPINGECTOMY POSSIBLE OOPHORECTOMY CYSTOSCOPY  SURGEON:  Jerene Bears  ASSISTANTS: Dr. Briscoe Deutscher.  An experienced assistant was required given the standard of surgical care given the complexity of the case.  This assistant was needed for exposure, dissection, suctioning, retraction, instrument exchange and for overall help during the procedure.  RNFA help was also unavailable.  ANESTHESIA:   general  ESTIMATED BLOOD LOSS: 25 mL  BLOOD ADMINISTERED:none   FLUIDS: 1200 cc LR  UOP: 180cc  SPECIMEN:  uterus, cervix and bilateral fallopian tubes  DISPOSITION OF SPECIMEN:   pathology  FINDINGS: normal pelvis, normal upper abdomen  DESCRIPTION OF OPERATION: Patient is taken to the operating room. She is placed in the supine position. She is a running IV in place. Informed consent was present on the chart. SCDs on her lower extremities and functioning properly. Patient was positioned while she was awake.  Her legs were placed in the low lithotomy position in Rowan stirrups. Her arms were tucked by the side.  General endotracheal anesthesia was administered by the anesthesia staff without difficulty. Dr. Sampson Goon, anesthesia, oversaw case.  Time out performed.    Clora prep was then used to prep the abdomen and Hibiclens was used to prep the inner thighs, perineum and vagina. Once 3 minutes had past the patient was draped in a normal standard fashion. The legs were lifted to the high lithotomy position. The cervix was visualized by placing a heavy weighted speculum in the posterior aspect of the vagina and using a curved Deaver retractor to the retract anteriorly. The anterior lip of the cervix was grasped with single-tooth tenaculum.  The cervix  sounding was initially difficult due to hx of endometrial ablation.  Half hagar dilators were used until a #6 tip could be passed.  Uterus at this point sounded to 7 cm. The #6 disposable tip was placed on the RUMI manipulator as well as a 3.5, silver KOH ring. This was passed through the cervix and the bulb of the disposable tip was inflated with 2 cc of normal saline. There was a good fit of the KOH ring around the cervix. The tenaculum was removed. There is also good manipulation of the uterus. The speculum and retractor were removed as well. A Foley catheter was placed to straight drain.  Clear urine was noted. Legs were lowered to the low lithotomy position and attention was turned the abdomen.  The umbilicus was everted.  Marcaine 0.25% used to anesthetize the skin.  Using #11 blade, 5mm skin incision was made.  A Veress needle was obtained. Syringe of sterile saline was placed on a open Veress needle.  With the abdomen elevated, the Veress needle was passed into the umbilicus until the pop was heard and then fluid started to drip.  Then low flow CO2 gas was attached the needle and the pneumoperitoneum was achieved without difficulty. Once four liters of gas was in the abdomen the Veress needle was removed and a 5 millimeter non-bladed Optiview trocar and port were passed directly to the abdomen. The laparoscope was then used to confirm intraperitoneal placement.  Locations for RLQ, LLQ, and suprapubic ports were noted by transillumination of the abdominal wall.  0.25% marcaine was used to anesthetize the skin.  8mm skin incision was made in the  RLQ and an AirSeal port was placed underdirect visualization of the laparoscope.  Then a 5mm skin incision was made and a 5mm nonbladed trochar and port was placed in the LLQ.  Finally, and 8mm skin incision was made about 4cm above the pubic symphasis and an 8mm non-bladed port was placed with direct visualization of the laparoscope.  All trochars were removed.     Ureters were identifies.  Attention was turned to the left side. With uterus on stretch the left tube was excised off the ovary and mesosalpinx was dissected to free the tube. Then the left utero-ovarian pedicle was serially clamped cauterized and incised using the ligasure device. Left round ligament was serially clamped cauterized and incised. The anterior and posterior peritoneum of the inferior leaf of the broad ligament were opened. The beginning of the bladder flap was created.  The bladder was taken down below the level of the KOH ring. The left uterine artery skeletonized and then just superior to the KOH ring this vessel was serially clamped, cauterized, and incised.  Attention was turned the right side.  The uterus was placed on stretch to the opposite side.  The tube was excised off the ovary using sharp dissection a bipolar cautery.  The mesosalpinx was incised freeing the tube. Then the right uterine ovarian pedicle was serially clamped cauterized and incised. Next the right round ligament was serially clamped cauterized and incised. The anterior posterior peritoneum of the inferiorly for the broad ligament were opened. The anterior peritoneum was carried across to the dissection on the left side. The remainder of the bladder flap was created using sharp dissection. The bladder was well below the level of the KOH ring. The right uterine artery skeletonized. Then the right uterine artery, above the level of the KOH ring, was serially clamped cauterized and incised. The uterus was devascularized at this point.  The colpotomy was performed a starting in the midline and using a harmonic scalpel with the inferior edge of the open blade  This was carried around a circumferential fashion until the vaginal mucosa was completely incised in the specimen was freed.  The specimen was then delivered to the vagina.  A vaginal occlusive device was used to maintain the pneumoperitoneum  Instruments were  changed with a needle driver and Kobra graspers.  Using a 9 inch V. lock suture, the cuff was closed by incorporating the anterior and posterior vaginal mucosa in each stitch. This was carried across all the way to the left corner and a running fashion. Two stitches were brought back towards the midline and the suture was cut flush with the vagina. The needle was brought out the pelvis. The pelvis was irrigated. All pedicles were inspected. No bleeding was noted.   Co2 pressures were lowered to 8mm Hg.  Again, no bleeding was noted.  Ureters were noted deep in the pelvis to be peristalsing.  Arista was placed along the pedicles.  At this point the procedure was completed.  The remaining instruments were removed.  The ports (except the suprapubic port) were removed under direct visualization of the laparoscope and the pneumoperitoneum was relieved.  The patient was taken out of Trendelenburg positioning.  Several deep breaths were given to the patient's trying to any gas the abdomen and finally the suprapubic port was removed.  The skin was then closed with subcuticular stitches of 3-0 Vicryl. The skin was cleansed Dermabond was applied. Attention was then turned the vagina and the cuff was inspected. No bleeding  was noted. The anterior posterior vaginal mucosa was incorporated in each stitch. The Foley catheter was removed.  Cystoscopy was performed.  No sutures or bladder injuries were noted.  Ureters were noted with normal urine jets from each one was seen.  Foley was left out after the cystoscopic fluid was drained and cystoscope removed.  Sponge, lap, needle, instrument counts were correct x2. Patient tolerated the procedure very well. She was awakened from anesthesia, extubated and taken to recovery in stable condition.    COUNTS:  YES  PLAN OF CARE: Transfer to PACU

## 2023-04-09 NOTE — Progress Notes (Signed)
Referral to pelvic PT place.

## 2023-04-09 NOTE — H&P (Signed)
Jillian Reyes is an 33 y.o. female. G2P1 MWF with hx of dysmenorrhea and pelvic pain that has worsened gradually since endometrial ablation and meets criteria for post ablation syndrome here for definitive surgery.  She doesn't really bleed but has worsening symptoms when it feels she would have bleeding.  Most recent ultrasound showed uterus measuring 6.6 x 3.7 and 3.6cm with endometrium of 2.48mm.  Ovaries were normal.    She was referred to pelvic PT but appointments were several months out and she is desirous of definitive treatment.  Aware she may need post op PT if symptoms do not fully resolve.  Procedure discussed with patient.  Risks have been reviewed and include but are not limited to bleeding, 1% risk of receiving a  transfusion, infection, 3-4% risk of bowel/bladder/ureteral/vascular injury discussed as well as possible need for additional surgery if injury does occur discussed.  DVT/PE and rare risk of death discussed. Vaginal cuff dehiscence discussed.  Hernia formation discussed.  Positioning and incision locations discussed.  Patient aware if pathology abnormal she may need additional treatment.  All questions answered.  Reviewed again today.   Pertinent Gynecological History: Menses: minimal since endometrial ablation Contraception: vasectomy DES exposure: denies Blood transfusions: none Sexually transmitted diseases: no past history Previous GYN Procedures:  hysteroscopy with endometrial ablation done 2020   Last mammogram:  n/a   Last pap: normal Date: 06/14/2021 OB History: G2, P1   Menstrual History: No LMP recorded. Patient has had an ablation.    Past Medical History:  Diagnosis Date   Acne    Bipolar 1 disorder (HCC)    Chronic back pain    Chronic pelvic pain in female    Chronic tension-type headache, intractable    Dyslipidemia 02/18/2013   Eczema    GAD (generalized anxiety disorder)    History of abnormal cervical Pap smear    History of chlamydia 2015    History of gestational diabetes    Irritable bowel syndrome with constipation    followed by [facility] in [place]   Migraine without aura and without status migrainosus, not intractable    neurologist--- dr Everlena Cooper   Mild intermittent asthma    followed by pcp   Mild obstructive sleep apnea    sleep study in epic 05-10-2022,  no cpap recommendation   PCOS (polycystic ovarian syndrome)    Right ovarian cyst    Vitamin D deficiency    Wears glasses     Past Surgical History:  Procedure Laterality Date   BREAST REDUCTION SURGERY Bilateral 01/05/2021   Procedure: BREAST REDUCTION WITH LIPOSUCTION;  Surgeon: Peggye Form, DO;  Location: [facility];  Service: Plastics;  Laterality: Bilateral;   HYSTEROSCOPY WITH NOVASURE N/A 07/06/2020   Procedure: HYSTEROSCOPY WITH NOVASURE ABLATION, DILITATION AND CURETTAGE;  Surgeon: Jerene Bears, MD;  Location: [facility] [place];  Service: Gynecology;  Laterality: N/A;   INCISION AND DRAINAGE Right 01/12/2019   Procedure: INCISION AND DRAINAGE OF LESION;  Surgeon: Jerene Bears, MD;  Location: [facility];  Service: Gynecology;  Laterality: Right;   KNEE ARTHROSCOPY Right 04-16-2019  @[facility]    LAPAROSCOPY N/A 01/12/2019   Procedure: LAPAROSCOPY DIAGNOSTIC; LYSIS OF ADHESIONS;  Surgeon: Jerene Bears, MD;  Location: [facility];  Service: Gynecology;  Laterality: N/A;  possible treatement of endometriosis   WISDOM TOOTH EXTRACTION  2016    Family History  Problem Relation Age of Onset   Heart disease Other  Diabetes Father 75       type 2   Thyroid disease Father    Brain cancer Father    Cancer Father        tumor of the brain cancer   Migraines Mother    Kidney disease Mother    Colon polyps Mother    Hyperlipidemia Other        great grandparents    Social History:  reports that she quit smoking about 4 years ago. Her smoking use included cigarettes. She  started smoking about 17 years ago. She has never used smokeless tobacco. She reports that she does not currently use alcohol. She reports that she does not use drugs.  Allergies:  Allergies  Allergen Reactions   Cymbalta [Duloxetine Hcl] Nausea And Vomiting    Medications Prior to Admission  Medication Sig Dispense Refill Last Dose/Taking   alprazolam (XANAX) 2 MG tablet Take 1 tablet (2 mg total) by mouth at bedtime as needed. for sleep. (Patient taking differently: Take 2 mg by mouth at bedtime as needed for sleep. for sleep.) 30 tablet 1 04/08/2023 Evening   lamoTRIgine (LAMICTAL) 200 MG tablet Take 1 tablet (200 mg total) by mouth daily. NEEDS APPT FOR REFILLS (Patient taking differently: Take 200 mg by mouth at bedtime. NEEDS APPT FOR REFILLS) 90 tablet 0 04/08/2023 Evening   LINZESS 145 MCG CAPS capsule Take 145 mcg by mouth daily before breakfast.   04/08/2023 Morning   metFORMIN (GLUCOPHAGE) 500 MG tablet Take 1 tablet (500 mg total) by mouth daily with breakfast. (Patient taking differently: Take 500 mg by mouth daily with breakfast.) 90 tablet 3 04/08/2023 Morning   spironolactone (ALDACTONE) 50 MG tablet Take 100 mg by mouth at bedtime. For acne   04/08/2023 Evening   Ubrogepant (UBRELVY) 100 MG TABS Take 1 tablet (100 mg total) by mouth as needed. May repeat after 2 hours.  Maximum 2 tablets in 24 hours. (Patient taking differently: Take 1 tablet by mouth as needed. May repeat after 2 hours.  Maximum 2 tablets in 24 hours.) 10 tablet 11 Past Month   acetaminophen (TYLENOL) 500 MG tablet Take 1,000 mg by mouth every 6 (six) hours as needed.   Unknown   albuterol (VENTOLIN HFA) 108 (90 Base) MCG/ACT inhaler Inhale 2 puffs into the lungs every 6 (six) hours as needed for wheezing or shortness of breath. (Patient taking differently: Inhale 2 puffs into the lungs every 6 (six) hours as needed for wheezing or shortness of breath.) 18 g 2 More than a month   celecoxib (CELEBREX) 200 MG capsule  Take 1 capsule (200 mg total) by mouth daily. (Patient taking differently: Take 200 mg by mouth daily as needed.) 30 capsule 1 Unknown   clobetasol cream (TEMOVATE) 0.05 % APPLY TOPICALLY DAILY AS NEEDED. ECZEMA (Patient taking differently: Apply 1 Application topically daily as needed. APPLY TOPICALLY DAILY AS NEEDED. ECZEMA) 60 g 3 Unknown   EUCRISA 2 % OINT Apply topically as needed.   Unknown   fluticasone (FLONASE) 50 MCG/ACT nasal spray Place 1-2 sprays into both nostrils daily as needed for allergies or rhinitis.   Unknown   gabapentin (NEURONTIN) 100 MG capsule Take 1 capsule nightly x 3 nights and then increase to 2 capsules nightly x 3 nights.  Then increase to 3 capsules. (Patient taking differently: Take 100 mg by mouth at bedtime as needed. Take 1 capsule nightly x 3 nights and then increase to 2 capsules nightly x 3 nights.  Then increase  to 3 capsules.) 60 capsule 1 Unknown   ibuprofen (ADVIL) 200 MG tablet Take 800 mg by mouth every 6 (six) hours as needed.   Unknown   metroNIDAZOLE (METROCREAM) 0.75 % cream Apply topically 2 (two) times daily as needed.   Unknown   Multiple Vitamin (MULTIVITAMIN ADULT PO) Take 1 tablet by mouth 2 (two) times a week.   Unknown   ondansetron (ZOFRAN-ODT) 8 MG disintegrating tablet Take 1 tablet (8 mg total) by mouth every 8 (eight) hours as needed for nausea or vomiting. 20 tablet 0 Unknown    Review of Systems  Constitutional: Negative.   Genitourinary:        Pelvic cramping    Blood pressure (!) 104/59, pulse 66, temperature 97.7 F (36.5 C), temperature source Oral, resp. rate 17, height 5' 4.5" (1.638 m), weight 76 kg, SpO2 98%. Physical Exam Constitutional:      Appearance: Normal appearance.  Cardiovascular:     Rate and Rhythm: Normal rate and regular rhythm.  Pulmonary:     Effort: Pulmonary effort is normal.     Breath sounds: Normal breath sounds.  Neurological:     General: No focal deficit present.     Mental Status: She is  alert.  Psychiatric:        Mood and Affect: Mood normal.     Results for orders placed or performed during the hospital encounter of 04/09/23 (from the past 24 hours)  Pregnancy, urine POC     Status: None   Collection Time: 04/09/23  6:21 AM  Result Value Ref Range   Preg Test, Ur NEGATIVE NEGATIVE    No results found.  Assessment/Plan: 33 yo G2p1 MWF with post ablation syndrome, chronic pelvic pain here for definitive treatment with TLH/bilateral salpingectomy, cystoscopy.  We have reviewed risks/benefits today.  Spouse present.  Questions answered.  Pt ready to proceed.  Jerene Bears 04/09/2023, 6:39 AM

## 2023-04-09 NOTE — Discharge Instructions (Signed)
Post Op Hysterectomy Instructions Please read the instructions below. Refer to these instructions for the next few weeks. These instructions provide you with general information on caring for yourself after surgery. Your caregiver may also give you specific instructions. While your treatment has been planned according to the most current medical practices available, unavoidable problems sometimes happen. If you have any problems or questions after you leave, please call your caregiver.  HOME CARE INSTRUCTIONS Healing will take time. You will have discomfort, tenderness, swelling and bruising at the operative site for a couple of weeks. This is normal and will get better as time goes on.  Only take over-the-counter or prescription medicines for pain, discomfort or fever as directed by your caregiver.  Do not take aspirin. It can cause bleeding.  Do not drive when taking pain medication.  Follow your caregiver's advice regarding diet, exercise, lifting, driving and general activities.  Resume your usual diet as directed and allowed.  Get plenty of rest and sleep.  Do not douche, use tampons, or have sexual intercourse until your caregiver gives you permission. .  Take your temperature if you feel hot or flushed.  You may shower today when you get home.  No tub bath for one week.   Do not drink alcohol until you are not taking any narcotic pain medications.  Try to have someone home with you for a week or two to help with the household activities.   Be careful over the next two to three weeks with any activities at home that involve lifting, pushing, or pulling.  Listen to your body--if something feels uncomfortable to do, then don't do it. Make sure you and your family understands everything about your operation and recovery.  Walking up stairs is fine. Do not sign any legal documents until you feel normal again.  Keep all your follow-up appointments as recommended by your caregiver.   PLEASE CALL  THE OFFICE IF: There is swelling, redness or increasing pain in the wound area.  Pus is coming from the wound.  You notice a bad smell from the wound or surgical dressing.  You have pain, redness and swelling from the intravenous site.  The wound is breaking open (the edges are not staying together).   You develop pain or bleeding when you urinate.  You develop abnormal vaginal discharge.  You have any type of abnormal reaction or develop an allergy to your medication.  You need stronger pain medication for your pain   SEEK IMMEDIATE MEDICAL CARE: You develop a temperature of 100.5 or higher.  You develop abdominal pain.  You develop chest pain.  You develop shortness of breath.  You pass out.  You develop pain, swelling or redness of your leg.  You develop heavy vaginal bleeding with or without blood clots.   MEDICATIONS: Restart your regular medications BUT wait one week before restarting all vitamins and mineral supplements Use Motrin 800mg  every 8 hours for the next several days.  This will help you use less Percocet.  Use the Percocet 5/325 1-2 tabs every 4-6 hours as needed for pain.  Don't take any extra tylenol if taking the percocet regularly.  You can also take the gabapentin you have with these medications. You may use an over the counter stool softener like Colace or Dulcolax to help with starting a bowel movement.  Start the day after you go home.  Warm liquids, fluids, and ambulation help too.  If you have not had a bowel movement in  four days, you need to call the office.

## 2023-04-09 NOTE — Progress Notes (Signed)
*   Day of Surgery * Procedure(s) (LRB): TOTAL LAPAROSCOPIC HYSTERECTOMY WITH SALPINGECTOMY (Bilateral) CYSTOSCOPY (N/A)  Subjective: Patient reports no complaints.  Has no nausea.  Has good pain control.  Voiding easily.  Has walked in halls.  Has eaten.    Objective: I have reviewed patient's vital signs and intake and output. Vitals:   04/09/23 1211 04/09/23 1326  BP: 102/61 98/62  Pulse: 99 100  Resp: 16 16  Temp:    SpO2: 98% 96%    General: alert, cooperative, and no distress Resp: clear to auscultation bilaterally Cardio: regular rate and rhythm, S1, S2 normal, no murmur, click, rub or gallop GI: incision: clean, dry, and intact and soft, non distended, mildly tender to palpation, +BS Extremities: extremities normal, atraumatic, no cyanosis or edema Vaginal Bleeding: minimal  Assessment: s/p Procedure(s): TOTAL LAPAROSCOPIC HYSTERECTOMY WITH SALPINGECTOMY (Bilateral) CYSTOSCOPY (N/A): progressing well  Plan: Pt meeting all criteria for discharge so will discharge home.    LOS: 0 days    Jerene Bears, MD 04/09/2023, 4:03 PM

## 2023-04-09 NOTE — Anesthesia Procedure Notes (Signed)
Procedure Name: Intubation Date/Time: 04/09/2023 7:51 AM  Performed by: Jessica Priest, CRNAPre-anesthesia Checklist: Patient identified, Emergency Drugs available, Suction available, Patient being monitored and Timeout performed Patient Re-evaluated:Patient Re-evaluated prior to induction Oxygen Delivery Method: Circle system utilized Preoxygenation: Pre-oxygenation with 100% oxygen Induction Type: IV induction Ventilation: Mask ventilation without difficulty Laryngoscope Size: Mac and 3 Grade View: Grade II Tube type: Oral Tube size: 7.0 mm Number of attempts: 1 Airway Equipment and Method: Stylet and Oral airway Placement Confirmation: ETT inserted through vocal cords under direct vision, positive ETCO2, breath sounds checked- equal and bilateral and CO2 detector Secured at: 23 cm Tube secured with: Tape Dental Injury: Teeth and Oropharynx as per pre-operative assessment

## 2023-04-09 NOTE — Anesthesia Postprocedure Evaluation (Signed)
Anesthesia Post Note  Patient: TRISHIA CARAWAN  Procedure(s) Performed: TOTAL LAPAROSCOPIC HYSTERECTOMY WITH SALPINGECTOMY POSSIBLE OOPHORECTOMY (Bilateral: Abdomen) CYSTOSCOPY (Bladder)     Patient location during evaluation: PACU Anesthesia Type: General Level of consciousness: awake and alert Pain management: pain level controlled Vital Signs Assessment: post-procedure vital signs reviewed and stable Respiratory status: spontaneous breathing, nonlabored ventilation and respiratory function stable Cardiovascular status: blood pressure returned to baseline and stable Postop Assessment: no apparent nausea or vomiting Anesthetic complications: no  No notable events documented.  Last Vitals:  Vitals:   04/09/23 1107 04/09/23 1135  BP: 92/63 (!) 96/59  Pulse: 91 88  Resp: 16 16  Temp:  36.6 C  SpO2: 94% 98%    Last Pain:  Vitals:   04/09/23 1135  TempSrc:   PainSc: 3                  Zamir Staples,W. EDMOND

## 2023-04-10 ENCOUNTER — Encounter (HOSPITAL_BASED_OUTPATIENT_CLINIC_OR_DEPARTMENT_OTHER): Payer: Self-pay | Admitting: Obstetrics & Gynecology

## 2023-04-10 LAB — SURGICAL PATHOLOGY

## 2023-04-16 ENCOUNTER — Ambulatory Visit (HOSPITAL_BASED_OUTPATIENT_CLINIC_OR_DEPARTMENT_OTHER): Payer: Federal, State, Local not specified - PPO | Admitting: Obstetrics & Gynecology

## 2023-04-16 ENCOUNTER — Encounter (HOSPITAL_BASED_OUTPATIENT_CLINIC_OR_DEPARTMENT_OTHER): Payer: Self-pay | Admitting: Obstetrics & Gynecology

## 2023-04-16 VITALS — BP 100/63 | HR 75 | Resp 16

## 2023-04-16 DIAGNOSIS — Z9889 Other specified postprocedural states: Secondary | ICD-10-CM

## 2023-04-16 NOTE — Progress Notes (Signed)
GYNECOLOGY  VISIT  CC:   post op recheck  HPI: 33 y.o. G64P1011 Married Other or two or more races female here for recheck after undergoing TLh/bilateral salpingectomy/cystoscopy on 04/09/2023.  She reports bleeding is none.  She has min/moderate pain but taking only ibuprofen and/or motrin.  Bowel function is normal for her but she does have chronic constipation so is taking metamucil .  Bladder function is normal.    Pathology reviewed:  Yes .  Questions answered.    MEDS:   Current Outpatient Medications on File Prior to Visit  Medication Sig Dispense Refill   acetaminophen (TYLENOL) 500 MG tablet Take 1,000 mg by mouth every 6 (six) hours as needed.     albuterol (VENTOLIN HFA) 108 (90 Base) MCG/ACT inhaler Inhale 2 puffs into the lungs every 6 (six) hours as needed for wheezing or shortness of breath. (Patient taking differently: Inhale 2 puffs into the lungs every 6 (six) hours as needed for wheezing or shortness of breath.) 18 g 2   alprazolam (XANAX) 2 MG tablet Take 1 tablet (2 mg total) by mouth at bedtime as needed. for sleep. (Patient taking differently: Take 2 mg by mouth at bedtime as needed for sleep. for sleep.) 30 tablet 1   EUCRISA 2 % OINT Apply topically as needed.     ibuprofen (ADVIL) 800 MG tablet Take 1 tablet (800 mg total) by mouth every 8 (eight) hours as needed. 30 tablet 0   lamoTRIgine (LAMICTAL) 200 MG tablet Take 1 tablet (200 mg total) by mouth daily. NEEDS APPT FOR REFILLS (Patient taking differently: Take 200 mg by mouth at bedtime. NEEDS APPT FOR REFILLS) 90 tablet 0   LINZESS 145 MCG CAPS capsule Take 145 mcg by mouth daily before breakfast.     metFORMIN (GLUCOPHAGE) 500 MG tablet Take 1 tablet (500 mg total) by mouth daily with breakfast. (Patient taking differently: Take 500 mg by mouth daily with breakfast.) 90 tablet 3   metroNIDAZOLE (METROCREAM) 0.75 % cream Apply topically 2 (two) times daily as needed.     Multiple Vitamin (MULTIVITAMIN ADULT PO) Take  1 tablet by mouth 2 (two) times a week.     ondansetron (ZOFRAN-ODT) 8 MG disintegrating tablet Take 1 tablet (8 mg total) by mouth every 8 (eight) hours as needed for nausea or vomiting. 20 tablet 0   spironolactone (ALDACTONE) 50 MG tablet Take 100 mg by mouth at bedtime. For acne     Ubrogepant (UBRELVY) 100 MG TABS Take 1 tablet (100 mg total) by mouth as needed. May repeat after 2 hours.  Maximum 2 tablets in 24 hours. (Patient taking differently: Take 1 tablet by mouth as needed. May repeat after 2 hours.  Maximum 2 tablets in 24 hours.) 10 tablet 11   No current facility-administered medications on file prior to visit.    SH:  Smoking No    PHYSICAL EXAMINATION:    BP 100/63   Pulse 75   Resp 16     General appearance: alert, cooperative and appears stated age CV:  Regular rate and rhythm Lungs:  clear to auscultation, no wheezes, rales or rhonchi, symmetric air entry Abdomen: soft, non-tender; bowel sounds normal; no masses,  no organomegaly Incisions:  C/D/I  Pelvic: deferred   Assessment/Plan: 1. Post-operative state (Primary) - doing well.  FMLA paperwork completed today.  Restrictions discussed.  Recheck 3-4 weeks.

## 2023-04-17 NOTE — Telephone Encounter (Signed)
Referral added

## 2023-04-25 ENCOUNTER — Other Ambulatory Visit: Payer: Self-pay | Admitting: Family

## 2023-04-25 DIAGNOSIS — F411 Generalized anxiety disorder: Secondary | ICD-10-CM

## 2023-04-25 DIAGNOSIS — Z79899 Other long term (current) drug therapy: Secondary | ICD-10-CM

## 2023-05-04 ENCOUNTER — Other Ambulatory Visit: Payer: Self-pay | Admitting: Family Medicine

## 2023-05-04 DIAGNOSIS — F319 Bipolar disorder, unspecified: Secondary | ICD-10-CM

## 2023-05-06 ENCOUNTER — Encounter: Payer: Self-pay | Admitting: Family Medicine

## 2023-05-07 ENCOUNTER — Encounter (HOSPITAL_BASED_OUTPATIENT_CLINIC_OR_DEPARTMENT_OTHER): Payer: Federal, State, Local not specified - PPO | Admitting: Obstetrics & Gynecology

## 2023-05-07 ENCOUNTER — Other Ambulatory Visit: Payer: Self-pay | Admitting: Family Medicine

## 2023-05-07 DIAGNOSIS — Z79899 Other long term (current) drug therapy: Secondary | ICD-10-CM

## 2023-05-07 DIAGNOSIS — F430 Acute stress reaction: Secondary | ICD-10-CM

## 2023-05-07 MED ORDER — ALPRAZOLAM 2 MG PO TABS: 2.0000 mg | ORAL_TABLET | Freq: Every evening | ORAL | 1 refills | Status: DC | PRN

## 2023-05-07 NOTE — Telephone Encounter (Signed)
Called and spoke with patient. Virtual visit and in person visit have both been scheduled.

## 2023-05-12 NOTE — Progress Notes (Unsigned)
 MyChart Video Visit    Virtual Visit via Video Note   This patient is at least at moderate risk for complications without adequate follow up. This format is felt to be most appropriate for this patient at this time. Physical exam was limited by quality of the video and audio technology used for the visit. [name], CMA was able to get the patient set up on a video visit.  Patient location: home Patient and provider in visit Provider location: Office  I discussed the limitations of evaluation and management by telemedicine and the availability of in person appointments. The patient expressed understanding and agreed to proceed.  Visit Date: 05/14/2023  Today's healthcare provider: Danise Edge, MD     Subjective:    Patient ID: Jillian Reyes, female    DOB: 1990-05-05, 33 y.o.   MRN: [id number]  No chief complaint on file.   HPI Discussed the use of AI scribe software for clinical note transcription with the patient, who gave verbal consent to proceed.  History of Present Illness            Past Medical History:  Diagnosis Date  . Acne   . Bipolar 1 disorder (HCC)   . Chronic back pain   . Chronic pelvic pain in female   . Chronic tension-type headache, intractable   . Dyslipidemia 02/18/2013  . Eczema   . GAD (generalized anxiety disorder)   . History of abnormal cervical Pap smear   . History of chlamydia 2015  . History of gestational diabetes   . Irritable bowel syndrome with constipation    followed by [facility] in [place]  . Migraine without aura and without status migrainosus, not intractable    neurologist--- dr Everlena Cooper  . Mild intermittent asthma    followed by pcp  . Mild obstructive sleep apnea    sleep study in epic 05-10-2022,  no cpap recommendation  . PCOS (polycystic ovarian syndrome)   . Right ovarian cyst   . Vitamin D deficiency   . Wears glasses     Past Surgical History:  Procedure Laterality Date  . BREAST  REDUCTION SURGERY Bilateral 01/05/2021   Procedure: BREAST REDUCTION WITH LIPOSUCTION;  Surgeon: Peggye Form, DO;  Location: [facility];  Service: Plastics;  Laterality: Bilateral;  . CYSTOSCOPY N/A 04/09/2023   Procedure: CYSTOSCOPY;  Surgeon: Jerene Bears, MD;  Location: [facility];  Service: Gynecology;  Laterality: N/A;  . HYSTEROSCOPY WITH NOVASURE N/A 07/06/2020   Procedure: HYSTEROSCOPY WITH NOVASURE ABLATION, DILITATION AND CURETTAGE;  Surgeon: Jerene Bears, MD;  Location: [facility];  Service: Gynecology;  Laterality: N/A;  . INCISION AND DRAINAGE Right 01/12/2019   Procedure: INCISION AND DRAINAGE OF LESION;  Surgeon: Jerene Bears, MD;  Location: [facility];  Service: Gynecology;  Laterality: Right;  . KNEE ARTHROSCOPY Right 04-16-2019  @[facility]   . LAPAROSCOPY N/A 01/12/2019   Procedure: LAPAROSCOPY DIAGNOSTIC; LYSIS OF ADHESIONS;  Surgeon: Jerene Bears, MD;  Location[facility];  Service: Gynecology;  Laterality: N/A;  possible treatement of endometriosis  . TOTAL LAPAROSCOPIC HYSTERECTOMY WITH BILATERAL SALPINGO OOPHORECTOMY Bilateral 04/09/2023   Procedure: TOTAL LAPAROSCOPIC HYSTERECTOMY WITH SALPINGECTOMY;  Surgeon: Jerene Bears, MD;  Location: [facility];  Service: Gynecology;  Laterality: Bilateral;  . WISDOM TOOTH EXTRACTION  2016    Family History  Problem Relation Age of Onset  . Heart disease Other   . Diabetes  Father 35       type 2  . Thyroid disease Father   . Brain cancer Father   . Cancer Father        tumor of the brain cancer  . Migraines Mother   . Kidney disease Mother   . Colon polyps Mother   . Hyperlipidemia Other        great grandparents    Social History   Socioeconomic History  . Marital status: Married    Spouse name: Not on file  . Number of children: 1  . Years of education: Not on file  . Highest education level: Not on file   Occupational History  . Occupation: clerck     Comment: [employer]  Tobacco Use  . Smoking status: Former    Current packs/day: 0.00    Types: Cigarettes    Start date: 06/28/2005    Quit date: 06/29/2018    Years since quitting: 4.8  . Smokeless tobacco: Never  . Tobacco comments:    Nicotine gum occassionally  Vaping Use  . Vaping status: Former  . Devices: quit 2021  Substance and Sexual Activity  . Alcohol use: Not Currently  . Drug use: Never  . Sexual activity: Yes    Partners: Male    Comment: husband had vasectomy   Other Topics Concern  . Not on file  Social History Narrative   Are you right handed or left handed? 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   Who lives with you? married   What type of home do you live in: 1 story or 2 story? 2 story home      Social Drivers of Corporate investment banker Strain: Not on file  Food Insecurity: Not on file  Transportation Needs: Not on file  Physical Activity: Not on file  Stress: Not on file  Social Connections: Unknown (07/17/2021)   [facility]   Social Network   . Social Network: Not on file  Intimate Partner Violence: Unknown (06/22/2021)   [facility] from [facility]   HITS   . Physically Hurt: Not on file   . Insult or Talk Down To: Not on file   . Threaten Physical Harm: Not on file   . Scream or Curse: Not on file    Outpatient Medications Prior to Visit  Medication Sig Dispense Refill  . acetaminophen (TYLENOL) 500 MG tablet Take 1,000 mg by mouth every 6 (six) hours as needed.    Marland Kitchen albuterol (VENTOLIN HFA) 108 (90 Base) MCG/ACT inhaler Inhale 2 puffs into the lungs every 6 (six) hours as needed for wheezing or shortness of breath. (Patient taking differently: Inhale 2 puffs into the lungs every 6 (six) hours as needed for wheezing or shortness of breath.) 18 g 2   . alprazolam (XANAX) 2 MG tablet Take 1 tablet (2 mg total) by mouth at bedtime as needed. for sleep. 30 tablet 1  . EUCRISA 2 % OINT Apply topically as needed.    Marland Kitchen ibuprofen (ADVIL) 800 MG tablet Take 1 tablet (800 mg total) by mouth every 8 (eight) hours as needed. 30 tablet 0  . lamoTRIgine (LAMICTAL) 200 MG tablet TAKE 1 TABLET (200 MG TOTAL) BY MOUTH DAILY. NEEDS APPT FOR REFILLS 90 tablet 0  . LINZESS 145 MCG CAPS capsule  Take 145 mcg by mouth daily before breakfast.    . metFORMIN (GLUCOPHAGE) 500 MG tablet Take 1 tablet (500 mg total) by mouth daily with breakfast. (Patient taking differently: Take 500 mg by mouth daily with breakfast.) 90 tablet 3  . metroNIDAZOLE (METROCREAM) 0.75 % cream Apply topically 2 (two) times daily as needed.    . Multiple Vitamin (MULTIVITAMIN ADULT PO) Take 1 tablet by mouth 2 (two) times a week.    . ondansetron (ZOFRAN-ODT) 8 MG disintegrating tablet Take 1 tablet (8 mg total) by mouth every 8 (eight) hours as needed for nausea or vomiting. 20 tablet 0  . spironolactone (ALDACTONE) 50 MG tablet Take 100 mg by mouth at bedtime. For acne    . Ubrogepant (UBRELVY) 100 MG TABS Take 1 tablet (100 mg total) by mouth as needed. May repeat after 2 hours.  Maximum 2 tablets in 24 hours. (Patient taking differently: Take 1 tablet by mouth as needed. May repeat after 2 hours.  Maximum 2 tablets in 24 hours.) 10 tablet 11   No facility-administered medications prior to visit.    Allergies  Allergen Reactions  . Cymbalta [Duloxetine Hcl] Nausea And Vomiting    Review of Systems  Constitutional:  Negative for fever and malaise/fatigue.  HENT:  Negative for congestion.   Eyes:  Negative for blurred vision.  Respiratory:  Negative for shortness of breath.   Cardiovascular:  Negative for chest pain, palpitations and leg swelling.  Gastrointestinal:  Negative for abdominal pain, blood in stool and nausea.  Genitourinary:  Negative for dysuria and frequency.   Musculoskeletal:  Negative for falls.  Skin:  Negative for rash.  Neurological:  Negative for dizziness, loss of consciousness and headaches.  Endo/Heme/Allergies:  Negative for environmental allergies.  Psychiatric/Behavioral:  Negative for depression. The patient is not nervous/anxious.   All other systems reviewed and are negative.     Objective:    Physical Exam Constitutional:      General: She is not in acute distress.    Appearance: Normal appearance. She is not ill-appearing or toxic-appearing.  HENT:     Head: Normocephalic and atraumatic.     Right Ear: External ear normal.     Left Ear: External ear normal.     Nose: Nose normal.  Eyes:     General:        Right eye: No discharge.        Left eye: No discharge.  Pulmonary:     Effort: Pulmonary effort is normal.  Skin:    Findings: No rash.  Neurological:     Mental Status: She is alert and oriented to person, place, and time.  Psychiatric:        Behavior: Behavior normal.   There were no vitals taken for this visit. Wt Readings from Last 3 Encounters:  04/09/23 167 lb 8 oz (76 kg)  02/20/23 167 lb 12.8 oz (76.1 kg)  01/23/23 163 lb 3.2 oz (74 kg)       Assessment & Plan:  Bipolar 1 disorder (HCC) Assessment & Plan: Continues to manage her stress with minimal meds   Chronic pelvic pain in female Assessment & Plan: S/p TAH   Vitamin D deficiency Assessment & Plan: Supplement and monitor       Assessment and Plan              I discussed the assessment and treatment plan with the patient. The patient was provided an opportunity to ask questions and all  were answered. The patient agreed with the plan and demonstrated an understanding of the instructions.   The patient was advised to call back or seek an in-person evaluation if the symptoms worsen or if the condition fails to improve as anticipated.  Danise Edge, MD [facility] [phone]  (phone) [phone] (fax)  [facility]

## 2023-05-12 NOTE — Assessment & Plan Note (Signed)
S/p TAH

## 2023-05-12 NOTE — Assessment & Plan Note (Signed)
 Continues to manage her stress with minimal meds

## 2023-05-12 NOTE — Assessment & Plan Note (Signed)
 Supplement and monitor

## 2023-05-14 ENCOUNTER — Encounter: Payer: Self-pay | Admitting: Family Medicine

## 2023-05-14 ENCOUNTER — Ambulatory Visit (HOSPITAL_BASED_OUTPATIENT_CLINIC_OR_DEPARTMENT_OTHER): Payer: Federal, State, Local not specified - PPO | Admitting: Obstetrics & Gynecology

## 2023-05-14 ENCOUNTER — Encounter (HOSPITAL_BASED_OUTPATIENT_CLINIC_OR_DEPARTMENT_OTHER): Payer: Self-pay | Admitting: Obstetrics & Gynecology

## 2023-05-14 ENCOUNTER — Telehealth (INDEPENDENT_AMBULATORY_CARE_PROVIDER_SITE_OTHER): Payer: Federal, State, Local not specified - PPO | Admitting: Family Medicine

## 2023-05-14 VITALS — BP 107/65 | HR 82 | Ht 64.0 in | Wt 162.6 lb

## 2023-05-14 VITALS — Ht 64.0 in | Wt 160.0 lb

## 2023-05-14 DIAGNOSIS — F319 Bipolar disorder, unspecified: Secondary | ICD-10-CM

## 2023-05-14 DIAGNOSIS — E559 Vitamin D deficiency, unspecified: Secondary | ICD-10-CM

## 2023-05-14 DIAGNOSIS — F411 Generalized anxiety disorder: Secondary | ICD-10-CM

## 2023-05-14 DIAGNOSIS — R102 Pelvic and perineal pain unspecified side: Secondary | ICD-10-CM

## 2023-05-14 DIAGNOSIS — G8929 Other chronic pain: Secondary | ICD-10-CM

## 2023-05-14 DIAGNOSIS — Z0000 Encounter for general adult medical examination without abnormal findings: Secondary | ICD-10-CM

## 2023-05-14 DIAGNOSIS — F430 Acute stress reaction: Secondary | ICD-10-CM

## 2023-05-14 DIAGNOSIS — Z9889 Other specified postprocedural states: Secondary | ICD-10-CM

## 2023-05-14 MED ORDER — SPIRONOLACTONE 50 MG PO TABS: 150.0000 mg | ORAL_TABLET | Freq: Every day | ORAL | 1 refills | Status: DC

## 2023-05-14 NOTE — Progress Notes (Signed)
 GYNECOLOGY  VISIT  CC:   post op recheck  HPI: 33 y.o. G21P1011 Married Other or two or more races female here for recheck after undergoing TLH/bilateral salpingectomy/cystoscopy on 04/09/2023.  She reports bleeding is none.  She has seen improvement in the cramping, pelvic pain that she was experiencing prior to surgery.  Bowel function is Normal.  Bladder function is normal.   MEDS:   Current Outpatient Medications on File Prior to Visit  Medication Sig Dispense Refill   acetaminophen (TYLENOL) 500 MG tablet Take 1,000 mg by mouth every 6 (six) hours as needed.     albuterol (VENTOLIN HFA) 108 (90 Base) MCG/ACT inhaler Inhale 2 puffs into the lungs every 6 (six) hours as needed for wheezing or shortness of breath. (Patient taking differently: Inhale 2 puffs into the lungs every 6 (six) hours as needed for wheezing or shortness of breath.) 18 g 2   alprazolam (XANAX) 2 MG tablet Take 1 tablet (2 mg total) by mouth at bedtime as needed. for sleep. 30 tablet 1   lamoTRIgine (LAMICTAL) 200 MG tablet TAKE 1 TABLET (200 MG TOTAL) BY MOUTH DAILY. NEEDS APPT FOR REFILLS 90 tablet 0   LINZESS 145 MCG CAPS capsule Take 145 mcg by mouth daily before breakfast.     metFORMIN (GLUCOPHAGE) 500 MG tablet Take 1 tablet (500 mg total) by mouth daily with breakfast. (Patient taking differently: Take 500 mg by mouth daily with breakfast.) 90 tablet 3   ondansetron (ZOFRAN-ODT) 8 MG disintegrating tablet Take 1 tablet (8 mg total) by mouth every 8 (eight) hours as needed for nausea or vomiting. 20 tablet 0   Ubrogepant (UBRELVY) 100 MG TABS Take 1 tablet (100 mg total) by mouth as needed. May repeat after 2 hours.  Maximum 2 tablets in 24 hours. (Patient taking differently: Take 1 tablet by mouth as needed. May repeat after 2 hours.  Maximum 2 tablets in 24 hours.) 10 tablet 11   No current facility-administered medications on file prior to visit.    SH:  Smoking No    PHYSICAL EXAMINATION:    BP 107/65 (Cuff  Size: Normal)   Pulse 82   Ht 5\' 4"  (1.626 m)   Wt 162 lb 9.6 oz (73.8 kg)   LMP 07/05/2020 (Exact Date)   BMI 27.91 kg/m     General appearance: alert, cooperative and appears stated age CV:  Regular rate and rhythm Lungs:  clear to auscultation, no wheezes, rales or rhonchi, symmetric air entry Abdomen: soft, non-tender; bowel sounds normal; no masses,  no organomegaly Incisions:  C/D/I  Pelvic: External genitalia:  no lesions              Urethra:  normal appearing urethra with no masses, tenderness or lesions              Bartholins and Skenes: normal                 Vagina: normal appearing vagina with normal color and discharge, no lesions              Cervix: absent              Bimanual Exam:  Uterus:  uterus absent              Adnexa: no mass, fullness, tenderness  Chaperone, Raechel Ache, RN, was present for exam.  Assessment/Plan: 1. Post-operative state (Primary) - doing well. Restrictions discussed.  Aware to continue pelvic rest until full twelve weeks.

## 2023-05-14 NOTE — Progress Notes (Signed)
 Virtual Visit via Video Note   This patient is at least at moderate risk for complications without adequate follow up. This format is felt to be most appropriate for this patient at this time. Physical exam was limited by quality of the video and audio technology used for the visit. Jillian Reyes, cma was able to get the patient set up on a video visit.  Patient location: home Patient and provider in visit Provider location: Office  I discussed the limitations of evaluation and management by telemedicine and the availability of in person appointments. The patient expressed understanding and agreed to proceed.  Visit Date: 05/14/2023  Today's healthcare provider: Danise Edge, MD  Subjective:    Patient ID: Jillian Reyes, female    DOB: 1990/12/03, 33 y.o.   MRN: [id number]  Chief Complaint  Patient presents with  . Follow-up    HPI Discussed the use of AI scribe software for clinical note transcription with the patient, who gave verbal consent to proceed.  History of Present Illness Jillian Reyes is a 33 year old female with hyperaldosteronism who presents for medication management and follow-up.  She is currently taking spironolactone 100 mg at bedtime for hyperaldosteronism. She experiences symptoms such as acne, puffiness, and redness, which may necessitate a dosage adjustment.  She has a history of taking nortriptyline, which resulted in excessive fatigue and vivid dreams, leading to its discontinuation. She described the experience as 'super groggy' and 'brutal.' While on nortriptyline, she did not take Xanax at night due to its sedative effects. This is noted as an intolerance rather than an allergy.  Her current medications include Lamictal and alprazolam, which she has been stable on.    Past Medical History:  Diagnosis Date  . Acne   . Bipolar 1 disorder (HCC)   . Chronic back pain   . Chronic pelvic pain in female   . Chronic tension-type headache, intractable    . Dyslipidemia 02/18/2013  . Eczema   . GAD (generalized anxiety disorder)   . History of abnormal cervical Pap smear   . History of chlamydia 2015  . History of gestational diabetes   . Irritable bowel syndrome with constipation    followed by [facility] in [place]  . Migraine without aura and without status migrainosus, not intractable    neurologist--- dr Everlena Cooper  . Mild intermittent asthma    followed by pcp  . Mild obstructive sleep apnea    sleep study in epic 05-10-2022,  no cpap recommendation  . PCOS (polycystic ovarian syndrome)   . Right ovarian cyst   . Vitamin D deficiency   . Wears glasses     Past Surgical History:  Procedure Laterality Date  . BREAST REDUCTION SURGERY Bilateral 01/05/2021   Procedure: BREAST REDUCTION WITH LIPOSUCTION;  Surgeon: Peggye Form, DO;  Location: [facility];  Service: Plastics;  Laterality: Bilateral;  . CYSTOSCOPY N/A 04/09/2023   Procedure: CYSTOSCOPY;  Surgeon: Jerene Bears, MD;  Location: [facility]  Service: Gynecology;  Laterality: N/A;  . HYSTEROSCOPY WITH NOVASURE N/A 07/06/2020   Procedure: HYSTEROSCOPY WITH NOVASURE ABLATION, DILITATION AND CURETTAGE;  Surgeon: Jerene Bears, MD;  Location: [facility];  Service: Gynecology;  Laterality: N/A;  . INCISION AND DRAINAGE Right 01/12/2019   Procedure: INCISION AND DRAINAGE OF LESION;  Surgeon: Jerene Bears, MD;  Location: [facility];  Service: Gynecology;  Laterality: Right;  . KNEE ARTHROSCOPY Right 04-16-2019  @  [facility]  . LAPAROSCOPY N/A 01/12/2019   Procedure: LAPAROSCOPY DIAGNOSTIC; LYSIS OF ADHESIONS;  Surgeon: Jerene Bears, MD;  Location: [facility];  Service: Gynecology;  Laterality: N/A;  possible treatement of endometriosis  . TOTAL LAPAROSCOPIC HYSTERECTOMY WITH BILATERAL SALPINGO OOPHORECTOMY Bilateral 04/09/2023   Procedure: TOTAL LAPAROSCOPIC HYSTERECTOMY WITH  SALPINGECTOMY;  Surgeon: Jerene Bears, MD;  Location: [facility];  Service: Gynecology;  Laterality: Bilateral;  . WISDOM TOOTH EXTRACTION  2016    Family History  Problem Relation Age of Onset  . Heart disease Other   . Diabetes Father 69       type 2  . Thyroid disease Father   . Brain cancer Father   . Cancer Father        tumor of the brain cancer  . Migraines Mother   . Kidney disease Mother   . Colon polyps Mother   . Hyperlipidemia Other        great grandparents    Social History   Socioeconomic History  . Marital status: Married    Spouse name: Not on file  . Number of children: 1  . Years of education: Not on file  . Highest education level: Not on file  Occupational History  . Occupation: clerck     Comment: [employer]  Tobacco Use  . Smoking status: Former    Current packs/day: 0.00    Types: Cigarettes    Start date: 06/28/2005    Quit date: 06/29/2018    Years since quitting: 4.8  . Smokeless tobacco: Never  . Tobacco comments:    Nicotine gum occassionally  Vaping Use  . Vaping status: Former  . Devices: quit 2021  Substance and Sexual Activity  . Alcohol use: Not Currently  . Drug use: Never  . Sexual activity: Not Currently    Partners: Male    Comment: husband had vasectomy   Other Topics Concern  . Not on file  Social History Narrative   Are you right handed or left handed? 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   Who lives with you? married   What type of home do you live in: 1 story or 2 story? 2 story home      Social Drivers of Corporate investment banker Strain: Not on file  Food Insecurity: Not on file  Transportation Needs: Not on file  Physical Activity: Not on file  Stress: Not on file  Social Connections: Unknown (07/17/2021)   [facility], [facility]   Social Network   . Social Network:  Not on file  Intimate Partner Violence: Unknown (06/22/2021)   [facility], [facility]   HITS   . Physically Hurt: Not on file   . Insult or Talk Down To: Not on file   . Threaten Physical Harm: Not on file   . Scream or Curse: Not on file    Outpatient Medications Prior to Visit  Medication Sig Dispense Refill  . acetaminophen (TYLENOL) 500 MG tablet Take 1,000 mg by mouth every 6 (six) hours as needed.    Marland Kitchen albuterol (VENTOLIN HFA) 108 (90 Base) MCG/ACT inhaler Inhale 2 puffs into the lungs every 6 (six) hours as needed for wheezing or shortness of breath. (Patient taking differently: Inhale 2 puffs into the lungs every 6 (six)  hours as needed for wheezing or shortness of breath.) 18 g 2  . alprazolam (XANAX) 2 MG tablet Take 1 tablet (2 mg total) by mouth at bedtime as needed. for sleep. 30 tablet 1  . lamoTRIgine (LAMICTAL) 200 MG tablet TAKE 1 TABLET (200 MG TOTAL) BY MOUTH DAILY. NEEDS APPT FOR REFILLS 90 tablet 0  . LINZESS 145 MCG CAPS capsule Take 145 mcg by mouth daily before breakfast.    . metFORMIN (GLUCOPHAGE) 500 MG tablet Take 1 tablet (500 mg total) by mouth daily with breakfast. (Patient taking differently: Take 500 mg by mouth daily with breakfast.) 90 tablet 3  . ondansetron (ZOFRAN-ODT) 8 MG disintegrating tablet Take 1 tablet (8 mg total) by mouth every 8 (eight) hours as needed for nausea or vomiting. 20 tablet 0  . Ubrogepant (UBRELVY) 100 MG TABS Take 1 tablet (100 mg total) by mouth as needed. May repeat after 2 hours.  Maximum 2 tablets in 24 hours. (Patient taking differently: Take 1 tablet by mouth as needed. May repeat after 2 hours.  Maximum 2 tablets in 24 hours.) 10 tablet 11  . EUCRISA 2 % OINT Apply topically as needed.    Marland Kitchen ibuprofen (ADVIL) 800 MG tablet Take 1 tablet (800 mg total) by mouth every 8 (eight) hours as needed. 30 tablet 0  . metroNIDAZOLE (METROCREAM) 0.75 % cream Apply topically 2 (two) times daily as needed.    . Multiple  Vitamin (MULTIVITAMIN ADULT PO) Take 1 tablet by mouth 2 (two) times a week.    . spironolactone (ALDACTONE) 50 MG tablet Take 100 mg by mouth at bedtime. For acne     No facility-administered medications prior to visit.    Allergies  Allergen Reactions  . Cymbalta [Duloxetine Hcl] Nausea And Vomiting  . Nortriptyline Other (See Comments)    Excessive fatigue    Review of Systems  Constitutional:  Positive for malaise/fatigue. Negative for fever.  HENT:  Negative for congestion.   Eyes:  Negative for blurred vision.  Respiratory:  Negative for shortness of breath.   Cardiovascular:  Negative for chest pain, palpitations and leg swelling.  Gastrointestinal:  Negative for abdominal pain, blood in stool and nausea.  Genitourinary:  Negative for dysuria and frequency.  Musculoskeletal:  Negative for falls.  Skin:  Negative for rash.  Neurological:  Negative for dizziness, loss of consciousness and headaches.  Endo/Heme/Allergies:  Negative for environmental allergies.  Psychiatric/Behavioral:  Negative for depression. The patient is nervous/anxious.       Objective:    Physical Exam Constitutional:      General: She is not in acute distress.    Appearance: Normal appearance. She is not ill-appearing or toxic-appearing.  HENT:     Head: Normocephalic and atraumatic.     Right Ear: External ear normal.     Left Ear: External ear normal.     Nose: Nose normal.  Eyes:     General:        Right eye: No discharge.        Left eye: No discharge.  Pulmonary:     Effort: Pulmonary effort is normal.  Skin:    Findings: No rash.  Neurological:     Mental Status: She is alert and oriented to person, place, and time.  Psychiatric:        Behavior: Behavior normal.   Ht 5\' 4"  (1.626 m)   Wt 160 lb (72.6 kg)   LMP 07/05/2020 (Exact Date)   BMI 27.46 kg/m  Wt Readings from Last 3 Encounters:  05/14/23 162 lb 9.6 oz (73.8 kg)  05/14/23 160 lb (72.6 kg)  04/09/23 167 lb 8 oz (76  kg)    Diabetic Foot Exam - Simple   No data filed    Lab Results  Component Value Date   WBC 7.3 04/02/2023   HGB 14.1 04/02/2023   HCT 42.5 04/02/2023   PLT 290 04/02/2023   GLUCOSE 102 (H) 01/21/2023   CHOL 154 11/02/2022   TRIG 122.0 11/02/2022   HDL 46.80 11/02/2022   LDLCALC 82 11/02/2022   ALT 17 01/21/2023   AST 15 01/21/2023   NA 136 01/21/2023   K 4.1 01/21/2023   CL 103 01/21/2023   CREATININE 0.91 01/21/2023   BUN 13 01/21/2023   CO2 25 01/21/2023   TSH 1.47 11/02/2022   HGBA1C 5.1 11/02/2022   MICROALBUR 0.8 03/31/2015    Lab Results  Component Value Date   TSH 1.47 11/02/2022   Lab Results  Component Value Date   WBC 7.3 04/02/2023   HGB 14.1 04/02/2023   HCT 42.5 04/02/2023   MCV 93.6 04/02/2023   PLT 290 04/02/2023   Lab Results  Component Value Date   NA 136 01/21/2023   K 4.1 01/21/2023   CO2 25 01/21/2023   GLUCOSE 102 (H) 01/21/2023   BUN 13 01/21/2023   CREATININE 0.91 01/21/2023   BILITOT 0.5 01/21/2023   ALKPHOS 55 01/21/2023   AST 15 01/21/2023   ALT 17 01/21/2023   PROT 7.4 01/21/2023   ALBUMIN 4.7 01/21/2023   CALCIUM 9.5 01/21/2023   ANIONGAP 8 01/21/2023   GFR 84.27 11/02/2022   Lab Results  Component Value Date   CHOL 154 11/02/2022   Lab Results  Component Value Date   HDL 46.80 11/02/2022   Lab Results  Component Value Date   LDLCALC 82 11/02/2022   Lab Results  Component Value Date   TRIG 122.0 11/02/2022   Lab Results  Component Value Date   CHOLHDL 3 11/02/2022   Lab Results  Component Value Date   HGBA1C 5.1 11/02/2022       Assessment & Plan:  Bipolar 1 disorder (HCC) Assessment & Plan: Continues to manage her stress with minimal meds  Orders: -     Drug Monitoring Panel [id number] , Urine; Future  Chronic pelvic pain in female Assessment & Plan: S/p TAH   Vitamin D deficiency Assessment & Plan: Supplement and monitor   Orders: -     VITAMIN D 25 Hydroxy (Vit-D Deficiency,  Fractures); Future  Preventative health care -     Comprehensive metabolic panel; Future  Anxiety as acute reaction to exceptional stress -     Drug Monitoring Panel [phone] , Urine; Future  Other orders -     Spironolactone; Take 3 tablets (150 mg total) by mouth at bedtime. For acne  Dispense: 270 tablet; Refill: 1    Assessment and Plan Assessment & Plan Hyperaldosteronism Currently on Spironolactone 100mg  at bedtime. Discussed increasing the dose to manage symptoms of acne and facial puffiness. -Increase Spironolactone to 150mg  daily (3 tablets of 50mg  each). -Order blood work now and repeat in 4-6 weeks after dose increase to monitor potassium levels and kidney function.  Medication Management Patient is also on Lamictal and Alprazolam, managed by psychiatry. Patient expressed dissatisfaction with current psychiatric care. -Continue to manage Lamictal and Alprazolam prescriptions in the interim. -Ensure patient has follow-up appointments every 6 months due to Xanax prescription.  General  Health Maintenance Upcoming physical scheduled for July. -Confirm if the July appointment is a physical. If not, schedule a physical 4-6 months after the July appointment. -Order lipid panel and Vitamin D level during the physical.  Family Care Patient's stepdaughter moved in and needs a primary care provider. -Advise patient to call the office to establish care for the stepdaughter with Shanda Bumps, the new Nurse Practitioner, or another provider in the office.     Danise Edge, MD

## 2023-05-15 ENCOUNTER — Other Ambulatory Visit: Payer: Federal, State, Local not specified - PPO

## 2023-05-22 ENCOUNTER — Encounter: Payer: Self-pay | Admitting: Family Medicine

## 2023-05-22 ENCOUNTER — Other Ambulatory Visit (INDEPENDENT_AMBULATORY_CARE_PROVIDER_SITE_OTHER): Payer: Federal, State, Local not specified - PPO

## 2023-05-22 DIAGNOSIS — F411 Generalized anxiety disorder: Secondary | ICD-10-CM

## 2023-05-22 DIAGNOSIS — E559 Vitamin D deficiency, unspecified: Secondary | ICD-10-CM

## 2023-05-22 DIAGNOSIS — Z0000 Encounter for general adult medical examination without abnormal findings: Secondary | ICD-10-CM

## 2023-05-22 DIAGNOSIS — F319 Bipolar disorder, unspecified: Secondary | ICD-10-CM

## 2023-05-22 LAB — COMPREHENSIVE METABOLIC PANEL
ALT: 11 U/L (ref 0–35)
AST: 11 U/L (ref 0–37)
Albumin: 4.5 g/dL (ref 3.5–5.2)
Alkaline Phosphatase: 53 U/L (ref 39–117)
BUN: 10 mg/dL (ref 6–23)
CO2: 25 meq/L (ref 19–32)
Calcium: 9.4 mg/dL (ref 8.4–10.5)
Chloride: 104 meq/L (ref 96–112)
Creatinine, Ser: 0.92 mg/dL (ref 0.40–1.20)
GFR: 81.76 mL/min (ref >60.00)
Glucose, Bld: 94 mg/dL (ref 70–99)
Potassium: 4.2 meq/L (ref 3.5–5.1)
Sodium: 137 meq/L (ref 135–145)
Total Bilirubin: 0.6 mg/dL (ref 0.2–1.2)
Total Protein: 6.8 g/dL (ref 6.0–8.3)

## 2023-05-22 LAB — VITAMIN D 25 HYDROXY (VIT D DEFICIENCY, FRACTURES): VITD: 28.79 ng/mL — ABNORMAL LOW (ref 30.00–100.00)

## 2023-05-24 LAB — DRUG MONITORING PANEL 376104, URINE

## 2023-05-24 LAB — DM TEMPLATE

## 2023-06-01 ENCOUNTER — Encounter (HOSPITAL_BASED_OUTPATIENT_CLINIC_OR_DEPARTMENT_OTHER): Payer: Self-pay | Admitting: Obstetrics & Gynecology

## 2023-06-25 ENCOUNTER — Ambulatory Visit: Payer: Federal, State, Local not specified - PPO | Attending: Obstetrics & Gynecology | Admitting: Physical Therapy

## 2023-06-25 ENCOUNTER — Encounter: Payer: Self-pay | Admitting: Physical Therapy

## 2023-06-25 ENCOUNTER — Other Ambulatory Visit: Payer: Self-pay

## 2023-06-25 DIAGNOSIS — M6281 Muscle weakness (generalized): Secondary | ICD-10-CM | POA: Insufficient documentation

## 2023-06-25 DIAGNOSIS — R279 Unspecified lack of coordination: Secondary | ICD-10-CM | POA: Insufficient documentation

## 2023-06-25 DIAGNOSIS — R102 Pelvic and perineal pain: Secondary | ICD-10-CM | POA: Diagnosis not present

## 2023-06-25 DIAGNOSIS — R293 Abnormal posture: Secondary | ICD-10-CM | POA: Insufficient documentation

## 2023-06-25 NOTE — Therapy (Signed)
 OUTPATIENT PHYSICAL THERAPY FEMALE PELVIC EVALUATION   Patient Name: Jillian Reyes MRN: [id number] DOB:02-03-1991, 33 y.o., female Today's Date: 06/25/2023  END OF SESSION:  PT End of Session - 06/25/23 1048     Visit Number 1    Number of Visits 10    Date for PT Re-Evaluation 09/24/23    Authorization Type [employer]    PT Start Time 1015    PT Stop Time 1100    PT Time Calculation (min) 45 min    Activity Tolerance Patient tolerated treatment well    Behavior During Therapy WFL for tasks assessed/performed             Past Medical History:  Diagnosis Date   Acne    Bipolar 1 disorder (HCC)    Chronic back pain    Chronic pelvic pain in female    Chronic tension-type headache, intractable    Dyslipidemia 02/18/2013   Eczema    GAD (generalized anxiety disorder)    History of abnormal cervical Pap smear    History of chlamydia 2015   History of gestational diabetes    Irritable bowel syndrome with constipation    followed by [facility] in [place]   Migraine without aura and without status migrainosus, not intractable    neurologist--- dr Everlena Cooper   Mild intermittent asthma    followed by pcp   Mild obstructive sleep apnea    sleep study in epic 05-10-2022,  no cpap recommendation   PCOS (polycystic ovarian syndrome)    Right ovarian cyst    Vitamin D deficiency    Wears glasses    Past Surgical History:  Procedure Laterality Date   BREAST REDUCTION SURGERY Bilateral 01/05/2021   Procedure: BREAST REDUCTION WITH LIPOSUCTION;  Surgeon: Peggye Form, DO;  Location: [facility];  Service: Plastics;  Laterality: Bilateral;   CYSTOSCOPY N/A 04/09/2023   Procedure: CYSTOSCOPY;  Surgeon: Jerene Bears, MD;  Location: [facility];  Service: Gynecology;  Laterality: N/A;   HYSTEROSCOPY WITH NOVASURE N/A 07/06/2020   Procedure: HYSTEROSCOPY WITH NOVASURE ABLATION, DILITATION AND CURETTAGE;  Surgeon: Jerene Bears, MD;   Location: [facility];  Service: Gynecology;  Laterality: N/A;   INCISION AND DRAINAGE Right 01/12/2019   Procedure: INCISION AND DRAINAGE OF LESION;  Surgeon: Jerene Bears, MD;  Location: [facility];  Service: Gynecology;  Laterality: Right;   KNEE ARTHROSCOPY Right 04-16-2019  @[facility]    LAPAROSCOPY N/A 01/12/2019   Procedure: LAPAROSCOPY DIAGNOSTIC; LYSIS OF ADHESIONS;  Surgeon: Jerene Bears, MD;  Location: [facility] ;  Service: Gynecology;  Laterality: N/A;  possible treatement of endometriosis   TOTAL LAPAROSCOPIC HYSTERECTOMY WITH BILATERAL SALPINGO OOPHORECTOMY Bilateral 04/09/2023   Procedure: TOTAL LAPAROSCOPIC HYSTERECTOMY WITH SALPINGECTOMY;  Surgeon: Jerene Bears, MD;  Location: [facility];  Service: Gynecology;  Laterality: Bilateral;   WISDOM TOOTH EXTRACTION  2016   Patient Active Problem List   Diagnosis Date Noted   Pelvic pain 04/09/2023   History of endometrial ablation 01/26/2023   Chronic pelvic pain in female 01/26/2023   Symptomatic mammary hypertrophy 08/09/2020   PCOS (polycystic ovarian syndrome) 10/16/2019   Grief reaction 05/27/2019   Painful coitus, female 12/05/2018   Tachycardia 07/14/2018   Hypocalcemia 12/22/2016   Right hand pain 12/22/2016   Bilateral calf pain 12/21/2016   Right knee pain 12/21/2016   Hemorrhoids 12/01/2016   History of hidradenitis suppurativa 10/14/2016   Bilateral neck pain  03/16/2016   Preventative health care 04/10/2015   Vitamin D deficiency 04/10/2015   Dysuria 01/10/2014   Allergic rhinitis 11/30/2013   Migraine without aura and without status migrainosus, not intractable 11/17/2013   Encounter for preconception consultation 07/27/2013   Insomnia 07/02/2013   Tendinopathy of rotator cuff 03/08/2013   Dyslipidemia 02/18/2013   Eczema 01/06/2012   Acne 01/06/2012   Mid back pain    Allergy    Anxiety as acute reaction to exceptional stress 05/01/2011    Bipolar 1 disorder (HCC) 07/05/2010    PCP: Bradd Canary, MD  REFERRING PROVIDER: Jerene Bears, MD   REFERRING DIAG: R10.2 (ICD-10-CM) - Pelvic pain  THERAPY DIAG:  Muscle weakness (generalized)  Unspecified lack of coordination  Abnormal posture  Rationale for Evaluation and Treatment: Rehabilitation  ONSET DATE: 04/09/23  SUBJECTIVE:                                                                                                                                                                                           SUBJECTIVE STATEMENT: Patient reports to pelvic PT s/p total hysterectomy with bilateral oophorectomy 04/09/23 - Patient has been having random bouts of pain in the anus and pressure in the lower quadrants when she voids. She had intercourse this past week and had no pain or cramping or bleeding (during or after).  Fluid intake: drinks 3, 40oz stanleys per day  PAIN:  Are you having pain? No NPRS scale: 0/10 Pain location: Deep, Bilateral, Anal, Anterior, and Posterior  Pain type: burning, dull, and sharp Pain description: intermittent   Aggravating factors: N/A Relieving factors: heating pads  PRECAUTIONS: None  RED FLAGS: None   WEIGHT BEARING RESTRICTIONS: No  FALLS:  Has patient fallen in last 6 months? 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)   ACTIVITY LEVEL : not consistently  PLOF: Independent  PATIENT GOALS: decrease pain at rest and with activity   PERTINENT HISTORY:  Hx: anxiety, IBS-C, PCOS, ovarian cysts, total hysterectomy with bilateral oophorectomy 04/09/23 Sexual abuse: No  BOWEL MOVEMENT: Pain with bowel movement: No Type of bowel movement:Type (Bristol Stool Scale) 2, Frequency 3x/wk, Strain depends on if she is constipated, and Splinting no Fully empty rectum: No Leakage: No Pads: No Fiber supplement/laxative Yes - miralax, lynzess   URINATION: Pain with urination:  No Fully empty bladder: Yes:   Stream: Strong Urgency: Yes  Frequency: goes more often than not  Leakage: Sneezing Pads: No  INTERCOURSE:  Ability to have vaginal penetration Yes  Pain with intercourse: none DrynessYes  Climax: yes no pain Marinoff Scale:  0/3 lubricant: yes water based   PREGNANCY: Vaginal deliveries 1 Tearing Yes: stitches Episiotomy No C-section deliveries 0  PROLAPSE: None   OBJECTIVE:  Note: Objective measures were completed at Evaluation unless otherwise noted.  PATIENT SURVEYS:  PFIQ-7: 14  COGNITION: Overall cognitive status: Within functional limits for tasks assessed     SENSATION: Light touch: Appears intact  LUMBAR SPECIAL TESTS:  Single leg stance test: Positive  FUNCTIONAL TESTS:  Squat: mild dynamic knee valgus with loading, left knee pain present   GAIT: Assistive device utilized: None Comments: mild   POSTURE: rounded shoulders, forward head, and increased thoracic kyphosis   LUMBARAROM/PROM: within functional limits   A/PROM A/PROM  eval  Flexion   Extension   Right lateral flexion   Left lateral flexion   Right rotation   Left rotation    (Blank rows = not tested)  LOWER EXTREMITY ROM: within functional limits   Active ROM Right eval Left eval  Hip flexion    Hip extension    Hip abduction    Hip adduction    Hip internal rotation    Hip external rotation    Knee flexion    Knee extension    Ankle dorsiflexion    Ankle plantarflexion    Ankle inversion    Ankle eversion     (Blank rows = not tested)  LOWER EXTREMITY MMT: 4/5 bilateral knees and hips grossly   MMT Right eval Left eval  Hip flexion    Hip extension    Hip abduction    Hip adduction    Hip internal rotation    Hip external rotation    Knee flexion    Knee extension    Ankle dorsiflexion    Ankle plantarflexion    Ankle inversion    Ankle eversion     (Blank rows = not tested) PALPATION:   General: no increased muscle  tension in bilateral adductors/hip flexors   Pelvic Alignment: within normal limits   Abdominal: upper chest breathing present, decreased rib excursion with inhalation, abdominal bracing at rest                 External Perineal Exam: minimal dryness noted with sufficient clitoral hood mobility. Patient able to actively shorten and bulge pelvic floor from external perspective.                             Internal Pelvic Floor: Generally weak throughout with a significant lack of coordination present between diaphragm and pelvic floor. Patient's pelvic floor is stiff at rest and does not want to lengthen/shorten with inhalation/exhalation. With internal cueing to lengthen and expand the abdomen with inhalation, patient was able to achieve more normal AROM in the pelvic floor. No pain with today's examination.  Patient confirms identification and approves PT to assess internal pelvic floor and treatment Yes No emotional/communication barriers or cognitive limitation. Patient is motivated to learn. Patient understands and agrees with treatment goals and plan. PT explains patient will be examined in standing, sitting, and lying down to see how their muscles and joints work. When they are ready, they will be asked to remove their underwear so PT can examine their perineum. The patient is also given the option of providing their own chaperone as one is not provided in our facility. The patient also has the right and is explained the right to defer or refuse any part of the evaluation or treatment including  the internal exam. With the patient's consent, PT will use one gloved finger to gently assess the muscles of the pelvic floor, seeing how well it contracts and relaxes and if there is muscle symmetry. After, the patient will get dressed and PT and patient will discuss exam findings and plan of care. PT and patient discuss plan of care, schedule, attendance policy and HEP activities.  PELVIC MMT:   MMT  eval  Vaginal 3/5, 3 quick flicks, 3 second hold  Internal Anal Sphincter   External Anal Sphincter   Puborectalis   Diastasis Recti   (Blank rows = not tested)        TONE: Within normal limits   PROLAPSE: N/A  TODAY'S TREATMENT:                                                                                                                              DATE:   EVAL 06/25/23: Examination completed, findings reviewed, pt educated on POC, HEP, and self care. Pt motivated to participate in PT and agreeable to attempt recommendations.   Neuro re-ed: Hooklying diaphragmatic breathing + pelvic floor active range of motion with inhalation/exhalation 2x10  Self care: Vaginal moisturizers, water based vs silicone based lubricants, pelvic floor relative anatomy and the connection between the diaphragm and pelvic floor, intraabdominal pressure management and how this impacts the pelvic floor   PATIENT EDUCATION:  Education details: Vaginal moisturizers, water based vs silicone based lubricants, pelvic floor relative anatomy and the connection between the diaphragm and pelvic floor, intraabdominal pressure management and how this impacts the pelvic floor  Person educated: Patient Education method: Explanation, Demonstration, Tactile cues, Verbal cues, and Handouts Education comprehension: verbalized understanding, returned demonstration, verbal cues required, tactile cues required, and needs further education  HOME EXERCISE PROGRAM: Access Code: [email or web address] URL: [email or web address] Date: 06/25/2023 Prepared by: Earna Coder  Exercises - Supine Pelvic Floor Contraction  - 1 x daily - 7 x weekly - 2 sets - 10 reps  ASSESSMENT:  CLINICAL IMPRESSION: Patient is a 33 y.o. female who was seen today for physical therapy evaluation and treatment for pelvic pain secondary to total hysterectomy with bilateral oophorectomy 04/09/23. She is currently experiencing intermittent vaginal/anal  pain and bilateral lower abdominal quadrant pain when voiding/defecating, but she is having no pain with vaginal penetration. Internal vaginal examination performed and patient was generally weak throughout with a significant lack of coordination present between diaphragm and pelvic floor. Patient's pelvic floor is stiff at rest and does not want to lengthen/shorten with inhalation/exhalation. With internal cueing to lengthen and expand the abdomen with inhalation, patient was able to achieve more normal AROM in the pelvic floor. No pain with today's examination. Overall, patient tolerated session well and would benefit from continued skilled therapeutic intervention to address deficits.  OBJECTIVE IMPAIRMENTS: decreased coordination, decreased endurance, decreased mobility, decreased ROM, decreased strength, and pain.   ACTIVITY LIMITATIONS: lifting, bending, sitting, and squatting  PARTICIPATION LIMITATIONS:  N/A  PERSONAL FACTORS: Past/current experiences, Time since onset of injury/illness/exacerbation, and 1 comorbidity: PCOS  are also affecting patient's functional outcome.   REHAB POTENTIAL: Good  CLINICAL DECISION MAKING: Stable/uncomplicated  EVALUATION COMPLEXITY: Low   GOALS: Goals reviewed with patient? Yes  SHORT TERM GOALS: Target date: 07/23/2023  Pt will be independent with HEP.  Baseline: Goal status: INITIAL  2.  Pt will be independent with diaphragmatic breathing and down training activities in order to improve pelvic floor relaxation. Baseline:  Goal status: INITIAL  3.  Pt will be able to correctly perform diaphragmatic breathing and appropriate pressure management in order to prevent worsening vaginal wall laxity and improve pelvic floor A/ROM.   Baseline:  Goal status: INITIAL  4.  Pt will report 50% reduction of pain due to improvements in posture, strength, and muscle length in pelvic floor musculature and to improve quality of life.  Baseline:  Goal status:  INITIAL  LONG TERM GOALS: Target date: 12/25/2023   Pt will be independent with advanced HEP.  Baseline:  Goal status: INITIAL  2.  Pt to demonstrate improved coordination of pelvic floor and breathing mechanics with 10# squat with appropriate synergistic patterns to decrease pain at least 75% of the time and to improve functional mobility.   Baseline:  Goal status: INITIAL  3.  Pt will report no leaks with laughing, coughing, sneezing in order to improve comfort with interpersonal relationships and community activities.   Baseline:  Goal status: INITIAL  4.  Pt will demonstrate increase in all impaired hip strength by 1 muscle grades in order to demonstrate improved lumbopelvic support and increase functional ability.   Baseline:  Goal status: INITIAL  PLAN:  PT FREQUENCY: 1-2x/week  PT DURATION: 12 weeks  PLANNED INTERVENTIONS: 97110-Therapeutic exercises, 97530- Therapeutic activity, 97112- Neuromuscular re-education, 97535- Self Care, 04540- Manual therapy, Taping, Dry Needling, Joint mobilization, Spinal mobilization, Scar mobilization, Cryotherapy, and Moist heat  PLAN FOR NEXT SESSION: continued pelvic floor AROM in seated, introduce knack technique for stress urinary incontinence, internal manual interventions for pain relief, introduce hip and core strengthening  Omar Person, PT 06/25/2023, 11:55 AM

## 2023-06-28 ENCOUNTER — Ambulatory Visit (HOSPITAL_BASED_OUTPATIENT_CLINIC_OR_DEPARTMENT_OTHER): Payer: Federal, State, Local not specified - PPO | Admitting: Obstetrics & Gynecology

## 2023-07-24 ENCOUNTER — Other Ambulatory Visit (HOSPITAL_BASED_OUTPATIENT_CLINIC_OR_DEPARTMENT_OTHER): Payer: Self-pay | Admitting: *Deleted

## 2023-07-24 DIAGNOSIS — E282 Polycystic ovarian syndrome: Secondary | ICD-10-CM

## 2023-07-24 MED ORDER — METFORMIN HCL 500 MG PO TABS: 500.0000 mg | ORAL_TABLET | Freq: Every day | ORAL | 0 refills | Status: DC

## 2023-08-05 ENCOUNTER — Encounter (HOSPITAL_BASED_OUTPATIENT_CLINIC_OR_DEPARTMENT_OTHER): Payer: Self-pay | Admitting: Obstetrics & Gynecology

## 2023-08-06 ENCOUNTER — Ambulatory Visit (INDEPENDENT_AMBULATORY_CARE_PROVIDER_SITE_OTHER): Admitting: Physician Assistant

## 2023-08-06 ENCOUNTER — Encounter: Payer: Self-pay | Admitting: Physician Assistant

## 2023-08-06 VITALS — BP 100/60 | HR 63 | Temp 98.4°F | Ht 64.0 in | Wt 163.0 lb

## 2023-08-06 DIAGNOSIS — J029 Acute pharyngitis, unspecified: Secondary | ICD-10-CM | POA: Diagnosis not present

## 2023-08-06 DIAGNOSIS — J020 Streptococcal pharyngitis: Secondary | ICD-10-CM

## 2023-08-06 LAB — POCT RAPID STREP A (OFFICE): Rapid Strep A Screen: POSITIVE — AB

## 2023-08-06 MED ORDER — PREDNISONE 20 MG PO TABS: 20.0000 mg | ORAL_TABLET | Freq: Every day | ORAL | 0 refills | Status: DC

## 2023-08-06 MED ORDER — AMOXICILLIN 500 MG PO CAPS: 500.0000 mg | ORAL_CAPSULE | Freq: Two times a day (BID) | ORAL | 0 refills | Status: AC

## 2023-08-06 NOTE — Progress Notes (Signed)
 Established patient visit   Patient: Jillian Reyes   DOB: 07-09-1990   33 y.o. Female  MRN: [id number] Visit Date: 08/06/2023  Today's healthcare provider: Trenton Frock, PA-C   Chief Complaint  Patient presents with   Cough    Last night woke up and felt like inhaled piece of dust; coughing all night long with dry cough; red and painful throat; soreness in ears; no other symptoms   Subjective     Pt reports last night sudden onset of coughing, soreness in ears, and sore throat. Sore throat has persisted through the morning but denies cough. She reports a history of tonsil stones, and thinks she might have one on the left side as it hurts the most here.  Medications: Outpatient Medications Prior to Visit  Medication Sig   acetaminophen  (TYLENOL ) 500 MG tablet Take 1,000 mg by mouth every 6 (six) hours as needed.   albuterol  (VENTOLIN  HFA) 108 (90 Base) MCG/ACT inhaler Inhale 2 puffs into the lungs every 6 (six) hours as needed for wheezing or shortness of breath. (Patient taking differently: Inhale 2 puffs into the lungs every 6 (six) hours as needed for wheezing or shortness of breath.)   alprazolam  (XANAX ) 2 MG tablet Take 1 tablet (2 mg total) by mouth at bedtime as needed. for sleep.   lamoTRIgine  (LAMICTAL ) 200 MG tablet TAKE 1 TABLET (200 MG TOTAL) BY MOUTH DAILY. NEEDS APPT FOR REFILLS   LINZESS 145 MCG CAPS capsule Take 145 mcg by mouth daily before breakfast.   metFORMIN  (GLUCOPHAGE ) 500 MG tablet Take 1 tablet (500 mg total) by mouth daily with breakfast.   metroNIDAZOLE  (METROCREAM ) 0.75 % cream Apply 1 Application topically 2 (two) times daily.   ondansetron  (ZOFRAN -ODT) 8 MG disintegrating tablet Take 1 tablet (8 mg total) by mouth every 8 (eight) hours as needed for nausea or vomiting.   spironolactone  (ALDACTONE ) 50 MG tablet Take 3 tablets (150 mg total) by mouth at bedtime. For acne   tacrolimus (PROTOPIC) 0.1 % ointment Apply 1 Application topically 2 (two)  times daily.   Ubrogepant  (UBRELVY ) 100 MG TABS Take 1 tablet (100 mg total) by mouth as needed. May repeat after 2 hours.  Maximum 2 tablets in 24 hours. (Patient taking differently: Take 1 tablet by mouth as needed. May repeat after 2 hours.  Maximum 2 tablets in 24 hours.)   No facility-administered medications prior to visit.    Review of Systems  Constitutional:  Negative for fatigue and fever.  HENT:  Positive for sore throat.   Respiratory:  Positive for cough. Negative for shortness of breath.   Cardiovascular:  Negative for chest pain and leg swelling.  Gastrointestinal:  Negative for abdominal pain.  Neurological:  Negative for dizziness and headaches.       Objective    BP 100/60   Pulse 63   Temp 98.4 F (36.9 C)   Ht 5\' 4"  (1.626 m)   Wt 163 lb (73.9 kg)   LMP 07/05/2020 (Exact Date)   SpO2 98%   BMI 27.98 kg/m    Physical Exam Constitutional:      General: She is awake.     Appearance: She is well-developed.  HENT:     Head: Normocephalic.     Right Ear: Tympanic membrane normal.     Left Ear: Tympanic membrane normal.     Mouth/Throat:     Comments: Significant erythema, L lateral tonsil may be exudate vs small tonsil stone Eyes:  Conjunctiva/sclera: Conjunctivae normal.  Cardiovascular:     Rate and Rhythm: Normal rate and regular rhythm.     Heart sounds: Normal heart sounds.  Pulmonary:     Effort: Pulmonary effort is normal.     Breath sounds: Normal breath sounds. No wheezing, rhonchi or rales.  Skin:    General: Skin is warm.  Neurological:     Mental Status: She is alert and oriented to person, place, and time.  Psychiatric:        Attention and Perception: Attention normal.        Mood and Affect: Mood normal.        Speech: Speech normal.        Behavior: Behavior is cooperative.      Results for orders placed or performed in visit on 08/06/23  POCT rapid strep A  Result Value Ref Range   Rapid Strep A Screen Positive (A)  Negative    Assessment & Plan    Strep pharyngitis -     Amoxicillin ; Take 1 capsule (500 mg total) by mouth 2 (two) times daily for 10 days.  Dispense: 20 capsule; Refill: 0 -     predniSONE ; Take 1 tablet (20 mg total) by mouth daily with breakfast.  Dispense: 5 tablet; Refill: 0  Acute sore throat -     POCT rapid strep A   Strep positive. Rx amox bid x 10 days, prednisone  x 5 days.  Encourage fluids, tylenol /ibuprofen   Return if symptoms worsen or fail to improve.       Trenton Frock, PA-C  [facility] at [facility] [phone] (phone) [phone] (fax)  [facility]

## 2023-08-07 ENCOUNTER — Other Ambulatory Visit: Payer: Self-pay | Admitting: Physician Assistant

## 2023-08-07 ENCOUNTER — Encounter: Payer: Self-pay | Admitting: Physician Assistant

## 2023-08-07 DIAGNOSIS — B379 Candidiasis, unspecified: Secondary | ICD-10-CM

## 2023-08-07 MED ORDER — FLUCONAZOLE 150 MG PO TABS: 150.0000 mg | ORAL_TABLET | Freq: Once | ORAL | 0 refills | Status: AC

## 2023-08-08 ENCOUNTER — Ambulatory Visit: Admitting: Physical Therapy

## 2023-08-15 ENCOUNTER — Encounter: Admitting: Physical Therapy

## 2023-08-22 ENCOUNTER — Telehealth: Payer: Self-pay | Admitting: Physical Therapy

## 2023-08-22 ENCOUNTER — Ambulatory Visit: Admitting: Physical Therapy

## 2023-08-22 NOTE — Telephone Encounter (Signed)
 Patient informed of missed appt this morning. Due to financial circumstances, patient wishes to cancell all future visits. She was very pleased at her first visit, however, for now she is going to pause therapy. All future appts deleted at this time.  Robbin Chill, PT, DPT 08/22/23 9:26 AM

## 2023-08-23 NOTE — Progress Notes (Deleted)
 NEUROLOGY FOLLOW UP OFFICE NOTE  Jillian Reyes [id number]  Assessment/Plan:   1 Migraine with aura, without status migrainosus, not intractable  2  Chronic tension type headache    Headache prevention:  Nortriptyline  10mg  at bedtime *** Migraine rescue:  Ubrelvy  100mg  *** Limit use of pain relievers to no more than 9 days out of the month to prevent risk of rebound or medication-overuse headache. Keep headache diary Discussed caffeine cessation, sleep hygiene, diet/not to skip meals Follow up ***     Subjective:  Jillian Reyes is a 33 year old right-handed female who follows up for migraine.   UPDATE: In July, discontinued Qulipta  and started nortriptyline  to address tension-type headaches.  Migraines: Intensity:  severe Duration:  2 hours with Ubrelvy   Frequency: once every 2 weeks during the summer/heat, otherwise once a month.  Tension-type headaches: Intensity:  mild Duration:  *** Frequency:  ***   Current NSAIDS/analgesics:  Tylenol , ibuprofen  Current triptans:  none Current ergotamine:  none Current anti-emetic:  Zofran  ODT 4mg  Current muscle relaxants:  none Current Antihypertensive medications:  none Current Antidepressant medications:  nortriptyline  10mg  at bedtime Current Anticonvulsant medications:  lamotrigine  200mg  at bedtime Current anti-CGRP:  Ubrelvy  100mg  Current Vitamins/Herbal/Supplements:  MVI Current Antihistamines/Decongestants:  Flonase  Other therapy:  none Hormone/birth control:  none   Caffeine:  1 cup coffee daily.  Soda once in a while Diet:  Drinks 2 Stanley cups of water  daily  Skips meals Exercise:  No Depression:  yes; Anxiety:  Yes.  Sister recently passed away from a drug overdose.  History of Bipolar disorder. Other pain:  no Sleep hygiene:  Poor.  Gets 3-4 hours of sleep a night.  Works from 5 AM - 2 PM.  Lives an hour away so needs to get up at 3 AM.  Referred to sleep medicine for evaluation of sleep apnea.  Home  sleep study revealed mild sleep apnea.  They recommended conservative management.     HISTORY:  Beginning at age 36 years old, she had headaches.  In 2012 headaches became more severe.  They start in the back of the neck and base of her skull and radiate to front of head bilaterally.  It is throbbing and associated with nausea, photophobia, phonophobia, rarely vomiting.  Sometimes sees stars or dots.  They usually last 1 to  2 days.  They occur once a month to once every other month.  However, she a dull headache 20 days a month.  Sometimes dull headache may be in temples as well.  Triggers include heat, perfumes and cigarette smell.  Rest helps.  Maxalt  ineffective.  Takes Excedrin Migraine daily.   MRI of cervical spine from 1/3/2017was unremarkable.     Past NSAIDS/analgesics:  Excedrin, diclofenac , naproxen , hydrocodone  Past abortive triptans:  sumatriptan  tab, Maxalt  Past abortive ergotamine:  none Past muscle relaxants:  Flexeril , tizanidine  Past anti-emetic:  promethazine  Past antihypertensive medications:  propranolol  Past antidepressant medications:  Duloxetine  (side effects) Past anticonvulsant medications:  Topiramate, Depakote Past anti-CGRP:  Aimovig  140mg  (made her sick), Nurtec PRN, Qulipta  60mg  daily Past vitamins/Herbal/Supplements:  none Past antihistamines/decongestants:  none Other past therapies:  Dry needling (helped)     Family history of headache:  Mom (migraines), father (migraines, brain tumor) She has PCOS.  She has irregular periods.    PAST MEDICAL HISTORY: Past Medical History:  Diagnosis Date   Acne    Bipolar 1 disorder (HCC)    Chronic back pain    Chronic pelvic pain  in female    Chronic tension-type headache, intractable    Dyslipidemia 02/18/2013   Eczema    GAD (generalized anxiety disorder)    History of abnormal cervical Pap smear    History of chlamydia 2015   History of gestational diabetes    Irritable bowel syndrome with constipation     followed by [facility] in [place]   Migraine without aura and without status migrainosus, not intractable    neurologist--- dr Festus Hubert   Mild intermittent asthma    followed by pcp   Mild obstructive sleep apnea    sleep study in epic 05-10-2022,  no cpap recommendation   PCOS (polycystic ovarian syndrome)    Right ovarian cyst    Vitamin D  deficiency    Wears glasses     MEDICATIONS: Current Outpatient Medications on File Prior to Visit  Medication Sig Dispense Refill   acetaminophen  (TYLENOL ) 500 MG tablet Take 1,000 mg by mouth every 6 (six) hours as needed.     albuterol  (VENTOLIN  HFA) 108 (90 Base) MCG/ACT inhaler Inhale 2 puffs into the lungs every 6 (six) hours as needed for wheezing or shortness of breath. (Patient taking differently: Inhale 2 puffs into the lungs every 6 (six) hours as needed for wheezing or shortness of breath.) 18 g 2   alprazolam  (XANAX ) 2 MG tablet Take 1 tablet (2 mg total) by mouth at bedtime as needed. for sleep. 30 tablet 1   lamoTRIgine  (LAMICTAL ) 200 MG tablet TAKE 1 TABLET (200 MG TOTAL) BY MOUTH DAILY. NEEDS APPT FOR REFILLS 90 tablet 0   LINZESS 145 MCG CAPS capsule Take 145 mcg by mouth daily before breakfast.     metFORMIN  (GLUCOPHAGE ) 500 MG tablet Take 1 tablet (500 mg total) by mouth daily with breakfast. 90 tablet 0   metroNIDAZOLE  (METROCREAM ) 0.75 % cream Apply 1 Application topically 2 (two) times daily.     ondansetron  (ZOFRAN -ODT) 8 MG disintegrating tablet Take 1 tablet (8 mg total) by mouth every 8 (eight) hours as needed for nausea or vomiting. 20 tablet 0   predniSONE  (DELTASONE ) 20 MG tablet Take 1 tablet (20 mg total) by mouth daily with breakfast. 5 tablet 0   spironolactone  (ALDACTONE ) 50 MG tablet Take 3 tablets (150 mg total) by mouth at bedtime. For acne 270 tablet 1   tacrolimus (PROTOPIC) 0.1 % ointment Apply 1 Application topically 2 (two) times daily.     Ubrogepant  (UBRELVY ) 100 MG TABS Take 1 tablet (100  mg total) by mouth as needed. May repeat after 2 hours.  Maximum 2 tablets in 24 hours. (Patient taking differently: Take 1 tablet by mouth as needed. May repeat after 2 hours.  Maximum 2 tablets in 24 hours.) 10 tablet 11   No current facility-administered medications on file prior to visit.    ALLERGIES: Allergies  Allergen Reactions   Cymbalta  [Duloxetine  Hcl] Nausea And Vomiting   Nortriptyline  Other (See Comments)    Excessive fatigue    FAMILY HISTORY: Family History  Problem Relation Age of Onset   Heart disease Other    Diabetes Father 66       type 2   Thyroid  disease Father    Brain cancer Father    Cancer Father        tumor of the brain cancer   Migraines Mother    Kidney disease Mother    Colon polyps Mother    Hyperlipidemia Other        great grandparents  Objective:  *** General: No acute distress.  Patient appears well-groomed.   Head:  Normocephalic/atraumatic Neck:  Supple.  No paraspinal tenderness.  Full range of motion. Heart:  Regular rate and rhythm. Neuro:  Alert and oriented.  Speech fluent and not dysarthric.  Language intact.  CN II-XII intact.  Bulk and tone normal.  Muscle strength 5/5 throughout.  Sensation to light touch intact.  Deep tendon reflexes 2+ throughout, toes downgoing.  Gait normal.  Romberg negative.    Janne Members, DO  CC: Randie Bustle, MD

## 2023-08-26 ENCOUNTER — Ambulatory Visit: Payer: Federal, State, Local not specified - PPO | Admitting: Neurology

## 2023-08-29 ENCOUNTER — Ambulatory Visit: Admitting: Physical Therapy

## 2023-09-05 ENCOUNTER — Encounter: Admitting: Physical Therapy

## 2023-09-12 ENCOUNTER — Encounter: Admitting: Physical Therapy

## 2023-09-13 ENCOUNTER — Other Ambulatory Visit: Payer: Self-pay | Admitting: Family Medicine

## 2023-09-13 DIAGNOSIS — Z79899 Other long term (current) drug therapy: Secondary | ICD-10-CM

## 2023-09-13 DIAGNOSIS — F411 Generalized anxiety disorder: Secondary | ICD-10-CM

## 2023-09-13 NOTE — Telephone Encounter (Signed)
 Requesting: alprazolam  2mg   Contract: 11/14/22 UDS: 05/22/23 Last Visit: 08/06/23 w/ Manuelita Next Visit: 10/10/23 Last Refill: 05/07/23 #30 and 1RF   Please Advise

## 2023-10-09 NOTE — Assessment & Plan Note (Signed)
Encouraged increased hydration, 64 ounces of clear fluids daily. Minimize alcohol and caffeine. Eat small frequent meals with lean proteins and complex carbs. Avoid high and low blood sugars. Get adequate sleep, 7-8 hours a night. Needs exercise daily preferably in the morning.  

## 2023-10-09 NOTE — Assessment & Plan Note (Signed)
 Supplement and monitor

## 2023-10-09 NOTE — Assessment & Plan Note (Deleted)
RRR 

## 2023-10-09 NOTE — Assessment & Plan Note (Signed)
 Doing well on current meds.

## 2023-10-09 NOTE — Assessment & Plan Note (Signed)
 Encouraged heart healthy diet, increase exercise, avoid trans fats, consider a krill oil cap daily

## 2023-10-10 ENCOUNTER — Telehealth (INDEPENDENT_AMBULATORY_CARE_PROVIDER_SITE_OTHER): Payer: Federal, State, Local not specified - PPO | Admitting: Family Medicine

## 2023-10-10 DIAGNOSIS — G43009 Migraine without aura, not intractable, without status migrainosus: Secondary | ICD-10-CM | POA: Diagnosis not present

## 2023-10-10 DIAGNOSIS — F319 Bipolar disorder, unspecified: Secondary | ICD-10-CM

## 2023-10-10 DIAGNOSIS — E559 Vitamin D deficiency, unspecified: Secondary | ICD-10-CM

## 2023-10-10 DIAGNOSIS — R000 Tachycardia, unspecified: Secondary | ICD-10-CM

## 2023-10-10 DIAGNOSIS — E785 Hyperlipidemia, unspecified: Secondary | ICD-10-CM

## 2023-10-10 MED ORDER — LINZESS 145 MCG PO CAPS: 145.0000 ug | ORAL_CAPSULE | Freq: Every day | ORAL | 3 refills | Status: AC

## 2023-10-10 MED ORDER — SERTRALINE HCL 50 MG PO TABS: 50.0000 mg | ORAL_TABLET | Freq: Every day | ORAL | 3 refills | Status: DC

## 2023-10-10 MED ORDER — LAMOTRIGINE 200 MG PO TABS: 200.0000 mg | ORAL_TABLET | Freq: Every day | ORAL | 3 refills | Status: DC

## 2023-10-13 ENCOUNTER — Encounter: Payer: Self-pay | Admitting: Family Medicine

## 2023-10-13 NOTE — Progress Notes (Signed)
 MyChart Video Visit    Virtual Visit via Video Note   This patient is at least at moderate risk for complications without adequate follow up. This format is felt to be most appropriate for this patient at this time. Physical exam was limited by quality of the video and audio technology used for the visit. Porsha, CMA was able to get the patient set up on a video visit.  Patient location: Work Patient and provider in visit Provider location: Office  I discussed the limitations of evaluation and management by telemedicine and the availability of in person appointments. The patient expressed understanding and agreed to proceed.  Visit Date: 10/10/2023  Today's healthcare provider: Harlene Horton, MD     Subjective:    Patient ID: Jillian Reyes, female    DOB: [birth year], 33 y.o.   MRN: [id number]  Chief Complaint  Patient presents with   Medical Management of Chronic Issues    Patient presents today for a 5 month follow-up.   Quality Metric Gaps    Hep B, HPV and pneumococcal vaccines    HPI Discussed the use of AI scribe software for clinical note transcription with the patient, who gave verbal consent to proceed.  History of Present Illness Jillian Reyes is a 33 year old female who presents for medication management and refills.  She is experiencing a 'wave of depression' characterized by fluctuating moods, feeling happy and then sad, and a lack of desire to communicate with her husband. This has been ongoing for about three months, coinciding with a rough patch in her marriage. She describes her condition as 'functioning depression,' where she is able to work but feels like she is just going through the motions. She has tried various medications in the past, including duloxetine , which made her nauseous and worsened her condition, leading her to discontinue it. She is currently on lamotrigine .  She is managing migraines with Ubrelvy , which she finds effective. She  recently had a prescription filled on October 02, 2023, with 11 refills, but it expired on the same date.  For gastrointestinal issues, she is taking Linzess  145 mcg daily, which she finds effective. She typically receives a 30-day supply with a copay of $40.    Past Medical History:  Diagnosis Date   Acne    Bipolar 1 disorder (HCC)    Chronic back pain    Chronic pelvic pain in female    Chronic tension-type headache, intractable    Dyslipidemia 02/18/2013   Eczema    GAD (generalized anxiety disorder)    History of abnormal cervical Pap smear    History of chlamydia 2015   History of gestational diabetes    Irritable bowel syndrome with constipation    followed by [facility] in [place]   Migraine without aura and without status migrainosus, not intractable    neurologist--- dr skeet   Mild intermittent asthma    followed by pcp   Mild obstructive sleep apnea    sleep study in epic 05-10-2022,  no cpap recommendation   PCOS (polycystic ovarian syndrome)    Right ovarian cyst    Vitamin D  deficiency    Wears glasses     Past Surgical History:  Procedure Laterality Date   BREAST REDUCTION SURGERY Bilateral 01/05/2021   Procedure: BREAST REDUCTION WITH LIPOSUCTION;  Surgeon: Lowery Estefana RAMAN, DO;  Location: [facility];  Service: Plastics;  Laterality: Bilateral;   CYSTOSCOPY N/A 04/09/2023   Procedure: CYSTOSCOPY;  Surgeon: Cleotilde Ronal RAMAN, MD;  Location[facility];  Service: Gynecology;  Laterality: N/A;   HYSTEROSCOPY WITH NOVASURE N/A 07/06/2020   Procedure: HYSTEROSCOPY WITH NOVASURE ABLATION, DILITATION AND CURETTAGE;  Surgeon: Cleotilde Ronal RAMAN, MD;  Location: [facility];  Service: Gynecology;  Laterality: N/A;   INCISION AND DRAINAGE Right 01/12/2019   Procedure: INCISION AND DRAINAGE OF LESION;  Surgeon: Cleotilde Ronal RAMAN, MD;  Location: [facility];  Service: Gynecology;  Laterality: Right;    KNEE ARTHROSCOPY Right 04-16-2019  @[facility]    LAPAROSCOPY N/A 01/12/2019   Procedure: LAPAROSCOPY DIAGNOSTIC; LYSIS OF ADHESIONS;  Surgeon: Cleotilde Ronal RAMAN, MD;  Location: [facility];  Service: Gynecology;  Laterality: N/A;  possible treatement of endometriosis   TOTAL LAPAROSCOPIC HYSTERECTOMY WITH BILATERAL SALPINGO OOPHORECTOMY Bilateral 04/09/2023   Procedure: TOTAL LAPAROSCOPIC HYSTERECTOMY WITH SALPINGECTOMY;  Surgeon: Cleotilde Ronal RAMAN, MD;  Location: [facility];  Service: Gynecology;  Laterality: Bilateral;   WISDOM TOOTH EXTRACTION  2016    Family History  Problem Relation Age of Onset   Heart disease Other    Diabetes Father 57       type 2   Thyroid  disease Father    Brain cancer Father    Cancer Father        tumor of the brain cancer   Migraines Mother    Kidney disease Mother    Colon polyps Mother    Hyperlipidemia Other        great grandparents    Social History   Socioeconomic History   Marital status: Married    Spouse name: Not on file   Number of children: 1   Years of education: Not on file   Highest education level: Not on file  Occupational History   Occupation: clerck     Comment: [employer]  Tobacco Use   Smoking status: Former    Current packs/day: 0.00    Types: Cigarettes    Start date: 06/28/2005    Quit date: 06/29/2018    Years since quitting: 5.2   Smokeless tobacco: Never   Tobacco comments:    Nicotine gum occassionally  Vaping Use   Vaping status: Former   Devices: quit 2021  Substance and Sexual Activity   Alcohol use: Not Currently   Drug use: Never   Sexual activity: Not Currently    Partners: Male    Comment: husband had vasectomy   Other Topics Concern   Not on file  Social History Narrative   Are you right handed or left handed? 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   Who lives with you? married    What type of home do you live in: 1 story or 2 story? 2 story home      Social Drivers of Corporate investment banker Strain: Not on file  Food Insecurity: Not on file  Transportation Needs: Not on file  Physical Activity: Not on file  Stress: Not on file  Social Connections: Unknown (07/17/2021)   [facility]   Social Network    Social Network: Not on file  Intimate Partner Violence: Unknown (06/22/2021)   Received from [facility]   HITS    Physically Hurt: Not on file    Insult or Talk Down To: Not on file  Threaten Physical Harm: Not on file    Scream or Curse: Not on file    Outpatient Medications Prior to Visit  Medication Sig Dispense Refill   acetaminophen  (TYLENOL ) 500 MG tablet Take 1,000 mg by mouth every 6 (six) hours as needed.     albuterol  (VENTOLIN  HFA) 108 (90 Base) MCG/ACT inhaler Inhale 2 puffs into the lungs every 6 (six) hours as needed for wheezing or shortness of breath. (Patient taking differently: Inhale 2 puffs into the lungs every 6 (six) hours as needed for wheezing or shortness of breath.) 18 g 2   alprazolam  (XANAX ) 2 MG tablet TAKE 1 TABLET (2 MG TOTAL) BY MOUTH AT BEDTIME AS NEEDED. FOR SLEEP. 30 tablet 1   metFORMIN  (GLUCOPHAGE ) 500 MG tablet Take 1 tablet (500 mg total) by mouth daily with breakfast. 90 tablet 0   metroNIDAZOLE  (METROCREAM ) 0.75 % cream Apply 1 Application topically 2 (two) times daily.     ondansetron  (ZOFRAN -ODT) 8 MG disintegrating tablet Take 1 tablet (8 mg total) by mouth every 8 (eight) hours as needed for nausea or vomiting. 20 tablet 0   predniSONE  (DELTASONE ) 20 MG tablet Take 1 tablet (20 mg total) by mouth daily with breakfast. 5 tablet 0   spironolactone  (ALDACTONE ) 50 MG tablet Take 3 tablets (150 mg total) by mouth at bedtime. For acne 270 tablet 1   tacrolimus (PROTOPIC) 0.1 % ointment Apply 1 Application topically 2 (two) times daily.     Ubrogepant  (UBRELVY ) 100 MG TABS Take 1 tablet (100 mg  total) by mouth as needed. May repeat after 2 hours.  Maximum 2 tablets in 24 hours. (Patient taking differently: Take 1 tablet by mouth as needed. May repeat after 2 hours.  Maximum 2 tablets in 24 hours.) 10 tablet 11   lamoTRIgine  (LAMICTAL ) 200 MG tablet TAKE 1 TABLET (200 MG TOTAL) BY MOUTH DAILY. NEEDS APPT FOR REFILLS 90 tablet 0   LINZESS  145 MCG CAPS capsule Take 145 mcg by mouth daily before breakfast.     No facility-administered medications prior to visit.    Allergies  Allergen Reactions   Cymbalta  [Duloxetine  Hcl] Nausea And Vomiting   Nortriptyline  Other (See Comments)    Excessive fatigue    Review of Systems  Constitutional:  Negative for fever and malaise/fatigue.  HENT:  Negative for congestion.   Eyes:  Negative for blurred vision.  Respiratory:  Negative for shortness of breath.   Cardiovascular:  Negative for chest pain, palpitations and leg swelling.  Gastrointestinal:  Positive for constipation. Negative for abdominal pain, blood in stool and nausea.  Genitourinary:  Negative for dysuria and frequency.  Musculoskeletal:  Negative for falls.  Skin:  Negative for rash.  Neurological:  Positive for headaches. Negative for dizziness and loss of consciousness.  Endo/Heme/Allergies:  Negative for environmental allergies.  Psychiatric/Behavioral:  Positive for depression. Negative for hallucinations and substance abuse. The patient is nervous/anxious.        Objective:    Physical Exam Constitutional:      General: She is not in acute distress.    Appearance: Normal appearance. She is not ill-appearing or toxic-appearing.  HENT:     Head: Normocephalic and atraumatic.     Right Ear: External ear normal.     Left Ear: External ear normal.     Nose: Nose normal.  Eyes:     General:        Right eye: No discharge.        Left eye: No  discharge.  Pulmonary:     Effort: Pulmonary effort is normal.  Skin:    Findings: No rash.  Neurological:     Mental  Status: She is alert and oriented to person, place, and time.  Psychiatric:        Behavior: Behavior normal.     LMP 07/05/2020 (Exact Date)  Wt Readings from Last 3 Encounters:  08/06/23 163 lb (73.9 kg)  05/14/23 162 lb 9.6 oz (73.8 kg)  05/14/23 160 lb (72.6 kg)       Assessment & Plan:  Dyslipidemia Assessment & Plan: Encouraged heart healthy diet, increase exercise, avoid trans fats, consider a krill oil cap daily  Orders: -     Comprehensive metabolic panel with GFR; Future -     CBC with Differential/Platelet; Future -     TSH; Future -     Lipid panel; Future  Vitamin D  deficiency Assessment & Plan: Supplement and monitor   Orders: -     VITAMIN D  25 Hydroxy (Vit-D Deficiency, Fractures); Future  Tachycardia  Migraine without aura and without status migrainosus, not intractable Assessment & Plan: Encouraged increased hydration, 64 ounces of clear fluids daily. Minimize alcohol and caffeine. Eat small frequent meals with lean proteins and complex carbs. Avoid high and low blood sugars. Get adequate sleep, 7-8 hours a night. Needs exercise daily preferably in the morning.   Bipolar 1 disorder (HCC) Assessment & Plan: Doing well on current meds  Orders: -     lamoTRIgine ; Take 1 tablet (200 mg total) by mouth daily. NEEDS APPT FOR REFILLS  Dispense: 30 tablet; Refill: 3  Other orders -     Sertraline  HCl; Take 1 tablet (50 mg total) by mouth daily.  Dispense: 30 tablet; Refill: 3 -     Linzess ; Take 1 capsule (145 mcg total) by mouth daily before breakfast.  Dispense: 30 capsule; Refill: 3     Assessment and Plan Assessment & Plan Bipolar I Disorder Experiencing a depressive episode with mood swings. Previous medications not tolerated. Discussed mood stabilizers and antidepressants. Chose sertraline  for compatibility with lamotrigine . - Prescribe sertraline  50 mg tablets. Take half a tablet daily for the first week or two, then increase to a full tablet  as tolerated. - Continue lamotrigine  200 mg oral daily. - Consider psychiatric consultation if symptoms do not improve.  Chronic Constipation Well-managed with Linzess . Prefers 30-day supply due to cost. - Prescribe Linzess  145 mcg oral daily before breakfast. [facility].  Migraine Ubrogepant  effective but insurance approval challenging. Willing to attempt prescription takeover. - Notify when a refill of ubrogepant  is needed for prescription takeover.     I discussed the assessment and treatment plan with the patient. The patient was provided an opportunity to ask questions and all were answered. The patient agreed with the plan and demonstrated an understanding of the instructions.   The patient was advised to call back or seek an in-person evaluation if the symptoms worsen or if the condition fails to improve as anticipated.  Harlene Horton, MD [facility] at [facility] [phone] (phone) [phone] (fax)  [facility]

## 2023-10-17 ENCOUNTER — Other Ambulatory Visit (INDEPENDENT_AMBULATORY_CARE_PROVIDER_SITE_OTHER)

## 2023-10-17 ENCOUNTER — Ambulatory Visit: Payer: Self-pay | Admitting: Family Medicine

## 2023-10-17 DIAGNOSIS — E559 Vitamin D deficiency, unspecified: Secondary | ICD-10-CM

## 2023-10-17 DIAGNOSIS — E871 Hypo-osmolality and hyponatremia: Secondary | ICD-10-CM

## 2023-10-17 DIAGNOSIS — E785 Hyperlipidemia, unspecified: Secondary | ICD-10-CM | POA: Diagnosis not present

## 2023-10-17 DIAGNOSIS — R739 Hyperglycemia, unspecified: Secondary | ICD-10-CM

## 2023-10-17 LAB — CBC WITH DIFFERENTIAL/PLATELET
Basophils Absolute: 0 K/uL (ref 0.0–0.1)
Basophils Relative: 0.4 % (ref 0.0–3.0)
Eosinophils Absolute: 0.1 K/uL (ref 0.0–0.7)
Eosinophils Relative: 1.6 % (ref 0.0–5.0)
HCT: 43.2 % (ref 36.0–46.0)
Hemoglobin: 14.5 g/dL (ref 12.0–15.0)
Lymphocytes Relative: 34.7 % (ref 12.0–46.0)
Lymphs Abs: 2.3 K/uL (ref 0.7–4.0)
MCHC: 33.6 g/dL (ref 30.0–36.0)
MCV: 92.6 fl (ref 78.0–100.0)
Monocytes Absolute: 0.5 K/uL (ref 0.1–1.0)
Monocytes Relative: 7.7 % (ref 3.0–12.0)
Neutro Abs: 3.8 K/uL (ref 1.4–7.7)
Neutrophils Relative %: 55.6 % (ref 43.0–77.0)
Platelets: 316 K/uL (ref 150.0–400.0)
RBC: 4.67 Mil/uL (ref 3.87–5.11)
RDW: 13.3 % (ref 11.5–15.5)
WBC: 6.8 K/uL (ref 4.0–10.5)

## 2023-10-17 LAB — LIPID PANEL
Cholesterol: 149 mg/dL (ref 0–200)
HDL: 51.9 mg/dL (ref >39.00)
LDL Cholesterol: 82 mg/dL (ref 0–99)
NonHDL: 97.57
Total CHOL/HDL Ratio: 3
Triglycerides: 78 mg/dL (ref 0.0–149.0)
VLDL: 15.6 mg/dL (ref 0.0–40.0)

## 2023-10-17 LAB — COMPREHENSIVE METABOLIC PANEL WITH GFR
ALT: 17 U/L (ref 0–35)
AST: 17 U/L (ref 0–37)
Albumin: 4.9 g/dL (ref 3.5–5.2)
Alkaline Phosphatase: 58 U/L (ref 39–117)
BUN: 14 mg/dL (ref 6–23)
CO2: 28 meq/L (ref 19–32)
Calcium: 9.6 mg/dL (ref 8.4–10.5)
Chloride: 98 meq/L (ref 96–112)
Creatinine, Ser: 1.04 mg/dL (ref 0.40–1.20)
GFR: 70.38 mL/min (ref >60.00)
Glucose, Bld: 106 mg/dL — ABNORMAL HIGH (ref 70–99)
Potassium: 4.4 meq/L (ref 3.5–5.1)
Sodium: 133 meq/L — ABNORMAL LOW (ref 135–145)
Total Bilirubin: 0.8 mg/dL (ref 0.2–1.2)
Total Protein: 7.6 g/dL (ref 6.0–8.3)

## 2023-10-17 LAB — TSH: TSH: 1.55 u[IU]/mL (ref 0.35–5.50)

## 2023-10-17 LAB — VITAMIN D 25 HYDROXY (VIT D DEFICIENCY, FRACTURES): VITD: 26.27 ng/mL — ABNORMAL LOW (ref 30.00–100.00)

## 2023-10-18 ENCOUNTER — Other Ambulatory Visit (INDEPENDENT_AMBULATORY_CARE_PROVIDER_SITE_OTHER)

## 2023-10-18 DIAGNOSIS — R739 Hyperglycemia, unspecified: Secondary | ICD-10-CM | POA: Diagnosis not present

## 2023-10-18 LAB — HEMOGLOBIN A1C: Hgb A1c MFr Bld: 5.2 % (ref 4.6–6.5)

## 2023-10-20 ENCOUNTER — Ambulatory Visit: Payer: Self-pay | Admitting: Family Medicine

## 2023-10-26 ENCOUNTER — Other Ambulatory Visit (HOSPITAL_BASED_OUTPATIENT_CLINIC_OR_DEPARTMENT_OTHER): Payer: Self-pay | Admitting: Obstetrics & Gynecology

## 2023-10-26 DIAGNOSIS — E282 Polycystic ovarian syndrome: Secondary | ICD-10-CM

## 2023-10-31 ENCOUNTER — Other Ambulatory Visit (HOSPITAL_COMMUNITY): Payer: Self-pay

## 2023-10-31 ENCOUNTER — Telehealth: Payer: Self-pay

## 2023-10-31 NOTE — Telephone Encounter (Signed)
 Pharmacy Patient Advocate Encounter   Received notification from CoverMyMeds that prior authorization for Linzess  capsules IS DUE FOR RENEWAL.   NEXT AVAILABLE FILL DATE [id number] LAST FILL DT [id number]   Insurance verification completed.   The patient is insured through [facility].  Action: PA required and submitted KEY/EOC/Request #: [id number]

## 2023-10-31 NOTE — Telephone Encounter (Signed)
 Pharmacy Patient Advocate Encounter  Received notification from [facility] that Prior Authorization for Linzess  capsules  has been APPROVED from 10/01/2023 to 10/30/2024   PA #/Case ID/Reference #: [id number]

## 2023-11-01 ENCOUNTER — Other Ambulatory Visit: Payer: Self-pay | Admitting: Neurology

## 2023-11-02 ENCOUNTER — Other Ambulatory Visit: Payer: Self-pay | Admitting: Family Medicine

## 2023-11-13 ENCOUNTER — Encounter: Payer: Self-pay | Admitting: Neurology

## 2023-11-19 ENCOUNTER — Encounter: Payer: Self-pay | Admitting: Family Medicine

## 2023-11-19 ENCOUNTER — Other Ambulatory Visit: Payer: Self-pay | Admitting: Family Medicine

## 2023-11-21 ENCOUNTER — Other Ambulatory Visit: Payer: Self-pay | Admitting: Family Medicine

## 2023-11-27 ENCOUNTER — Other Ambulatory Visit (HOSPITAL_COMMUNITY): Payer: Self-pay

## 2023-12-10 DIAGNOSIS — Z888 Allergy status to other drugs, medicaments and biological substances status: Secondary | ICD-10-CM | POA: Diagnosis not present

## 2023-12-10 DIAGNOSIS — R1032 Left lower quadrant pain: Secondary | ICD-10-CM | POA: Diagnosis not present

## 2023-12-10 DIAGNOSIS — K648 Other hemorrhoids: Secondary | ICD-10-CM | POA: Diagnosis not present

## 2023-12-10 DIAGNOSIS — Z87891 Personal history of nicotine dependence: Secondary | ICD-10-CM | POA: Diagnosis not present

## 2023-12-10 DIAGNOSIS — K6289 Other specified diseases of anus and rectum: Secondary | ICD-10-CM | POA: Diagnosis not present

## 2023-12-10 DIAGNOSIS — K760 Fatty (change of) liver, not elsewhere classified: Secondary | ICD-10-CM | POA: Diagnosis not present

## 2023-12-10 DIAGNOSIS — K6389 Other specified diseases of intestine: Secondary | ICD-10-CM | POA: Diagnosis not present

## 2023-12-10 DIAGNOSIS — K625 Hemorrhage of anus and rectum: Secondary | ICD-10-CM | POA: Diagnosis not present

## 2023-12-10 DIAGNOSIS — Z9049 Acquired absence of other specified parts of digestive tract: Secondary | ICD-10-CM | POA: Diagnosis not present

## 2023-12-10 DIAGNOSIS — K529 Noninfective gastroenteritis and colitis, unspecified: Secondary | ICD-10-CM | POA: Diagnosis not present

## 2023-12-10 DIAGNOSIS — R109 Unspecified abdominal pain: Secondary | ICD-10-CM | POA: Diagnosis not present

## 2023-12-10 DIAGNOSIS — K644 Residual hemorrhoidal skin tags: Secondary | ICD-10-CM | POA: Diagnosis not present

## 2023-12-11 DIAGNOSIS — R109 Unspecified abdominal pain: Secondary | ICD-10-CM | POA: Diagnosis not present

## 2023-12-12 DIAGNOSIS — R935 Abnormal findings on diagnostic imaging of other abdominal regions, including retroperitoneum: Secondary | ICD-10-CM | POA: Diagnosis not present

## 2023-12-12 DIAGNOSIS — R197 Diarrhea, unspecified: Secondary | ICD-10-CM | POA: Diagnosis not present

## 2023-12-12 DIAGNOSIS — K5900 Constipation, unspecified: Secondary | ICD-10-CM | POA: Diagnosis not present

## 2023-12-12 DIAGNOSIS — K625 Hemorrhage of anus and rectum: Secondary | ICD-10-CM | POA: Diagnosis not present

## 2023-12-13 DIAGNOSIS — K5900 Constipation, unspecified: Secondary | ICD-10-CM | POA: Diagnosis not present

## 2023-12-13 DIAGNOSIS — R197 Diarrhea, unspecified: Secondary | ICD-10-CM | POA: Diagnosis not present

## 2023-12-13 DIAGNOSIS — R935 Abnormal findings on diagnostic imaging of other abdominal regions, including retroperitoneum: Secondary | ICD-10-CM | POA: Diagnosis not present

## 2023-12-13 DIAGNOSIS — K625 Hemorrhage of anus and rectum: Secondary | ICD-10-CM | POA: Diagnosis not present

## 2023-12-18 ENCOUNTER — Other Ambulatory Visit: Payer: Self-pay | Admitting: Family Medicine

## 2023-12-18 DIAGNOSIS — F411 Generalized anxiety disorder: Secondary | ICD-10-CM

## 2023-12-18 DIAGNOSIS — Z79899 Other long term (current) drug therapy: Secondary | ICD-10-CM

## 2023-12-18 NOTE — Telephone Encounter (Signed)
 Requesting: alprazolam  2mg   Contract:11/14/22 UDS: 05/22/23 Last Visit: 08/06/23 w/ Manuelita Next Visit:02/17/24 Last Refill: 09/14/23 #30 and 1RF   Please Advise

## 2023-12-31 ENCOUNTER — Other Ambulatory Visit: Payer: Self-pay | Admitting: Family Medicine

## 2024-01-02 ENCOUNTER — Encounter: Payer: Self-pay | Admitting: Family Medicine

## 2024-01-13 ENCOUNTER — Ambulatory Visit: Admitting: Emergency Medicine

## 2024-01-13 ENCOUNTER — Encounter: Payer: Self-pay | Admitting: Emergency Medicine

## 2024-01-13 VITALS — BP 109/72 | HR 84 | Temp 98.7°F | Resp 16 | Ht 64.0 in | Wt 170.0 lb

## 2024-01-13 DIAGNOSIS — N390 Urinary tract infection, site not specified: Secondary | ICD-10-CM | POA: Diagnosis not present

## 2024-01-13 LAB — POCT URINALYSIS DIPSTICK
Bilirubin, UA: NEGATIVE
Glucose, UA: NEGATIVE
Ketones, UA: NEGATIVE
Protein, UA: NEGATIVE
Spec Grav, UA: 1.025 (ref 1.010–1.025)
Urobilinogen, UA: 0.2 U/dL
pH, UA: 5 (ref 5.0–8.0)

## 2024-01-13 MED ORDER — NITROFURANTOIN MONOHYD MACRO 100 MG PO CAPS: 100.0000 mg | ORAL_CAPSULE | Freq: Two times a day (BID) | ORAL | 0 refills | Status: AC

## 2024-01-13 MED ORDER — FLUCONAZOLE 150 MG PO TABS: 150.0000 mg | ORAL_TABLET | Freq: Once | ORAL | 0 refills | Status: AC

## 2024-01-13 MED ORDER — PHENAZOPYRIDINE HCL 200 MG PO TABS: 200.0000 mg | ORAL_TABLET | Freq: Three times a day (TID) | ORAL | 0 refills | Status: AC | PRN

## 2024-01-13 NOTE — Progress Notes (Signed)
 Assessment & Plan:   1. Acute UTI (Primary) - POCT urinalysis dipstick - Urine Culture - nitrofurantoin, macrocrystal-monohydrate, (MACROBID) 100 MG capsule; Take 1 capsule (100 mg total) by mouth 2 (two) times daily for 7 days.  Dispense: 14 capsule; Refill: 0 - fluconazole  (DIFLUCAN ) 150 MG tablet; Take 1 tablet (150 mg total) by mouth once for 1 dose.  Dispense: 1 tablet; Refill: 0 - phenazopyridine (PYRIDIUM) 200 MG tablet; Take 1 tablet (200 mg total) by mouth 3 (three) times daily as needed for pain.  Dispense: 10 tablet; Refill: 0     Results for orders placed or performed in visit on 01/13/24  POCT urinalysis dipstick  Result Value Ref Range   Color, UA     Clarity, UA     Glucose, UA Negative Negative   Bilirubin, UA neg    Ketones, UA neg    Spec Grav, UA 1.025 1.010 - 1.025   Blood, UA +++    pH, UA 5.0 5.0 - 8.0   Protein, UA Negative Negative   Urobilinogen, UA 0.2 0.2 or 1.0 E.U./dL   Nitrite, UA +    Leukocytes, UA Small (1+) (A) Negative   Appearance     Odor      Urine dipstick as above shows positive for WBCs, positive for RBCs, and positive for nitrates.  No sign at this time of complicated UTI/pyelonephritis Push hydration, avoid holding urine, avoid bladder irritants.   Urgent recheck for acute fever, flank pain, emesis, or other new concerns   Corean Geralds, MSPAS, PA-C     Subjective:  HPI:   Jillian Reyes is a 33 y.o. female who complains of dysuria starting today, rapidly worsening.  Patient also complains of hematuria, lower abdominal pain, and lower back pain, urinary frequency. Patient denies fever, emesis.  Patient does not have a history of recurrent UTI.       ROS: Pertinent components reviewed in HPI and are otherwise negative unless specifically indicated above.   Relevant past medical history reviewed and updated as indicated.   Allergies and medications reviewed and updated.   Current Outpatient Medications:     acetaminophen  (TYLENOL ) 500 MG tablet, Take 1,000 mg by mouth every 6 (six) hours as needed., Disp: , Rfl:    albuterol  (VENTOLIN  HFA) 108 (90 Base) MCG/ACT inhaler, Inhale 2 puffs into the lungs every 6 (six) hours as needed for wheezing or shortness of breath., Disp: 18 g, Rfl: 2   alprazolam  (XANAX ) 2 MG tablet, TAKE 1 TABLET (2 MG TOTAL) BY MOUTH AT BEDTIME AS NEEDED. FOR SLEEP., Disp: 30 tablet, Rfl: 1   fluconazole  (DIFLUCAN ) 150 MG tablet, Take 1 tablet (150 mg total) by mouth once for 1 dose., Disp: 1 tablet, Rfl: 0   lamoTRIgine  (LAMICTAL ) 200 MG tablet, Take 1 tablet (200 mg total) by mouth daily. NEEDS APPT FOR REFILLS, Disp: 30 tablet, Rfl: 3   LINZESS  145 MCG CAPS capsule, Take 1 capsule (145 mcg total) by mouth daily before breakfast., Disp: 30 capsule, Rfl: 3   metFORMIN  (GLUCOPHAGE ) 500 MG tablet, TAKE 1 TABLET BY MOUTH EVERY DAY WITH BREAKFAST, Disp: 90 tablet, Rfl: 0   nitrofurantoin, macrocrystal-monohydrate, (MACROBID) 100 MG capsule, Take 1 capsule (100 mg total) by mouth 2 (two) times daily for 7 days., Disp: 14 capsule, Rfl: 0   ondansetron  (ZOFRAN -ODT) 8 MG disintegrating tablet, Take 1 tablet (8 mg total) by mouth every 8 (eight) hours as needed for nausea or vomiting., Disp: 20 tablet, Rfl: 0  phenazopyridine (PYRIDIUM) 200 MG tablet, Take 1 tablet (200 mg total) by mouth 3 (three) times daily as needed for pain., Disp: 10 tablet, Rfl: 0   spironolactone  (ALDACTONE ) 50 MG tablet, Take 3 tablets (150 mg total) by mouth at bedtime. For acne, Disp: 270 tablet, Rfl: 1   tacrolimus (PROTOPIC) 0.1 % ointment, Apply 1 Application topically 2 (two) times daily., Disp: , Rfl:    UBRELVY  100 MG TABS, TAKE 1 TABLET (100 MG TOTAL) BY MOUTH AS NEEDED. MAY REPEAT AFTER 2 HOURS. MAXIMUM 2 TABLETS IN 24 HOURS., Disp: 10 tablet, Rfl: 11   metroNIDAZOLE  (METROCREAM ) 0.75 % cream, Apply 1 Application topically 2 (two) times daily. (Patient not taking: Reported on 01/13/2024), Disp: , Rfl:     predniSONE  (DELTASONE ) 20 MG tablet, Take 1 tablet (20 mg total) by mouth daily with breakfast. (Patient not taking: Reported on 01/13/2024), Disp: 5 tablet, Rfl: 0   sertraline  (ZOLOFT ) 50 MG tablet, TAKE 1 TABLET BY MOUTH EVERY DAY (Patient not taking: Reported on 01/13/2024), Disp: 90 tablet, Rfl: 2  Allergies  Allergen Reactions   Cymbalta  [Duloxetine  Hcl] Nausea And Vomiting   Nortriptyline  Other (See Comments)    Excessive fatigue             Objective:   Vitals:   01/13/24 1443  BP: 109/72  Pulse: 84  Temp: 98.7 F (37.1 C)  Resp: 16  Height: 5' 4 (1.626 m)  Weight: 170 lb (77.1 kg)  BMI (Calculated): 29.17     General: Appears well, in no acute distress. VS as above.  Head: Oral mucosa moist  CV: RRR  Pulm: Normal respiratory effort and excursion  Abd: soft, suprapubic tenderness, no guarding, normal bowel sounds. No CVAT, +mild TTP bilateral lower back.  Gait normal

## 2024-01-13 NOTE — Patient Instructions (Signed)
 Take the antibiotics as directed.   Use the pyridium  (over the counter is AZO for pain/phenazopyridine ) to help with symptoms.   Avoid intake of bladder irritants, if you do consume them- these include alcohol, caffeine, or carbonated beverages like soda or seltzer water  You should be seen again if symptoms persist after treatment, sooner if you develop worsening symptoms to include fever, back pain, abdominal pain, nausea, or vomiting.

## 2024-01-15 ENCOUNTER — Ambulatory Visit: Payer: Self-pay | Admitting: Emergency Medicine

## 2024-01-15 LAB — URINE CULTURE
MICRO NUMBER:: 17151391
SPECIMEN QUALITY:: ADEQUATE

## 2024-01-17 ENCOUNTER — Encounter: Payer: Self-pay | Admitting: Student

## 2024-01-17 ENCOUNTER — Ambulatory Visit: Admitting: Student

## 2024-01-17 ENCOUNTER — Ambulatory Visit: Payer: Self-pay

## 2024-01-17 ENCOUNTER — Other Ambulatory Visit

## 2024-01-17 VITALS — BP 97/64 | HR 74 | Temp 97.9°F | Ht 64.0 in | Wt 170.0 lb

## 2024-01-17 DIAGNOSIS — L739 Follicular disorder, unspecified: Secondary | ICD-10-CM | POA: Diagnosis not present

## 2024-01-17 MED ORDER — DOXYCYCLINE HYCLATE 100 MG PO CAPS: 100.0000 mg | ORAL_CAPSULE | Freq: Two times a day (BID) | ORAL | 0 refills | Status: AC

## 2024-01-17 NOTE — Telephone Encounter (Signed)
 FYI Only or Action Required?: FYI only for provider: appointment scheduled on 01/17/2024 at 11:20am at PCP office with Harlene Jolly NP.  Patient was last seen in primary care on 01/13/2024 by Waddell Krabbe, PA-C.  Called Nurse Triage reporting Abscess.  Symptoms began yesterday.  Interventions attempted: Nothing.  Symptoms are: gradually worsening.  Triage Disposition: See HCP Within 4 Hours (Or PCP Triage)  Patient/caregiver understands and will follow disposition?: Yes             Copied from CRM #[id number]. Topic: Clinical - Red Word Triage >> Jan 17, 2024  8:18 AM Harlene ORN wrote: Red Word that prompted transfer to Nurse Triage: ingrown hair in pubic area/ causing a lot of pain and swelling especially when sitting Reason for Disposition  [1] SEVERE vaginal pain AND [2] not improved 2 hours after pain medicine  Answer Assessment - Initial Assessment Questions 01/13/2024 seen in office for UTI Patient states that she believes she has an ingrown hair between her vagina and rectal area---started last night Painful when sitting Patient states that she shaves and she believes this is what it is---feels like a grape sized area  Patient is advised to call us  back if anything changes or with any further questions/concerns. Patient is advised that if anything worsens to go to the Emergency Room. Patient verbalized understanding.    1. SYMPTOM: What's the main symptom you're concerned about? (e.g., pain, itching, dryness)     Ain, possible cyst or abscess 2. LOCATION: Where is the  possible cyst or abscess located? (e.g., inside/outside, left/right)     Between vagina and rectal area 3. ONSET: When did the  possible cyst or abscess  start?     yesterday 4. PAIN: Is there any pain? If Yes, ask: How bad is it? (Scale: 1-10; mild, moderate, severe)     Uncomfortable when sitting 5. ITCHING: Is there any itching? If Yes, ask: How bad is it? (Scale: 1-10;  mild, moderate, severe)     no 6. CAUSE: What do you think is causing the discharge? Have you had the same problem before? What happened then?     N/a 7. OTHER SYMPTOMS: Do you have any other symptoms? (e.g., fever, itching, vaginal bleeding, pain with urination, injury to genital area, vaginal foreign body)     Getting over UTI 8. PREGNANCY: Is there any chance you are pregnant? When was your last menstrual period?     No---hysterectomy  Protocols used: Vaginal Symptoms-A-AH

## 2024-01-17 NOTE — Progress Notes (Signed)
   Acute Office Visit  Subjective:     Patient ID: Jillian Reyes, female    DOB: [birth year], 33 y.o.   MRN: [id number]  No chief complaint on file.   HPI   History of Present Illness Jillian Reyes is a 33 year old female who presents for acute visit. She recently was Dx with a urinary tract infection and now has discomfort in perianal area. Describes a cyst like area, hard, tender, painful with ambulation. Denies drainage and bleeding. She shaves in this area, concerned this may have caused this.   Symptoms of a urinary tract infection began suddenly on Monday. She is currently taking Macrobid, has 1 more day of treatment left for UTI. Denies urinary symptoms today.   On Thursday, discomfort from an ingrown hair between the buttocks and vagina started. She has been applying warn compresses since yesterday. She has experienced similar issues related to shaving in the past, though it has been a long time since the last occurrence. She previously underwent laser hair removal and had not experienced problems until now.  Patient denies fever, chills, SOB, CP, palpitations, dyspnea, edema, HA, vision changes, N/V/D, abdominal pain, urinary symptoms, rash, weight changes, and recent illness or hospitalizations.    ROS  See HPI    Objective:    BP 97/64   Pulse 74   Temp 97.9 F (36.6 C)   Ht 5' 4 (1.626 m)   Wt 170 lb (77.1 kg)   LMP 07/05/2020 (Exact Date)   SpO2 98%   BMI 29.18 kg/m    Physical Exam  General: No acute distress. Awake and conversant.  Respiratory: CTAB. Respirations are non-labored. No wheezing.  Skin: Warm. dry Psych: Alert and oriented. Cooperative, Appropriate mood and affect, Normal judgment.  CV: RRR. No murmur. No lower extremity edema.  MSK: Normal ambulation. No clubbing or cyanosis.  GU: Small, cyst-like lesion noted in the perianal region with mild erythema and no drainage, fluctuance, or induration. TWP.  Neuro:  CN II-XII grossly normal.     No results found for any visits on 01/17/24.      Assessment & Plan:   Problem List Items Addressed This Visit   None Visit Diagnoses       Folliculitis    -  Primary   Relevant Medications   doxycycline  (VIBRAMYCIN ) 100 MG capsule      Folliculitis of perineal region Acute folliculitis likely due to shaving. Avoid shaving area until healed. Advise using caution when shaving in the perianal area and maintaining proper hygiene to prevent future irritation/infection. - Rx- doxycycline . - Recommended zinc  cream (Desetin OTC) to aid healing and reduce friction. - Suggested witch hazel pads,Epsom salt soaks  - RTC if symptoms persist or worsen.      Meds ordered this encounter  Medications   doxycycline  (VIBRAMYCIN ) 100 MG capsule    Sig: Take 1 capsule (100 mg total) by mouth 2 (two) times daily for 10 days.    Dispense:  20 capsule    Refill:  0    Supervising Provider:   DOMENICA BLACKBIRD A [[id number]]    No follow-ups on file.  Donda Friedli L Eain Mullendore, NP

## 2024-02-01 ENCOUNTER — Other Ambulatory Visit: Payer: Self-pay | Admitting: Family Medicine

## 2024-02-04 ENCOUNTER — Telehealth: Payer: Self-pay

## 2024-02-04 ENCOUNTER — Other Ambulatory Visit (HOSPITAL_COMMUNITY): Payer: Self-pay

## 2024-02-04 NOTE — Telephone Encounter (Signed)
 Pharmacy Patient Advocate Encounter   Received notification from Pt Calls Messages that prior authorization for Ubrelvy  100mg  tabs is required/requested.   Insurance verification completed.   The patient is insured through [employer]   Waiting on insurance to fax over PA form

## 2024-02-05 ENCOUNTER — Other Ambulatory Visit (HOSPITAL_COMMUNITY): Payer: Self-pay

## 2024-02-05 NOTE — Telephone Encounter (Signed)
 Faxed completed PA form to [employer].   Fax# [phone] Phone# [phone]  Case ID: [id number]

## 2024-02-05 NOTE — Telephone Encounter (Signed)
 Pharmacy Patient Advocate Encounter  Received notification from [facility] FEP that Prior Authorization for Ubrelvy  100mg  tabs has been APPROVED from 02/05/24 to 02/04/25   PA #/Case ID/Reference #: [id number]  Approval letter indexed to media tab

## 2024-02-05 NOTE — Telephone Encounter (Signed)
 PA has been submitted, and telephone encounter has been created. Please see telephone encounter dated 11.18.25.

## 2024-02-16 NOTE — Assessment & Plan Note (Signed)
 Supplement and monitor

## 2024-02-16 NOTE — Progress Notes (Unsigned)
 Subjective:    Patient ID: Adrien DELENA Conch, female    DOB: 12-25-1990, 33 y.o.   MRN: [id number]  No chief complaint on file.   HPI Discussed the use of AI scribe software for clinical note transcription with the patient, who gave verbal consent to proceed.  History of Present Illness     Past Medical History:  Diagnosis Date  . Acne   . Bipolar 1 disorder (HCC)   . Chronic back pain   . Chronic pelvic pain in female   . Chronic tension-type headache, intractable   . Dyslipidemia 02/18/2013  . Eczema   . GAD (generalized anxiety disorder)   . History of abnormal cervical Pap smear   . History of chlamydia 2015  . History of gestational diabetes   . Irritable bowel syndrome with constipation    followed by [facility] in [place]  . Migraine without aura and without status migrainosus, not intractable    neurologist--- dr skeet  . Mild intermittent asthma    followed by pcp  . Mild obstructive sleep apnea    sleep study in epic 05-10-2022,  no cpap recommendation  . PCOS (polycystic ovarian syndrome)   . Right ovarian cyst   . Vitamin D  deficiency   . Wears glasses     Past Surgical History:  Procedure Laterality Date  . BREAST REDUCTION SURGERY Bilateral 01/05/2021   Procedure: BREAST REDUCTION WITH LIPOSUCTION;  Surgeon: Lowery Estefana RAMAN, DO;  Location: [facility];  Service: Plastics;  Laterality: Bilateral;  . CYSTOSCOPY N/A 04/09/2023   Procedure: CYSTOSCOPY;  Surgeon: Cleotilde Ronal RAMAN, MD;  Location: [facility];  Service: Gynecology;  Laterality: N/A;  . HYSTEROSCOPY WITH NOVASURE N/A 07/06/2020   Procedure: HYSTEROSCOPY WITH NOVASURE ABLATION, DILITATION AND CURETTAGE;  Surgeon: Cleotilde Ronal RAMAN, MD;  Location: [facility] [place];  Service: Gynecology;  Laterality: N/A;  . INCISION AND DRAINAGE Right 01/12/2019   Procedure: INCISION AND DRAINAGE OF LESION;  Surgeon: Cleotilde Ronal RAMAN, MD;  Location: [facility]  [place];  Service: Gynecology;  Laterality: Right;  . KNEE ARTHROSCOPY Right 04-16-2019  @[facility]   . LAPAROSCOPY N/A 01/12/2019   Procedure: LAPAROSCOPY DIAGNOSTIC; LYSIS OF ADHESIONS;  Surgeon: Cleotilde Ronal RAMAN, MD;  Location: [facility];  Service: Gynecology;  Laterality: N/A;  possible treatement of endometriosis  . TOTAL LAPAROSCOPIC HYSTERECTOMY WITH BILATERAL SALPINGO OOPHORECTOMY Bilateral 04/09/2023   Procedure: TOTAL LAPAROSCOPIC HYSTERECTOMY WITH SALPINGECTOMY;  Surgeon: Cleotilde Ronal RAMAN, MD;  Location: [facility];  Service: Gynecology;  Laterality: Bilateral;  . WISDOM TOOTH EXTRACTION  2016    Family History  Problem Relation Age of Onset  . Heart disease Other   . Diabetes Father 30       type 2  . Thyroid  disease Father   . Brain cancer Father   . Cancer Father        tumor of the brain cancer  . Migraines Mother   . Kidney disease Mother   . Colon polyps Mother   . Hyperlipidemia Other        great grandparents    Social History   Socioeconomic History  . Marital status: Married    Spouse name: Not on file  . Number of children: 1  . Years of education: Not on file  . Highest education level: Not on file  Occupational History  . Occupation: clerck     Comment: [employer]  Tobacco Use  . Smoking status:  Former    Current packs/day: 0.00    Types: Cigarettes    Start date: 06/28/2005    Quit date: 06/29/2018    Years since quitting: 5.6  . Smokeless tobacco: Never  . Tobacco comments:    Nicotine gum occassionally  Vaping Use  . Vaping status: Former  . Devices: quit 2021  Substance and Sexual Activity  . Alcohol use: Not Currently  . Drug use: Never  . Sexual activity: Not Currently    Partners: Male    Comment: husband had vasectomy   Other Topics Concern  . Not on file  Social History Narrative   Are you right handed or left handed? 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   Who lives with you? married   What type of home do you live in: 1 story or 2 story? 2 story home      Social Drivers of Corporate Investment Banker Strain: Not on file  Food Insecurity: Not on file  Transportation Needs: Not on file  Physical Activity: Not on file  Stress: Not on file  Social Connections: Not on file  Intimate Partner Violence: Not At Risk (12/10/2023)   Received from [facility]   HITS   . Over the last 12 months how often did your partner physically hurt you?: Never   . Over the last 12 months how often did your partner insult you or talk down to you?: Never   . Over the last 12 months how often did your partner threaten you with physical harm?: Never   . Over the last 12 months how often did your partner scream or curse at you?: Never    Outpatient Medications Prior to Visit  Medication Sig Dispense Refill  . acetaminophen  (TYLENOL ) 500 MG tablet Take 1,000 mg by mouth every 6 (six) hours as needed.    . albuterol  (VENTOLIN  HFA) 108 (90 Base) MCG/ACT inhaler Inhale 2 puffs into the lungs every 6 (six) hours as needed for wheezing or shortness of breath. 18 g 2  . alprazolam  (XANAX ) 2 MG tablet TAKE 1 TABLET (2 MG TOTAL) BY MOUTH AT BEDTIME AS NEEDED. FOR SLEEP. 30 tablet 1  . lamoTRIgine  (LAMICTAL ) 200 MG tablet Take 1 tablet (200 mg total) by mouth daily. NEEDS APPT FOR REFILLS 30 tablet 3  . LINZESS  145 MCG CAPS capsule Take 1 capsule (145 mcg total) by mouth daily before breakfast. 30 capsule 3  . metFORMIN  (GLUCOPHAGE ) 500 MG tablet TAKE 1 TABLET BY MOUTH EVERY DAY WITH BREAKFAST 90 tablet 0  . metroNIDAZOLE  (METROCREAM ) 0.75 % cream Apply 1 Application topically 2 (two) times daily. (Patient not taking: Reported on 01/13/2024)    . ondansetron  (ZOFRAN -ODT) 8 MG disintegrating tablet Take 1 tablet (8 mg total) by mouth every 8 (eight) hours as needed for nausea or vomiting. 20 tablet 0  .  phenazopyridine  (PYRIDIUM ) 200 MG tablet Take 1 tablet (200 mg total) by mouth 3 (three) times daily as needed for pain. 10 tablet 0  . predniSONE  (DELTASONE ) 20 MG tablet Take 1 tablet (20 mg total) by mouth daily with breakfast. (Patient not taking: Reported on 01/13/2024) 5 tablet 0  . sertraline  (ZOLOFT ) 50 MG tablet TAKE 1 TABLET BY MOUTH EVERY DAY (Patient not taking: Reported on 01/13/2024) 90 tablet 2  . spironolactone  (ALDACTONE ) 50 MG tablet  Take 3 tablets (150 mg total) by mouth at bedtime. For acne 270 tablet 1  . tacrolimus (PROTOPIC) 0.1 % ointment Apply 1 Application topically 2 (two) times daily.    . UBRELVY  100 MG TABS TAKE 1 TABLET (100 MG TOTAL) BY MOUTH AS NEEDED. MAY REPEAT AFTER 2 HOURS. MAXIMUM 2 TABLETS IN 24 HOURS. 10 tablet 11   No facility-administered medications prior to visit.    Allergies  Allergen Reactions  . Cymbalta  [Duloxetine  Hcl] Nausea And Vomiting  . Nortriptyline  Other (See Comments)    Excessive fatigue    Review of Systems  Constitutional:  Negative for fever and malaise/fatigue.  HENT:  Negative for congestion.   Eyes:  Negative for blurred vision.  Respiratory:  Negative for shortness of breath.   Cardiovascular:  Negative for chest pain, palpitations and leg swelling.  Gastrointestinal:  Negative for abdominal pain, blood in stool and nausea.  Genitourinary:  Negative for dysuria and frequency.  Musculoskeletal:  Negative for falls.  Skin:  Negative for rash.  Neurological:  Negative for dizziness, loss of consciousness and headaches.  Endo/Heme/Allergies:  Negative for environmental allergies.  Psychiatric/Behavioral:  Negative for depression. The patient is not nervous/anxious.        Objective:    Physical Exam Constitutional:      General: She is not in acute distress.    Appearance: Normal appearance. She is well-developed. She is not toxic-appearing.  HENT:     Head: Normocephalic and atraumatic.     Right Ear: External ear  normal.     Left Ear: External ear normal.     Nose: Nose normal.  Eyes:     General:        Right eye: No discharge.        Left eye: No discharge.     Conjunctiva/sclera: Conjunctivae normal.  Neck:     Thyroid : No thyromegaly.  Cardiovascular:     Rate and Rhythm: Normal rate and regular rhythm.     Heart sounds: Normal heart sounds. No murmur heard. Pulmonary:     Effort: Pulmonary effort is normal. No respiratory distress.     Breath sounds: Normal breath sounds.  Abdominal:     General: Bowel sounds are normal.     Palpations: Abdomen is soft.     Tenderness: There is no abdominal tenderness. There is no guarding.  Musculoskeletal:        General: Normal range of motion.     Cervical back: Neck supple.  Lymphadenopathy:     Cervical: No cervical adenopathy.  Skin:    General: Skin is warm and dry.  Neurological:     Mental Status: She is alert and oriented to person, place, and time.  Psychiatric:        Mood and Affect: Mood normal.        Behavior: Behavior normal.        Thought Content: Thought content normal.        Judgment: Judgment normal.    LMP 07/05/2020 (Exact Date)  Wt Readings from Last 3 Encounters:  01/17/24 170 lb (77.1 kg)  01/13/24 170 lb (77.1 kg)  08/06/23 163 lb (73.9 kg)    Diabetic Foot Exam - Simple   No data filed    Lab Results  Component Value Date   WBC 6.8 10/17/2023   HGB 14.5 10/17/2023   HCT 43.2 10/17/2023   PLT 316.0 10/17/2023   GLUCOSE 106 (H) 10/17/2023   CHOL 149 10/17/2023  TRIG 78.0 10/17/2023   HDL 51.90 10/17/2023   LDLCALC 82 10/17/2023   ALT 17 10/17/2023   AST 17 10/17/2023   NA 133 (L) 10/17/2023   K 4.4 10/17/2023   CL 98 10/17/2023   CREATININE 1.04 10/17/2023   BUN 14 10/17/2023   CO2 28 10/17/2023   TSH 1.55 10/17/2023   HGBA1C 5.2 10/18/2023    Lab Results  Component Value Date   TSH 1.55 10/17/2023   Lab Results  Component Value Date   WBC 6.8 10/17/2023   HGB 14.5 10/17/2023    HCT 43.2 10/17/2023   MCV 92.6 10/17/2023   PLT 316.0 10/17/2023   Lab Results  Component Value Date   NA 133 (L) 10/17/2023   K 4.4 10/17/2023   CO2 28 10/17/2023   GLUCOSE 106 (H) 10/17/2023   BUN 14 10/17/2023   CREATININE 1.04 10/17/2023   BILITOT 0.8 10/17/2023   ALKPHOS 58 10/17/2023   AST 17 10/17/2023   ALT 17 10/17/2023   PROT 7.6 10/17/2023   ALBUMIN 4.9 10/17/2023   CALCIUM 9.6 10/17/2023   ANIONGAP 8 01/21/2023   GFR 70.38 10/17/2023   Lab Results  Component Value Date   CHOL 149 10/17/2023   Lab Results  Component Value Date   HDL 51.90 10/17/2023   Lab Results  Component Value Date   LDLCALC 82 10/17/2023   Lab Results  Component Value Date   TRIG 78.0 10/17/2023   Lab Results  Component Value Date   CHOLHDL 3 10/17/2023   Lab Results  Component Value Date   HGBA1C 5.2 10/18/2023       Assessment & Plan:  Vitamin D  deficiency Assessment & Plan: Supplement and monitor    Dyslipidemia Assessment & Plan: Encouraged heart healthy diet, increase exercise, avoid trans fats, consider a krill oil cap daily   Bipolar 1 disorder (HCC) Assessment & Plan: Following with Behavioral Health     Assessment and Plan Assessment & Plan      Harlene Horton, MD

## 2024-02-16 NOTE — Assessment & Plan Note (Signed)
 Following with Behavioral Health

## 2024-02-16 NOTE — Assessment & Plan Note (Signed)
 Encouraged heart healthy diet, increase exercise, avoid trans fats, consider a krill oil cap daily

## 2024-02-17 ENCOUNTER — Ambulatory Visit: Admitting: Family Medicine

## 2024-02-17 ENCOUNTER — Telehealth: Admitting: Family Medicine

## 2024-02-17 DIAGNOSIS — E785 Hyperlipidemia, unspecified: Secondary | ICD-10-CM

## 2024-02-17 DIAGNOSIS — G43009 Migraine without aura, not intractable, without status migrainosus: Secondary | ICD-10-CM

## 2024-02-17 DIAGNOSIS — E559 Vitamin D deficiency, unspecified: Secondary | ICD-10-CM | POA: Diagnosis not present

## 2024-02-17 DIAGNOSIS — F319 Bipolar disorder, unspecified: Secondary | ICD-10-CM | POA: Diagnosis not present

## 2024-02-19 ENCOUNTER — Encounter: Payer: Self-pay | Admitting: Family Medicine

## 2024-02-19 DIAGNOSIS — K5730 Diverticulosis of large intestine without perforation or abscess without bleeding: Secondary | ICD-10-CM | POA: Diagnosis not present

## 2024-02-19 DIAGNOSIS — K921 Melena: Secondary | ICD-10-CM | POA: Diagnosis not present

## 2024-02-19 DIAGNOSIS — K648 Other hemorrhoids: Secondary | ICD-10-CM | POA: Diagnosis not present

## 2024-02-19 NOTE — Assessment & Plan Note (Signed)
 Encouraged increased hydration, 64 ounces of clear fluids daily. Minimize alcohol and caffeine. Eat small frequent meals with lean proteins and complex carbs. Avoid high and low blood sugars. Get adequate sleep, 7-8 hours a night. Needs exercise daily preferably in the morning.

## 2024-02-29 ENCOUNTER — Other Ambulatory Visit: Payer: Self-pay | Admitting: Family Medicine

## 2024-02-29 DIAGNOSIS — F319 Bipolar disorder, unspecified: Secondary | ICD-10-CM

## 2024-03-01 NOTE — Assessment & Plan Note (Signed)
 Patient encouraged to maintain heart healthy diet, regular exercise, adequate sleep. Consider daily probiotics. Take medications as prescribed Pap 04/2021 s/p partial hysterectomy with b/l ovaries still present, no further paps required

## 2024-03-01 NOTE — Progress Notes (Unsigned)
 Subjective:    Patient ID: Jillian Reyes, female    DOB: 1990/03/28, 33 y.o.   MRN: [id number]  No chief complaint on file.   HPI Discussed the use of AI scribe software for clinical note transcription with the patient, who gave verbal consent to proceed.  History of Present Illness     Past Medical History:  Diagnosis Date   Acne    Bipolar 1 disorder (HCC)    Chronic back pain    Chronic pelvic pain in female    Chronic tension-type headache, intractable    Dyslipidemia 02/18/2013   Eczema    GAD (generalized anxiety disorder)    History of abnormal cervical Pap smear    History of chlamydia 2015   History of gestational diabetes    Irritable bowel syndrome with constipation    followed by [facility] in [place]   Migraine without aura and without status migrainosus, not intractable    neurologist--- dr skeet   Mild intermittent asthma    followed by pcp   Mild obstructive sleep apnea    sleep study in epic 05-10-2022,  no cpap recommendation   PCOS (polycystic ovarian syndrome)    Right ovarian cyst    Vitamin D  deficiency    Wears glasses     Past Surgical History:  Procedure Laterality Date   BREAST REDUCTION SURGERY Bilateral 01/05/2021   Procedure: BREAST REDUCTION WITH LIPOSUCTION;  Surgeon: Lowery Estefana RAMAN, DO;  Location: [facility];  Service: Plastics;  Laterality: Bilateral;   CYSTOSCOPY N/A 04/09/2023   Procedure: CYSTOSCOPY;  Surgeon: Cleotilde Ronal RAMAN, MD;  Location: [facility];  Service: Gynecology;  Laterality: N/A;   HYSTEROSCOPY WITH NOVASURE N/A 07/06/2020   Procedure: HYSTEROSCOPY WITH NOVASURE ABLATION, DILITATION AND CURETTAGE;  Surgeon: Cleotilde Ronal RAMAN, MD;  Location: [facility] [place];  Service: Gynecology;  Laterality: N/A;   INCISION AND DRAINAGE Right 01/12/2019   Procedure: INCISION AND DRAINAGE OF LESION;  Surgeon: Cleotilde Ronal RAMAN, MD;  Location: [facility]  [place];  Service: Gynecology;  Laterality: Right;   KNEE ARTHROSCOPY Right 04-16-2019  @[facility]    LAPAROSCOPY N/A 01/12/2019   Procedure: LAPAROSCOPY DIAGNOSTIC; LYSIS OF ADHESIONS;  Surgeon: Cleotilde Ronal RAMAN, MD;  Location: [facility] [place];  Service: Gynecology;  Laterality: N/A;  possible treatement of endometriosis   TOTAL LAPAROSCOPIC HYSTERECTOMY WITH BILATERAL SALPINGO OOPHORECTOMY Bilateral 04/09/2023   Procedure: TOTAL LAPAROSCOPIC HYSTERECTOMY WITH SALPINGECTOMY;  Surgeon: Cleotilde Ronal RAMAN, MD;  Location: [facility];  Service: Gynecology;  Laterality: Bilateral;   WISDOM TOOTH EXTRACTION  2016    Family History  Problem Relation Age of Onset   Heart disease Other    Diabetes Father 93       type 2   Thyroid  disease Father    Brain cancer Father    Cancer Father        tumor of the brain cancer   Migraines Mother    Kidney disease Mother    Colon polyps Mother    Hyperlipidemia Other        great grandparents    Social History   Socioeconomic History   Marital status: Married    Spouse name: Not on file   Number of children: 1   Years of education: Not on file   Highest education level: Not on file  Occupational History   Occupation: clerck     Comment: [employer]  Tobacco Use   Smoking status:  Former    Current packs/day: 0.00    Types: Cigarettes    Start date: 06/28/2005    Quit date: 06/29/2018    Years since quitting: 5.6   Smokeless tobacco: Never   Tobacco comments:    Nicotine gum occassionally  Vaping Use   Vaping status: Former   Devices: quit 2021  Substance and Sexual Activity   Alcohol use: Not Currently   Drug use: Never   Sexual activity: Not Currently    Partners: Male    Comment: husband had vasectomy   Other Topics Concern   Not on file  Social History Narrative   Are you right handed or left handed? 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   Who lives with you? married   What type of home do you live in: 1 story or 2 story? 2 story home      Social Drivers of Health   Tobacco Use: Medium Risk (01/17/2024)   Patient History    Smoking Tobacco Use: Former    Smokeless Tobacco Use: Never    Passive Exposure: Not on Actuary Strain: Not on file  Food Insecurity: Not on file  Transportation Needs: Not on file  Physical Activity: Not on file  Stress: Not on file  Social Connections: Not on file  Intimate Partner Violence: Not At Risk (12/10/2023)   Received from [facility]   HITS    Over the last 12 months how often did your partner physically hurt you?: Never    Over the last 12 months how often did your partner insult you or talk down to you?: Never    Over the last 12 months how often did your partner threaten you with physical harm?: Never    Over the last 12 months how often did your partner scream or curse at you?: Never  Depression (PHQ2-9): Low Risk (01/23/2023)   Depression (PHQ2-9)    PHQ-2 Score: 0  Recent Concern: Depression (PHQ2-9) - Medium Risk (11/02/2022)   Depression (PHQ2-9)    PHQ-2 Score: 5  Alcohol Screen: Not on file  Housing: Not on file  Utilities: Not on file  Health Literacy: Not on file    Outpatient Medications Prior to Visit  Medication Sig Dispense Refill   acetaminophen  (TYLENOL ) 500 MG tablet Take 1,000 mg by mouth every 6 (six) hours as needed.     albuterol  (VENTOLIN  HFA) 108 (90 Base) MCG/ACT inhaler Inhale 2 puffs into the lungs every 6 (six) hours as needed for wheezing or shortness of breath. 18 g 2   alprazolam  (XANAX ) 2 MG tablet TAKE 1 TABLET (2 MG TOTAL) BY MOUTH AT BEDTIME AS NEEDED. FOR SLEEP. 30 tablet 1   lamoTRIgine  (LAMICTAL ) 200 MG tablet Take 1 tablet (200 mg total) by mouth daily. NEEDS APPT FOR REFILLS 30 tablet 3   LINZESS  145 MCG CAPS capsule Take 1 capsule (145 mcg  total) by mouth daily before breakfast. 30 capsule 3   metFORMIN  (GLUCOPHAGE ) 500 MG tablet TAKE 1 TABLET BY MOUTH EVERY DAY WITH BREAKFAST 90 tablet 0   metroNIDAZOLE  (METROCREAM ) 0.75 % cream Apply 1 Application topically 2 (two) times daily. (Patient not taking: Reported on 01/13/2024)     ondansetron  (ZOFRAN -ODT) 8 MG disintegrating tablet Take 1 tablet (8 mg total) by mouth every 8 (eight) hours as needed for  nausea or vomiting. 20 tablet 0   phenazopyridine  (PYRIDIUM ) 200 MG tablet Take 1 tablet (200 mg total) by mouth 3 (three) times daily as needed for pain. 10 tablet 0   spironolactone  (ALDACTONE ) 50 MG tablet Take 3 tablets (150 mg total) by mouth at bedtime. For acne 270 tablet 1   tacrolimus (PROTOPIC) 0.1 % ointment Apply 1 Application topically 2 (two) times daily.     UBRELVY  100 MG TABS TAKE 1 TABLET (100 MG TOTAL) BY MOUTH AS NEEDED. MAY REPEAT AFTER 2 HOURS. MAXIMUM 2 TABLETS IN 24 HOURS. 10 tablet 11   No facility-administered medications prior to visit.    Allergies[1]  Review of Systems  Constitutional:  Negative for chills, fever and malaise/fatigue.  HENT:  Negative for congestion and hearing loss.   Eyes:  Negative for discharge.  Respiratory:  Negative for cough, sputum production and shortness of breath.   Cardiovascular:  Negative for chest pain, palpitations and leg swelling.  Gastrointestinal:  Negative for abdominal pain, blood in stool, constipation, diarrhea, heartburn, nausea and vomiting.  Genitourinary:  Negative for dysuria, frequency, hematuria and urgency.  Musculoskeletal:  Negative for back pain, falls and myalgias.  Skin:  Negative for rash.  Neurological:  Negative for dizziness, sensory change, loss of consciousness, weakness and headaches.  Endo/Heme/Allergies:  Negative for environmental allergies. Does not bruise/bleed easily.  Psychiatric/Behavioral:  Negative for depression and suicidal ideas. The patient is not nervous/anxious and does  not have insomnia.        Objective:    Physical Exam Constitutional:      General: She is not in acute distress.    Appearance: Normal appearance. She is well-developed. She is not toxic-appearing.  HENT:     Head: Normocephalic and atraumatic.     Right Ear: External ear normal.     Left Ear: External ear normal.     Nose: Nose normal.  Eyes:     General:        Right eye: No discharge.        Left eye: No discharge.     Conjunctiva/sclera: Conjunctivae normal.  Neck:     Thyroid : No thyromegaly.  Cardiovascular:     Rate and Rhythm: Normal rate and regular rhythm.     Heart sounds: Normal heart sounds. No murmur heard. Pulmonary:     Effort: Pulmonary effort is normal. No respiratory distress.     Breath sounds: Normal breath sounds.  Abdominal:     General: Bowel sounds are normal.     Palpations: Abdomen is soft.     Tenderness: There is no abdominal tenderness. There is no guarding.  Musculoskeletal:        General: Normal range of motion.     Cervical back: Neck supple.  Lymphadenopathy:     Cervical: No cervical adenopathy.  Skin:    General: Skin is warm and dry.  Neurological:     Mental Status: She is alert and oriented to person, place, and time.  Psychiatric:        Mood and Affect: Mood normal.        Behavior: Behavior normal.        Thought Content: Thought content normal.        Judgment: Judgment normal.    LMP 07/05/2020  Wt Readings from Last 3 Encounters:  01/17/24 170 lb (77.1 kg)  01/13/24 170 lb (77.1 kg)  08/06/23 163 lb (73.9 kg)    Diabetic Foot Exam - Simple  No data filed    Lab Results  Component Value Date   WBC 6.8 10/17/2023   HGB 14.5 10/17/2023   HCT 43.2 10/17/2023   PLT 316.0 10/17/2023   GLUCOSE 106 (H) 10/17/2023   CHOL 149 10/17/2023   TRIG 78.0 10/17/2023   HDL 51.90 10/17/2023   LDLCALC 82 10/17/2023   ALT 17 10/17/2023   AST 17 10/17/2023   NA 133 (L) 10/17/2023   K 4.4 10/17/2023   CL 98 10/17/2023    CREATININE 1.04 10/17/2023   BUN 14 10/17/2023   CO2 28 10/17/2023   TSH 1.55 10/17/2023   HGBA1C 5.2 10/18/2023    Lab Results  Component Value Date   TSH 1.55 10/17/2023   Lab Results  Component Value Date   WBC 6.8 10/17/2023   HGB 14.5 10/17/2023   HCT 43.2 10/17/2023   MCV 92.6 10/17/2023   PLT 316.0 10/17/2023   Lab Results  Component Value Date   NA 133 (L) 10/17/2023   K 4.4 10/17/2023   CO2 28 10/17/2023   GLUCOSE 106 (H) 10/17/2023   BUN 14 10/17/2023   CREATININE 1.04 10/17/2023   BILITOT 0.8 10/17/2023   ALKPHOS 58 10/17/2023   AST 17 10/17/2023   ALT 17 10/17/2023   PROT 7.6 10/17/2023   ALBUMIN 4.9 10/17/2023   CALCIUM 9.6 10/17/2023   ANIONGAP 8 01/21/2023   GFR 70.38 10/17/2023   Lab Results  Component Value Date   CHOL 149 10/17/2023   Lab Results  Component Value Date   HDL 51.90 10/17/2023   Lab Results  Component Value Date   LDLCALC 82 10/17/2023   Lab Results  Component Value Date   TRIG 78.0 10/17/2023   Lab Results  Component Value Date   CHOLHDL 3 10/17/2023   Lab Results  Component Value Date   HGBA1C 5.2 10/18/2023       Assessment & Plan:  Vitamin D  deficiency Assessment & Plan: Supplement and monitor    Preventative health care Assessment & Plan: Patient encouraged to maintain heart healthy diet, regular exercise, adequate sleep. Consider daily probiotics. Take medications as prescribed Pap 04/2021 repeat in 2026-28   Dyslipidemia Assessment & Plan: Encouraged heart healthy diet, increase exercise, avoid trans fats, consider a krill oil cap daily     Assessment and Plan Assessment & Plan      Harlene Horton, MD     [1] Allergies Allergen Reactions   Cymbalta  [Duloxetine  Hcl] Nausea And Vomiting   Nortriptyline  Other (See Comments)    Excessive fatigue

## 2024-03-01 NOTE — Assessment & Plan Note (Signed)
 Encouraged heart healthy diet, increase exercise, avoid trans fats, consider a krill oil cap daily

## 2024-03-01 NOTE — Assessment & Plan Note (Signed)
 Supplement and monitor

## 2024-03-03 ENCOUNTER — Other Ambulatory Visit: Payer: Self-pay | Admitting: Family Medicine

## 2024-03-03 DIAGNOSIS — Z79899 Other long term (current) drug therapy: Secondary | ICD-10-CM

## 2024-03-03 DIAGNOSIS — F411 Generalized anxiety disorder: Secondary | ICD-10-CM

## 2024-03-04 NOTE — Telephone Encounter (Signed)
 Requesting: alprazolam  2mg  Contract:11/14/22 UDS: 05/22/23 Last Visit: 02/17/24 Next Visit:  03/04/24 Last Refill: 12/18/23 #30 and 1rf   Please Advise

## 2024-03-05 ENCOUNTER — Ambulatory Visit (INDEPENDENT_AMBULATORY_CARE_PROVIDER_SITE_OTHER): Payer: Federal, State, Local not specified - PPO | Admitting: Family Medicine

## 2024-03-05 ENCOUNTER — Encounter: Payer: Self-pay | Admitting: Family Medicine

## 2024-03-05 VITALS — BP 116/62 | HR 76 | Temp 97.8°F | Resp 16 | Ht 64.0 in | Wt 161.8 lb

## 2024-03-05 DIAGNOSIS — Z0000 Encounter for general adult medical examination without abnormal findings: Secondary | ICD-10-CM

## 2024-03-05 DIAGNOSIS — R1012 Left upper quadrant pain: Secondary | ICD-10-CM

## 2024-03-05 DIAGNOSIS — R739 Hyperglycemia, unspecified: Secondary | ICD-10-CM

## 2024-03-05 DIAGNOSIS — E559 Vitamin D deficiency, unspecified: Secondary | ICD-10-CM | POA: Diagnosis not present

## 2024-03-05 DIAGNOSIS — L989 Disorder of the skin and subcutaneous tissue, unspecified: Secondary | ICD-10-CM | POA: Diagnosis not present

## 2024-03-05 DIAGNOSIS — E785 Hyperlipidemia, unspecified: Secondary | ICD-10-CM | POA: Diagnosis not present

## 2024-03-05 DIAGNOSIS — L309 Dermatitis, unspecified: Secondary | ICD-10-CM

## 2024-03-05 NOTE — Patient Instructions (Signed)

## 2024-03-06 ENCOUNTER — Ambulatory Visit: Payer: Self-pay | Admitting: Family Medicine

## 2024-03-06 DIAGNOSIS — E559 Vitamin D deficiency, unspecified: Secondary | ICD-10-CM

## 2024-03-06 LAB — CBC WITH DIFFERENTIAL/PLATELET
Basophils Absolute: 0.1 K/uL (ref 0.0–0.1)
Basophils Relative: 0.6 % (ref 0.0–3.0)
Eosinophils Absolute: 0.1 K/uL (ref 0.0–0.7)
Eosinophils Relative: 0.9 % (ref 0.0–5.0)
HCT: 41.6 % (ref 36.0–46.0)
Hemoglobin: 14.2 g/dL (ref 12.0–15.0)
Lymphocytes Relative: 29.5 % (ref 12.0–46.0)
Lymphs Abs: 2.6 K/uL (ref 0.7–4.0)
MCHC: 34.1 g/dL (ref 30.0–36.0)
MCV: 91.7 fl (ref 78.0–100.0)
Monocytes Absolute: 0.6 K/uL (ref 0.1–1.0)
Monocytes Relative: 7.1 % (ref 3.0–12.0)
Neutro Abs: 5.4 K/uL (ref 1.4–7.7)
Neutrophils Relative %: 61.9 % (ref 43.0–77.0)
Platelets: 313 K/uL (ref 150.0–400.0)
RBC: 4.54 Mil/uL (ref 3.87–5.11)
RDW: 13.1 % (ref 11.5–15.5)
WBC: 8.8 K/uL (ref 4.0–10.5)

## 2024-03-06 LAB — COMPREHENSIVE METABOLIC PANEL WITH GFR
ALT: 11 U/L (ref 3–35)
AST: 12 U/L (ref 5–37)
Albumin: 4.7 g/dL (ref 3.5–5.2)
Alkaline Phosphatase: 51 U/L (ref 39–117)
BUN: 8 mg/dL (ref 6–23)
CO2: 26 meq/L (ref 19–32)
Calcium: 10 mg/dL (ref 8.4–10.5)
Chloride: 101 meq/L (ref 96–112)
Creatinine, Ser: 0.85 mg/dL (ref 0.40–1.20)
GFR: 89.41 mL/min
Glucose, Bld: 78 mg/dL (ref 70–99)
Potassium: 4.6 meq/L (ref 3.5–5.1)
Sodium: 136 meq/L (ref 135–145)
Total Bilirubin: 0.6 mg/dL (ref 0.2–1.2)
Total Protein: 7.1 g/dL (ref 6.0–8.3)

## 2024-03-06 LAB — LIPID PANEL
Cholesterol: 131 mg/dL (ref 28–200)
HDL: 43.3 mg/dL
LDL Cholesterol: 70 mg/dL (ref 10–99)
NonHDL: 87.75
Total CHOL/HDL Ratio: 3
Triglycerides: 88 mg/dL (ref 10.0–149.0)
VLDL: 17.6 mg/dL (ref 0.0–40.0)

## 2024-03-06 LAB — VITAMIN D 25 HYDROXY (VIT D DEFICIENCY, FRACTURES): VITD: 19.75 ng/mL — ABNORMAL LOW (ref 30.00–100.00)

## 2024-03-06 LAB — HEMOGLOBIN A1C: Hgb A1c MFr Bld: 5.1 % (ref 4.6–6.5)

## 2024-03-06 LAB — TSH: TSH: 1.26 u[IU]/mL (ref 0.35–5.50)

## 2024-03-09 MED ORDER — VITAMIN D (ERGOCALCIFEROL) 1.25 MG (50000 UNIT) PO CAPS: 50000.0000 [IU] | ORAL_CAPSULE | ORAL | 4 refills | Status: AC

## 2024-03-09 NOTE — Progress Notes (Signed)
 Patient reviewed via MyChart.

## 2024-03-27 ENCOUNTER — Other Ambulatory Visit: Payer: Self-pay | Admitting: Family Medicine

## 2024-03-27 ENCOUNTER — Ambulatory Visit (HOSPITAL_BASED_OUTPATIENT_CLINIC_OR_DEPARTMENT_OTHER)
Admission: RE | Admit: 2024-03-27 | Discharge: 2024-03-27 | Disposition: A | Source: Ambulatory Visit | Attending: Family Medicine | Admitting: Family Medicine

## 2024-03-27 ENCOUNTER — Ambulatory Visit: Admitting: Medical

## 2024-03-27 ENCOUNTER — Ambulatory Visit: Payer: Self-pay

## 2024-03-27 ENCOUNTER — Encounter: Payer: Self-pay | Admitting: Medical

## 2024-03-27 ENCOUNTER — Ambulatory Visit: Payer: Self-pay | Admitting: Medical

## 2024-03-27 VITALS — BP 108/70 | HR 80 | Temp 98.2°F | Resp 15 | Ht 64.0 in | Wt 157.4 lb

## 2024-03-27 DIAGNOSIS — L309 Dermatitis, unspecified: Secondary | ICD-10-CM

## 2024-03-27 DIAGNOSIS — Z0000 Encounter for general adult medical examination without abnormal findings: Secondary | ICD-10-CM

## 2024-03-27 DIAGNOSIS — E559 Vitamin D deficiency, unspecified: Secondary | ICD-10-CM

## 2024-03-27 DIAGNOSIS — R1012 Left upper quadrant pain: Secondary | ICD-10-CM | POA: Insufficient documentation

## 2024-03-27 DIAGNOSIS — Z87442 Personal history of urinary calculi: Secondary | ICD-10-CM

## 2024-03-27 DIAGNOSIS — R739 Hyperglycemia, unspecified: Secondary | ICD-10-CM

## 2024-03-27 DIAGNOSIS — E785 Hyperlipidemia, unspecified: Secondary | ICD-10-CM

## 2024-03-27 DIAGNOSIS — R10A2 Flank pain, left side: Secondary | ICD-10-CM

## 2024-03-27 DIAGNOSIS — L989 Disorder of the skin and subcutaneous tissue, unspecified: Secondary | ICD-10-CM

## 2024-03-27 LAB — COMPREHENSIVE METABOLIC PANEL WITH GFR
ALT: 13 U/L (ref 3–35)
AST: 15 U/L (ref 5–37)
Albumin: 5.1 g/dL (ref 3.5–5.2)
Alkaline Phosphatase: 53 U/L (ref 39–117)
BUN: 10 mg/dL (ref 6–23)
CO2: 28 meq/L (ref 19–32)
Calcium: 10 mg/dL (ref 8.4–10.5)
Chloride: 101 meq/L (ref 96–112)
Creatinine, Ser: 1.03 mg/dL (ref 0.40–1.20)
GFR: 70.98 mL/min
Glucose, Bld: 89 mg/dL (ref 70–99)
Potassium: 4.5 meq/L (ref 3.5–5.1)
Sodium: 135 meq/L (ref 135–145)
Total Bilirubin: 0.6 mg/dL (ref 0.2–1.2)
Total Protein: 8 g/dL (ref 6.0–8.3)

## 2024-03-27 LAB — POCT URINALYSIS DIPSTICK
Bilirubin, UA: NEGATIVE
Blood, UA: NEGATIVE
Glucose, UA: NEGATIVE
Leukocytes, UA: NEGATIVE
Nitrite, UA: NEGATIVE
Protein, UA: NEGATIVE
Spec Grav, UA: 1.01
Urobilinogen, UA: 0.2 U/dL
pH, UA: 7

## 2024-03-27 LAB — CBC WITH DIFFERENTIAL/PLATELET
Basophils Absolute: 0 K/uL (ref 0.0–0.1)
Basophils Relative: 0.4 % (ref 0.0–3.0)
Eosinophils Absolute: 0.1 K/uL (ref 0.0–0.7)
Eosinophils Relative: 1.4 % (ref 0.0–5.0)
HCT: 43.2 % (ref 36.0–46.0)
Hemoglobin: 14.5 g/dL (ref 12.0–15.0)
Lymphocytes Relative: 35.1 % (ref 12.0–46.0)
Lymphs Abs: 2.3 K/uL (ref 0.7–4.0)
MCHC: 33.5 g/dL (ref 30.0–36.0)
MCV: 91.7 fl (ref 78.0–100.0)
Monocytes Absolute: 0.5 K/uL (ref 0.1–1.0)
Monocytes Relative: 7.5 % (ref 3.0–12.0)
Neutro Abs: 3.6 K/uL (ref 1.4–7.7)
Neutrophils Relative %: 55.6 % (ref 43.0–77.0)
Platelets: 312 K/uL (ref 150.0–400.0)
RBC: 4.71 Mil/uL (ref 3.87–5.11)
RDW: 13.2 % (ref 11.5–15.5)
WBC: 6.6 K/uL (ref 4.0–10.5)

## 2024-03-27 MED ORDER — ONDANSETRON 8 MG PO TBDP: 8.0000 mg | ORAL_TABLET | Freq: Three times a day (TID) | ORAL | 0 refills | Status: AC | PRN

## 2024-03-27 MED ORDER — HYDROCODONE-ACETAMINOPHEN 5-325 MG PO TABS: ORAL_TABLET | ORAL | 0 refills | Status: AC

## 2024-03-27 NOTE — Telephone Encounter (Signed)
 Pt has a scheduled appointment with Edward Saguier,PA-C today, 03/27/24 @ 9:00 AM.

## 2024-03-27 NOTE — Patient Instructions (Signed)
 Left flank pain and left side back pain but not direct cva area. Acute left flank pain with differential diagnosis including nephrolithiasis, muscle pain, and shingles(if more abd pain possible diverticulitis?). No rash observed. Nephrolithiasis less likely due to absence of hematuria, but not ruled out. Consider diverticulitis if CT negative for stones. - Ordered CT scan of abdomen and pelvis with stat priority. Ask ma to call down and change to stat.Notify Dr. Domenica and myself. - Ordered CBC and metabolic panel. stat - Ordered urine culture. - Prescribed Norco 5/325 mg, 20 tablets, one tablet every six hours as needed for severe pain. - Advised hydration. - Consider muscle relaxant if CT negative and pain persists - Consider anti-inflammatory or prednisone  if ibuprofen  ineffective. (if no stone or diverticulitis seen)  History of nephrolithiasis History of nephrolithiasis during pregnancy. Current flank pain raises suspicion, but absence of hematuria makes it less likely. Previous UTI noted. - Monitor for hematuria. - Ensure urine culture to rule out infection related to kidney stones.  Follow up date to be determined after lab and imaging review. Update us  on any signs/symptoms change. If pain worsens/severe despite tx over the weekend then be seen in ED.

## 2024-03-27 NOTE — Telephone Encounter (Signed)
 Called [facility] and advised of ER Refusal

## 2024-03-27 NOTE — Addendum Note (Signed)
 Addended by: GERARD CHUCKIE SAILOR on: 03/27/2024 10:02 AM   Modules accepted: Orders

## 2024-03-27 NOTE — Telephone Encounter (Signed)
 Additional Information  Commented on: Answer Assessment    Imaging order already in from PCP--noted in chart to be an Abdomen/Pelvic CT  Protocols used: Flank Pain-A-AH

## 2024-03-27 NOTE — Progress Notes (Signed)
 "  Subjective:    Patient ID: Jillian Reyes, female    DOB: Aug 02, 1990, 34 y.o.   MRN: [id number]  HPI  Back Pain Says it has happened twice in the past two months yesterday was the most recent occurrence was yesterday thinks it might be kidney stones is in pain now says its about a 4 or 5     Nausea         Chief complaint above  [name] is a 33 year old female who presents with severe back pain.  She has had sharp, throbbing left flank pain for about 6 weeks with three episodes. The current episode began yesterday while she was sitting in bed and has progressively worsened. The pain is severe, radiates slightly to the left side from upper left cva area, is worsened by twisting or getting up from a chair, and persists at rest. There has been no recent injury or fall.  Pt pcp aware of her pains and place ct abd and pelvis weeks ago to evaluate for possibe kidney stones.  She had nausea after showering this morning and still has throbbing pain. She has not noticed a rash.  She had kidney stones during a prior pregnancy and a severe urinary tract infection in September. A CT of the abdomen and pelvis was ordered but not completed.  She is taking ibuprofen  for pain but prefers to avoid pain medications and has not used Tylenol  recently. She has no medication allergies.    Pt had hysterectomy.   Review of Systems  Constitutional:  Negative for chills and fatigue.  Respiratory:  Negative for choking, chest tightness, shortness of breath and wheezing.   Cardiovascular:  Negative for chest pain and palpitations.  Gastrointestinal:  Negative for abdominal pain, constipation, nausea and vomiting.       Flank pain  Musculoskeletal:  Positive for back pain.  Skin:  Negative for rash.  Hematological:  Negative for adenopathy.  Psychiatric/Behavioral:  Negative for behavioral problems and decreased concentration. The patient is not nervous/anxious.       Past Medical  History:  Diagnosis Date   Acne    Bipolar 1 disorder (HCC)    Chronic back pain    Chronic pelvic pain in female    Chronic tension-type headache, intractable    Dyslipidemia 02/18/2013   Eczema    GAD (generalized anxiety disorder)    History of abnormal cervical Pap smear    History of chlamydia 2015   History of gestational diabetes    Irritable bowel syndrome with constipation    followed by [facility] in [place]   Migraine without aura and without status migrainosus, not intractable    neurologist--- dr skeet   Mild intermittent asthma    followed by pcp   Mild obstructive sleep apnea    sleep study in epic 05-10-2022,  no cpap recommendation   PCOS (polycystic ovarian syndrome)    Right ovarian cyst    Vitamin D  deficiency    Wears glasses      Social History   Socioeconomic History   Marital status: Married    Spouse name: Not on file   Number of children: 1   Years of education: Not on file   Highest education level: Not on file  Occupational History   Occupation: clerck     Comment: [employer]  Tobacco Use   Smoking status: Former    Current packs/day: 0.00    Types: Cigarettes  Start date: 06/28/2005    Quit date: 06/29/2018    Years since quitting: 5.7   Smokeless tobacco: Never   Tobacco comments:    Nicotine gum occassionally  Vaping Use   Vaping status: Former   Devices: quit 2021  Substance and Sexual Activity   Alcohol use: Not Currently   Drug use: Never   Sexual activity: Not Currently    Partners: Male    Comment: husband had vasectomy   Other Topics Concern   Not on file  Social History Narrative   Are you right handed or left handed? 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   Who lives with you? married   What type of home do you live in: 1 story or 2 story? 2 story home      Social Drivers of Health   Tobacco  Use: Medium Risk (03/27/2024)   Patient History    Smoking Tobacco Use: Former    Smokeless Tobacco Use: Never    Passive Exposure: Not on Actuary Strain: Not on file  Food Insecurity: Not on file  Transportation Needs: Not on file  Physical Activity: Not on file  Stress: Not on file  Social Connections: Not on file  Intimate Partner Violence: Not At Risk (12/10/2023)   Received from [facility]   HITS    Over the last 12 months how often did your partner physically hurt you?: Never    Over the last 12 months how often did your partner insult you or talk down to you?: Never    Over the last 12 months how often did your partner threaten you with physical harm?: Never    Over the last 12 months how often did your partner scream or curse at you?: Never  Depression (PHQ2-9): Medium Risk (03/05/2024)   Depression (PHQ2-9)    PHQ-2 Score: 8  Alcohol Screen: Not on file  Housing: Not on file  Utilities: Not on file  Health Literacy: Not on file    Past Surgical History:  Procedure Laterality Date   BREAST REDUCTION SURGERY Bilateral 01/05/2021   Procedure: BREAST REDUCTION WITH LIPOSUCTION;  Surgeon: Lowery Estefana RAMAN, DO;  Location: [facility];  Service: Plastics;  Laterality: Bilateral;   CYSTOSCOPY N/A 04/09/2023   Procedure: CYSTOSCOPY;  Surgeon: Cleotilde Ronal RAMAN, MD;  Location: [facility];  Service: Gynecology;  Laterality: N/A;   HYSTEROSCOPY WITH NOVASURE N/A 07/06/2020   Procedure: HYSTEROSCOPY WITH NOVASURE ABLATION, DILITATION AND CURETTAGE;  Surgeon: Cleotilde Ronal RAMAN, MD;  Location: [facility];  Service: Gynecology;  Laterality: N/A;   INCISION AND DRAINAGE Right 01/12/2019   Procedure: INCISION AND DRAINAGE OF LESION;  Surgeon: Cleotilde Ronal RAMAN, MD;  Location: [facility];  Service: Gynecology;  Laterality: Right;   KNEE ARTHROSCOPY Right 04-16-2019  @[facility]    LAPAROSCOPY N/A 01/12/2019   Procedure:  LAPAROSCOPY DIAGNOSTIC; LYSIS OF ADHESIONS;  Surgeon: Cleotilde Ronal RAMAN, MD;  Location: [facility];  Service: Gynecology;  Laterality: N/A;  possible treatement of endometriosis   TOTAL LAPAROSCOPIC HYSTERECTOMY WITH BILATERAL SALPINGO OOPHORECTOMY Bilateral 04/09/2023   Procedure: TOTAL LAPAROSCOPIC HYSTERECTOMY WITH SALPINGECTOMY;  Surgeon: Cleotilde Ronal RAMAN, MD;  Location: [facility];  Service: Gynecology;  Laterality: Bilateral;   WISDOM TOOTH EXTRACTION  2016    Family History  Problem Relation Age of Onset   Heart disease Other    Diabetes Father 58       type 2   Thyroid  disease Father    Brain cancer Father    Cancer Father        tumor of the brain cancer   Migraines Mother    Kidney disease Mother    Colon polyps Mother    Hyperlipidemia Other        great grandparents    Allergies[1]  Medications Ordered Prior to Encounter[2]  BP 108/70   Pulse 80   Temp 98.2 F (36.8 C) (Oral)   Resp 15   Ht 5' 4 (1.626 m)   Wt 157 lb 6.4 oz (71.4 kg)   LMP 07/05/2020   SpO2 99%   BMI 27.02 kg/m            Objective:   Physical Exam  General Mental Status- Alert. General Appearance- Not in acute distress.   Skin General: Color- Normal Color. Moisture- Normal Moisture.  Neck  No JVD.  Chest and Lung Exam Auscultation: Breath Sounds:-CTA  Cardiovascular Auscultation:Rythm- RRR Murmurs & Other Heart Sounds:Auscultation of the heart reveals- No Murmurs.  Abdomen Inspection:-Inspeection Normal. Palpation/Percussion:Note:No mass. Palpation and Percussion of the abdomen reveal- faint left side abd Tender, Non Distended + BS, no rebound or guarding.   Back- mild left upper cva area pain. More paraspinal pain. Level of lower rib  Neurologic Cranial Nerve exam:- CN III-XII intact(No nystagmus), symmetric smile. Strength:- 5/5 equal and symmetric strength both upper and lower extremities.       Assessment & Plan:  Left flank pain  and left side back pain but not direct cva area. Acute left flank pain with differential diagnosis including nephrolithiasis, muscle pain, and shingles(if more abd pain possible diverticulitis?). No rash observed. Nephrolithiasis less likely due to absence of hematuria, but not ruled out. Consider diverticulitis if CT negative for stones. - Ordered CT scan of abdomen and pelvis with stat priority. Ask ma to call down and change to stat.Notify Dr. Domenica and myself. - Ordered CBC and metabolic panel. stat - Ordered urine culture. - Prescribed Norco 5/325 mg, 20 tablets, one tablet every six hours as needed for severe pain. - Advised hydration. - Consider muscle relaxant if CT negative and pain persists - Consider anti-inflammatory or prednisone  if ibuprofen  ineffective. (if no stone or diverticulitis seen)  History of nephrolithiasis History of nephrolithiasis during pregnancy. Current flank pain raises suspicion, but absence of hematuria makes it less likely. Previous UTI noted. - Monitor for hematuria. - Ensure urine culture to rule out infection related to kidney stones.  Follow up date to be determined after lab and imaging review. Update us  on any signs/symptoms change. If pain worsens/severe despite tx over the weekend then be seen in ED.  Pritika Alvarez, PA-C    I personally spent a total of 43 minutes in the care of the patient today including performing a medically appropriate exam/evaluation, counseling and educating, placing orders, and documenting clinical information in the EHR.     [1]  Allergies Allergen Reactions   Cymbalta  [Duloxetine  Hcl] Nausea And Vomiting   Nortriptyline  Other (See Comments)    Excessive fatigue  [2]  Current Outpatient Medications on File Prior to Visit  Medication Sig Dispense Refill   albuterol  (VENTOLIN  HFA) 108 (90 Base) MCG/ACT inhaler Inhale 2 puffs into the lungs every 6 (six) hours as needed for wheezing or shortness of breath. 18 g 2  alprazolam  (XANAX ) 2 MG tablet TAKE 1 TABLET (2 MG TOTAL) BY MOUTH AT BEDTIME AS NEEDED. FOR SLEEP. 30 tablet 1   lamoTRIgine  (LAMICTAL ) 200 MG tablet Take 1 tablet (200 mg total) by mouth daily. 30 tablet 0   LINZESS  145 MCG CAPS capsule Take 1 capsule (145 mcg total) by mouth daily before breakfast. 30 capsule 3   metFORMIN  (GLUCOPHAGE ) 500 MG tablet TAKE 1 TABLET BY MOUTH EVERY DAY WITH BREAKFAST 90 tablet 0   phenazopyridine  (PYRIDIUM ) 200 MG tablet Take 1 tablet (200 mg total) by mouth 3 (three) times daily as needed for pain. 10 tablet 0   spironolactone  (ALDACTONE ) 50 MG tablet Take 3 tablets (150 mg total) by mouth at bedtime. For acne 270 tablet 1   tacrolimus (PROTOPIC) 0.1 % ointment Apply 1 Application topically 2 (two) times daily.     UBRELVY  100 MG TABS TAKE 1 TABLET (100 MG TOTAL) BY MOUTH AS NEEDED. MAY REPEAT AFTER 2 HOURS. MAXIMUM 2 TABLETS IN 24 HOURS. 10 tablet 11   Vitamin D , Ergocalciferol , (DRISDOL ) 1.25 MG (50000 UNIT) CAPS capsule Take 1 capsule (50,000 Units total) by mouth every 7 (seven) days. 4 capsule 4   metroNIDAZOLE  (METROCREAM ) 0.75 % cream Apply 1 Application topically 2 (two) times daily. (Patient not taking: Reported on 03/27/2024)     No current facility-administered medications on file prior to visit.   "

## 2024-03-27 NOTE — Telephone Encounter (Signed)
 FYI Only or Action Required?: Action required by provider: request for appointment, clinical question for provider, update on patient condition, and ER Refusal.  Patient was last seen in primary care on 03/05/2024 by Domenica Harlene LABOR, MD.  Called Nurse Triage reporting Flank Pain.  Symptoms began two episodes of pain in the past month but this pain started yesterday and has been constant.  Interventions attempted: OTC medications: ibuprofen  and Rest, hydration, or home remedies.  Symptoms are: unchanged.  Triage Disposition: Go to ED Now (Notify PCP)  Patient/caregiver understands and will follow disposition?: No, wishes to speak with PCP            Copied from CRM #[id number]. Topic: Clinical - Red Word Triage >> Mar 27, 2024  8:28 AM Donna BRAVO wrote: Red Word that prompted transfer to Nurse Triage:  past month severe pain left side back pain under ribs Continual pain Reason for Disposition  [1] SEVERE pain (e.g., excruciating, scale 8-10) AND [2] not improved after pain medicine  Answer Assessment - Initial Assessment Questions Left middle back/flank pain constant since last night During the past month, there were two incidences in the lower middle part of back would have pain that came on. Pain usually lasts an hour but now it is a continual pain. Pain was unbearable yesterday Patient states that she a kidney stone many years ago Patient denies chest pain, abdominal pain, blood in urine, vomiting but did have some nausea today No chance of pregnancy.  Patient is advised that having the severe pain last night and still having pain today that is constant it is recommended that she goes to the Emergency Room at this time for further evaluation.  Patient did not want to go to the Emergency Room and she advised that she has an order already in from where she had pain in her left upper abdominal pain on her anterior side just opposite of where today's pain is located. She states  she is going to call and see about getting that done.  Patient is advised to call us  back if anything changes or with any further questions/concerns. Patient is advised that if anything worsens to go to the Emergency Room. Patient verbalized understanding.  Protocols used: Flank Pain-A-AH

## 2024-03-28 LAB — URINE CULTURE
MICRO NUMBER:: 17449065
SPECIMEN QUALITY:: ADEQUATE

## 2024-03-29 ENCOUNTER — Other Ambulatory Visit: Payer: Self-pay | Admitting: Family Medicine

## 2024-03-29 ENCOUNTER — Ambulatory Visit: Payer: Self-pay | Admitting: Family Medicine

## 2024-03-29 DIAGNOSIS — F319 Bipolar disorder, unspecified: Secondary | ICD-10-CM

## 2024-03-29 MED ORDER — METHOCARBAMOL 500 MG PO TABS: ORAL_TABLET | ORAL | 0 refills | Status: AC

## 2024-03-29 NOTE — Addendum Note (Signed)
 Addended by: DORINA DALLAS DORINA PA-C M on: 03/29/2024 10:07 AM   Modules accepted: Orders

## 2024-04-01 NOTE — Progress Notes (Signed)
 Tried calling pt and no answer, phone ring then stopped.

## 2024-05-15 ENCOUNTER — Ambulatory Visit (HOSPITAL_BASED_OUTPATIENT_CLINIC_OR_DEPARTMENT_OTHER): Payer: Federal, State, Local not specified - PPO | Admitting: Obstetrics & Gynecology
